# Patient Record
Sex: Female | Born: 1940 | Race: Black or African American | Hispanic: No | Smoking: Former smoker
Health system: Southern US, Community
[De-identification: ages and names within clinical notes are randomized; demographics above are authoritative.]

## PROBLEM LIST (undated history)

## (undated) DIAGNOSIS — I10 Essential (primary) hypertension: Secondary | ICD-10-CM

## (undated) DIAGNOSIS — M109 Gout, unspecified: Secondary | ICD-10-CM

## (undated) DIAGNOSIS — M1990 Unspecified osteoarthritis, unspecified site: Secondary | ICD-10-CM

## (undated) HISTORY — PX: TUBAL LIGATION: SHX77

## (undated) HISTORY — PX: ABDOMINAL HYSTERECTOMY: SHX81

---

## 2000-10-16 ENCOUNTER — Emergency Department (HOSPITAL_COMMUNITY): Admission: EM | Admit: 2000-10-16 | Discharge: 2000-10-16 | Payer: Self-pay | Admitting: Emergency Medicine

## 2000-11-13 ENCOUNTER — Ambulatory Visit (HOSPITAL_COMMUNITY): Admission: RE | Admit: 2000-11-13 | Discharge: 2000-11-13 | Payer: Self-pay | Admitting: Internal Medicine

## 2000-11-13 ENCOUNTER — Encounter: Payer: Self-pay | Admitting: Internal Medicine

## 2000-11-29 ENCOUNTER — Ambulatory Visit (HOSPITAL_COMMUNITY): Admission: RE | Admit: 2000-11-29 | Discharge: 2000-11-29 | Payer: Self-pay | Admitting: Internal Medicine

## 2000-11-29 ENCOUNTER — Encounter: Payer: Self-pay | Admitting: Internal Medicine

## 2000-12-28 ENCOUNTER — Other Ambulatory Visit: Admission: RE | Admit: 2000-12-28 | Discharge: 2000-12-28 | Payer: Self-pay | Admitting: Urology

## 2000-12-29 ENCOUNTER — Emergency Department (HOSPITAL_COMMUNITY): Admission: EM | Admit: 2000-12-29 | Discharge: 2000-12-29 | Payer: Self-pay | Admitting: *Deleted

## 2000-12-31 ENCOUNTER — Encounter: Payer: Self-pay | Admitting: Urology

## 2000-12-31 ENCOUNTER — Ambulatory Visit (HOSPITAL_COMMUNITY): Admission: RE | Admit: 2000-12-31 | Discharge: 2000-12-31 | Payer: Self-pay | Admitting: Urology

## 2001-01-19 ENCOUNTER — Emergency Department (HOSPITAL_COMMUNITY): Admission: EM | Admit: 2001-01-19 | Discharge: 2001-01-19 | Payer: Self-pay | Admitting: Emergency Medicine

## 2001-03-07 ENCOUNTER — Emergency Department (HOSPITAL_COMMUNITY): Admission: EM | Admit: 2001-03-07 | Discharge: 2001-03-07 | Payer: Self-pay | Admitting: Emergency Medicine

## 2001-04-18 ENCOUNTER — Emergency Department (HOSPITAL_COMMUNITY): Admission: EM | Admit: 2001-04-18 | Discharge: 2001-04-19 | Payer: Self-pay | Admitting: *Deleted

## 2001-04-19 ENCOUNTER — Encounter: Payer: Self-pay | Admitting: *Deleted

## 2002-04-09 ENCOUNTER — Encounter: Payer: Self-pay | Admitting: Internal Medicine

## 2002-04-09 ENCOUNTER — Ambulatory Visit (HOSPITAL_COMMUNITY): Admission: RE | Admit: 2002-04-09 | Discharge: 2002-04-09 | Payer: Self-pay | Admitting: Internal Medicine

## 2002-09-19 ENCOUNTER — Emergency Department (HOSPITAL_COMMUNITY): Admission: EM | Admit: 2002-09-19 | Discharge: 2002-09-19 | Payer: Self-pay | Admitting: *Deleted

## 2002-12-09 ENCOUNTER — Emergency Department (HOSPITAL_COMMUNITY): Admission: EM | Admit: 2002-12-09 | Discharge: 2002-12-09 | Payer: Self-pay

## 2003-01-10 ENCOUNTER — Emergency Department (HOSPITAL_COMMUNITY): Admission: EM | Admit: 2003-01-10 | Discharge: 2003-01-10 | Payer: Self-pay | Admitting: Emergency Medicine

## 2003-01-10 ENCOUNTER — Encounter: Payer: Self-pay | Admitting: Emergency Medicine

## 2004-01-07 IMAGING — CR DG CHEST 2V
2 series · 2 of 2 positions shown · non-contrast
Comparison: none

HISTORY: Cough

CHEST 2 VIEWS:
No prior studies currently available for comparison
Upper normal heart size.
Normal mediastinal contours and vascularity.
Azygos fissure noted.
Lungs clear.
No effusion, pneumothorax, or bone abnormality.

[view not recorded (1 of 2)]
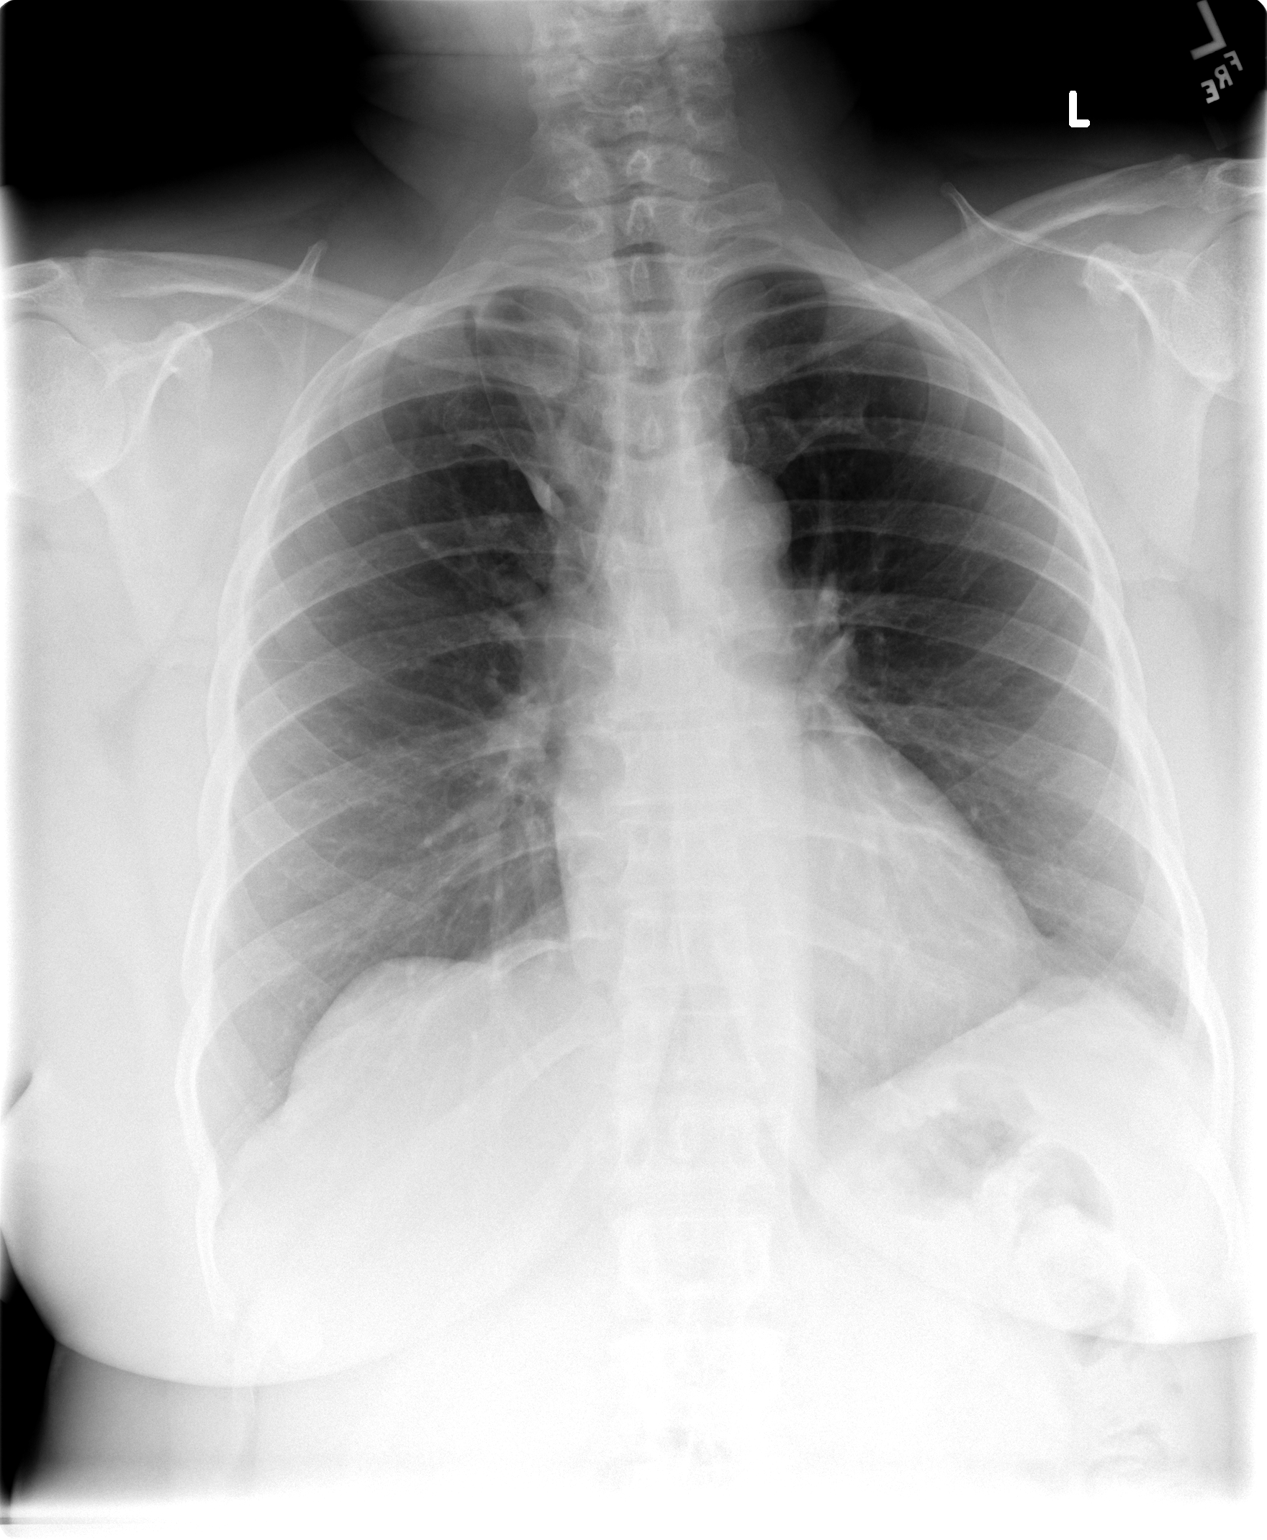

[view not recorded (2 of 2)]
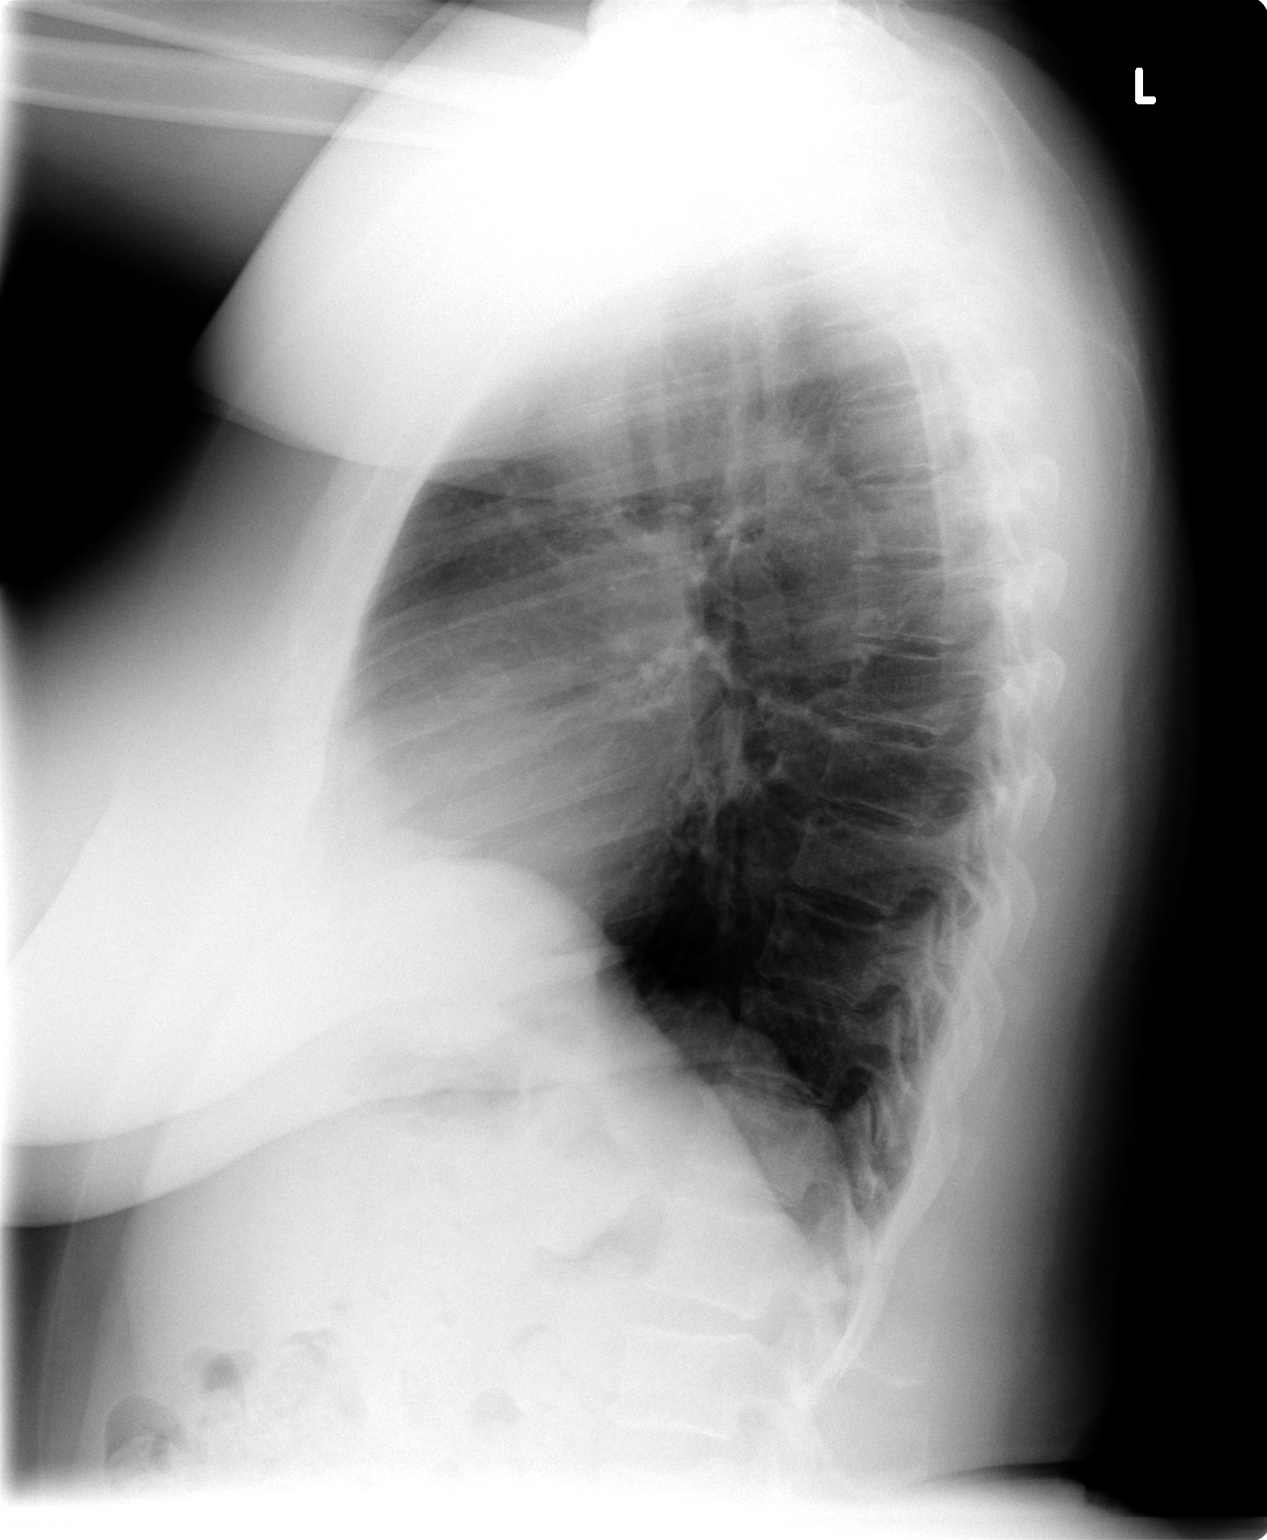

[2 of 2 positions shown; findings below may reference images not displayed]

IMPRESSION: No acute abnormalities.

## 2004-03-08 ENCOUNTER — Ambulatory Visit (HOSPITAL_COMMUNITY): Admission: RE | Admit: 2004-03-08 | Discharge: 2004-03-08 | Payer: Self-pay | Admitting: Family Medicine

## 2004-03-10 ENCOUNTER — Ambulatory Visit (HOSPITAL_COMMUNITY): Admission: RE | Admit: 2004-03-10 | Discharge: 2004-03-10 | Payer: Self-pay | Admitting: Family Medicine

## 2004-03-10 IMAGING — CT CT ABDOMEN W/ CM
1 of 3 series · 13 of 32 positions shown, 19 images · IV contrast (CONTRAST)
Comparison: none

CLINICAL DATA: Generalized abdominal pain progressive for months.
TECHNIQUE: Multidetector CT imaging of the abdomen and pelvis was performed during administration of 100 cc Omnipaque 300 intravenous contrast.  Oral contrast was also administered.  
 ABDOMEN CT WITH CONTRAST:
 The lung bases are clear.  The liver, gallbladder, spleen, pancreas, adrenal glands, and kidneys are normal in appearance.   No abnormal soft tissue masses are seen.  There is no evidence of adenopathy.  There is no evidence of inflammatory process or abnormal fluid collections.  The abdominal bowel loops are unremarkable.

[Series 1979: — · axial · 0.72mm/px · z∈[+1196,+1572]mm · 13 of 87 slices shown, 19 images]
[im 6/87  soft-tissue]
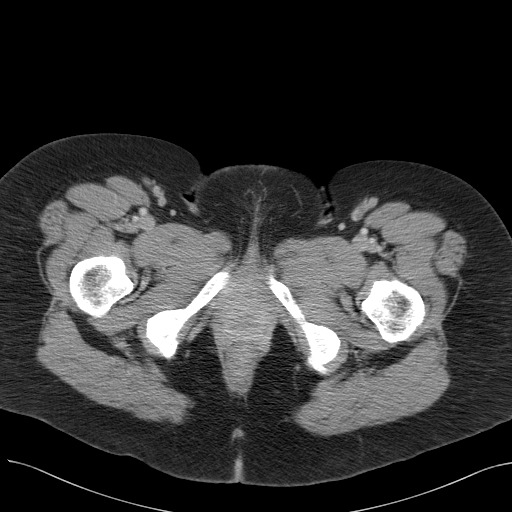
[im 6/87  bone]
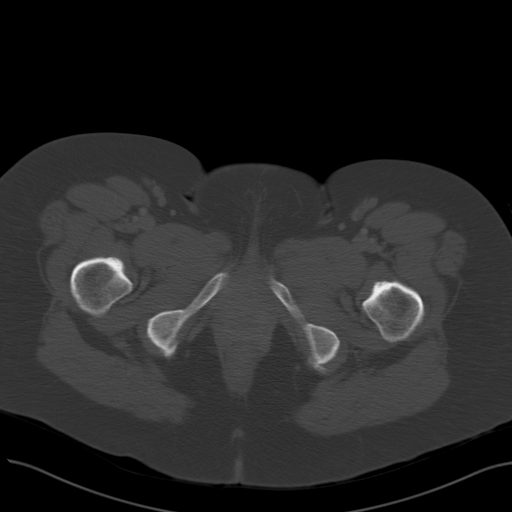
[im 12/87  soft-tissue]
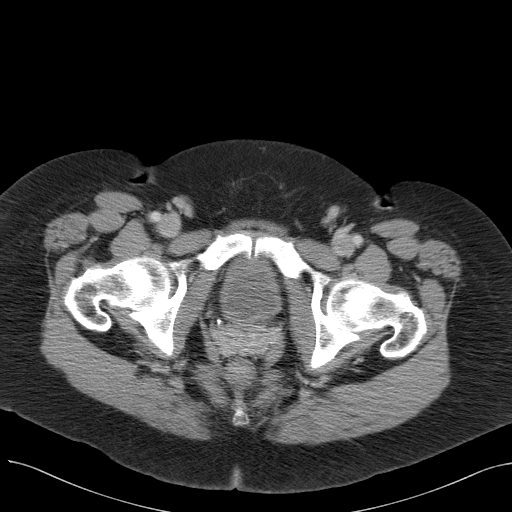
[im 18/87  soft-tissue]
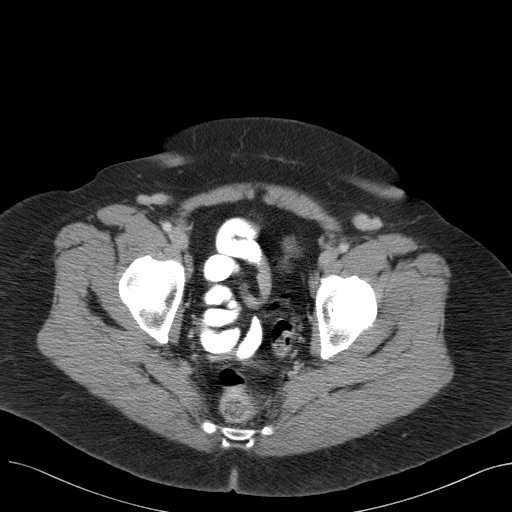
[im 23/87  soft-tissue]
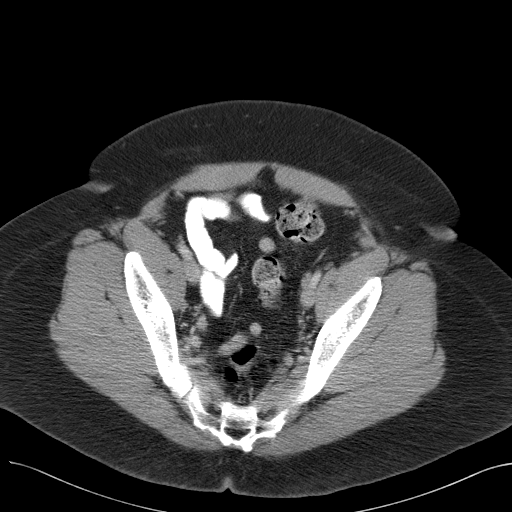
[im 29/87  soft-tissue]
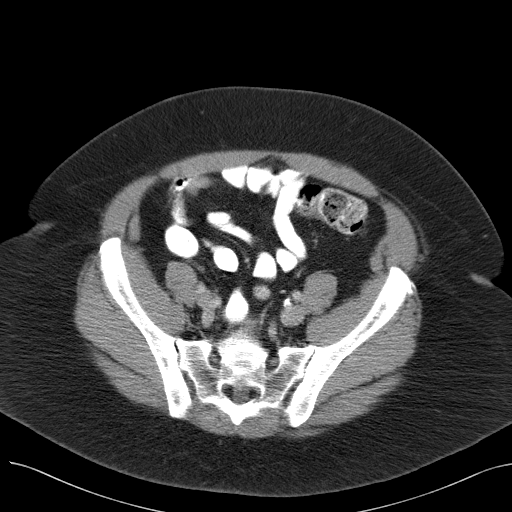
[im 35/87  soft-tissue]
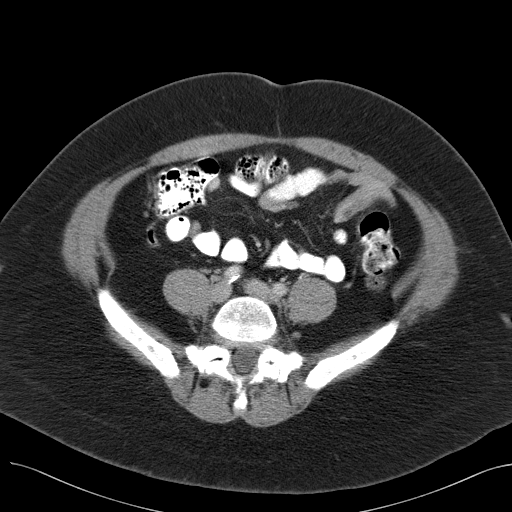
[im 46/87  soft-tissue]
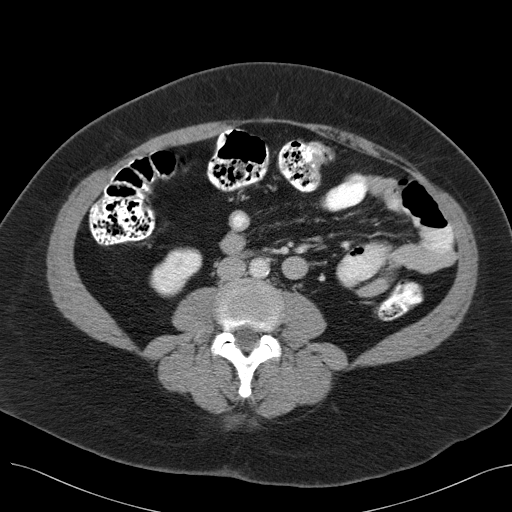
[im 52/87  soft-tissue]
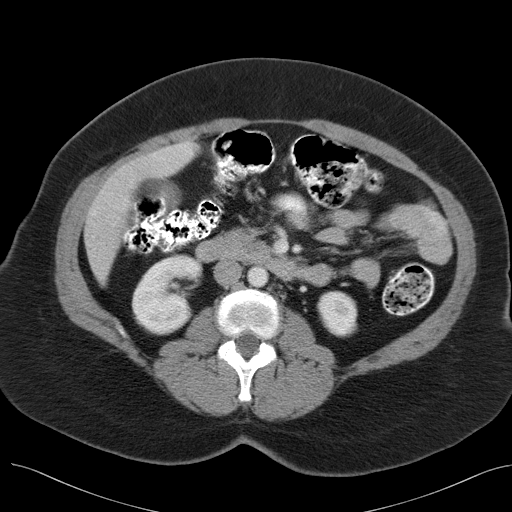
[im 58/87  soft-tissue]
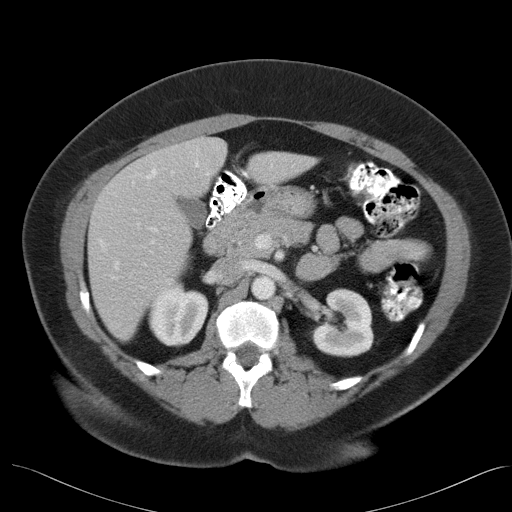
[im 58/87  bone]
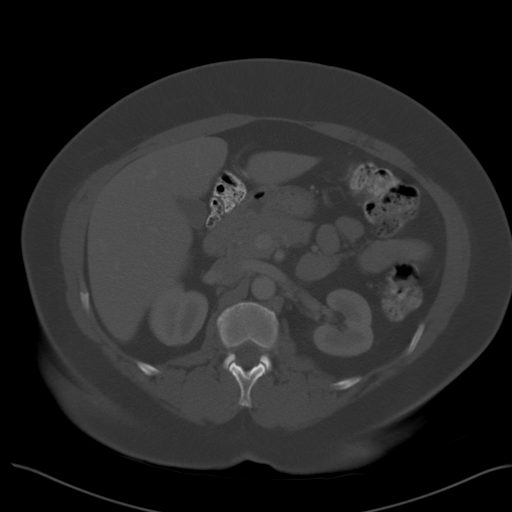
[im 64/87  soft-tissue]
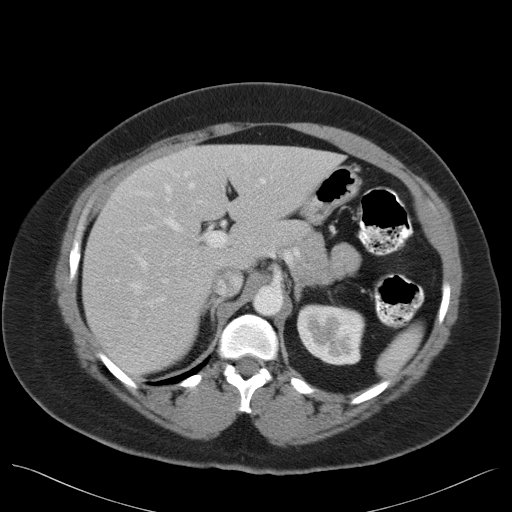
[im 64/87  lung]
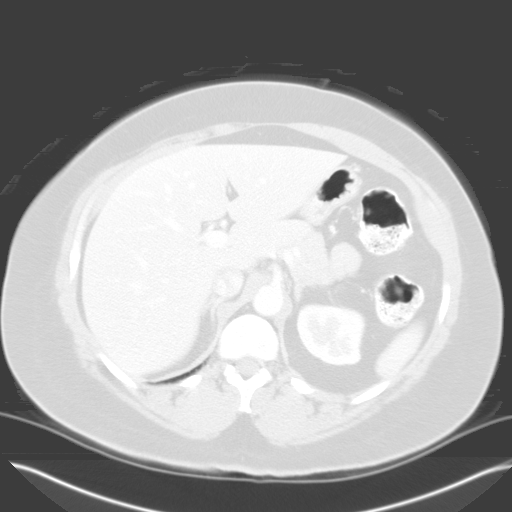
[im 69/87  soft-tissue]
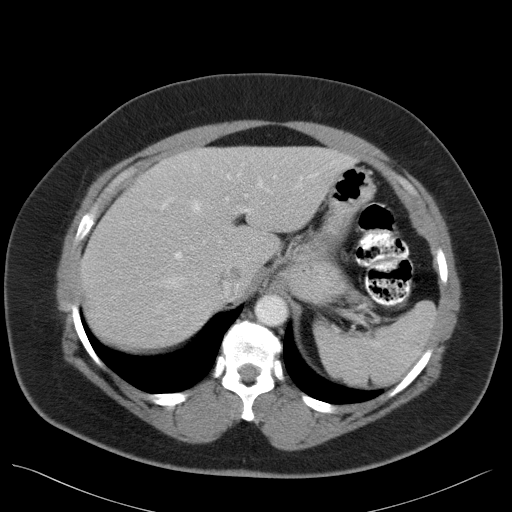
[im 69/87  lung]
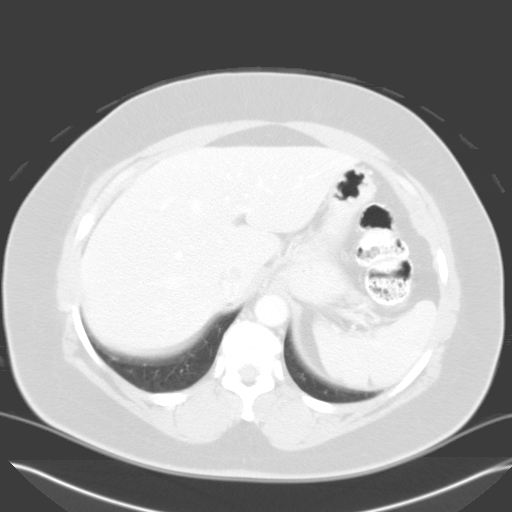
[im 75/87  soft-tissue]
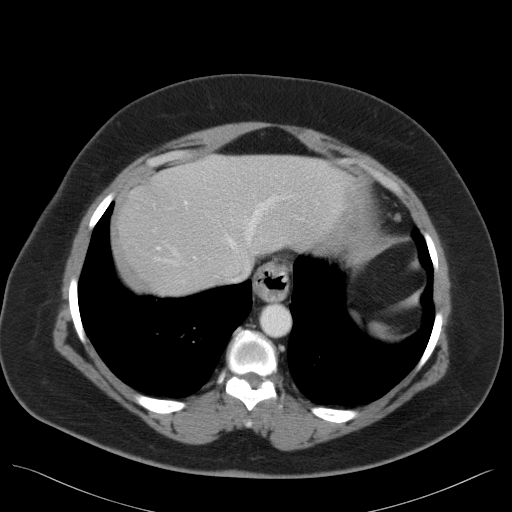
[im 75/87  lung]
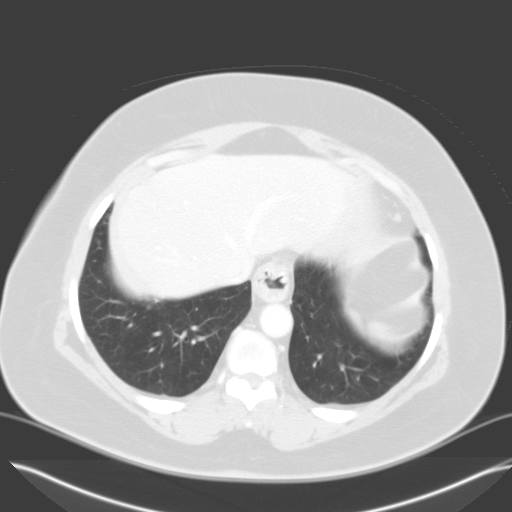
[im 81/87  soft-tissue]
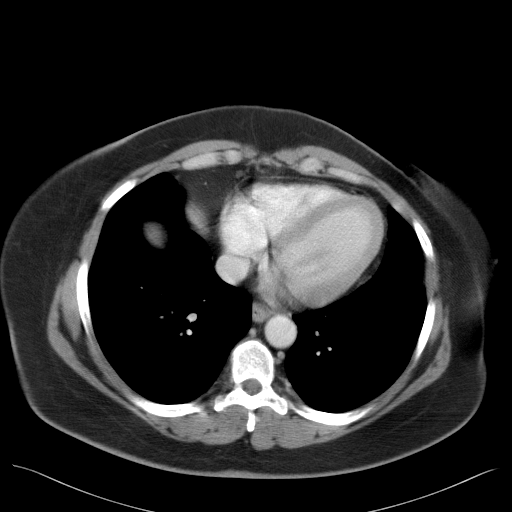
[im 81/87  lung]
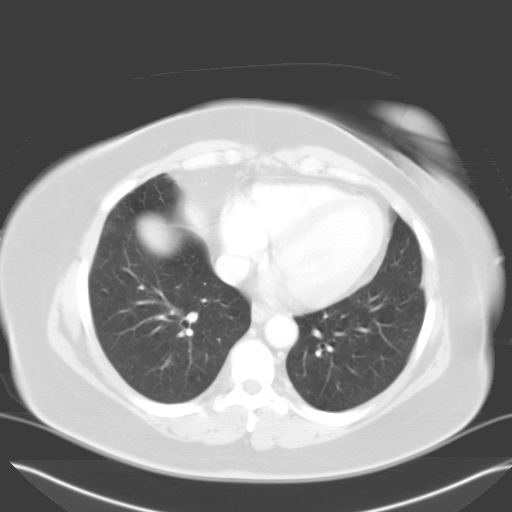

[13 of 32 positions shown; findings below may reference images not displayed]

IMPRESSION: Negative abdomen CT. 
 PELVIS CT WITH CONTRAST:
 There is no evidence of pelvic masses or adenopathy.   There is no evidence of inflammatory process or abnormal fluid collections.  The bowel loops are unremarkable in appearance.   The uterus is absent.
IMPRESSION: Negative pelvis CT.

## 2005-05-20 ENCOUNTER — Emergency Department (HOSPITAL_COMMUNITY): Admission: EM | Admit: 2005-05-20 | Discharge: 2005-05-20 | Payer: Self-pay | Admitting: Emergency Medicine

## 2005-07-01 ENCOUNTER — Emergency Department (HOSPITAL_COMMUNITY): Admission: EM | Admit: 2005-07-01 | Discharge: 2005-07-01 | Payer: Self-pay | Admitting: Emergency Medicine

## 2005-09-06 ENCOUNTER — Ambulatory Visit (HOSPITAL_COMMUNITY): Admission: RE | Admit: 2005-09-06 | Discharge: 2005-09-06 | Payer: Self-pay | Admitting: General Surgery

## 2005-09-06 ENCOUNTER — Encounter (INDEPENDENT_AMBULATORY_CARE_PROVIDER_SITE_OTHER): Payer: Self-pay | Admitting: Internal Medicine

## 2006-03-07 ENCOUNTER — Ambulatory Visit: Payer: Self-pay | Admitting: Internal Medicine

## 2006-03-13 ENCOUNTER — Encounter: Payer: Self-pay | Admitting: Internal Medicine

## 2006-03-13 DIAGNOSIS — M129 Arthropathy, unspecified: Secondary | ICD-10-CM | POA: Insufficient documentation

## 2006-03-13 DIAGNOSIS — I10 Essential (primary) hypertension: Secondary | ICD-10-CM

## 2006-03-13 DIAGNOSIS — M1990 Unspecified osteoarthritis, unspecified site: Secondary | ICD-10-CM | POA: Insufficient documentation

## 2006-03-13 DIAGNOSIS — J309 Allergic rhinitis, unspecified: Secondary | ICD-10-CM | POA: Insufficient documentation

## 2006-10-31 ENCOUNTER — Ambulatory Visit: Payer: Self-pay | Admitting: Internal Medicine

## 2006-10-31 DIAGNOSIS — L03319 Cellulitis of trunk, unspecified: Secondary | ICD-10-CM

## 2006-10-31 DIAGNOSIS — L02219 Cutaneous abscess of trunk, unspecified: Secondary | ICD-10-CM

## 2006-10-31 DIAGNOSIS — M62838 Other muscle spasm: Secondary | ICD-10-CM

## 2006-11-01 ENCOUNTER — Encounter (INDEPENDENT_AMBULATORY_CARE_PROVIDER_SITE_OTHER): Payer: Self-pay | Admitting: Internal Medicine

## 2006-11-02 ENCOUNTER — Encounter (INDEPENDENT_AMBULATORY_CARE_PROVIDER_SITE_OTHER): Payer: Self-pay | Admitting: Internal Medicine

## 2006-11-05 ENCOUNTER — Encounter (INDEPENDENT_AMBULATORY_CARE_PROVIDER_SITE_OTHER): Payer: Self-pay | Admitting: Internal Medicine

## 2006-11-05 ENCOUNTER — Telehealth (INDEPENDENT_AMBULATORY_CARE_PROVIDER_SITE_OTHER): Payer: Self-pay | Admitting: *Deleted

## 2006-11-05 LAB — CONVERTED CEMR LAB
BUN: 9 mg/dL (ref 6–23)
Basophils Absolute: 0 10*3/uL (ref 0.0–0.1)
Basophils Relative: 1 % (ref 0–1)
CO2: 21 meq/L (ref 19–32)
Calcium: 9.4 mg/dL (ref 8.4–10.5)
Chloride: 107 meq/L (ref 96–112)
Cholesterol: 170 mg/dL (ref 0–200)
Creatinine, Ser: 1.15 mg/dL (ref 0.40–1.20)
Eosinophils Absolute: 0.2 10*3/uL (ref 0.0–0.7)
Eosinophils Relative: 3 % (ref 0–5)
HCT: 42.7 % (ref 36.0–46.0)
HDL: 42 mg/dL (ref >39)
Hemoglobin: 14.2 g/dL (ref 12.0–15.0)
LDL Cholesterol: 107 mg/dL — ABNORMAL HIGH (ref 0–99)
Lymphocytes Relative: 36 % (ref 12–46)
MCHC: 33.3 g/dL (ref 30.0–36.0)
MCV: 82.1 fL (ref 78.0–100.0)
Monocytes Absolute: 0.4 10*3/uL (ref 0.2–0.7)
Monocytes Relative: 6 % (ref 3–11)
Neutrophils Relative %: 54 % (ref 43–77)
RDW: 16 % — ABNORMAL HIGH (ref 11.5–14.0)
Sodium: 141 meq/L (ref 135–145)
Triglycerides: 107 mg/dL (ref <150)
VLDL: 21 mg/dL (ref 0–40)
WBC: 6.2 10*3/uL (ref 4.0–10.5)

## 2007-02-19 ENCOUNTER — Emergency Department (HOSPITAL_COMMUNITY): Admission: EM | Admit: 2007-02-19 | Discharge: 2007-02-19 | Payer: Self-pay | Admitting: Emergency Medicine

## 2007-02-19 IMAGING — CR DG SHOULDER 2+V*R*
3 series · 3 of 3 positions shown · non-contrast
Comparison: none

CLINICAL DATA: Shoulder pain.

RIGHT SHOULDER - 3 VIEW

[view not recorded (1 of 3)]
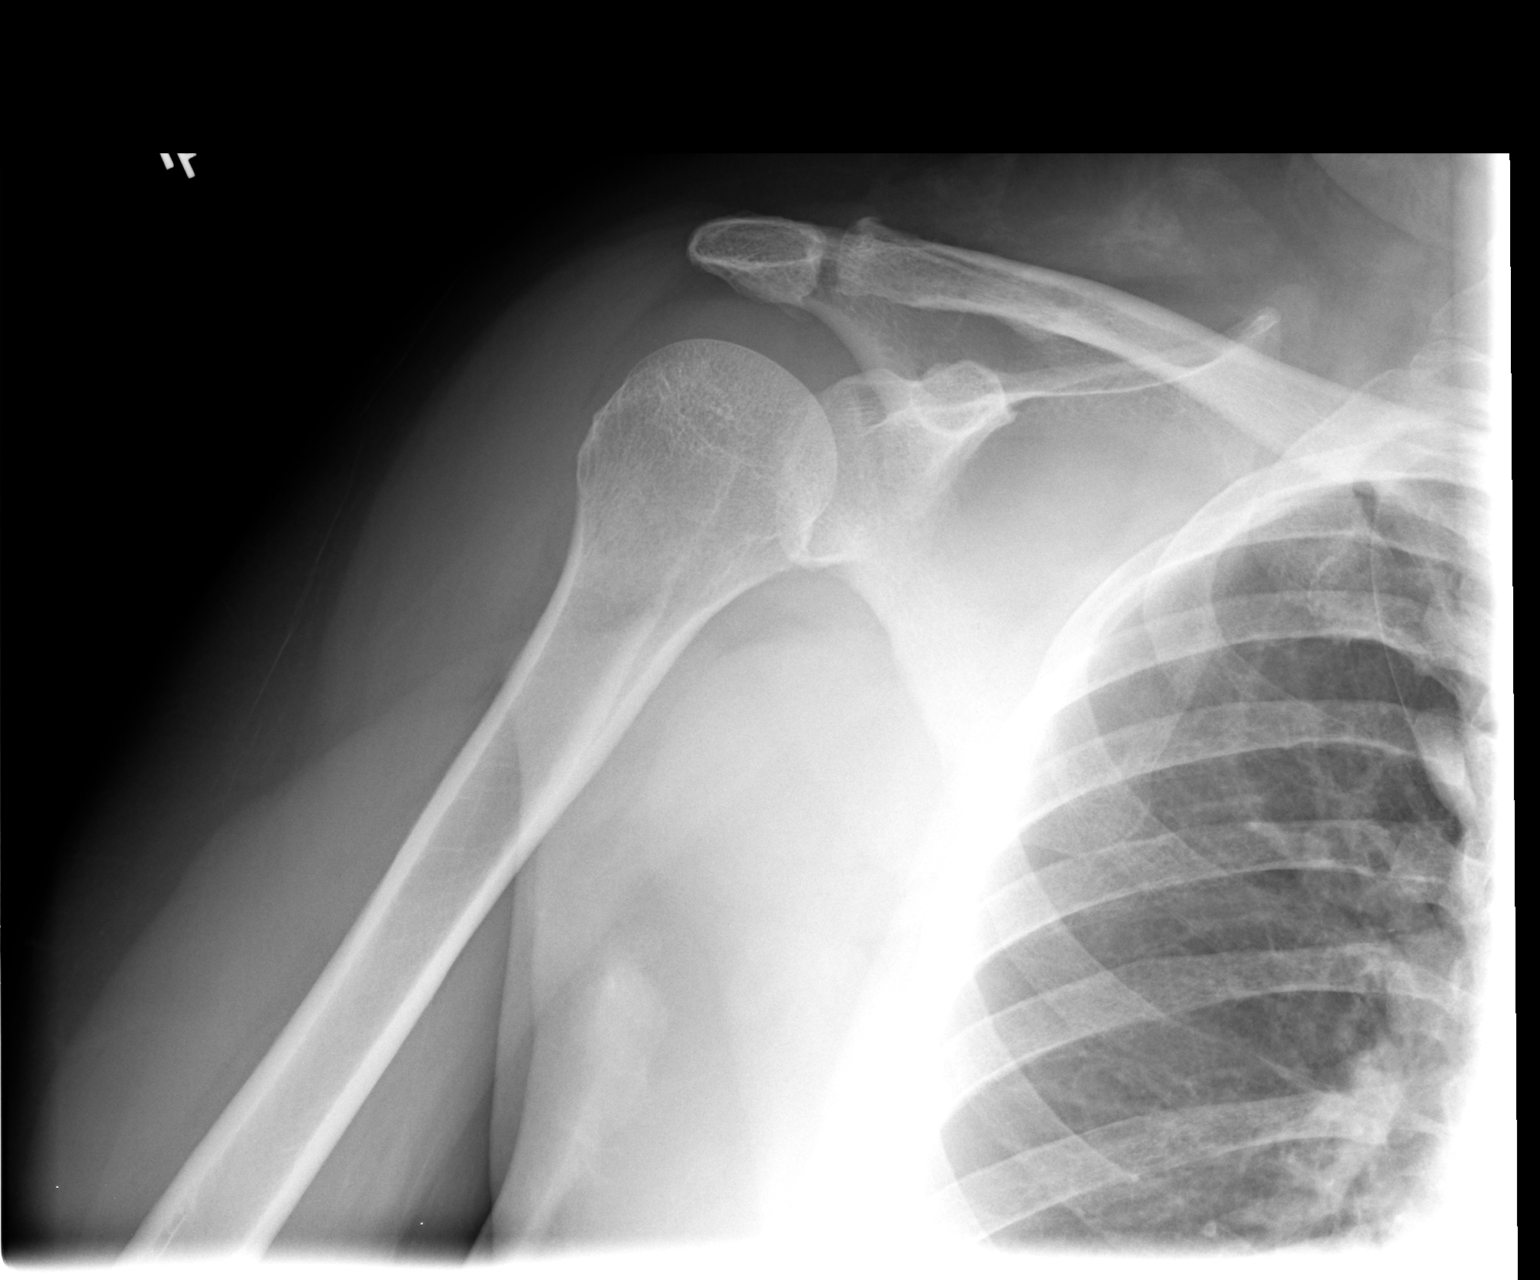

[view not recorded (2 of 3)]
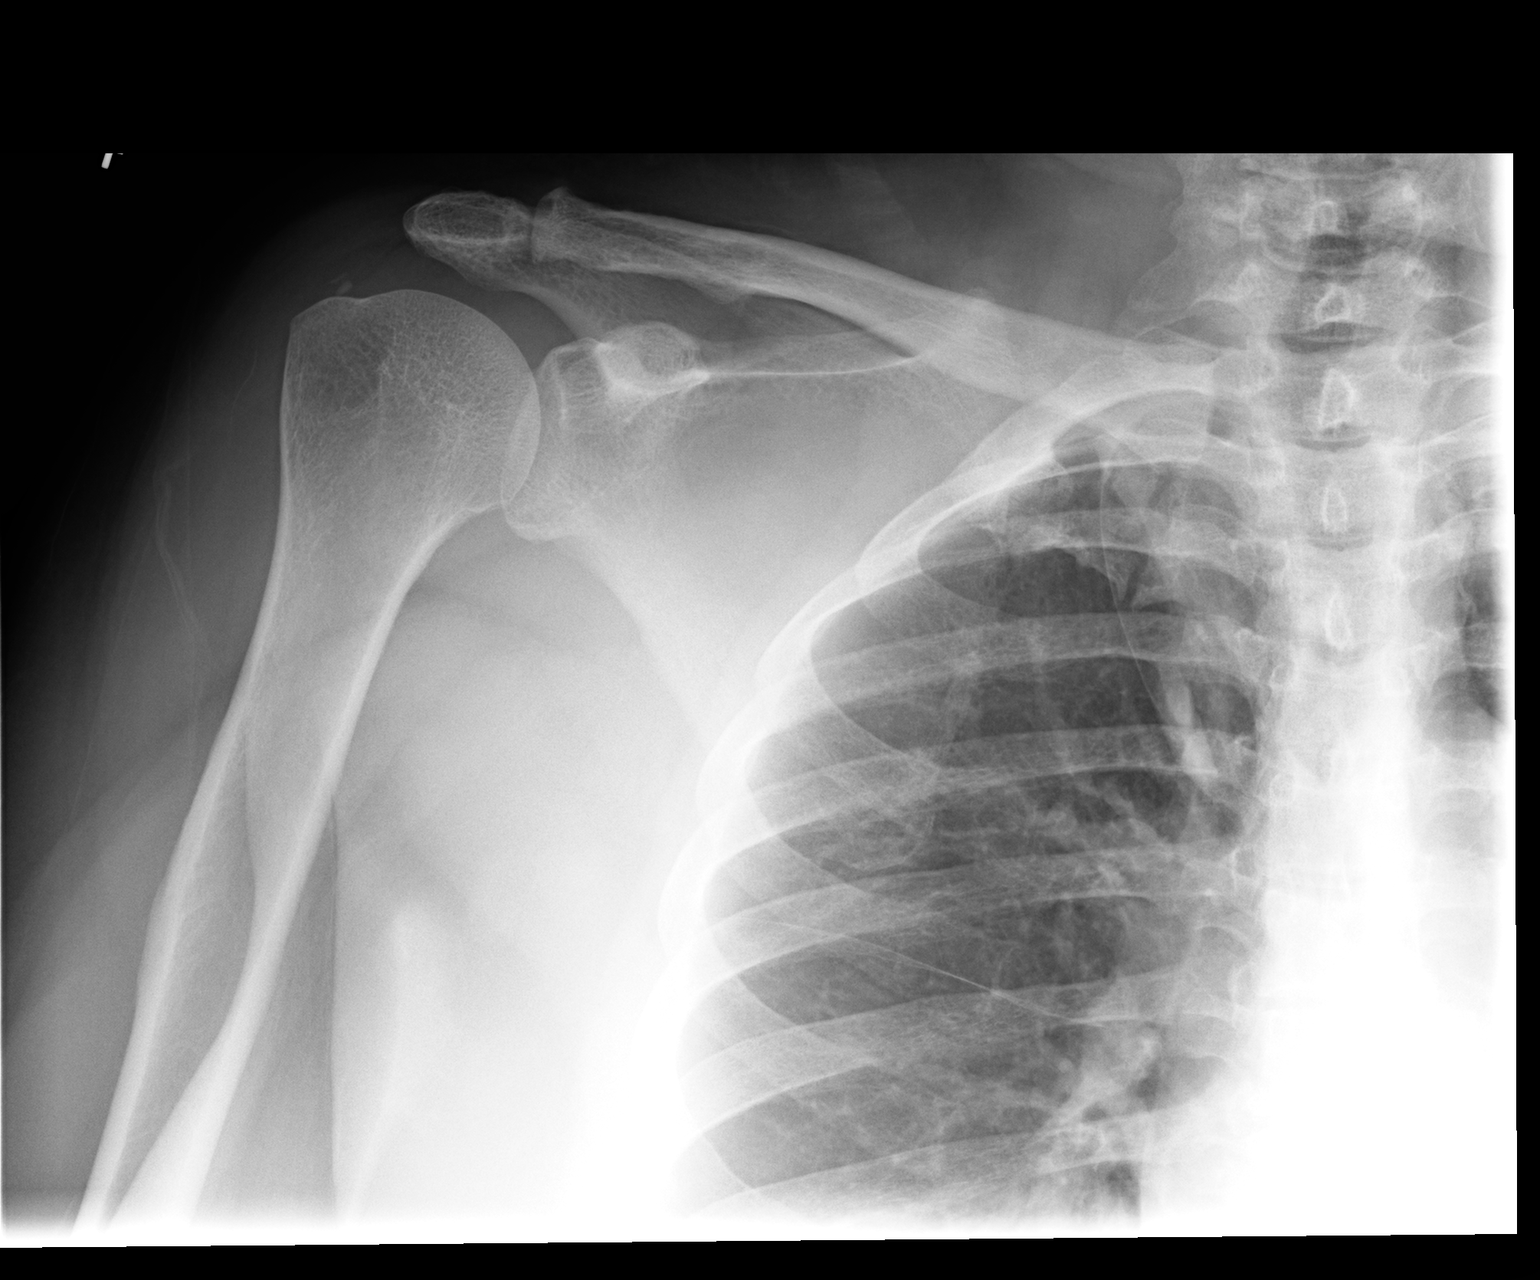

[view not recorded (3 of 3)]
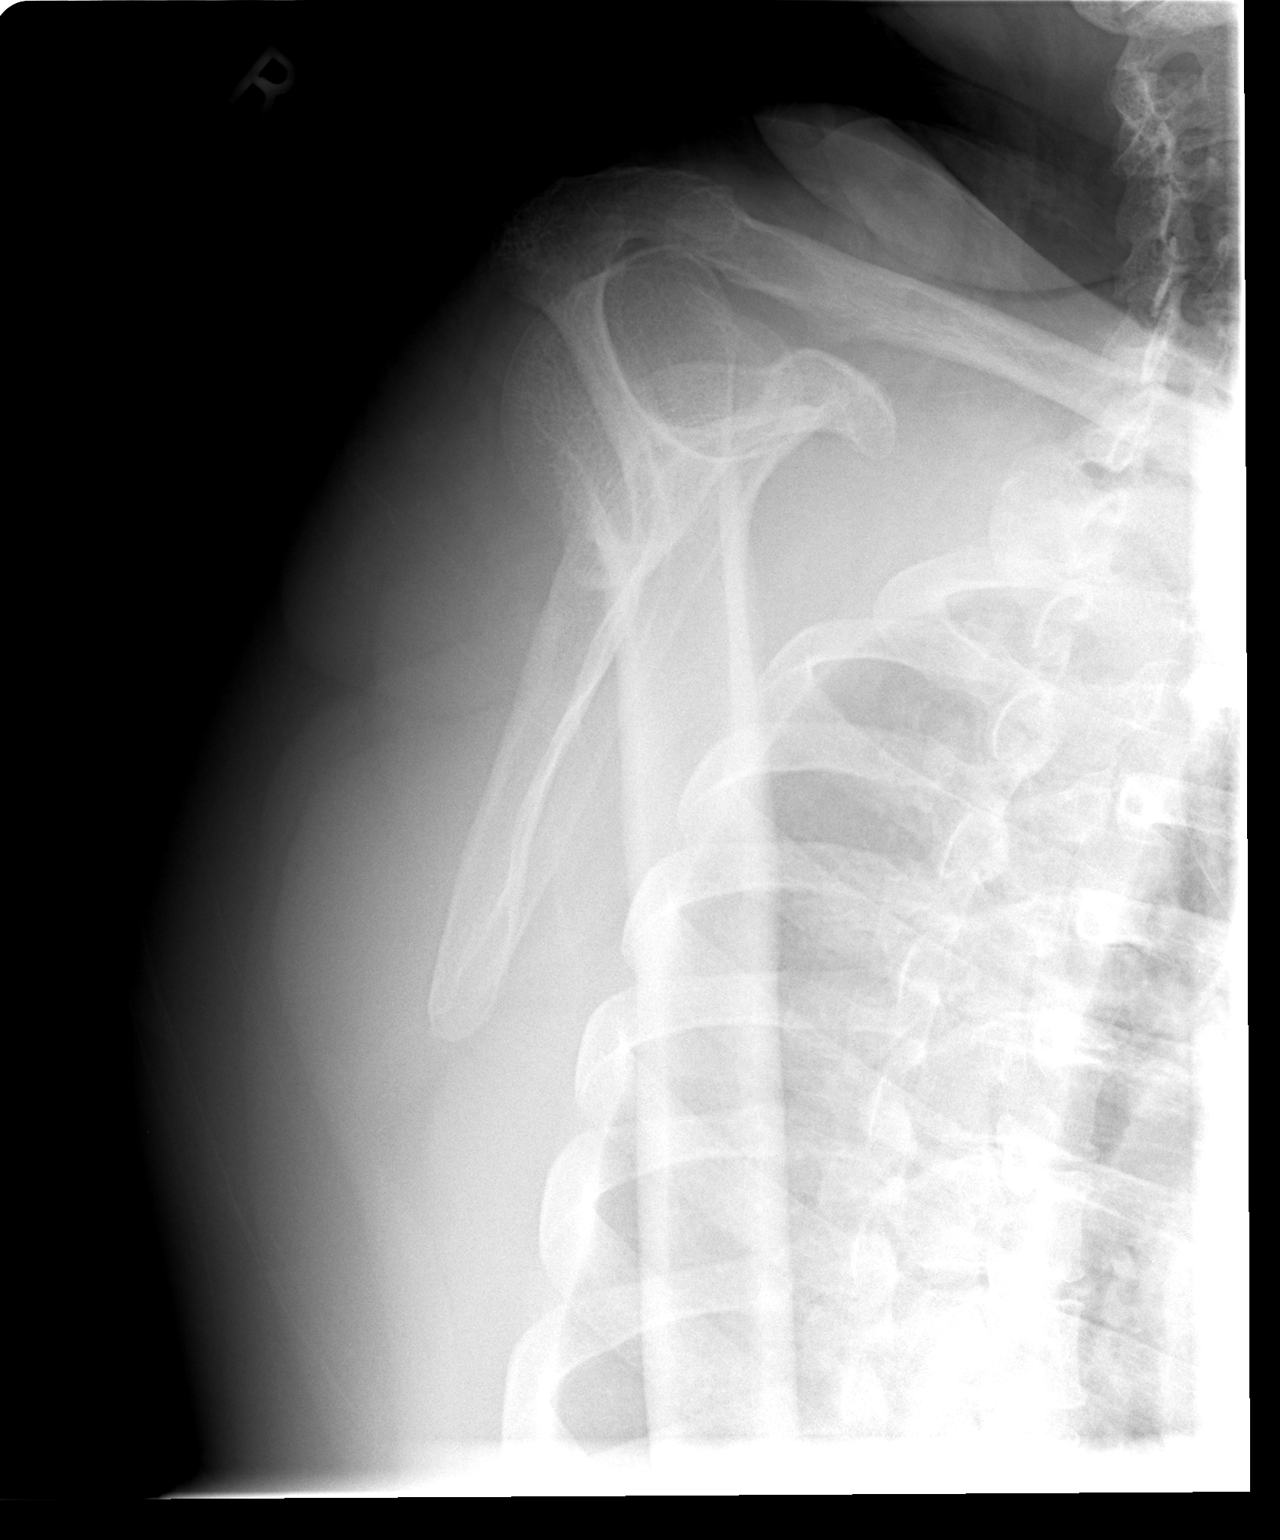

[3 of 3 positions shown; findings below may reference images not displayed]

FINDINGS: Shoulder is located. Calcific tendinitis of the rotator cuff. Mild AC
joint osteoarthritis. No fracture. Incidental note made of azygos fissure.

IMPRESSION

1. No acute abnormalities.
2. Calcific tendinitis and AC joint osteoarthritis.

## 2007-04-09 ENCOUNTER — Emergency Department (HOSPITAL_COMMUNITY): Admission: EM | Admit: 2007-04-09 | Discharge: 2007-04-09 | Payer: Self-pay | Admitting: Emergency Medicine

## 2007-04-09 LAB — CONVERTED CEMR LAB
BUN: 11 mg/dL
Basophils Relative: 0 %
CO2: 27 meq/L
Calcium: 9.2 mg/dL
Chloride: 103 meq/L
Creatinine, Ser: 1.59 mg/dL
Eosinophils Relative: 4 %
Glucose, Bld: 123 mg/dL
Lymphocytes Relative: 10 %
MCV: 82 fL
Monocytes Relative: 6 %
Neutrophils Relative %: 80 %
Platelets: 188 10*3/uL
RDW: 16.2 %
Sodium: 138 meq/L
WBC: 9.8 10*3/uL

## 2007-04-09 IMAGING — CR DG CHEST 2V
2 series · 2 of 2 positions shown · non-contrast
Comparison: [DATE].

CLINICAL DATA: Dyspnea and congestion for one day. 
 CHEST - 2 VIEW:

[view not recorded (1 of 2)]
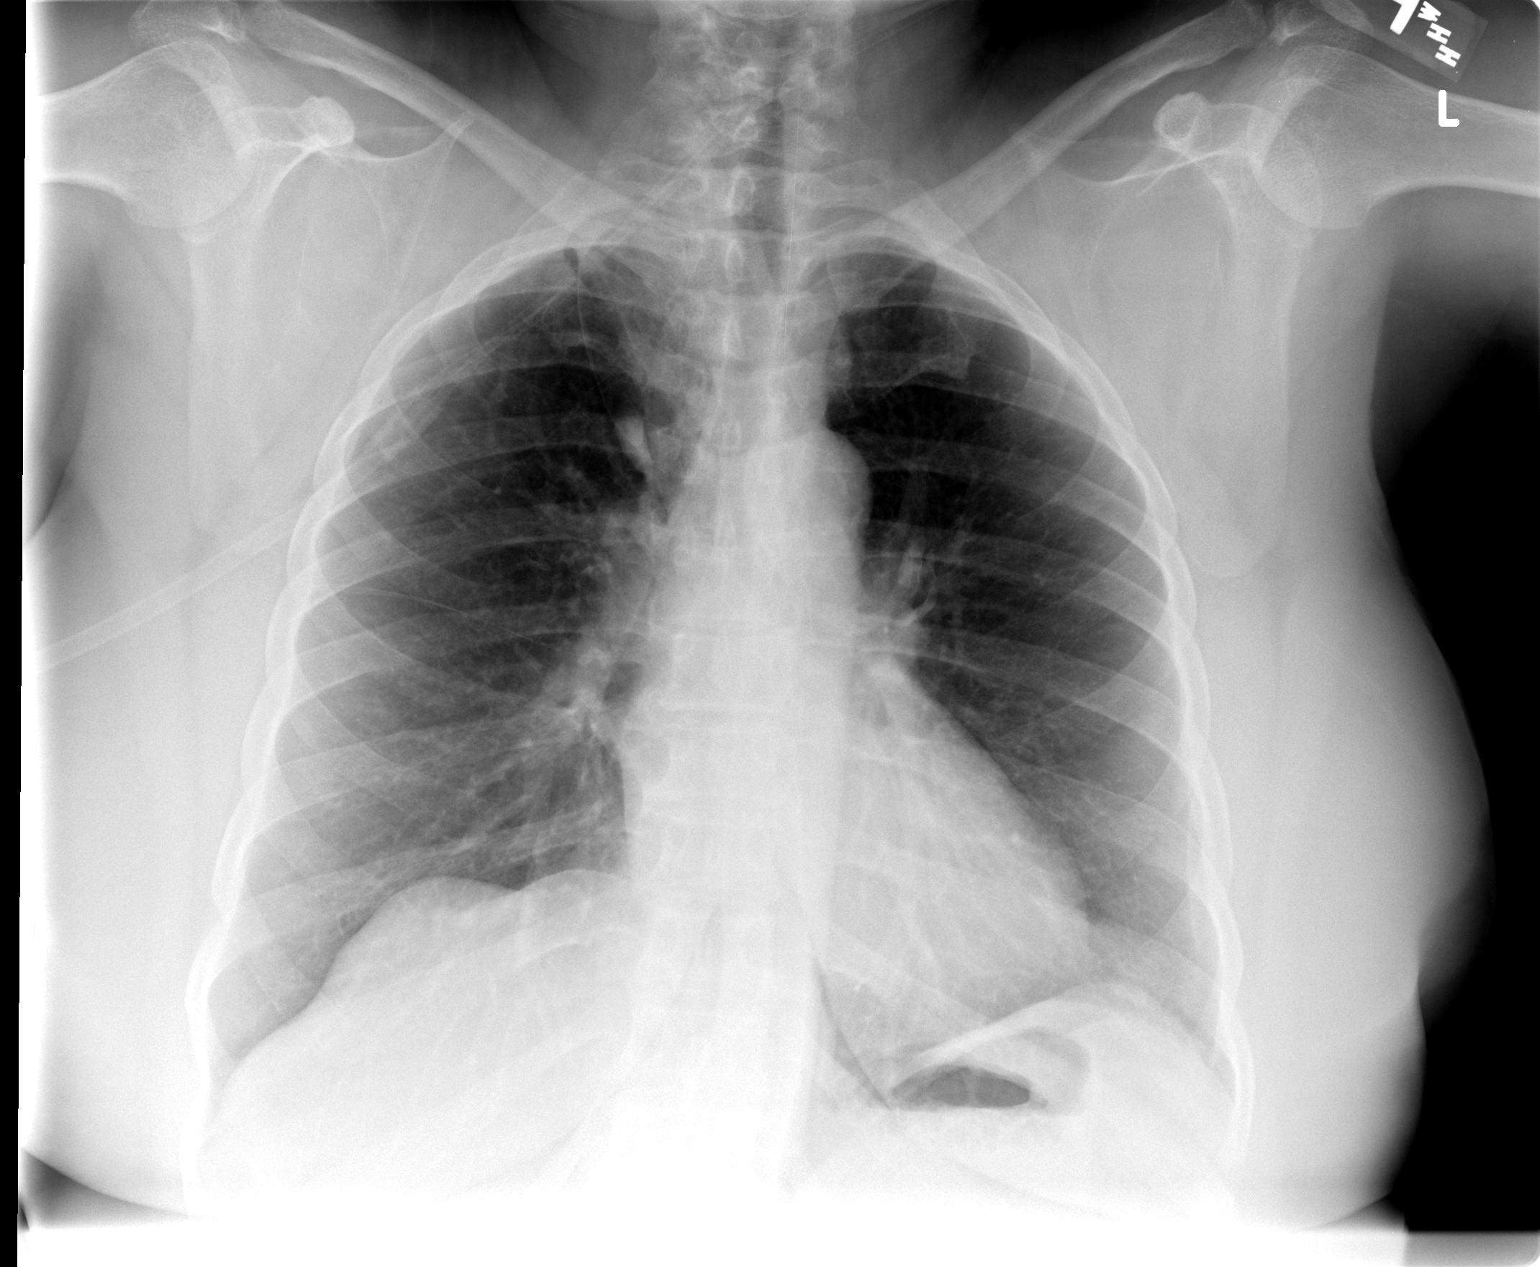

[view not recorded (2 of 2)]
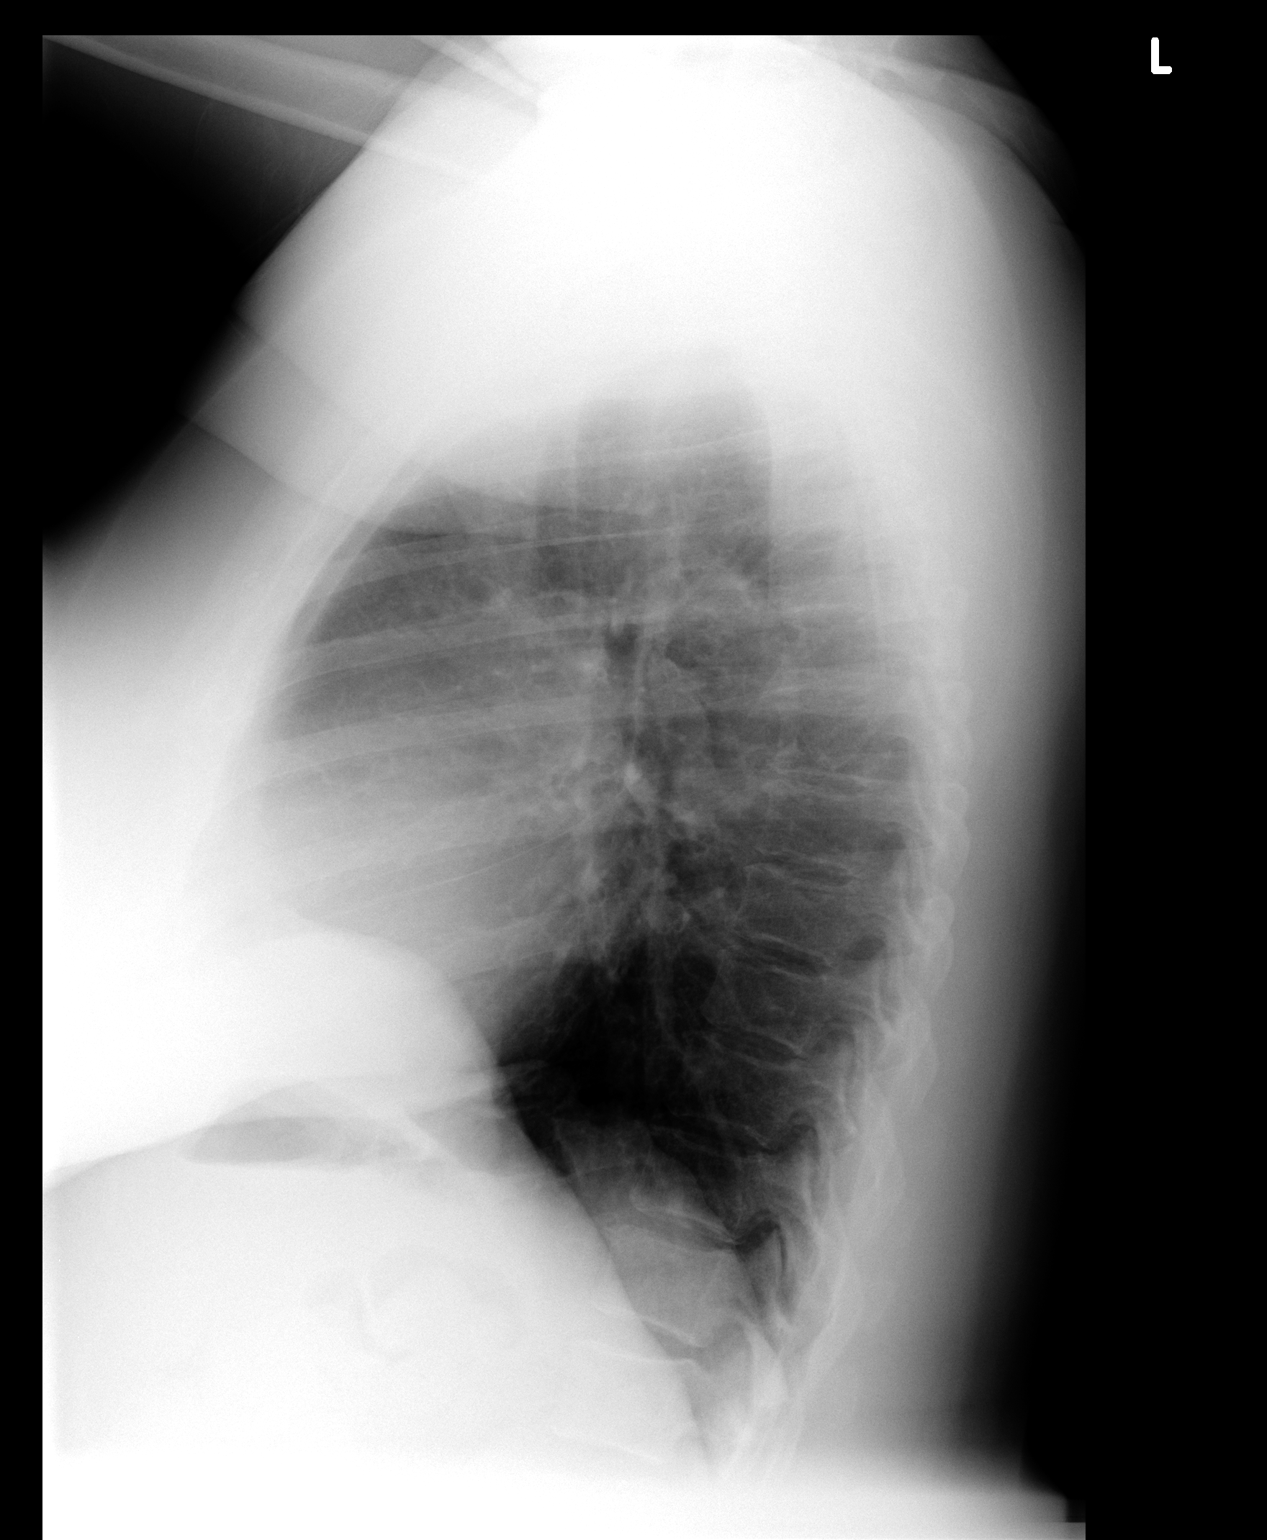

[2 of 2 positions shown; findings below may reference images not displayed]

FINDINGS: The heart size and mediastinal contours are stable.  The lungs are clear.  The azygous fissure is noted.  There is no pleural effusion or acute osseous abnormality.
IMPRESSION: Stable examination.  No evidence for pneumonia or other active cardiopulmonary process.

## 2007-04-16 ENCOUNTER — Ambulatory Visit: Payer: Self-pay | Admitting: Internal Medicine

## 2007-04-16 DIAGNOSIS — J209 Acute bronchitis, unspecified: Secondary | ICD-10-CM

## 2007-04-22 ENCOUNTER — Encounter (INDEPENDENT_AMBULATORY_CARE_PROVIDER_SITE_OTHER): Payer: Self-pay | Admitting: Internal Medicine

## 2007-04-25 ENCOUNTER — Encounter: Payer: Self-pay | Admitting: Family Medicine

## 2007-05-08 ENCOUNTER — Ambulatory Visit: Payer: Self-pay | Admitting: Internal Medicine

## 2007-05-08 DIAGNOSIS — G4700 Insomnia, unspecified: Secondary | ICD-10-CM

## 2007-06-07 ENCOUNTER — Ambulatory Visit: Payer: Self-pay | Admitting: Family Medicine

## 2007-07-26 ENCOUNTER — Ambulatory Visit: Payer: Self-pay | Admitting: Internal Medicine

## 2007-08-26 ENCOUNTER — Encounter (INDEPENDENT_AMBULATORY_CARE_PROVIDER_SITE_OTHER): Payer: Self-pay | Admitting: Internal Medicine

## 2007-08-27 ENCOUNTER — Encounter (INDEPENDENT_AMBULATORY_CARE_PROVIDER_SITE_OTHER): Payer: Self-pay | Admitting: Internal Medicine

## 2007-10-07 ENCOUNTER — Encounter (INDEPENDENT_AMBULATORY_CARE_PROVIDER_SITE_OTHER): Payer: Self-pay | Admitting: Internal Medicine

## 2008-11-18 ENCOUNTER — Ambulatory Visit (HOSPITAL_COMMUNITY): Admission: RE | Admit: 2008-11-18 | Discharge: 2008-11-18 | Payer: Self-pay | Admitting: Family Medicine

## 2010-05-24 NOTE — Letter (Signed)
Summary: rmsa chart  rmsa chart   Imported By: Eliezer Mccoy 01/21/2010 14:07:27  _____________________________________________________________________  External Attachment:    Type:   Image     Comment:   External Document

## 2010-08-20 ENCOUNTER — Emergency Department (HOSPITAL_COMMUNITY)
Admission: EM | Admit: 2010-08-20 | Discharge: 2010-08-20 | Disposition: A | Discharge status: Home / Self Care | Payer: Medicare Other | Attending: Emergency Medicine | Admitting: Emergency Medicine

## 2010-08-20 ENCOUNTER — Emergency Department (HOSPITAL_COMMUNITY): Payer: Medicare Other

## 2010-08-20 DIAGNOSIS — I10 Essential (primary) hypertension: Secondary | ICD-10-CM | POA: Insufficient documentation

## 2010-08-20 DIAGNOSIS — M109 Gout, unspecified: Secondary | ICD-10-CM | POA: Insufficient documentation

## 2010-08-20 DIAGNOSIS — Z79899 Other long term (current) drug therapy: Secondary | ICD-10-CM | POA: Insufficient documentation

## 2010-08-20 LAB — DIFFERENTIAL
Eosinophils Absolute: 0.3 10*3/uL (ref 0.0–0.7)
Eosinophils Relative: 4 % (ref 0–5)
Lymphocytes Relative: 32 % (ref 12–46)
Lymphs Abs: 2 10*3/uL (ref 0.7–4.0)
Monocytes Absolute: 0.4 10*3/uL (ref 0.1–1.0)
Monocytes Relative: 6 % (ref 3–12)
Neutro Abs: 3.6 10*3/uL (ref 1.7–7.7)
Neutrophils Relative %: 58 % (ref 43–77)

## 2010-08-20 LAB — CBC
HCT: 40.6 % (ref 36.0–46.0)
MCH: 27.1 pg (ref 26.0–34.0)
MCHC: 32.5 g/dL (ref 30.0–36.0)
MCV: 83.4 fL (ref 78.0–100.0)
Platelets: 198 10*3/uL (ref 150–400)
RBC: 4.87 MIL/uL (ref 3.87–5.11)
RDW: 15.9 % — ABNORMAL HIGH (ref 11.5–15.5)
WBC: 6.2 10*3/uL (ref 4.0–10.5)

## 2010-08-20 LAB — URIC ACID: Uric Acid, Serum: 7.9 mg/dL — ABNORMAL HIGH (ref 2.4–7.0)

## 2010-08-20 LAB — BASIC METABOLIC PANEL
BUN: 16 mg/dL (ref 6–23)
CO2: 27 mEq/L (ref 19–32)
Calcium: 8.8 mg/dL (ref 8.4–10.5)
Chloride: 105 mEq/L (ref 96–112)
Creatinine, Ser: 1.22 mg/dL — ABNORMAL HIGH (ref 0.4–1.2)
GFR calc non Af Amer: 44 mL/min — ABNORMAL LOW (ref >60)
Potassium: 4.1 mEq/L (ref 3.5–5.1)
Sodium: 138 mEq/L (ref 135–145)

## 2010-08-20 IMAGING — CR DG FOOT COMPLETE 3+V*R*
3 series · 3 of 3 positions shown · non-contrast
Comparison: None.

CLINICAL DATA: Right foot pain, no injury

RIGHT FOOT COMPLETE - 3+ VIEW

[view not recorded (1 of 3)]
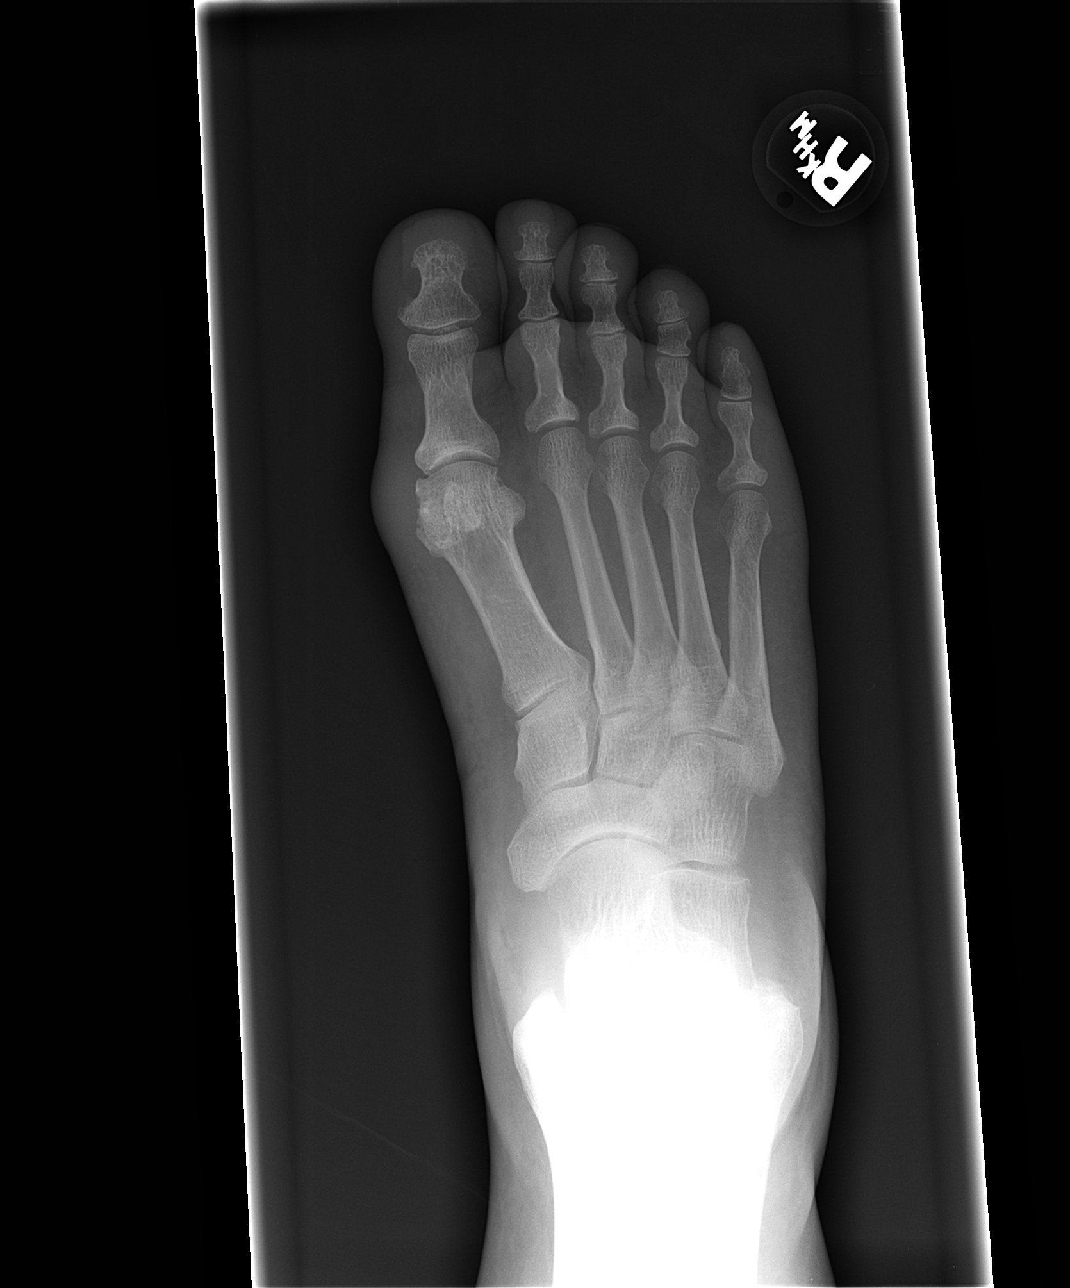

[view not recorded (2 of 3)]
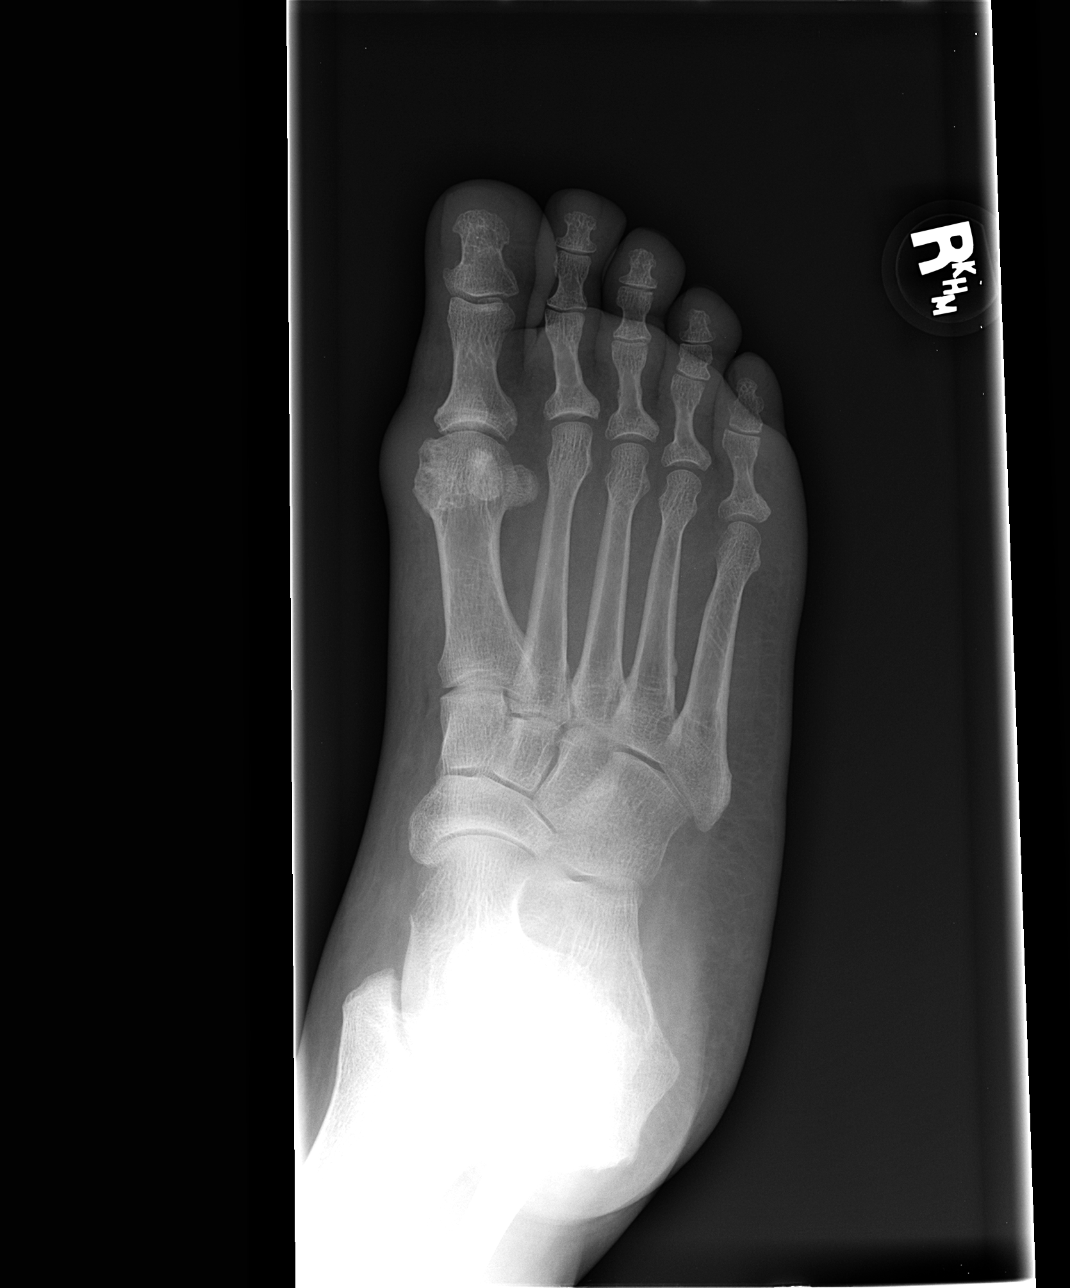

[view not recorded (3 of 3)]
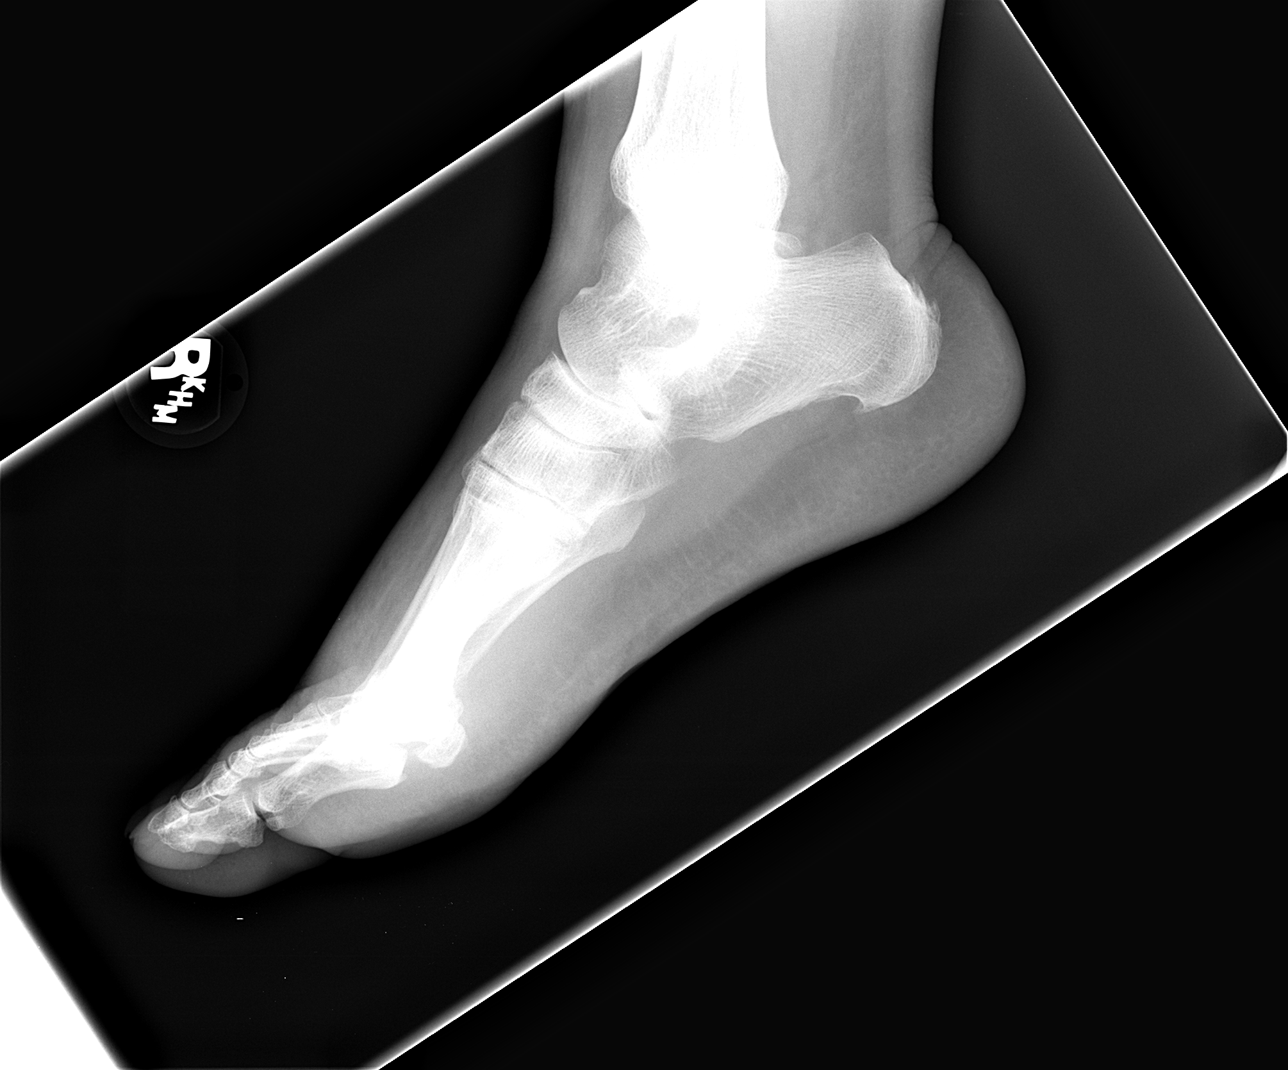

[3 of 3 positions shown; findings below may reference images not displayed]

FINDINGS: There is mild degenerative change of the right first MTP
joint.  There is some irregularity of the right first metatarsal
head with adjacent soft tissue swelling and gout would be a
consideration.  No definite erosion is seen.  The remainder of
joint spaces are normal.  Tarsometatarsal alignment is normal.
IMPRESSION: Soft tissue swelling and degenerative change of the right first MTP
joint.  Possible gout.  No definite erosion.

## 2011-01-27 LAB — BLOOD GAS, ARTERIAL
Acid-Base Excess: 2.6 — ABNORMAL HIGH
Bicarbonate: 26.5 — ABNORMAL HIGH
O2 Content: 2
O2 Saturation: 97.6
Patient temperature: 37
TCO2: 23.2
pCO2 arterial: 39.7
pH, Arterial: 7.439 — ABNORMAL HIGH
pO2, Arterial: 102 — ABNORMAL HIGH

## 2011-01-27 LAB — BASIC METABOLIC PANEL
BUN: 11
CO2: 27
Calcium: 9.2
Chloride: 103
Creatinine, Ser: 1.59 — ABNORMAL HIGH
GFR calc Af Amer: 40 — ABNORMAL LOW
GFR calc non Af Amer: 33 — ABNORMAL LOW
Glucose, Bld: 123 — ABNORMAL HIGH
Potassium: 3 — ABNORMAL LOW
Sodium: 138

## 2011-01-27 LAB — DIFFERENTIAL
Basophils Absolute: 0
Basophils Relative: 0
Eosinophils Absolute: 0.4
Eosinophils Relative: 4
Lymphocytes Relative: 10 — ABNORMAL LOW
Lymphs Abs: 1
Monocytes Absolute: 0.5
Monocytes Relative: 6
Neutro Abs: 7.8 — ABNORMAL HIGH
Neutrophils Relative %: 80 — ABNORMAL HIGH

## 2011-01-27 LAB — CBC
HCT: 40.7
Hemoglobin: 13.4
MCHC: 32.8
MCV: 82
Platelets: 188
RBC: 4.96
RDW: 16.2 — ABNORMAL HIGH
WBC: 9.8

## 2011-01-27 LAB — B-NATRIURETIC PEPTIDE (CONVERTED LAB): Pro B Natriuretic peptide (BNP): <30

## 2011-05-30 ENCOUNTER — Emergency Department (HOSPITAL_COMMUNITY): Payer: Medicare Other

## 2011-05-30 ENCOUNTER — Emergency Department (HOSPITAL_COMMUNITY)
Admission: EM | Admit: 2011-05-30 | Discharge: 2011-05-30 | Disposition: A | Discharge status: Home / Self Care | Payer: Medicare Other | Attending: Emergency Medicine | Admitting: Emergency Medicine

## 2011-05-30 ENCOUNTER — Encounter (HOSPITAL_COMMUNITY): Payer: Self-pay | Admitting: *Deleted

## 2011-05-30 DIAGNOSIS — J3489 Other specified disorders of nose and nasal sinuses: Secondary | ICD-10-CM | POA: Insufficient documentation

## 2011-05-30 DIAGNOSIS — J029 Acute pharyngitis, unspecified: Secondary | ICD-10-CM | POA: Insufficient documentation

## 2011-05-30 DIAGNOSIS — J40 Bronchitis, not specified as acute or chronic: Secondary | ICD-10-CM | POA: Insufficient documentation

## 2011-05-30 DIAGNOSIS — R509 Fever, unspecified: Secondary | ICD-10-CM | POA: Insufficient documentation

## 2011-05-30 DIAGNOSIS — I10 Essential (primary) hypertension: Secondary | ICD-10-CM | POA: Insufficient documentation

## 2011-05-30 HISTORY — DX: Essential (primary) hypertension: I10

## 2011-05-30 IMAGING — CR DG CHEST 2V
2 series · 2 of 2 positions shown · non-contrast
Comparison: [DATE]

CLINICAL DATA: Cough, sore throat, hypertension

CHEST - 2 VIEW

[view not recorded (1 of 2)]
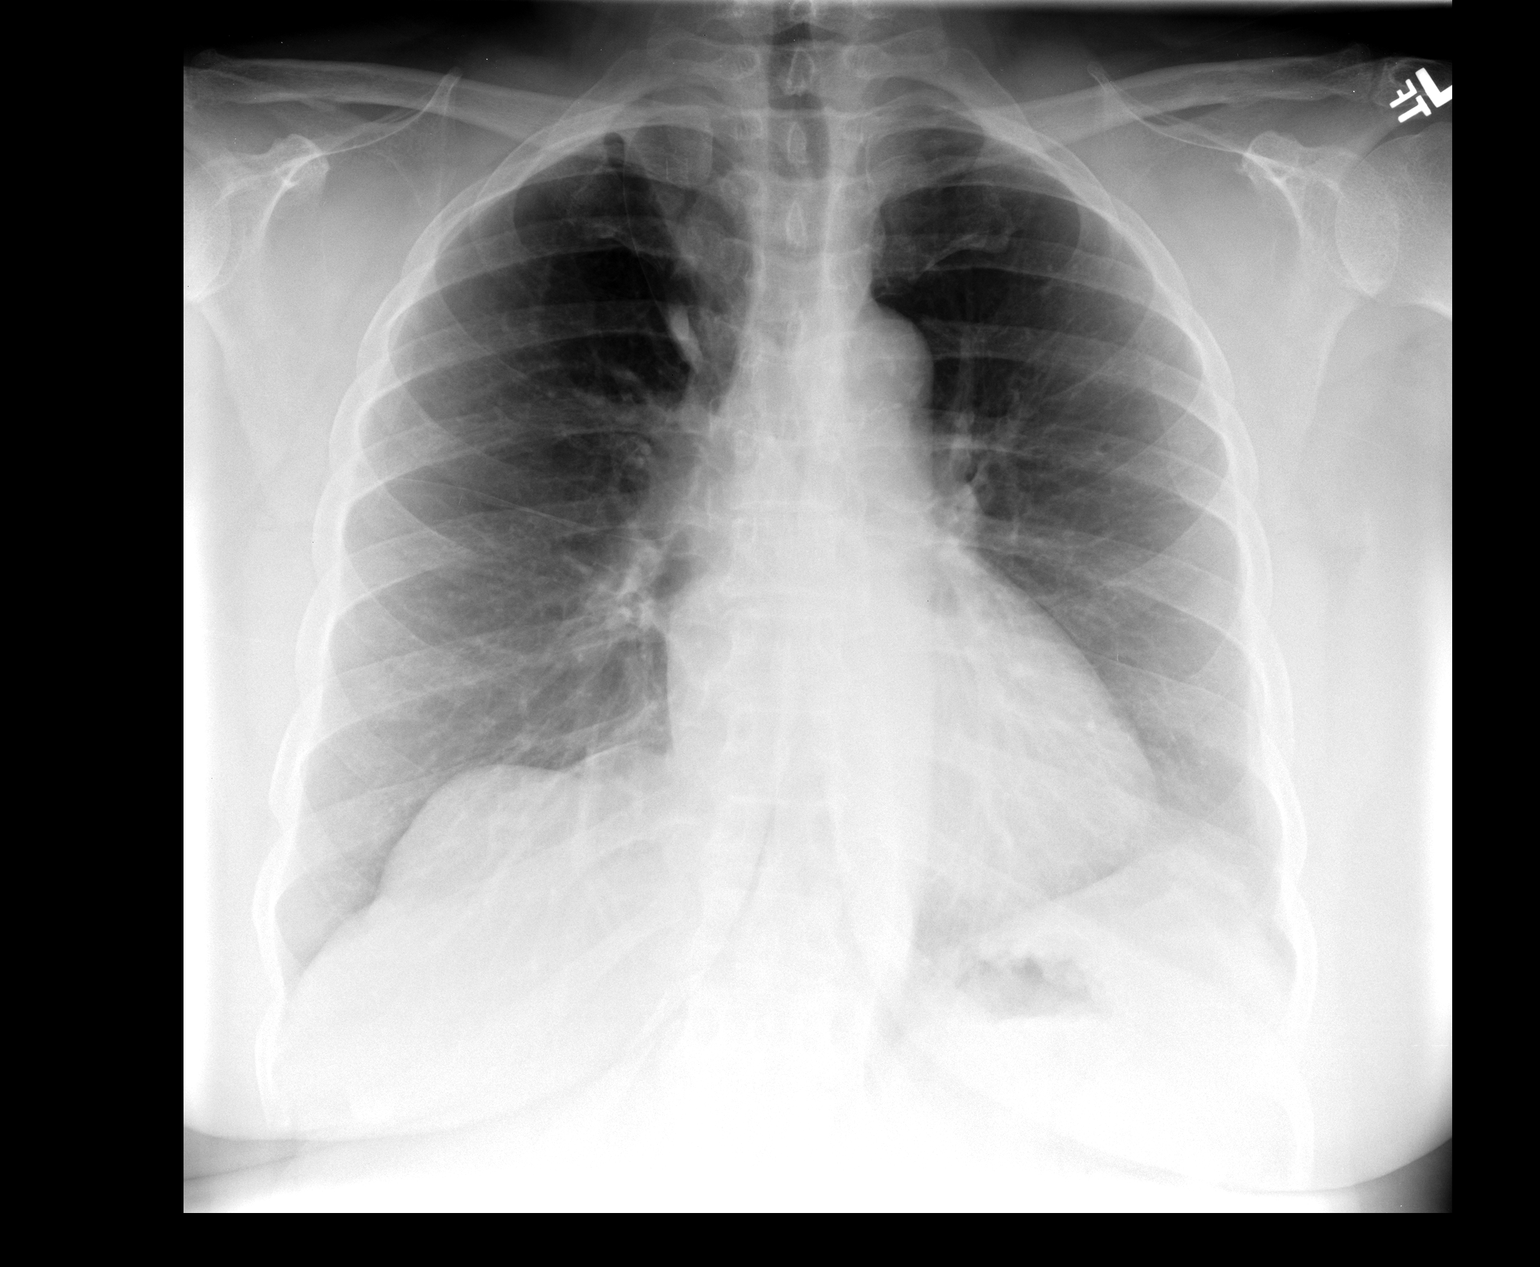

[view not recorded (2 of 2)]
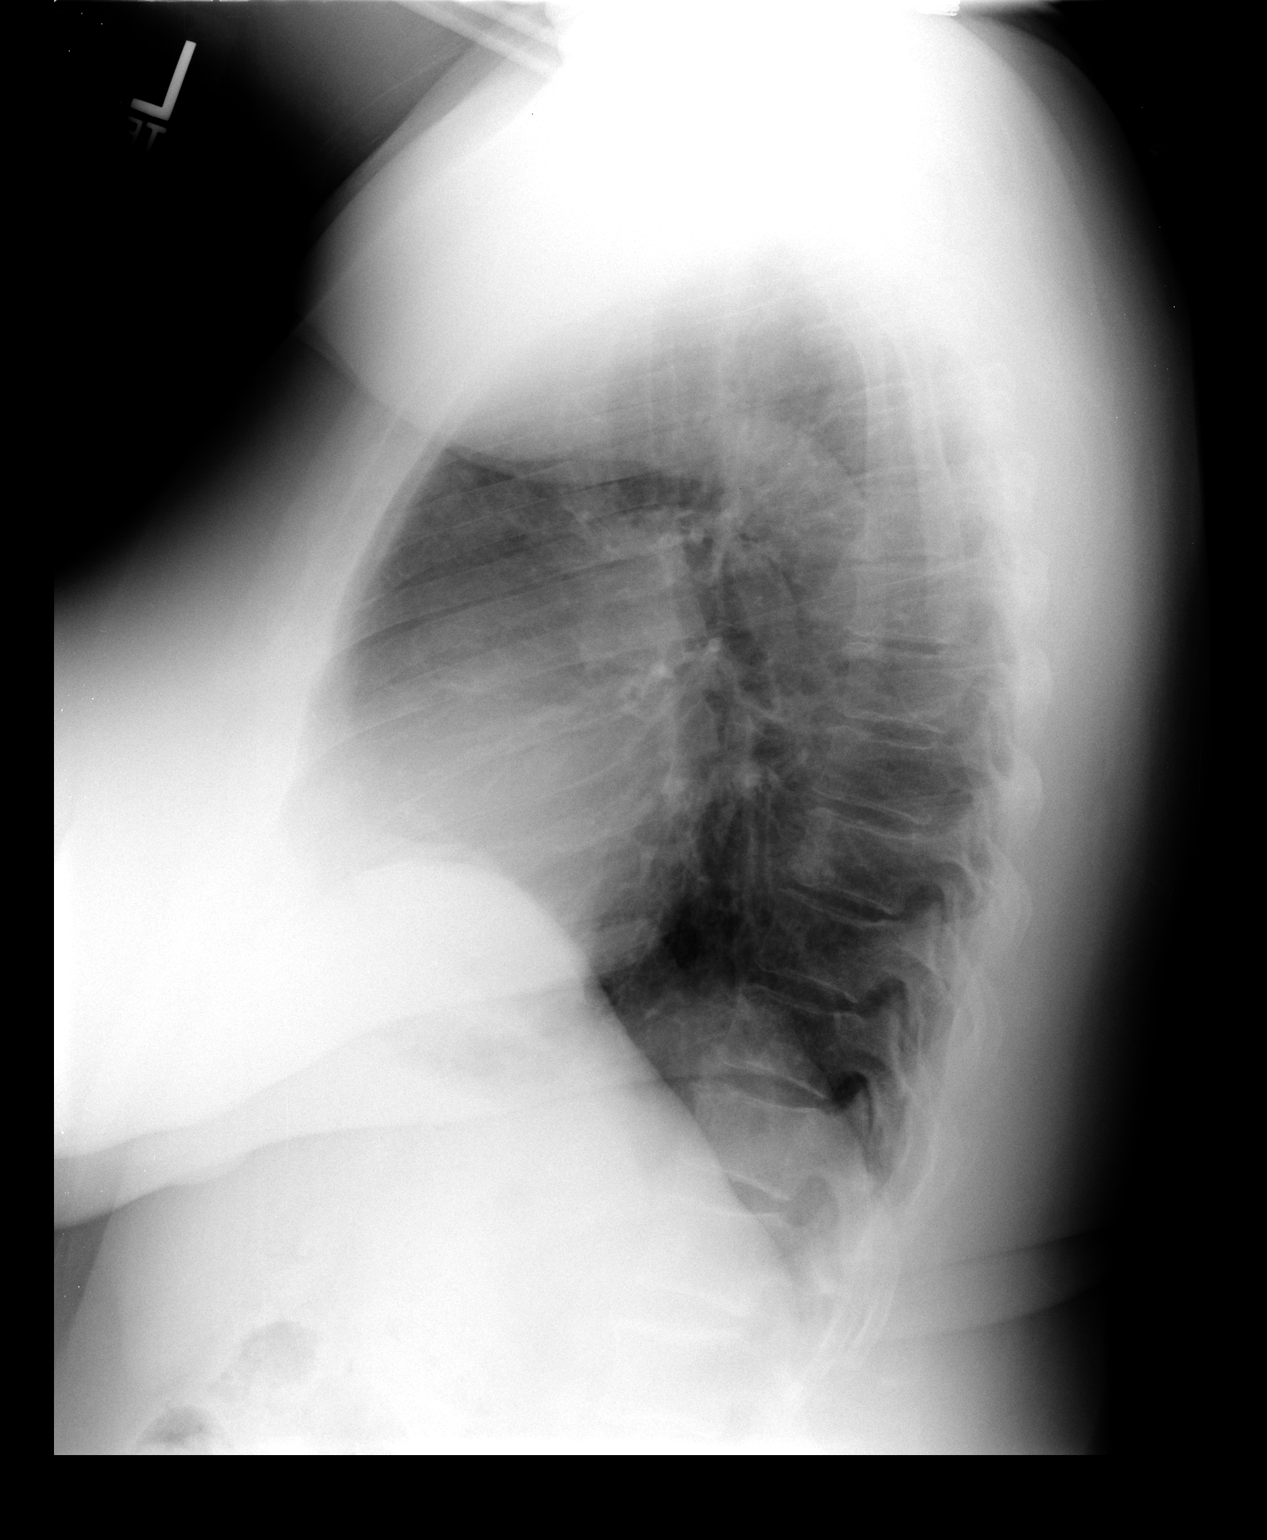

[2 of 2 positions shown; findings below may reference images not displayed]

FINDINGS: Azygos fissure noted. Lungs clear.  Heart size and
pulmonary vascularity normal.  No effusion.  Visualized bones
unremarkable.
IMPRESSION: No acute disease

## 2011-05-30 MED ORDER — OXYCODONE-ACETAMINOPHEN 5-325 MG PO TABS: 1.0000 | ORAL_TABLET | Freq: Four times a day (QID) | ORAL | Status: AC | PRN

## 2011-05-30 MED ORDER — AZITHROMYCIN 250 MG PO TABS: ORAL_TABLET | ORAL | Status: DC

## 2011-05-30 MED ORDER — HYDROCODONE-ACETAMINOPHEN 5-325 MG PO TABS
1.0000 | ORAL_TABLET | Freq: Once | ORAL | Status: AC
  Administered 2011-05-30: 1 via ORAL
  Filled 2011-05-30: qty 1

## 2011-05-30 NOTE — ED Notes (Signed)
Cough, sore throat, fever.

## 2011-05-30 NOTE — ED Provider Notes (Signed)
History    This chart was scribed for Allison Diego, MD, MD by Rhae Lerner. The patient was seen in room APA17 and the patient's care was started at 6:32PM.   CSN: [id number]  Arrival date & time 05/30/11  1750   First MD Initiated Contact with Patient 05/30/11 1819      Chief Complaint  Patient presents with  . Cough    (Consider location/radiation/quality/duration/timing/severity/associated sxs/prior treatment) Patient is a 71 y.o. female presenting with cough. The history is provided by the patient.  Cough This is a new problem. The problem occurs constantly. The problem has not changed since onset.The cough is productive of sputum. She has tried nothing for the symptoms.   MAKYNLI VASICEK is a 71 y.o. female who presents to the Emergency Department complaining of moderate sore throat and productive cough onset. She also has fever and congestion. Denies vomiting. Pt reports the symptoms started with sneezing. She states throat pain is aggravated by drinking and eating. Pt has a history of HTN.   Past Medical History  Diagnosis Date  . Hypertension     Past Surgical History  Procedure Date  . Tubal ligation   . Abdominal hysterectomy     History reviewed. No pertinent family history.  History  Substance Use Topics  . Smoking status: Former Research scientist (life sciences)  . Smokeless tobacco: Not on file  . Alcohol Use: No    OB History    Grav Para Term Preterm Abortions TAB SAB Ect Mult Living                  Review of Systems  Respiratory: Positive for cough.   All other systems reviewed and are negative.   10 Systems reviewed and are negative for acute change except as noted in the HPI.  Allergies  Codeine and Penicillins  Home Medications  No current outpatient prescriptions on file.  BP 186/90  Pulse 94  Temp(Src) 98.6 F (37 C) (Oral)  Resp 20  Ht 5' (1.524 m)  Wt 187 lb (84.823 kg)  BMI 36.52 kg/m2  SpO2 97%  Physical Exam  Nursing note and vitals  reviewed. Constitutional: She is oriented to person, place, and time. She appears well-developed and well-nourished. No distress.  HENT:  Head: Normocephalic and atraumatic.  Eyes: Conjunctivae and EOM are normal. No scleral icterus.  Neck: Neck supple. No thyromegaly present.  Cardiovascular: Normal rate and regular rhythm.  Exam reveals no gallop and no friction rub.   No murmur heard. Pulmonary/Chest: No stridor. She has no wheezes. She has no rales. She exhibits no tenderness.  Abdominal: She exhibits no distension. There is no tenderness. There is no rebound.  Musculoskeletal: Normal range of motion. She exhibits no edema.  Lymphadenopathy:    She has no cervical adenopathy.  Neurological: She is oriented to person, place, and time. Coordination normal.  Skin: No rash noted. No erythema.  Psychiatric: She has a normal mood and affect. Her behavior is normal.    ED Course  Procedures (including critical care time)  DIAGNOSTIC STUDIES: Oxygen Saturation is 97% on room air, normal by my interpretation.    COORDINATION OF CARE:    Labs Reviewed - No data to display Dg Chest 2 View  05/30/2011  *RADIOLOGY REPORT*  Clinical Data: Cough, sore throat, hypertension  CHEST - 2 VIEW  Comparison:  04/09/2007  Findings:  Azygos fissure noted. Lungs clear.  Heart size and pulmonary vascularity normal.  No effusion.  Visualized bones unremarkable.  IMPRESSION: No acute disease  Original Report Authenticated By: Trecia Rogers, M.D.     No diagnosis found.    MDM   Bronchitis and pharyngitis   The chart was scribed for me under my direct supervision.  I personally performed the history, physical, and medical decision making and all procedures in the evaluation of this patient.Allison Diego, MD 05/30/11 2049

## 2012-02-05 ENCOUNTER — Encounter (HOSPITAL_COMMUNITY): Payer: Self-pay | Admitting: *Deleted

## 2012-02-05 ENCOUNTER — Emergency Department (HOSPITAL_COMMUNITY)
Admission: EM | Admit: 2012-02-05 | Discharge: 2012-02-05 | Disposition: A | Discharge status: Home / Self Care | Payer: Medicare Other | Attending: Emergency Medicine | Admitting: Emergency Medicine

## 2012-02-05 DIAGNOSIS — Z87891 Personal history of nicotine dependence: Secondary | ICD-10-CM | POA: Insufficient documentation

## 2012-02-05 DIAGNOSIS — Z888 Allergy status to other drugs, medicaments and biological substances status: Secondary | ICD-10-CM | POA: Insufficient documentation

## 2012-02-05 DIAGNOSIS — I10 Essential (primary) hypertension: Secondary | ICD-10-CM | POA: Insufficient documentation

## 2012-02-05 DIAGNOSIS — N649 Disorder of breast, unspecified: Secondary | ICD-10-CM | POA: Insufficient documentation

## 2012-02-05 MED ORDER — HYDROCODONE-ACETAMINOPHEN 5-325 MG PO TABS: ORAL_TABLET | ORAL | Status: DC

## 2012-02-05 MED ORDER — HYDROCODONE-ACETAMINOPHEN 5-325 MG PO TABS
1.0000 | ORAL_TABLET | Freq: Once | ORAL | Status: AC
  Administered 2012-02-05: 1 via ORAL
  Filled 2012-02-05: qty 1

## 2012-02-05 MED ORDER — DOXYCYCLINE HYCLATE 100 MG PO TABS: 100.0000 mg | ORAL_TABLET | Freq: Two times a day (BID) | ORAL | Status: DC

## 2012-02-05 NOTE — ED Notes (Signed)
Pt discharged. Pt stable at time of discharge. Medications reviewed pt has no questions regarding discharge at this time. Pt voiced understanding of discharge instructions.  

## 2012-02-05 NOTE — ED Notes (Signed)
Pt c/o "boils" under right underarm area, c/o pain, burning, itching for the past two days,

## 2012-02-05 NOTE — ED Provider Notes (Signed)
History     CSN: [id number]  Arrival date & time 02/05/12  1711   First MD Initiated Contact with Patient 02/05/12 1842      Chief Complaint  Patient presents with  . Abscess     HPI Pt was seen at 1910.  Per pt, c/o gradual onset and persistence of constant "skin lesions" on her right lateral and inferior breast areas for the past several weeks to months.  Pt describes them as "sore" and "itching sometimes."    Denies CP/palpitations, no SOB/cough, no fevers, no breast/nipple drainage, no injury.    Past Medical History  Diagnosis Date  . Hypertension     Past Surgical History  Procedure Date  . Tubal ligation   . Abdominal hysterectomy      History  Substance Use Topics  . Smoking status: Former Research scientist (life sciences)  . Smokeless tobacco: Not on file  . Alcohol Use: No    Review of Systems ROS: Statement: All systems negative except as marked or noted in the HPI; Constitutional: Negative for fever and chills. ; ; Eyes: Negative for eye pain, redness and discharge. ; ; ENMT: Negative for ear pain, hoarseness, nasal congestion, sinus pressure and sore throat. ; ; Cardiovascular: Negative for chest pain, palpitations, diaphoresis, dyspnea and peripheral edema. ; ; Respiratory: Negative for cough, wheezing and stridor. ; ; Gastrointestinal: Negative for nausea, vomiting, diarrhea, abdominal pain, blood in stool, hematemesis, jaundice and rectal bleeding. . ; ; Genitourinary: Negative for dysuria, flank pain and hematuria. ; ; Musculoskeletal: Negative for back pain and neck pain. Negative for swelling and trauma.; ; Skin: Negative for pruritus, rash, abrasions, blisters, bruising and +skin lesion.; ; Neuro: Negative for headache, lightheadedness and neck stiffness. Negative for weakness, altered level of consciousness , altered mental status, extremity weakness, paresthesias, involuntary movement, seizure and syncope.       Allergies  Codeine and Penicillins  Home Medications    Current Outpatient Rx  Name Route Sig Dispense Refill  . CALCIUM + D PO Oral Take 1 tablet by mouth daily.    Marland Kitchen LISINOPRIL-HYDROCHLOROTHIAZIDE 20-12.5 MG PO TABS Oral Take 1 tablet by mouth daily.    Marland Kitchen LORAZEPAM 2 MG PO TABS Oral Take 2 mg by mouth at bedtime.    Marland Kitchen NAPROXEN 500 MG PO TABS Oral Take 1,000 mg by mouth 2 (two) times daily as needed. For pain    . VERAPAMIL HCL 120 MG PO TABS Oral Take 120 mg by mouth 2 (two) times daily.    Marland Kitchen DOXYCYCLINE HYCLATE 100 MG PO TABS Oral Take 1 tablet (100 mg total) by mouth 2 (two) times daily. 14 tablet 0  . HYDROCODONE-ACETAMINOPHEN 5-325 MG PO TABS  1 tabs PO q6 hours prn pain 12 tablet 0    BP 152/87  Pulse 72  Temp 98.3 F (36.8 C) (Oral)  Resp 20  Ht 5' (1.524 m)  Wt 165 lb (74.844 kg)  BMI 32.22 kg/m2  SpO2 100%  Physical Exam 1915: Physical examination:  Nursing notes reviewed; Vital signs and O2 SAT reviewed;  Constitutional: Well developed, Well nourished, Well hydrated, In no acute distress; Head:  Normocephalic, atraumatic; Eyes: EOMI, PERRL, No scleral icterus; ENMT: Mouth and pharynx normal, Mucous membranes moist; Neck: Supple, Full range of motion, No lymphadenopathy; Cardiovascular: Regular rate and rhythm, No murmur, rub, or gallop; Respiratory: Breath sounds clear & equal bilaterally, No rales, rhonchi, wheezes.  Speaking full sentences with ease, Normal respiratory effort/excursion; Chest: Nontender, Movement normal; Abdomen: Soft, Nontender,  Nondistended, Normal bowel sounds;; Extremities: Pulses normal, No tenderness, No edema, No calf edema or asymmetry.; Neuro: AA&Ox3, Major CN grossly intact.  Speech clear. No gross focal motor or sensory deficits in extremities.; Skin: Color normal, Warm, Dry; +multiple large clustered growths in multiple areas on right lateral and inferior breast.    ED Course  Procedures    MDM  MDM Reviewed: nursing note and vitals     1955:  Lesions on right breast do not clearly appear  to be abscesses and are not vesicular, not draining, not fluctuant, not nodular, not necrotic.  There is a small area of mild erythema to inferior right breast area, does not appear fungal rash.  Not tender, no soft tissue crepitus. Will tx with abx and refer to General Surgeon for possible biopsy/removal.  Dx d/w pt. Questions answered.  Verb understanding, agreeable to d/c home with outpt f/u.        Alfonzo Feller, DO 02/08/12 1149

## 2012-10-22 ENCOUNTER — Other Ambulatory Visit (HOSPITAL_COMMUNITY): Payer: Self-pay | Admitting: Family Medicine

## 2012-10-22 DIAGNOSIS — N644 Mastodynia: Secondary | ICD-10-CM

## 2012-10-22 DIAGNOSIS — Z139 Encounter for screening, unspecified: Secondary | ICD-10-CM

## 2012-10-24 ENCOUNTER — Encounter (INDEPENDENT_AMBULATORY_CARE_PROVIDER_SITE_OTHER): Payer: Self-pay | Admitting: *Deleted

## 2012-11-19 ENCOUNTER — Ambulatory Visit (HOSPITAL_COMMUNITY)
Admission: RE | Admit: 2012-11-19 | Discharge: 2012-11-19 | Disposition: A | Discharge status: Home / Self Care | Payer: Medicare Other | Source: Ambulatory Visit | Attending: Family Medicine | Admitting: Family Medicine

## 2012-11-19 DIAGNOSIS — Z139 Encounter for screening, unspecified: Secondary | ICD-10-CM | POA: Insufficient documentation

## 2012-11-27 ENCOUNTER — Ambulatory Visit (HOSPITAL_COMMUNITY)
Admission: RE | Admit: 2012-11-27 | Discharge: 2012-11-27 | Disposition: A | Discharge status: Home / Self Care | Payer: Medicare Other | Source: Ambulatory Visit | Attending: Family Medicine | Admitting: Family Medicine

## 2012-11-27 DIAGNOSIS — N644 Mastodynia: Secondary | ICD-10-CM | POA: Insufficient documentation

## 2013-05-13 ENCOUNTER — Emergency Department (HOSPITAL_COMMUNITY)
Admission: EM | Admit: 2013-05-13 | Discharge: 2013-05-13 | Disposition: A | Discharge status: Home / Self Care | Payer: Medicare HMO | Attending: Emergency Medicine | Admitting: Emergency Medicine

## 2013-05-13 ENCOUNTER — Emergency Department (HOSPITAL_COMMUNITY): Payer: Medicare HMO

## 2013-05-13 ENCOUNTER — Encounter (HOSPITAL_COMMUNITY): Payer: Self-pay | Admitting: Emergency Medicine

## 2013-05-13 DIAGNOSIS — J111 Influenza due to unidentified influenza virus with other respiratory manifestations: Secondary | ICD-10-CM

## 2013-05-13 DIAGNOSIS — I10 Essential (primary) hypertension: Secondary | ICD-10-CM | POA: Insufficient documentation

## 2013-05-13 DIAGNOSIS — Z880 Allergy status to penicillin: Secondary | ICD-10-CM | POA: Insufficient documentation

## 2013-05-13 DIAGNOSIS — Z79899 Other long term (current) drug therapy: Secondary | ICD-10-CM | POA: Insufficient documentation

## 2013-05-13 DIAGNOSIS — Z87891 Personal history of nicotine dependence: Secondary | ICD-10-CM | POA: Insufficient documentation

## 2013-05-13 LAB — COMPREHENSIVE METABOLIC PANEL
ALK PHOS: 102 U/L (ref 39–117)
ALT: 21 U/L (ref 0–35)
AST: 24 U/L (ref 0–37)
Albumin: 3.6 g/dL (ref 3.5–5.2)
BUN: 14 mg/dL (ref 6–23)
CHLORIDE: 104 meq/L (ref 96–112)
CO2: 28 meq/L (ref 19–32)
Calcium: 9.8 mg/dL (ref 8.4–10.5)
Creatinine, Ser: 1.35 mg/dL — ABNORMAL HIGH (ref 0.50–1.10)
GFR calc non Af Amer: 39 mL/min — ABNORMAL LOW (ref >90)
GFR, EST AFRICAN AMERICAN: 45 mL/min — AB (ref >90)
GLUCOSE: 95 mg/dL (ref 70–99)
POTASSIUM: 4.2 meq/L (ref 3.7–5.3)
SODIUM: 143 meq/L (ref 137–147)
TOTAL PROTEIN: 7.8 g/dL (ref 6.0–8.3)
Total Bilirubin: <0.2 mg/dL — ABNORMAL LOW (ref 0.3–1.2)

## 2013-05-13 LAB — CBC WITH DIFFERENTIAL/PLATELET
BASOS ABS: 0 10*3/uL (ref 0.0–0.1)
BASOS PCT: 0 % (ref 0–1)
EOS PCT: 4 % (ref 0–5)
Eosinophils Absolute: 0.2 10*3/uL (ref 0.0–0.7)
HEMATOCRIT: 43.1 % (ref 36.0–46.0)
HEMOGLOBIN: 14.2 g/dL (ref 12.0–15.0)
LYMPHS PCT: 28 % (ref 12–46)
Lymphs Abs: 1.4 10*3/uL (ref 0.7–4.0)
MCH: 26.9 pg (ref 26.0–34.0)
MCHC: 32.9 g/dL (ref 30.0–36.0)
MCV: 81.8 fL (ref 78.0–100.0)
MONOS PCT: 9 % (ref 3–12)
Monocytes Absolute: 0.5 10*3/uL (ref 0.1–1.0)
NEUTROS ABS: 2.9 10*3/uL (ref 1.7–7.7)
Neutrophils Relative %: 59 % (ref 43–77)
Platelets: 177 10*3/uL (ref 150–400)
RBC: 5.27 MIL/uL — ABNORMAL HIGH (ref 3.87–5.11)
RDW: 16.3 % — ABNORMAL HIGH (ref 11.5–15.5)
WBC Morphology: INCREASED
WBC: 5 10*3/uL (ref 4.0–10.5)

## 2013-05-13 IMAGING — CR DG CHEST 2V
2 series · 2 of 2 positions shown · non-contrast
Comparison: Prior radiograph from [DATE]

CLINICAL DATA: Cough

EXAM:
CHEST  2 VIEW

[view not recorded (1 of 2)]
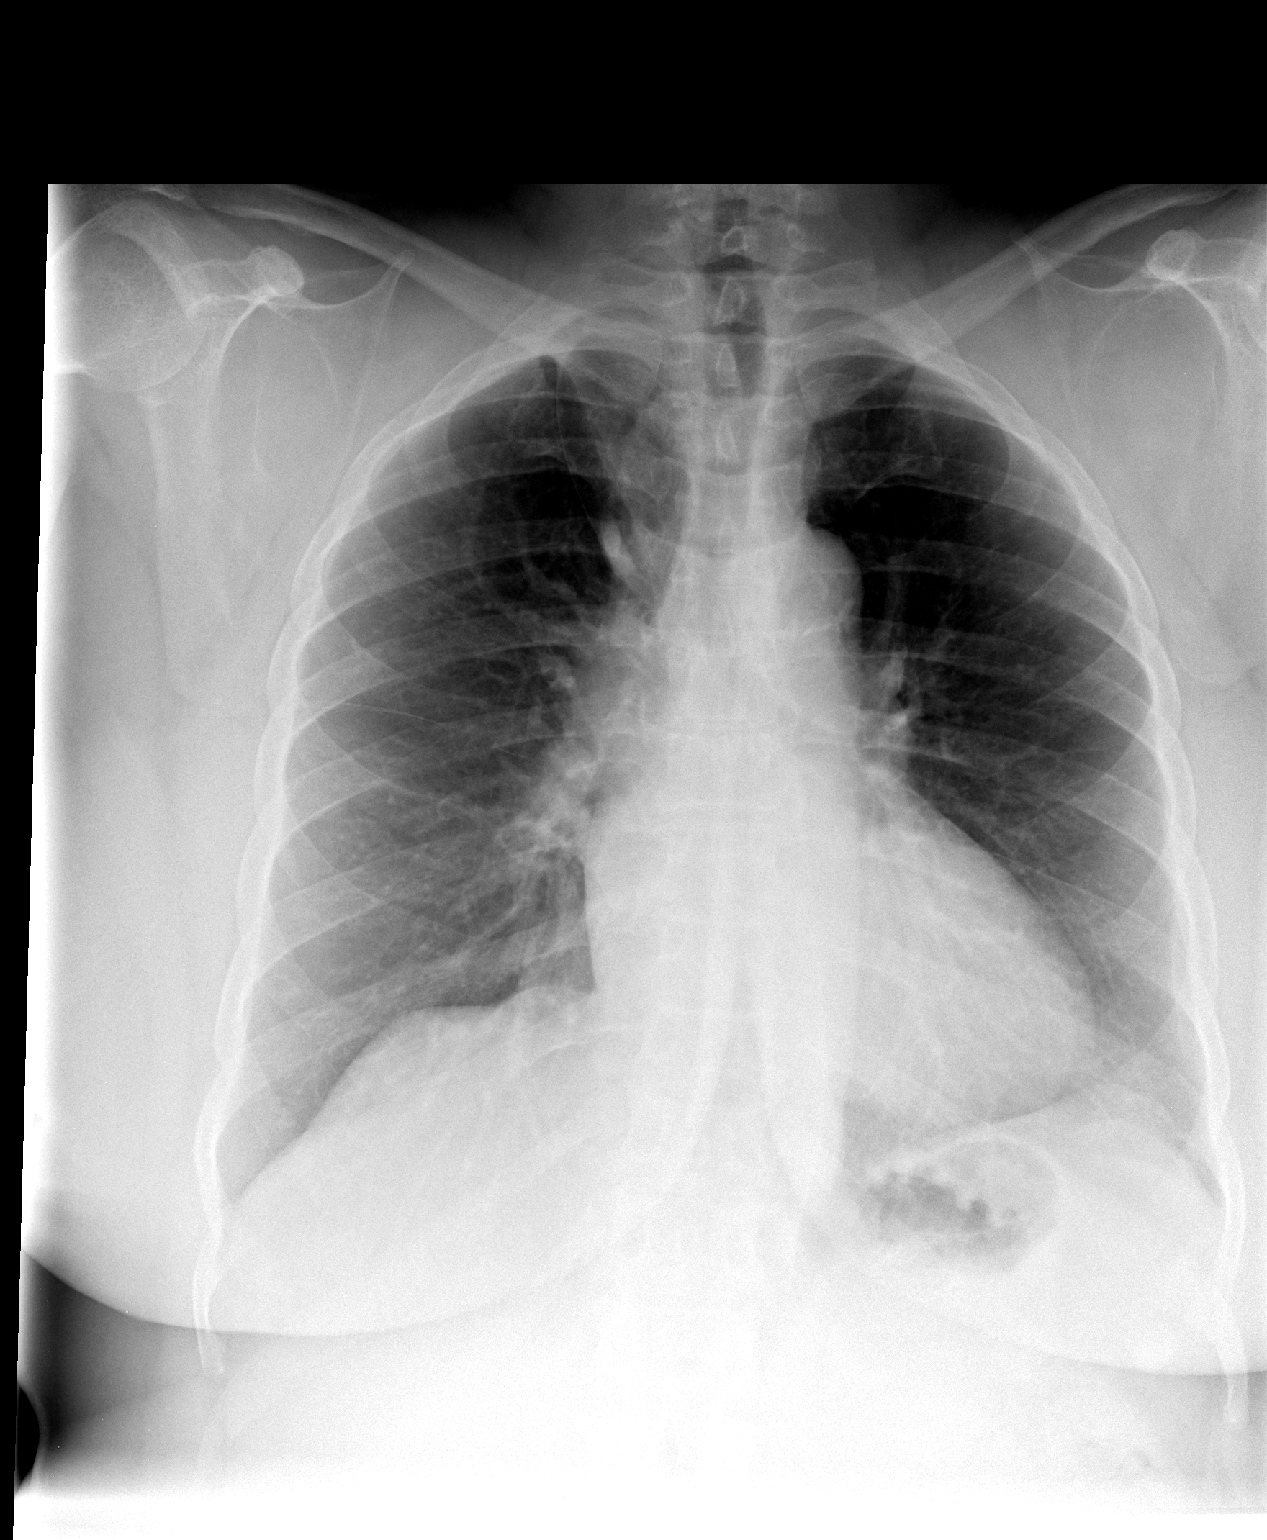

[view not recorded (2 of 2)]
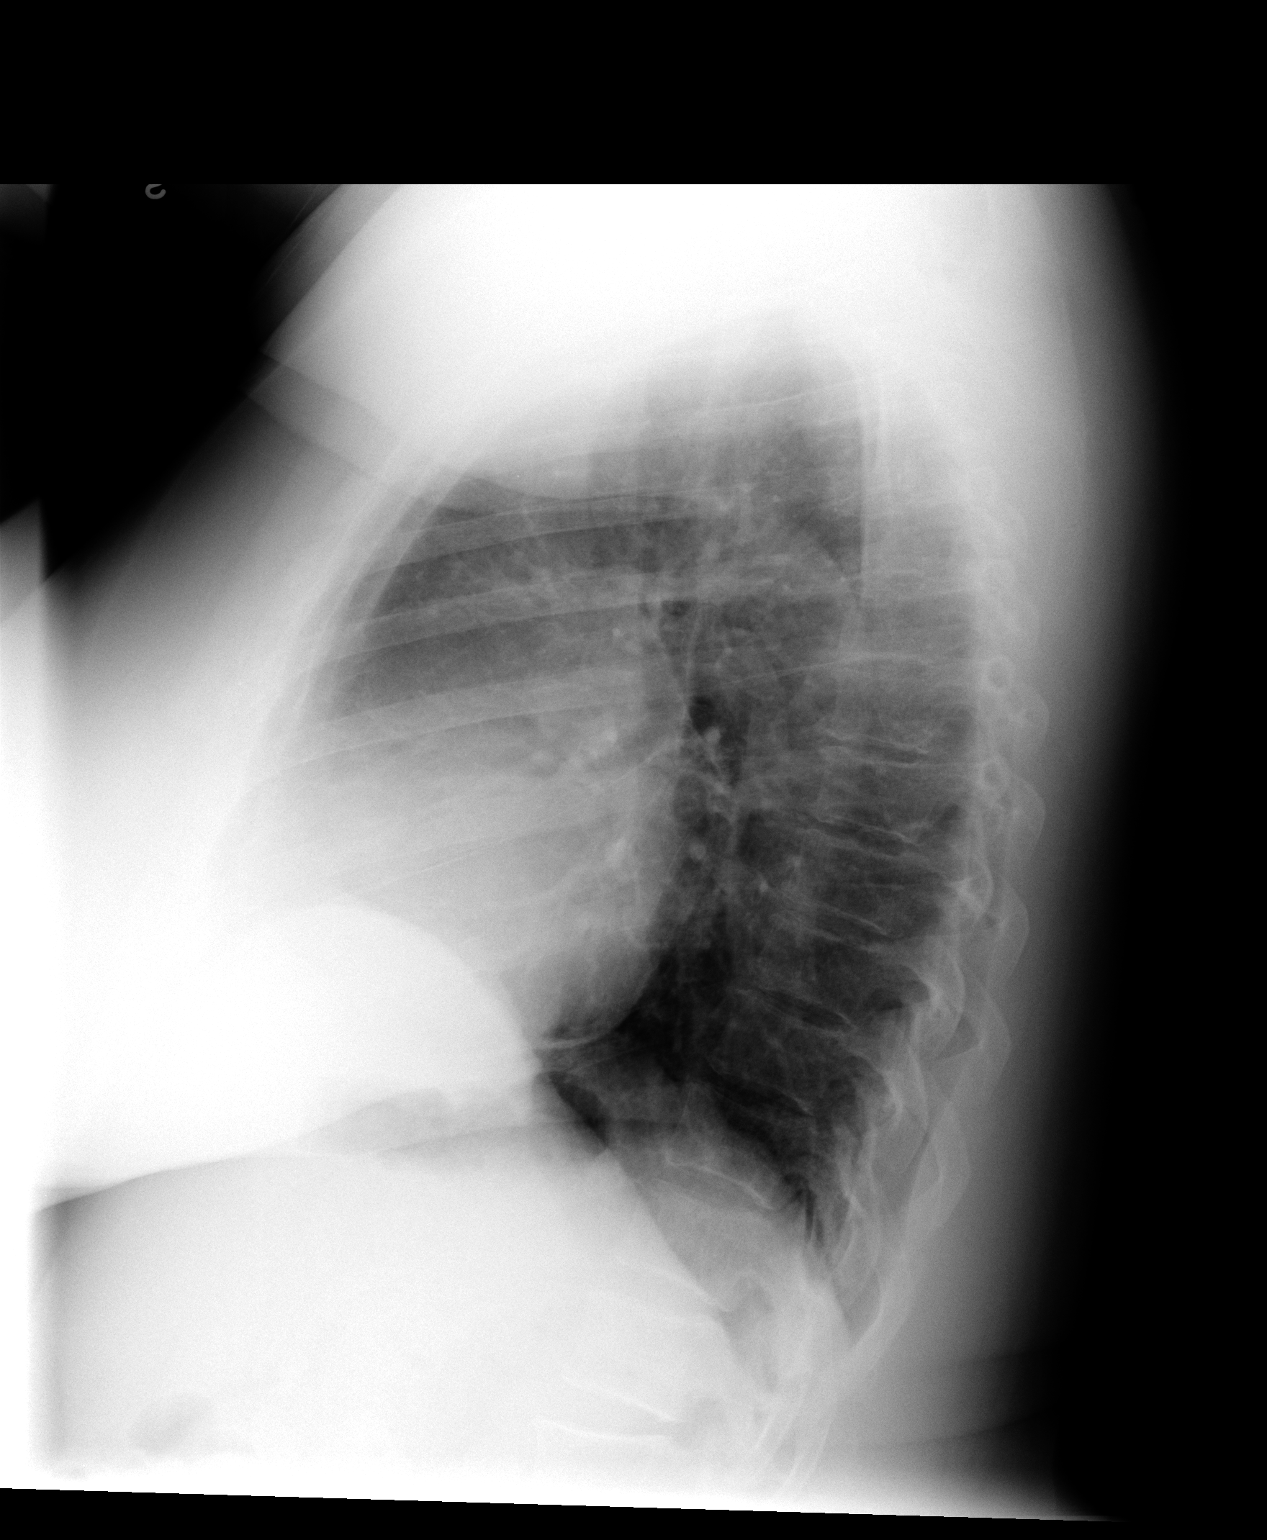

[2 of 2 positions shown; findings below may reference images not displayed]

FINDINGS: The cardiac and mediastinal silhouettes are stable in size and
contour, and remain within normal limits.

The lungs are normally inflated. No airspace consolidation, pleural
effusion, or pulmonary edema is identified. There is no
pneumothorax.

No acute osseous abnormality identified.
IMPRESSION: No active cardiopulmonary disease.

## 2013-05-13 MED ORDER — OSELTAMIVIR PHOSPHATE 75 MG PO CAPS
75.0000 mg | ORAL_CAPSULE | Freq: Once | ORAL | Status: AC
  Administered 2013-05-13: 75 mg via ORAL
  Filled 2013-05-13: qty 1

## 2013-05-13 MED ORDER — ALBUTEROL SULFATE HFA 108 (90 BASE) MCG/ACT IN AERS
2.0000 | INHALATION_SPRAY | RESPIRATORY_TRACT | Status: DC | PRN
  Administered 2013-05-13: 2 via RESPIRATORY_TRACT
  Filled 2013-05-13: qty 6.7

## 2013-05-13 MED ORDER — OSELTAMIVIR PHOSPHATE 75 MG PO CAPS: 75.0000 mg | ORAL_CAPSULE | Freq: Two times a day (BID) | ORAL | Status: DC

## 2013-05-13 MED ORDER — ALBUTEROL SULFATE (2.5 MG/3ML) 0.083% IN NEBU
5.0000 mg | INHALATION_SOLUTION | Freq: Once | RESPIRATORY_TRACT | Status: AC
  Administered 2013-05-13: 5 mg via RESPIRATORY_TRACT
  Filled 2013-05-13: qty 6

## 2013-05-13 NOTE — ED Notes (Signed)
Cough, fever, chills, body aches onset yesterday

## 2013-05-13 NOTE — ED Provider Notes (Signed)
CSN: [id number]     Arrival date & time 05/13/13  1452 History  This chart was scribed for [name], MD by Eugenia Mcalpine, ED Scribe. This patient was seen in room APA19/APA19 and the patient's care was started at 5:16 PM.    Chief Complaint  Patient presents with  . Influenza   (Consider location/radiation/quality/duration/timing/severity/associated sxs/prior Treatment) Patient is a 73 y.o. female presenting with flu symptoms. The history is provided by the patient. No language interpreter was used.  Influenza Presenting symptoms: cough and myalgias   Presenting symptoms: no diarrhea, no fatigue, no headaches and no vomiting   Cough:    Cough characteristics:  Productive   Sputum characteristics:  Bloody   Duration:  1 day   Timing:  Intermittent Severity:  Mild Onset quality:  Gradual Duration:  1 day Progression:  Worsening Chronicity:  New Relieved by:  None tried Worsened by:  Nothing tried Ineffective treatments:  None tried Associated symptoms: chills, decreased appetite, decreased physical activity and nasal congestion   Risk factors: sick contacts    Pt did not receive flu shot. With chuills Past Medical History  Diagnosis Date  . Hypertension    Past Surgical History  Procedure Laterality Date  . Tubal ligation    . Abdominal hysterectomy     No family history on file. History  Substance Use Topics  . Smoking status: Former Research scientist (life sciences)  . Smokeless tobacco: Not on file  . Alcohol Use: No   OB History   Grav Para Term Preterm Abortions TAB SAB Ect Mult Living                 Review of Systems  Constitutional: Positive for chills and decreased appetite. Negative for appetite change and fatigue.  HENT: Positive for congestion. Negative for ear discharge and sinus pressure.   Eyes: Negative for discharge.  Respiratory: Positive for cough.   Cardiovascular: Negative for chest pain.  Gastrointestinal: Negative for vomiting, abdominal pain and diarrhea.   Genitourinary: Negative for frequency and hematuria.  Musculoskeletal: Positive for myalgias. Negative for back pain.  Skin: Negative for rash.  Neurological: Negative for seizures and headaches.  Psychiatric/Behavioral: Negative for hallucinations.    Allergies  Codeine and Penicillins  Home Medications   Current Outpatient Rx  Name  Route  Sig  Dispense  Refill  . Calcium Carbonate-Vitamin D (CALCIUM + D PO)   Oral   Take 1 tablet by mouth daily.         Marland Kitchen doxycycline (VIBRA-TABS) 100 MG tablet   Oral   Take 1 tablet (100 mg total) by mouth 2 (two) times daily.   14 tablet   0   . HYDROcodone-acetaminophen (NORCO/VICODIN) 5-325 MG per tablet      1 tabs PO q6 hours prn pain   12 tablet   0   . lisinopril-hydrochlorothiazide (PRINZIDE,ZESTORETIC) 20-12.5 MG per tablet   Oral   Take 1 tablet by mouth daily.         Marland Kitchen LORazepam (ATIVAN) 2 MG tablet   Oral   Take 2 mg by mouth at bedtime.         . naproxen (NAPROSYN) 500 MG tablet   Oral   Take 1,000 mg by mouth 2 (two) times daily as needed. For pain         . verapamil (CALAN) 120 MG tablet   Oral   Take 120 mg by mouth 2 (two) times daily.  BP 149/65  Pulse 77  Temp(Src) 98.8 F (37.1 C) (Oral)  Resp 20  Ht 5' (1.524 m)  Wt 165 lb (74.844 kg)  BMI 32.22 kg/m2  SpO2 98% Physical Exam  Nursing note and vitals reviewed. Constitutional: She is oriented to person, place, and time. She appears well-developed.  HENT:  Head: Normocephalic.  Eyes: Conjunctivae and EOM are normal. No scleral icterus.  Neck: Neck supple. No thyromegaly present.  Cardiovascular: Normal rate and regular rhythm.  Exam reveals no gallop and no friction rub.   No murmur heard. Pulmonary/Chest: No stridor. She has wheezes (mild). She has no rales. She exhibits no tenderness.  Abdominal: She exhibits no distension. There is no tenderness. There is no rebound.  Musculoskeletal: Normal range of motion. She exhibits  no edema.  Lymphadenopathy:    She has no cervical adenopathy.  Neurological: She is oriented to person, place, and time. She exhibits normal muscle tone. Coordination normal.  Skin: No rash noted. No erythema.  Psychiatric: She has a normal mood and affect. Her behavior is normal.    ED Course  Procedures (including critical care time)  DIAGNOSTIC STUDIES: Oxygen Saturation is 98% on RA, normal by my interpretation.    COORDINATION OF CARE: 5:20 PM- Pt advised of plan for treatment and pt agrees.    Labs Review Labs Reviewed - No data to display Imaging Review No results found.  EKG Interpretation   None       MDM  Influenza    The chart was scribed for me under my direct supervision.  I personally performed the history, physical, and medical decision making and all procedures in the evaluation of this patient.[name], MD 05/13/13 [phone]

## 2013-05-13 NOTE — Discharge Instructions (Signed)
Tylenol or motrin for aches and fever.  Drink plenty of fluids.  Follow up as needed

## 2015-04-09 ENCOUNTER — Encounter (HOSPITAL_COMMUNITY): Payer: Self-pay | Admitting: Emergency Medicine

## 2015-04-09 ENCOUNTER — Emergency Department (HOSPITAL_COMMUNITY)
Admission: EM | Admit: 2015-04-09 | Discharge: 2015-04-09 | Disposition: A | Discharge status: Home / Self Care | Payer: Medicare HMO | Attending: Emergency Medicine | Admitting: Emergency Medicine

## 2015-04-09 DIAGNOSIS — S91312A Laceration without foreign body, left foot, initial encounter: Secondary | ICD-10-CM | POA: Diagnosis not present

## 2015-04-09 DIAGNOSIS — Y9289 Other specified places as the place of occurrence of the external cause: Secondary | ICD-10-CM | POA: Insufficient documentation

## 2015-04-09 DIAGNOSIS — Y998 Other external cause status: Secondary | ICD-10-CM | POA: Insufficient documentation

## 2015-04-09 DIAGNOSIS — I10 Essential (primary) hypertension: Secondary | ICD-10-CM | POA: Insufficient documentation

## 2015-04-09 DIAGNOSIS — W25XXXA Contact with sharp glass, initial encounter: Secondary | ICD-10-CM | POA: Diagnosis not present

## 2015-04-09 DIAGNOSIS — Z79899 Other long term (current) drug therapy: Secondary | ICD-10-CM | POA: Diagnosis not present

## 2015-04-09 DIAGNOSIS — Z87891 Personal history of nicotine dependence: Secondary | ICD-10-CM | POA: Insufficient documentation

## 2015-04-09 DIAGNOSIS — Y9389 Activity, other specified: Secondary | ICD-10-CM | POA: Diagnosis not present

## 2015-04-09 DIAGNOSIS — Z880 Allergy status to penicillin: Secondary | ICD-10-CM | POA: Insufficient documentation

## 2015-04-09 DIAGNOSIS — Z23 Encounter for immunization: Secondary | ICD-10-CM | POA: Insufficient documentation

## 2015-04-09 DIAGNOSIS — S99922A Unspecified injury of left foot, initial encounter: Secondary | ICD-10-CM | POA: Diagnosis present

## 2015-04-09 MED ORDER — TETANUS-DIPHTH-ACELL PERTUSSIS 5-2.5-18.5 LF-MCG/0.5 IM SUSP: 0.5000 mL | Freq: Once | INTRAMUSCULAR | Status: DC

## 2015-04-09 MED ORDER — HYDROCODONE-ACETAMINOPHEN 5-325 MG PO TABS: 1.0000 | ORAL_TABLET | ORAL | Status: DC | PRN

## 2015-04-09 MED ORDER — HYDROCODONE-ACETAMINOPHEN 5-325 MG PO TABS: 1.0000 | ORAL_TABLET | Freq: Once | ORAL | Status: DC

## 2015-04-09 MED ORDER — TETANUS-DIPHTH-ACELL PERTUSSIS 5-2.5-18.5 LF-MCG/0.5 IM SUSP
0.5000 mL | Freq: Once | INTRAMUSCULAR | Status: AC
  Administered 2015-04-09: 0.5 mL via INTRAMUSCULAR
  Filled 2015-04-09: qty 0.5

## 2015-04-09 NOTE — ED Provider Notes (Signed)
CSN: [id number]     Arrival date & time 04/09/15  [id number] History   First MD Initiated Contact with Patient 04/09/15 [phone]     Chief Complaint  Patient presents with  . Laceration     (Consider location/radiation/quality/duration/timing/severity/associated sxs/prior Treatment) Patient is a 74 y.o. female presenting with skin laceration. The history is provided by the patient. No language interpreter was used.  Laceration Location:  Foot Foot laceration location:  L foot Length (cm):  2 Depth:  Cutaneous Quality: straight   Bleeding: controlled   Laceration mechanism:  Broken glass Pain details:    Quality:  Aching   Severity:  Moderate   Progression:  Worsening Foreign body present:  No foreign bodies Relieved by:  Nothing Worsened by:  Nothing tried Ineffective treatments:  None tried Tetanus status:  Up to date Pt complains of a laceration to her foot.  Pt request pain medication  Past Medical History  Diagnosis Date  . Hypertension    Past Surgical History  Procedure Laterality Date  . Tubal ligation    . Abdominal hysterectomy     History reviewed. No pertinent family history. Social History  Substance Use Topics  . Smoking status: Former Research scientist (life sciences)  . Smokeless tobacco: None  . Alcohol Use: No   OB History    No data available     Review of Systems  Skin: Positive for wound.  All other systems reviewed and are negative.     Allergies  Codeine and Penicillins  Home Medications   Prior to Admission medications   Medication Sig Start Date End Date Taking? Authorizing Provider  acetaminophen (TYLENOL) 500 MG tablet Take 500 mg by mouth every 6 (six) hours as needed.    Historical Provider, MD  Calcium Carbonate-Vitamin D (CALCIUM + D PO) Take 1 tablet by mouth daily.    Historical Provider, MD  DM-APAP-CPM (CORICIDIN HBP FLU PO) Take 2 tablets by mouth 2 (two) times daily as needed (cold).    Historical Provider, MD  lisinopril-hydrochlorothiazide  (PRINZIDE,ZESTORETIC) 20-12.5 MG per tablet Take 1 tablet by mouth daily.    Historical Provider, MD  LORazepam (ATIVAN) 2 MG tablet Take 2 mg by mouth at bedtime.    Historical Provider, MD  naproxen (NAPROSYN) 500 MG tablet Take 1,000 mg by mouth 2 (two) times daily as needed. For pain    Historical Provider, MD  oseltamivir (TAMIFLU) 75 MG capsule Take 1 capsule (75 mg total) by mouth every 12 (twelve) hours. 05/13/13   Milton Ferguson, MD  verapamil (CALAN) 120 MG tablet Take 120 mg by mouth 2 (two) times daily.    Historical Provider, MD   BP 161/87 mmHg  Pulse 71  Temp(Src) 97.8 F (36.6 C) (Oral)  Resp 20  Ht 5' (1.524 m)  Wt 74.844 kg  BMI 32.22 kg/m2  SpO2 99% Physical Exam  Constitutional: She is oriented to person, place, and time. She appears well-developed and well-nourished.  HENT:  Head: Normocephalic and atraumatic.  Musculoskeletal: Normal range of motion.  Neurological: She is alert and oriented to person, place, and time. She has normal reflexes.  Skin:  2cm laceration left foot, superficial, lays flat no gapping nv and ns intact  Nursing note and vitals reviewed.   ED Course  Procedures (including critical care time) Labs Review Labs Reviewed - No data to display  Imaging Review No results found. I have personally reviewed and evaluated these images and lab results as part of my medical decision-making.   EKG  Interpretation None      MDM   Final diagnoses:  Laceration of foot, left, initial encounter    steristip to wound tetanus Return if any problems.    Hollace Kinnier Combs, PA-C 04/09/15 [id number]  Leonard Schwartz, MD 04/09/15 [phone]

## 2015-04-09 NOTE — ED Notes (Signed)
Pt driving and do not want pain med at this time.  Caryl Ada informed, orders d/cd.

## 2015-04-09 NOTE — Discharge Instructions (Signed)
Sterile Tape Wound Care °Some cuts and wounds can be closed using sterile tape, also called skin adhesive strips. Skin adhesive strips can be used for shallow (superficial) and simple cuts, wounds, lacerations, and surgical incisions. These strips act in place of stitches to hold the edges of the wound together, allowing for faster healing. Unlike stitches, the adhesive strips do not require needles or anesthetic medicine for placement. The strips will wear off naturally as the wound is healing. It is important to take proper care of your wound at home while it heals.  °HOME CARE INSTRUCTIONS °· Try to keep the area around your wound clean and dry. Do not allow the adhesive strips to get wet for the first 12 hours.   °· Do not use any soaps or ointments on the wound for the first 12 hours.   °· If a bandage (dressing) has been applied, follow your health care provider's instructions for how often to change the dressing. Keep the dressing dry if one has been applied.   °· Do not remove the adhesive strips. They will fall off on their own. If they do not, you may remove them gently after 10 days. You should gently wet the strips before removing them. For example, this can be done in the shower. °· Do not scratch, pick, or rub the wound area.   °· Protect the wound from further injury until it is healed.   °· Protect the wound from sun and tanning bed exposure while it is healing and for several weeks after healing.   °· Only take over-the-counter or prescription medicines as directed by your health care provider.   °· Keep all follow-up appointments as directed by your health care provider.   °SEEK MEDICAL CARE IF: °Your adhesive strips become wet or soaked with blood before the wound has healed. The tape will need to be replaced.  °SEEK IMMEDIATE MEDICAL CARE IF: °· You have increasing pain in the wound.   °· You develop a rash after the strips are applied. °· Your wound becomes red, swollen, hot, or tender.   °· You  have a red streak that goes away from the wound.   °· You have pus coming from the wound.   °· You have increased bleeding from the wound. °· You notice a bad smell coming from the wound.   °· Your wound breaks open. °MAKE SURE YOU: °· Understand these instructions. °· Will watch your condition. °· Will get help right away if you are not doing well or get worse. °  °This information is not intended to replace advice given to you by your health care provider. Make sure you discuss any questions you have with your health care provider. °  °Document Released: 05/18/2004 Document Revised: 05/01/2014 Document Reviewed: 10/30/2012 °Elsevier Interactive Patient Education ©2016 [employer] ° °

## 2015-04-09 NOTE — ED Notes (Signed)
Patient states she has laceration under first and second toe of left foot. States she cut it on a "mirror" last night. Bleeding controlled at triage.

## 2015-10-05 ENCOUNTER — Emergency Department (HOSPITAL_COMMUNITY)
Admission: EM | Admit: 2015-10-05 | Discharge: 2015-10-05 | Disposition: A | Discharge status: Home / Self Care | Payer: Medicare HMO | Attending: Emergency Medicine | Admitting: Emergency Medicine

## 2015-10-05 ENCOUNTER — Encounter (HOSPITAL_COMMUNITY): Payer: Self-pay | Admitting: Emergency Medicine

## 2015-10-05 DIAGNOSIS — M79671 Pain in right foot: Secondary | ICD-10-CM | POA: Diagnosis present

## 2015-10-05 DIAGNOSIS — I10 Essential (primary) hypertension: Secondary | ICD-10-CM | POA: Insufficient documentation

## 2015-10-05 DIAGNOSIS — M109 Gout, unspecified: Secondary | ICD-10-CM

## 2015-10-05 DIAGNOSIS — Z87891 Personal history of nicotine dependence: Secondary | ICD-10-CM | POA: Insufficient documentation

## 2015-10-05 DIAGNOSIS — M10071 Idiopathic gout, right ankle and foot: Secondary | ICD-10-CM | POA: Diagnosis not present

## 2015-10-05 DIAGNOSIS — Z79899 Other long term (current) drug therapy: Secondary | ICD-10-CM | POA: Insufficient documentation

## 2015-10-05 HISTORY — DX: Gout, unspecified: M10.9

## 2015-10-05 MED ORDER — NAPROXEN 375 MG PO TABS: 375.0000 mg | ORAL_TABLET | Freq: Two times a day (BID) | ORAL | Status: DC

## 2015-10-05 MED ORDER — HYDROCODONE-ACETAMINOPHEN 5-325 MG PO TABS: 1.0000 | ORAL_TABLET | Freq: Four times a day (QID) | ORAL | Status: DC | PRN

## 2015-10-05 NOTE — ED Provider Notes (Signed)
CSN: [id number]     Arrival date & time 10/05/15  1407 History   First MD Initiated Contact with Patient 10/05/15 1512     Chief Complaint  Patient presents with  . Gout     (Consider location/radiation/quality/duration/timing/severity/associated sxs/prior Treatment) HPI Patient presents with 2 days of right foot pain. She states the pain is mostly in the great toe. No trauma. Has previous history of gout. States feels similar. Is taking Tylenol home with some improvement. No fever chills. Past Medical History  Diagnosis Date  . Hypertension   . Gout    Past Surgical History  Procedure Laterality Date  . Tubal ligation    . Abdominal hysterectomy     No family history on file. Social History  Substance Use Topics  . Smoking status: Former Research scientist (life sciences)  . Smokeless tobacco: None  . Alcohol Use: No   OB History    No data available     Review of Systems  Constitutional: Negative for fever, chills and fatigue.  Respiratory: Negative for cough and shortness of breath.   Cardiovascular: Negative for chest pain.  Gastrointestinal: Negative for nausea, vomiting, abdominal pain and diarrhea.  Musculoskeletal: Positive for arthralgias.  Skin: Negative for rash and wound.  Neurological: Negative for dizziness, weakness, light-headedness, numbness and headaches.  All other systems reviewed and are negative.     Allergies  Codeine and Penicillins  Home Medications   Prior to Admission medications   Medication Sig Start Date End Date Taking? Authorizing Provider  acetaminophen (TYLENOL) 500 MG tablet Take 500 mg by mouth every 6 (six) hours as needed for mild pain.    Yes Historical Provider, MD  Calcium Carbonate-Vitamin D (CALCIUM + D PO) Take 1 tablet by mouth daily.   Yes Historical Provider, MD  lisinopril-hydrochlorothiazide (PRINZIDE,ZESTORETIC) 20-12.5 MG per tablet Take 1 tablet by mouth daily.   Yes Historical Provider, MD  LORazepam (ATIVAN) 2 MG tablet Take 2 mg by  mouth at bedtime.   Yes Historical Provider, MD  nabumetone (RELAFEN) 750 MG tablet Take 1 tablet by mouth 2 (two) times daily as needed for mild pain.  09/15/15  Yes Historical Provider, MD  verapamil (CALAN) 120 MG tablet Take 1 tablet by mouth 2 (two) times daily. 08/16/15  Yes Historical Provider, MD  HYDROcodone-acetaminophen (NORCO) 5-325 MG tablet Take 1 tablet by mouth every 6 (six) hours as needed for severe pain. 10/05/15   Julianne Rice, MD  naproxen (NAPROSYN) 375 MG tablet Take 1 tablet (375 mg total) by mouth 2 (two) times daily. 10/05/15   Julianne Rice, MD   BP 140/112 mmHg  Pulse 75  Temp(Src) 98.7 F (37.1 C) (Oral)  Resp 18  Ht 5' (1.524 m)  Wt 160 lb (72.576 kg)  BMI 31.25 kg/m2  SpO2 97% Physical Exam  Constitutional: She is oriented to person, place, and time. She appears well-developed and well-nourished. No distress.  HENT:  Head: Normocephalic and atraumatic.  Mouth/Throat: Oropharynx is clear and moist.  Eyes: EOM are normal. Pupils are equal, round, and reactive to light.  Neck: Normal range of motion. Neck supple.  Cardiovascular: Normal rate and regular rhythm.   Pulmonary/Chest: Effort normal and breath sounds normal. No respiratory distress. She has no wheezes. She has no rales.  Abdominal: Soft. Bowel sounds are normal. She exhibits no distension and no mass. There is no tenderness. There is no rebound and no guarding.  Musculoskeletal: Normal range of motion. She exhibits tenderness. She exhibits no edema.  Patient  with tenderness palpation over the first MTP of the right foot. There are small warmth. There is no obvious deformity. Distal pulses are equal intact. Good distal cap refill.  Neurological: She is alert and oriented to person, place, and time.  Ambulatory. Sensation fully intact. 5/5 motor in all extremities  Skin: Skin is warm and dry. No rash noted. No erythema.  Psychiatric: She has a normal mood and affect. Her behavior is normal.   Nursing note and vitals reviewed.   ED Course  Procedures (including critical care time) Labs Review Labs Reviewed - No data to display  Imaging Review No results found. I have personally reviewed and evaluated these images and lab results as part of my medical decision-making.   EKG Interpretation None      MDM   Final diagnoses:  Acute gout of right foot, unspecified cause    Exam consistent with gout. Stable vital signs. We'll discharge home to follow-up with primary physician. Return precautions given.    Julianne Rice, MD 10/05/15 [phone]

## 2015-10-05 NOTE — ED Notes (Signed)
Pt c/o RT foot pain. States she has hx of gout. Pt reports difficulty ambulating due to pain.

## 2015-10-05 NOTE — Discharge Instructions (Signed)

## 2015-12-01 ENCOUNTER — Other Ambulatory Visit (HOSPITAL_COMMUNITY): Payer: Self-pay | Admitting: Family Medicine

## 2015-12-01 DIAGNOSIS — Z1231 Encounter for screening mammogram for malignant neoplasm of breast: Secondary | ICD-10-CM

## 2015-12-02 ENCOUNTER — Encounter (HOSPITAL_COMMUNITY): Payer: Self-pay

## 2015-12-02 ENCOUNTER — Ambulatory Visit (HOSPITAL_COMMUNITY)
Admission: RE | Admit: 2015-12-02 | Discharge: 2015-12-02 | Disposition: A | Discharge status: Home / Self Care | Payer: Medicare HMO | Source: Ambulatory Visit | Attending: Family Medicine | Admitting: Family Medicine

## 2015-12-02 DIAGNOSIS — Z1231 Encounter for screening mammogram for malignant neoplasm of breast: Secondary | ICD-10-CM | POA: Insufficient documentation

## 2016-02-15 ENCOUNTER — Other Ambulatory Visit (HOSPITAL_COMMUNITY): Payer: Self-pay | Admitting: Family Medicine

## 2016-03-20 ENCOUNTER — Ambulatory Visit (HOSPITAL_COMMUNITY): Payer: Medicare HMO

## 2016-03-31 ENCOUNTER — Ambulatory Visit (HOSPITAL_COMMUNITY)
Admission: RE | Admit: 2016-03-31 | Discharge: 2016-03-31 | Disposition: A | Discharge status: Home / Self Care | Payer: Medicare HMO | Source: Ambulatory Visit | Attending: Family Medicine | Admitting: Family Medicine

## 2016-03-31 DIAGNOSIS — Z1231 Encounter for screening mammogram for malignant neoplasm of breast: Secondary | ICD-10-CM | POA: Insufficient documentation

## 2017-12-23 ENCOUNTER — Emergency Department (HOSPITAL_COMMUNITY)
Admission: EM | Admit: 2017-12-23 | Discharge: 2017-12-23 | Disposition: A | Discharge status: Home / Self Care | Payer: Medicare HMO | Attending: Emergency Medicine | Admitting: Emergency Medicine

## 2017-12-23 ENCOUNTER — Other Ambulatory Visit: Payer: Self-pay

## 2017-12-23 ENCOUNTER — Emergency Department (HOSPITAL_COMMUNITY): Payer: Medicare HMO

## 2017-12-23 ENCOUNTER — Encounter (HOSPITAL_COMMUNITY): Payer: Self-pay | Admitting: Emergency Medicine

## 2017-12-23 DIAGNOSIS — Z87891 Personal history of nicotine dependence: Secondary | ICD-10-CM | POA: Diagnosis not present

## 2017-12-23 DIAGNOSIS — M25572 Pain in left ankle and joints of left foot: Secondary | ICD-10-CM | POA: Diagnosis present

## 2017-12-23 DIAGNOSIS — Z79899 Other long term (current) drug therapy: Secondary | ICD-10-CM | POA: Insufficient documentation

## 2017-12-23 DIAGNOSIS — M109 Gout, unspecified: Secondary | ICD-10-CM

## 2017-12-23 DIAGNOSIS — M10072 Idiopathic gout, left ankle and foot: Secondary | ICD-10-CM | POA: Insufficient documentation

## 2017-12-23 DIAGNOSIS — I10 Essential (primary) hypertension: Secondary | ICD-10-CM | POA: Insufficient documentation

## 2017-12-23 IMAGING — DX DG ANKLE COMPLETE 3+V*L*
3 series · 3 of 3 positions shown · non-contrast
Comparison: None.

CLINICAL DATA: Left ankle pain and swelling

EXAM:
LEFT ANKLE COMPLETE - 3+ VIEW

[ankle ap]
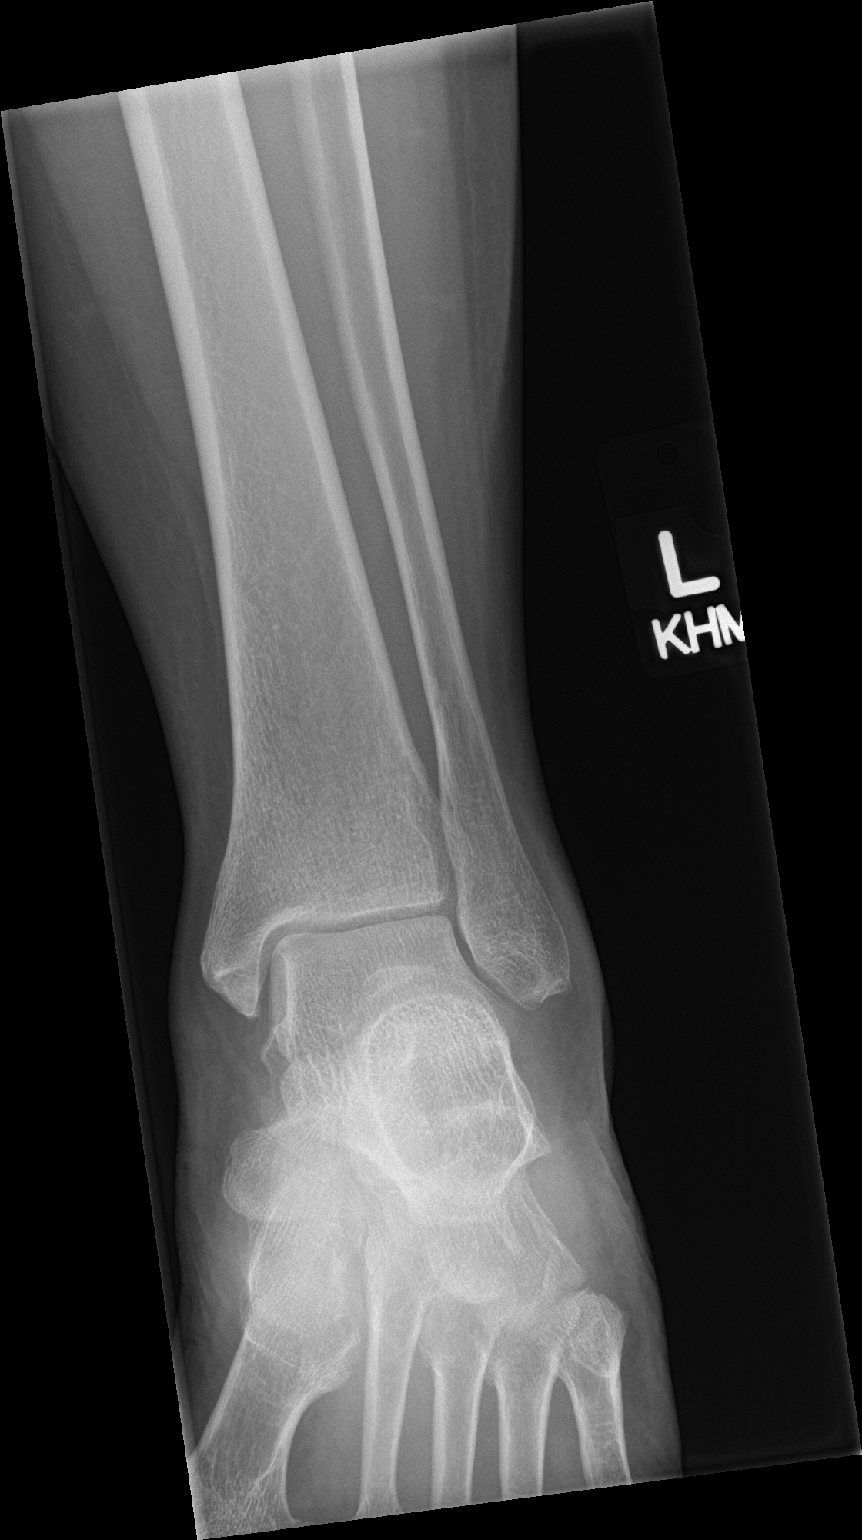

[ankle obl]
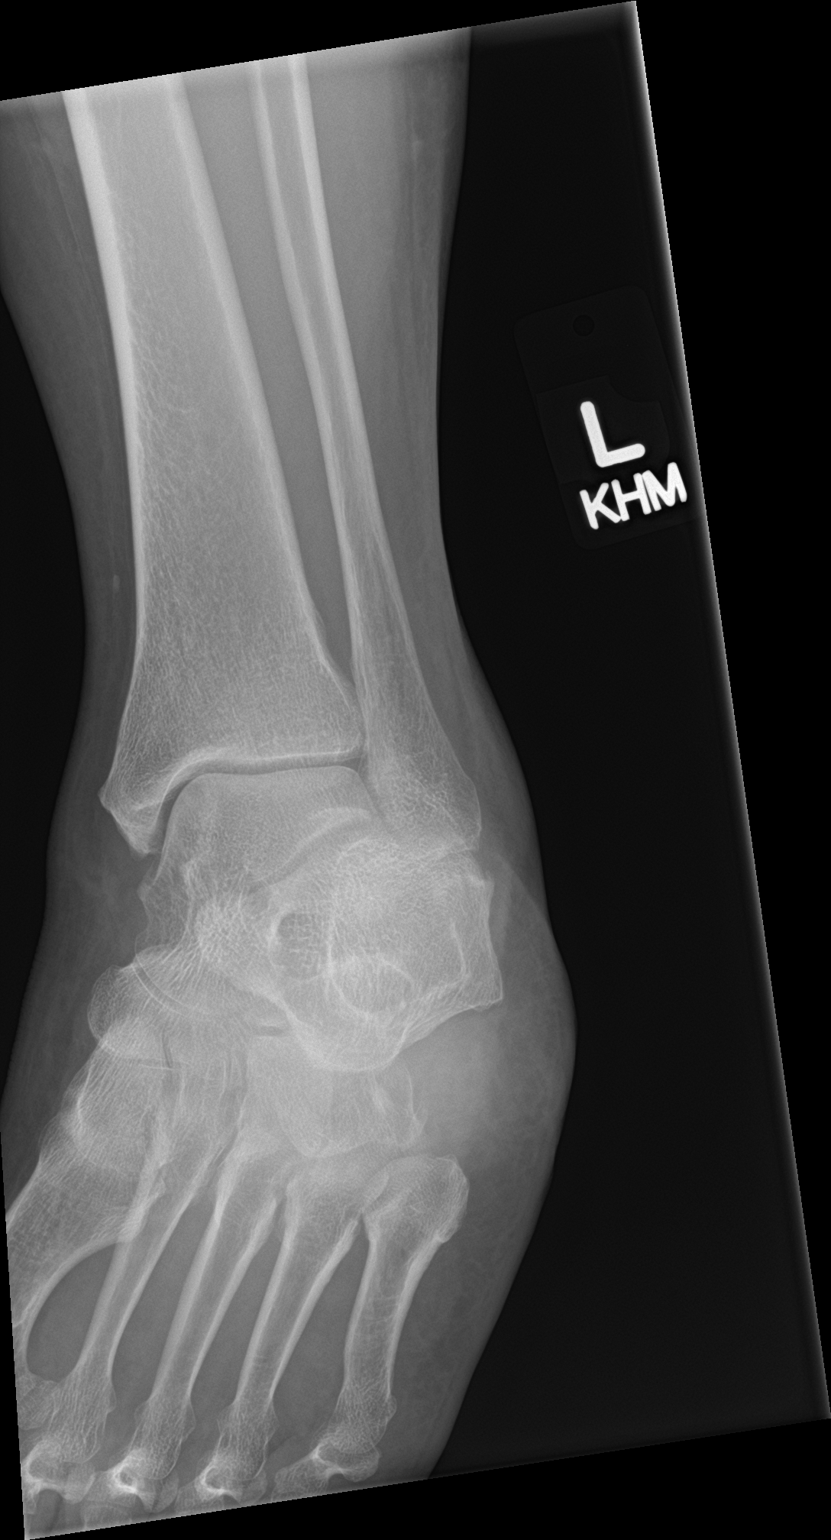

[ankle lat]
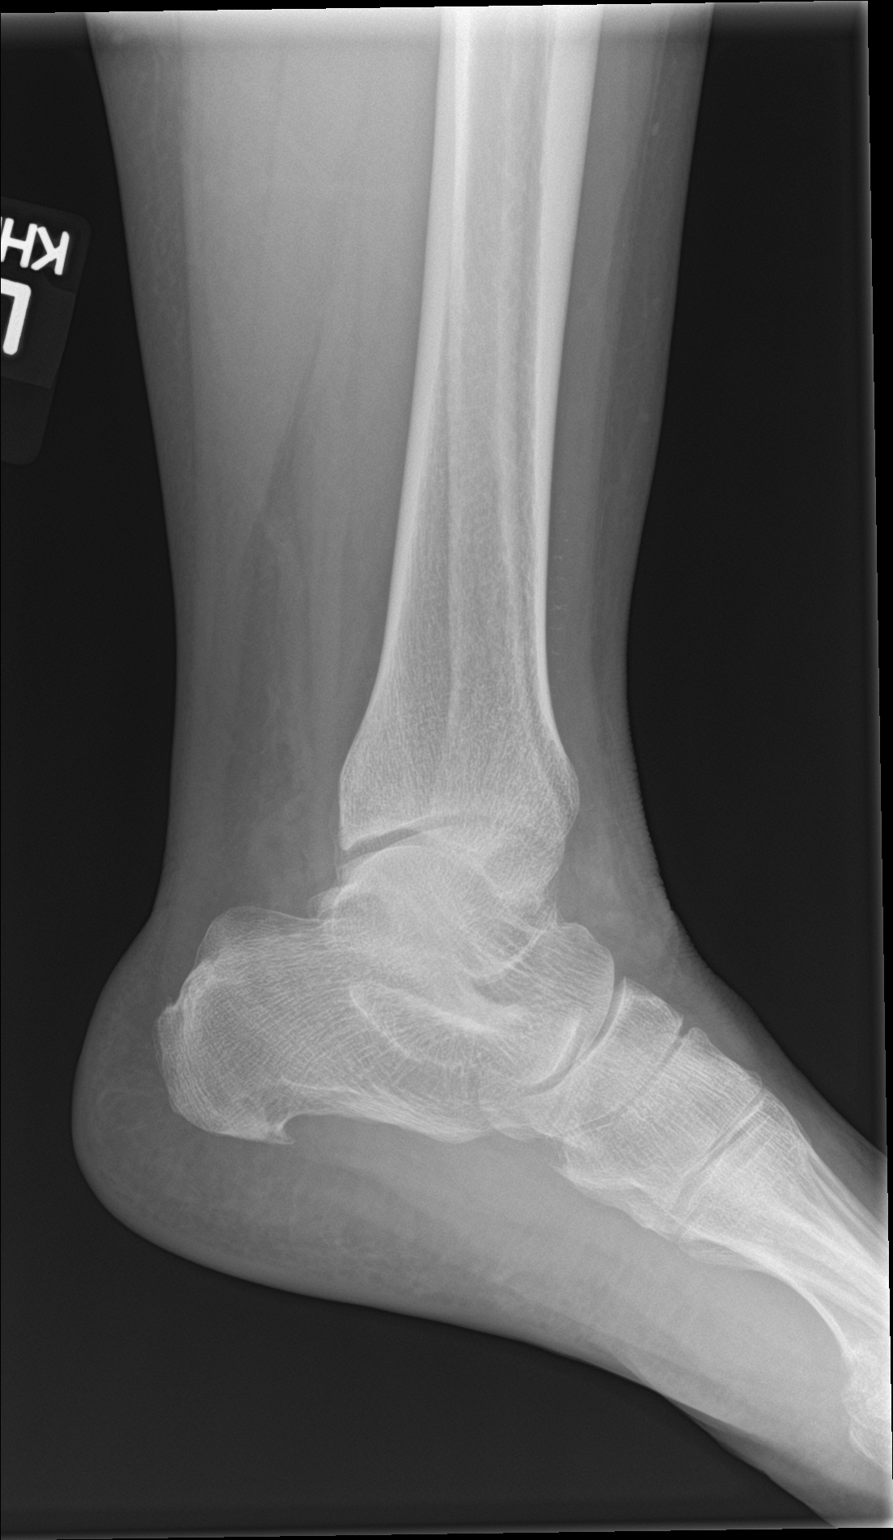

[3 of 3 positions shown; findings below may reference images not displayed]

FINDINGS: Normal alignment. No acute osseous finding or joint abnormality.
Left distal tibia, fibula, talus and calcaneus appear intact. Small
plantar calcaneal spur.
IMPRESSION: No acute osseous finding

## 2017-12-23 MED ORDER — TRAMADOL HCL 50 MG PO TABS: 50.0000 mg | ORAL_TABLET | Freq: Four times a day (QID) | ORAL | 0 refills | Status: DC | PRN

## 2017-12-23 MED ORDER — NAPROXEN 375 MG PO TABS: 375.0000 mg | ORAL_TABLET | Freq: Two times a day (BID) | ORAL | 0 refills | Status: DC

## 2017-12-23 NOTE — ED Triage Notes (Signed)
Pt reports left ankle pain x 2 days with no injury.  Lateral swelling.  Pain only with walking.

## 2017-12-23 NOTE — ED Provider Notes (Signed)
[facility] EMERGENCY DEPARTMENT Provider Note   CSN: [id number] Arrival date & time: 12/23/17  1505     History   Chief Complaint Chief Complaint  Patient presents with  . Ankle Pain    HPI Allison Watson is a 77 y.o. female.  HPI   Allison SIHARATH is a 77 y.o. female who presents to the Emergency Department complaining of left ankle pain and swelling for 2 days.  She describes a throbbing, shooting pain to her lateral left ankle that radiates into her lateral lower leg with weight bearing and dorsiflexion.  She reports a history of gout and pain feels somewhat similar.  She denies injury, pain to her foot or numbness of her foot, toes or lower leg.  No redness or open wounds.     Past Medical History:  Diagnosis Date  . Gout   . Hypertension     Patient Active Problem List   Diagnosis Date Noted  . INSOMNIA, CHRONIC 05/08/2007  . BRONCHITIS, ACUTE WITH BRONCHOSPASM 04/16/2007  . CELLULITIS/ABSCESS, TRUNK 10/31/2006  . MUSCLE SPASM 10/31/2006  . HYPERTENSION 03/13/2006  . ALLERGIC RHINITIS 03/13/2006  . Osteoarthrosis, unspecified whether generalized or localized, unspecified site 03/13/2006  . ARTHRITIS 03/13/2006    Past Surgical History:  Procedure Laterality Date  . ABDOMINAL HYSTERECTOMY    . TUBAL LIGATION       OB History   None      Home Medications    Prior to Admission medications   Medication Sig Start Date End Date Taking? Authorizing Provider  acetaminophen (TYLENOL) 500 MG tablet Take 500 mg by mouth every 6 (six) hours as needed for mild pain.     [provider]  Calcium Carbonate-Vitamin D (CALCIUM + D PO) Take 1 tablet by mouth daily.    [provider]  HYDROcodone-acetaminophen (NORCO) 5-325 MG tablet Take 1 tablet by mouth every 6 (six) hours as needed for severe pain. 10/05/15   Julianne Rice, MD  lisinopril-hydrochlorothiazide (PRINZIDE,ZESTORETIC) 20-12.5 MG per tablet Take 1 tablet by mouth daily.    [provider]  LORazepam (ATIVAN) 2 MG tablet Take 2 mg by mouth at bedtime.    [provider]  nabumetone (RELAFEN) 750 MG tablet Take 1 tablet by mouth 2 (two) times daily as needed for mild pain.  09/15/15   [provider]  naproxen (NAPROSYN) 375 MG tablet Take 1 tablet (375 mg total) by mouth 2 (two) times daily with a meal. 12/23/17   Lyda Colcord, PA-C  traMADol (ULTRAM) 50 MG tablet Take 1 tablet (50 mg total) by mouth every 6 (six) hours as needed. 12/23/17   Mlissa Tamayo, PA-C  verapamil (CALAN) 120 MG tablet Take 1 tablet by mouth 2 (two) times daily. 08/16/15   [provider]    Family History History reviewed. No pertinent family history.  Social History Social History   Tobacco Use  . Smoking status: Former Smoker  Substance Use Topics  . Alcohol use: No  . Drug use: No     Allergies   Codeine and Penicillins   Review of Systems Review of Systems  Constitutional: Negative for chills and fever.  Respiratory: Negative for shortness of breath.   Cardiovascular: Negative for chest pain and leg swelling.  Musculoskeletal: Positive for arthralgias (left ankle pain) and joint swelling.  Skin: Negative for color change and wound.  Neurological: Negative for dizziness, weakness, numbness and headaches.  Hematological: Negative for adenopathy.  All other systems reviewed  and are negative.    Physical Exam Updated Vital Signs BP 131/67 (BP Location: Right Arm)   Pulse 80   Temp 97.8 F (36.6 C) (Temporal)   Resp 16   Ht 5' (1.524 m)   Wt 72.6 kg   SpO2 100%   BMI 31.25 kg/m   Physical Exam  Constitutional: She appears well-developed and well-nourished. No distress.  HENT:  Head: Atraumatic.  Mouth/Throat: Oropharynx is clear and moist.  Cardiovascular: Normal rate, regular rhythm and intact distal pulses.  Pulmonary/Chest: Effort normal and breath sounds normal. No respiratory distress.  Musculoskeletal: She exhibits edema  and tenderness. She exhibits no deformity.  ttp of the lateral left ankle.  Mild edema noted.  Pain reproduced with dorsiflexion.  Left lower leg is soft and non-tender.  Left foot also non-tender  Neurological: She is alert. No sensory deficit.  Skin: Skin is warm. Capillary refill takes less than 2 seconds. No erythema.  Nursing note and vitals reviewed.    ED Treatments / Results  Labs (all labs ordered are listed, but only abnormal results are displayed) Labs Reviewed - No data to display  EKG None  Radiology Dg Ankle Complete Left  Result Date: 12/23/2017 CLINICAL DATA:  Left ankle pain and swelling EXAM: LEFT ANKLE COMPLETE - 3+ VIEW COMPARISON:  None. FINDINGS: Normal alignment. No acute osseous finding or joint abnormality. Left distal tibia, fibula, talus and calcaneus appear intact. Small plantar calcaneal spur. IMPRESSION: No acute osseous finding Electronically Signed   By: Jerilynn Mages.  Shick M.D.   On: 12/23/2017 15:40    Procedures Procedures (including critical care time)  Medications Ordered in ED Medications - No data to display   Initial Impression / Assessment and Plan / ED Course  I have reviewed the triage vital signs and the nursing notes.  Pertinent labs & imaging results that were available during my care of the patient were reviewed by me and considered in my medical decision making (see chart for details).     XR neg for bony abnormality.  NV intact.  Compartments soft, no reported injury.  Sx's likely related to gout. No concerning sx's to suggest cellulitis.  Pt agrees to tx plan with elevation, orthotic support when needed and orthopedic f/u.    Final Clinical Impressions(s) / ED Diagnoses   Final diagnoses:  Acute gout of left ankle, unspecified cause    ED Discharge Orders         Ordered    naproxen (NAPROSYN) 375 MG tablet  2 times daily with meals     12/23/17 1620    traMADol (ULTRAM) 50 MG tablet  Every 6 hours PRN     12/23/17 1620             Kem Parkinson, PA-C 12/23/17 1633    Julianne Rice, MD 12/23/17 2306

## 2017-12-23 NOTE — Discharge Instructions (Addendum)
Wear the ankle brace as needed for support.  Elevate your foot when possible.  Follow-up with your doctor or call the orthopedic provider listed to arrange a follow-up appt in a week if the pain is not improving

## 2018-02-05 ENCOUNTER — Encounter (HOSPITAL_COMMUNITY): Payer: Self-pay

## 2018-02-05 ENCOUNTER — Emergency Department (HOSPITAL_COMMUNITY)
Admission: EM | Admit: 2018-02-05 | Discharge: 2018-02-05 | Disposition: A | Discharge status: Home / Self Care | Payer: Medicare HMO | Attending: Emergency Medicine | Admitting: Emergency Medicine

## 2018-02-05 ENCOUNTER — Other Ambulatory Visit: Payer: Self-pay

## 2018-02-05 DIAGNOSIS — R7989 Other specified abnormal findings of blood chemistry: Secondary | ICD-10-CM | POA: Diagnosis not present

## 2018-02-05 DIAGNOSIS — N611 Abscess of the breast and nipple: Secondary | ICD-10-CM

## 2018-02-05 DIAGNOSIS — Z87891 Personal history of nicotine dependence: Secondary | ICD-10-CM | POA: Diagnosis not present

## 2018-02-05 DIAGNOSIS — I10 Essential (primary) hypertension: Secondary | ICD-10-CM | POA: Diagnosis not present

## 2018-02-05 DIAGNOSIS — R21 Rash and other nonspecific skin eruption: Secondary | ICD-10-CM | POA: Diagnosis present

## 2018-02-05 DIAGNOSIS — Z79899 Other long term (current) drug therapy: Secondary | ICD-10-CM | POA: Insufficient documentation

## 2018-02-05 LAB — BASIC METABOLIC PANEL
ANION GAP: 8 (ref 5–15)
BUN: 17 mg/dL (ref 8–23)
CO2: 26 mmol/L (ref 22–32)
Calcium: 9.6 mg/dL (ref 8.9–10.3)
Chloride: 106 mmol/L (ref 98–111)
Creatinine, Ser: 1.34 mg/dL — ABNORMAL HIGH (ref 0.44–1.00)
GFR, EST AFRICAN AMERICAN: 44 mL/min — AB (ref >60)
GFR, EST NON AFRICAN AMERICAN: 38 mL/min — AB (ref >60)
GLUCOSE: 99 mg/dL (ref 70–99)
POTASSIUM: 4.1 mmol/L (ref 3.5–5.1)
Sodium: 140 mmol/L (ref 135–145)

## 2018-02-05 MED ORDER — CLINDAMYCIN HCL 150 MG PO CAPS
300.0000 mg | ORAL_CAPSULE | Freq: Once | ORAL | Status: AC
  Administered 2018-02-05: 300 mg via ORAL
  Filled 2018-02-05: qty 2

## 2018-02-05 MED ORDER — CLINDAMYCIN HCL 150 MG PO CAPS
ORAL_CAPSULE | ORAL | Status: AC
  Filled 2018-02-05: qty 1

## 2018-02-05 MED ORDER — TRAMADOL HCL 50 MG PO TABS: 50.0000 mg | ORAL_TABLET | Freq: Four times a day (QID) | ORAL | 0 refills | Status: DC | PRN

## 2018-02-05 MED ORDER — SULFAMETHOXAZOLE-TRIMETHOPRIM 800-160 MG PO TABS: 1.0000 | ORAL_TABLET | Freq: Two times a day (BID) | ORAL | 0 refills | Status: AC

## 2018-02-05 NOTE — ED Triage Notes (Signed)
Pt has a rash on the side of her breast that has been there for years. Yesterday she noticed bleeding and infection to the rash. States they are sore.

## 2018-02-05 NOTE — Discharge Instructions (Addendum)
Please see the information and instructions below regarding your visit.  Your diagnoses today include:  1. Breast abscess   2. Elevated blood pressure reading in office with diagnosis of hypertension   3. Elevated serum creatinine     Abscesses form when an infection in your skin starts to collect bacteria and white blood cells, walling it off from the rest of your body to protect you from a bigger infection. Risk factors for this type of infection include:  ?Break in the skin ?Diabetes ?Swollen areas  Sometimes the infection starts to spread to surrounding tissue, causing redness and swelling. We call this cellulitis.   Tests performed today include: See side panel of your discharge paperwork for testing performed today. Vital signs are listed at the bottom of these instructions.   Medications prescribed:    Take any prescribed medications only as prescribed, and any over the counter medications only as directed on the packaging.  1. Please take all of your antibiotics until finished.   You may develop abdominal discomfort or nausea from the antibiotic. If this occurs, you may take it with food. Some patients also get diarrhea with antibiotics. You may help offset this with probiotics which you can buy or get in yogurt. Do not eat or take the probiotics until 2 hours after your antibiotic. Some women develop vaginal yeast infections after antibiotics. If you develop unusual vaginal discharge after being on this medication, please see your primary care provider.   Some people develop allergies to antibiotics. Symptoms of antibiotic allergy can be mild and include a flat rash and itching. They can also be more serious and include:  ?Hives - Hives are raised, red patches of skin that are usually very itchy.  ?Lip or tongue swelling  ?Trouble swallowing or breathing  ?Blistering of the skin or mouth.  If you have any of these serious symptoms, please seek emergency medical care  immediately.    2. You have been prescribed Tramadol for pain. This is an opioid pain medication. You may take this medication every 6 hours as needed for pain. Only take this medication if you need it for breakthrough pain.  Do not combine this medication with Tylenol, as it may increase the risk of liver problems.  Do not combine this medication with alcohol.  Please be advised to avoid driving or operating heavy machinery while taking this medication, as it may make you drowsy or impair judgment.    Home care instructions:  Please follow any educational materials contained in this packet.   Some things that may promote healing of your wound and infection include:  Raise your arm or leg to reduce swelling - Raise the arm or leg up above the level of your heart 3 or 4 times a day, for 30 minutes each time. Keep the infected area clean and dry.You may clean with warm water and soap. You can take a shower or bath, but be sure to pat the area dry with a towel afterward. Do not put any antibiotic ointments or creams on the area. Reapply a dry gauze dressing any time the bandage has become soaked with drainage, or after cleansing the wound.  Apply warm compresses to the wound 3-4 times daily to encourage drainage.   Return instructions:  Please return to the Emergency Department if you experience worsening symptoms. You should return for reevaluation of your infection if you notice spreading redness, increased swelling, an abscess develops, or you develop signs and symptoms of a  systemic illness such as fever and chills.  Please return if you have any other emergent concerns.  Additional Information:   Your vital signs today were: BP (!) 143/78 (BP Location: Right Arm)    Pulse 63    Temp 98.3 F (36.8 C) (Oral)    Resp 12    SpO2 100%  If your blood pressure (BP) was elevated on multiple readings during this visit above 130 for the top number or above 80 for the bottom number, please  have this repeated by your primary care provider within one month. --------------  Thank you for allowing [place] to participate in your care today.

## 2018-02-05 NOTE — ED Provider Notes (Addendum)
[facility] EMERGENCY DEPARTMENT Provider Note   CSN: [id number] Arrival date & time: 02/05/18  1700     History   Chief Complaint Chief Complaint  Patient presents with  . Rash    HPI Allison Watson is a 77 y.o. female.  HPI  Patient is a 77 year old female with a history of gout and hypertension presenting for rash of the right breast.  Patient reports that she has had lesions on her right breast for "years", but over the past couple days, she has had 2 lesions that began to drain and are especially painful.  Patient reports that she has applied an unknown cream to the lesions without relief.  Patient denies any fevers, chills, history of immunocompromise status, nausea, vomiting, chest pain or shortness of breath.  Past Medical History:  Diagnosis Date  . Gout   . Hypertension     Patient Active Problem List   Diagnosis Date Noted  . INSOMNIA, CHRONIC 05/08/2007  . BRONCHITIS, ACUTE WITH BRONCHOSPASM 04/16/2007  . CELLULITIS/ABSCESS, TRUNK 10/31/2006  . MUSCLE SPASM 10/31/2006  . HYPERTENSION 03/13/2006  . ALLERGIC RHINITIS 03/13/2006  . Osteoarthrosis, unspecified whether generalized or localized, unspecified site 03/13/2006  . ARTHRITIS 03/13/2006    Past Surgical History:  Procedure Laterality Date  . ABDOMINAL HYSTERECTOMY    . TUBAL LIGATION       OB History   None      Home Medications    Prior to Admission medications   Medication Sig Start Date End Date Taking? Authorizing Provider  acetaminophen (TYLENOL) 500 MG tablet Take 500 mg by mouth every 6 (six) hours as needed for mild pain.     [provider]  Calcium Carbonate-Vitamin D (CALCIUM + D PO) Take 1 tablet by mouth daily.    [provider]  HYDROcodone-acetaminophen (NORCO) 5-325 MG tablet Take 1 tablet by mouth every 6 (six) hours as needed for severe pain. 10/05/15   Julianne Rice, MD  lisinopril-hydrochlorothiazide (PRINZIDE,ZESTORETIC) 20-12.5 MG per tablet Take  1 tablet by mouth daily.    [provider]  LORazepam (ATIVAN) 2 MG tablet Take 2 mg by mouth at bedtime.    [provider]  nabumetone (RELAFEN) 750 MG tablet Take 1 tablet by mouth 2 (two) times daily as needed for mild pain.  09/15/15   [provider]  naproxen (NAPROSYN) 375 MG tablet Take 1 tablet (375 mg total) by mouth 2 (two) times daily with a meal. 12/23/17   Triplett, Tammy, PA-C  traMADol (ULTRAM) 50 MG tablet Take 1 tablet (50 mg total) by mouth every 6 (six) hours as needed. 12/23/17   Triplett, Tammy, PA-C  verapamil (CALAN) 120 MG tablet Take 1 tablet by mouth 2 (two) times daily. 08/16/15   [provider]    Family History No family history on file.  Social History Social History   Tobacco Use  . Smoking status: Former Smoker  Substance Use Topics  . Alcohol use: No  . Drug use: No     Allergies   Codeine and Penicillins   Review of Systems Review of Systems  Constitutional: Negative for chills and fever.  Respiratory: Negative for shortness of breath.   Cardiovascular: Negative for chest pain.  Gastrointestinal: Negative for nausea and vomiting.  Skin: Positive for color change and rash.     Physical Exam Updated Vital Signs BP (!) 143/78 (BP Location: Right Arm)   Pulse 63   Temp 98.3 F (36.8 C) (Oral)  Resp 12   SpO2 100%   Physical Exam  Constitutional: She appears well-developed and well-nourished. No distress.  Sitting comfortably in bed.  HENT:  Head: Normocephalic and atraumatic.  Eyes: Conjunctivae are normal. Right eye exhibits no discharge. Left eye exhibits no discharge.  EOMs normal to gross examination.  Neck: Normal range of motion.  Cardiovascular: Normal rate and regular rhythm.  Intact, 2+ radial pulse.  Pulmonary/Chest: Effort normal and breath sounds normal. She has no wheezes. She has no rales.  Normal respiratory effort. Patient converses comfortably. No audible wheeze or stridor.    Abdominal: She exhibits no distension.  Musculoskeletal: Normal range of motion.  Neurological: She is alert.  Cranial nerves intact to gross observation. Patient moves extremities without difficulty.  Skin: Skin is warm and dry. She is not diaphoretic.  Breast exam performed with RN chaperone, Brandi present.  Patient has 2 actively draining fluctuant lesions of right lateral breast, each approximately 1 cm in diameter.  Minimal surrounding erythema, but induration extending approximately 4 cm in greatest diameter of each lesion.  Patient has keloids present of the right lateral breast.  Psychiatric: She has a normal mood and affect. Her behavior is normal. Judgment and thought content normal.  Nursing note and vitals reviewed.    ED Treatments / Results  Labs (all labs ordered are listed, but only abnormal results are displayed) Labs Reviewed  BASIC METABOLIC PANEL - Abnormal; Notable for the following components:      Result Value   Creatinine, Ser 1.34 (*)    GFR calc non Af Amer 38 (*)    GFR calc Af Amer 44 (*)    All other components within normal limits    EKG None  Radiology No results found.  Procedures Procedures (including critical care time)  Medications Ordered in ED Medications - No data to display   Initial Impression / Assessment and Plan / ED Course  I have reviewed the triage vital signs and the nursing notes.  Pertinent labs & imaging results that were available during my care of the patient were reviewed by me and considered in my medical decision making (see chart for details).  Clinical Course as of Feb 05 2058  Tue Feb 05, 2018  2042 CKD stable. Will avoid Bactrim but do not need to renally dose Augmentin.   Creatinine(!): 1.34 [AM]  2059 I have reviewed the patient's information in the [facility] for the past 12 months and found them to have no overlapping Rx.  Opiates were prescribed for an acute, painful  condition. The patient was given information on side effects and encouraged to use other, non-opiate pain medication primary, only using opiate medicine sparingly for severe pain.   [AM]    Clinical Course User Index [AM] Albesa Seen, PA-C    Patient is nontoxic-appearing, afebrile, and without systemic symptoms.  Patient with actively draining breast abscesses.  Case was discussed with Dr. Aviva Signs of general surgery over the phone, who recommends that patient follow-up in office.  Recommends Bactrim as antibiotic.  Patient has penicillin allergy, precluding Augmentin use.  Renal function was reassessed today, and GFR is 44, stable from 2015.  Will proceed with Bactrim, lowest dose.  Stressed the importance of warm compresses and cleansing the area.  Return precautions were given for any increasing erythema, drainage, or fever or chills.  Patient is in understanding and agrees with plan of care.  Final Clinical Impressions(s) / ED Diagnoses  Final diagnoses:  Breast abscess  Elevated blood pressure reading in office with diagnosis of hypertension  Elevated serum creatinine    ED Discharge Orders    None       Tamala Julian 02/05/18 2057    Langston Masker B, PA-C 02/05/18 2059    Margette Fast, MD 02/06/18 1003

## 2018-02-05 NOTE — ED Notes (Signed)
Instructed pt to take all of antibiotics as prescribed. 

## 2018-12-14 ENCOUNTER — Encounter (HOSPITAL_COMMUNITY): Payer: Self-pay

## 2018-12-14 ENCOUNTER — Other Ambulatory Visit: Payer: Self-pay

## 2018-12-14 ENCOUNTER — Emergency Department (HOSPITAL_COMMUNITY)
Admission: EM | Admit: 2018-12-14 | Discharge: 2018-12-14 | Disposition: A | Discharge status: Home / Self Care | Payer: Medicare HMO | Attending: Emergency Medicine | Admitting: Emergency Medicine

## 2018-12-14 DIAGNOSIS — Z4800 Encounter for change or removal of nonsurgical wound dressing: Secondary | ICD-10-CM | POA: Insufficient documentation

## 2018-12-14 DIAGNOSIS — I10 Essential (primary) hypertension: Secondary | ICD-10-CM | POA: Diagnosis not present

## 2018-12-14 DIAGNOSIS — Z87891 Personal history of nicotine dependence: Secondary | ICD-10-CM | POA: Insufficient documentation

## 2018-12-14 DIAGNOSIS — Z5189 Encounter for other specified aftercare: Secondary | ICD-10-CM

## 2018-12-14 DIAGNOSIS — N611 Abscess of the breast and nipple: Secondary | ICD-10-CM | POA: Diagnosis not present

## 2018-12-14 MED ORDER — DOXYCYCLINE HYCLATE 100 MG PO CAPS: 100.0000 mg | ORAL_CAPSULE | Freq: Two times a day (BID) | ORAL | 0 refills | Status: DC

## 2018-12-14 MED ORDER — DOXYCYCLINE HYCLATE 100 MG PO TABS
100.0000 mg | ORAL_TABLET | Freq: Once | ORAL | Status: AC
  Administered 2018-12-14: 100 mg via ORAL
  Filled 2018-12-14: qty 1

## 2018-12-14 MED ORDER — TRAMADOL HCL 50 MG PO TABS: 50.0000 mg | ORAL_TABLET | Freq: Four times a day (QID) | ORAL | 0 refills | Status: DC | PRN

## 2018-12-14 MED ORDER — TRAMADOL HCL 50 MG PO TABS
50.0000 mg | ORAL_TABLET | Freq: Once | ORAL | Status: AC
  Administered 2018-12-14: 50 mg via ORAL
  Filled 2018-12-14: qty 1

## 2018-12-14 NOTE — ED Provider Notes (Signed)
[facility] EMERGENCY DEPARTMENT Provider Note   CSN: [id number] Arrival date & time: 12/14/18  1730     History   Chief Complaint Chief Complaint  Patient presents with  . Wound Check    HPI Allison Watson is a 78 y.o. female.     Patient is had a long history of some recurrent skin boils.  Predominantly occurring on her breast.  Both left and right.  Currently there is 2 boils she states on the lower part of her right breast.  With some pain.  She has had problems in that area before or near that area and also problems with her left breast as well.  This is a long-term problem.  With a flareup today.     Past Medical History:  Diagnosis Date  . Gout   . Hypertension     Patient Active Problem List   Diagnosis Date Noted  . INSOMNIA, CHRONIC 05/08/2007  . BRONCHITIS, ACUTE WITH BRONCHOSPASM 04/16/2007  . CELLULITIS/ABSCESS, TRUNK 10/31/2006  . MUSCLE SPASM 10/31/2006  . HYPERTENSION 03/13/2006  . ALLERGIC RHINITIS 03/13/2006  . Osteoarthrosis, unspecified whether generalized or localized, unspecified site 03/13/2006  . ARTHRITIS 03/13/2006    Past Surgical History:  Procedure Laterality Date  . ABDOMINAL HYSTERECTOMY    . TUBAL LIGATION       OB History   No obstetric history on file.      Home Medications    Prior to Admission medications   Medication Sig Start Date End Date Taking? Authorizing Provider  acetaminophen (TYLENOL) 500 MG tablet Take 500 mg by mouth every 6 (six) hours as needed for mild pain.     [provider]  Calcium Carbonate-Vitamin D (CALCIUM + D PO) Take 1 tablet by mouth daily.    [provider]  doxycycline (VIBRAMYCIN) 100 MG capsule Take 1 capsule (100 mg total) by mouth 2 (two) times daily. 12/14/18   Fredia Sorrow, MD  HYDROcodone-acetaminophen (NORCO) 5-325 MG tablet Take 1 tablet by mouth every 6 (six) hours as needed for severe pain. 10/05/15   Julianne Rice, MD  lisinopril-hydrochlorothiazide  (PRINZIDE,ZESTORETIC) 20-12.5 MG per tablet Take 1 tablet by mouth daily.    [provider]  LORazepam (ATIVAN) 2 MG tablet Take 2 mg by mouth at bedtime.    [provider]  nabumetone (RELAFEN) 750 MG tablet Take 1 tablet by mouth 2 (two) times daily as needed for mild pain.  09/15/15   [provider]  naproxen (NAPROSYN) 375 MG tablet Take 1 tablet (375 mg total) by mouth 2 (two) times daily with a meal. 12/23/17   Triplett, Tammy, PA-C  traMADol (ULTRAM) 50 MG tablet Take 1 tablet (50 mg total) by mouth every 6 (six) hours as needed. 02/05/18   Langston Masker B, PA-C  traMADol (ULTRAM) 50 MG tablet Take 1 tablet (50 mg total) by mouth every 6 (six) hours as needed. 12/14/18   Fredia Sorrow, MD  verapamil (CALAN) 120 MG tablet Take 1 tablet by mouth 2 (two) times daily. 08/16/15   [provider]    Family History No family history on file.  Social History Social History   Tobacco Use  . Smoking status: Former Research scientist (life sciences)  . Smokeless tobacco: Never Used  Substance Use Topics  . Alcohol use: No  . Drug use: No     Allergies   Codeine and Penicillins   Review of Systems Review of Systems  Constitutional: Negative for chills and fever.  HENT: Negative  for congestion, rhinorrhea and sore throat.   Eyes: Negative for visual disturbance.  Respiratory: Negative for cough and shortness of breath.   Cardiovascular: Negative for chest pain and leg swelling.  Gastrointestinal: Negative for abdominal pain, diarrhea, nausea and vomiting.  Genitourinary: Negative for dysuria.  Musculoskeletal: Negative for back pain and neck pain.  Skin: Positive for wound. Negative for rash.  Neurological: Negative for dizziness, light-headedness and headaches.  Hematological: Does not bruise/bleed easily.  Psychiatric/Behavioral: Negative for confusion.     Physical Exam Updated Vital Signs BP (!) 147/81 (BP Location: Right Arm)   Pulse 65   Temp 98.2 F (36.8  C) (Oral)   Resp 18   Ht 1.524 m (5')   Wt 117.9 kg   SpO2 99%   BMI 50.78 kg/m   Physical Exam Vitals signs and nursing note reviewed.  Constitutional:      General: She is not in acute distress.    Appearance: Normal appearance. She is well-developed.  HENT:     Head: Normocephalic and atraumatic.  Eyes:     Extraocular Movements: Extraocular movements intact.     Conjunctiva/sclera: Conjunctivae normal.     Pupils: Pupils are equal, round, and reactive to light.  Neck:     Musculoskeletal: Normal range of motion and neck supple.  Cardiovascular:     Rate and Rhythm: Normal rate and regular rhythm.     Heart sounds: No murmur.  Pulmonary:     Effort: Pulmonary effort is normal. No respiratory distress.     Breath sounds: Normal breath sounds.     Comments: Right breast area with 2 boils.  Side-by-side.  Area of induration measuring about 4 cm.  No fluctuance or evidence of deep abscess.  There is some erythema.  Seems to be superficial.  Patient's had recurrent problems in this area also up in the axillary area.  And also on the left breast as well. Abdominal:     Palpations: Abdomen is soft.     Tenderness: There is no abdominal tenderness.  Musculoskeletal: Normal range of motion.  Skin:    General: Skin is warm and dry.  Neurological:     General: No focal deficit present.     Mental Status: She is alert and oriented to person, place, and time.      ED Treatments / Results  Labs (all labs ordered are listed, but only abnormal results are displayed) Labs Reviewed - No data to display  EKG None  Radiology No results found.  Procedures Procedures (including critical care time)  Medications Ordered in ED Medications  doxycycline (VIBRA-TABS) tablet 100 mg (100 mg Oral Given 12/14/18 2234)  traMADol (ULTRAM) tablet 50 mg (50 mg Oral Given 12/14/18 2234)     Initial Impression / Assessment and Plan / ED Course  I have reviewed the triage vital signs and  the nursing notes.  Pertinent labs & imaging results that were available during my care of the patient were reviewed by me and considered in my medical decision making (see chart for details).        Patient with 2 superficial skin boils open on the right breast area.  With some induration no deep abscess or fluctuance.  Will treat with doxycycline.  And tramadol for pain and have her follow back up with her primary care doctor.  This is a recurrent problem for this patient.  Patient nontoxic no acute distress.  Also no concern for any neoplastic process.  But close follow-up  would be very important.  Final Clinical Impressions(s) / ED Diagnoses   Final diagnoses:  Wound check, abscess    ED Discharge Orders         Ordered    doxycycline (VIBRAMYCIN) 100 MG capsule  2 times daily     12/14/18 2256    traMADol (ULTRAM) 50 MG tablet  Every 6 hours PRN     12/14/18 2256           Fredia Sorrow, MD 12/14/18 2300

## 2018-12-14 NOTE — ED Triage Notes (Signed)
Pt has chronic wounds on her breast. States she has had them for years. Treats this with a cream, but cream is no longer working. Noticed that wound today is more open on her right breast.

## 2018-12-14 NOTE — ED Notes (Signed)
Wound dressed with 4x4s and tape.

## 2018-12-14 NOTE — Discharge Instructions (Addendum)
Make an appointment to follow-up with Dr. Cindie Laroche.  Take the antibiotic doxycycline twice a day for 7 days.  Take the tramadol as needed for pain.  No abscess requiring drainage at this point in time.  With that could change in the future.  Recommend that she get rechecked in about 3 days.  Return for any new or worse symptoms.

## 2019-02-04 ENCOUNTER — Emergency Department (HOSPITAL_COMMUNITY): Payer: Medicare HMO

## 2019-02-04 ENCOUNTER — Other Ambulatory Visit: Payer: Self-pay

## 2019-02-04 ENCOUNTER — Emergency Department (HOSPITAL_COMMUNITY)
Admission: EM | Admit: 2019-02-04 | Discharge: 2019-02-04 | Disposition: A | Discharge status: Home / Self Care | Payer: Medicare HMO | Attending: Emergency Medicine | Admitting: Emergency Medicine

## 2019-02-04 ENCOUNTER — Encounter (HOSPITAL_COMMUNITY): Payer: Self-pay | Admitting: Emergency Medicine

## 2019-02-04 DIAGNOSIS — Z79899 Other long term (current) drug therapy: Secondary | ICD-10-CM | POA: Insufficient documentation

## 2019-02-04 DIAGNOSIS — M25512 Pain in left shoulder: Secondary | ICD-10-CM | POA: Insufficient documentation

## 2019-02-04 DIAGNOSIS — L02211 Cutaneous abscess of abdominal wall: Secondary | ICD-10-CM | POA: Insufficient documentation

## 2019-02-04 DIAGNOSIS — Z87891 Personal history of nicotine dependence: Secondary | ICD-10-CM | POA: Diagnosis not present

## 2019-02-04 DIAGNOSIS — I10 Essential (primary) hypertension: Secondary | ICD-10-CM | POA: Insufficient documentation

## 2019-02-04 DIAGNOSIS — M19012 Primary osteoarthritis, left shoulder: Secondary | ICD-10-CM

## 2019-02-04 DIAGNOSIS — M1990 Unspecified osteoarthritis, unspecified site: Secondary | ICD-10-CM | POA: Insufficient documentation

## 2019-02-04 HISTORY — DX: Unspecified osteoarthritis, unspecified site: M19.90

## 2019-02-04 MED ORDER — DOXYCYCLINE HYCLATE 100 MG PO CAPS: 100.0000 mg | ORAL_CAPSULE | Freq: Two times a day (BID) | ORAL | 0 refills | Status: DC

## 2019-02-04 MED ORDER — PREDNISONE 20 MG PO TABS
40.0000 mg | ORAL_TABLET | Freq: Once | ORAL | Status: AC
  Administered 2019-02-04: 40 mg via ORAL
  Filled 2019-02-04: qty 2

## 2019-02-04 MED ORDER — HYDROCODONE-ACETAMINOPHEN 5-325 MG PO TABS: 1.0000 | ORAL_TABLET | ORAL | 0 refills | Status: DC | PRN

## 2019-02-04 MED ORDER — DEXAMETHASONE 4 MG PO TABS: 4.0000 mg | ORAL_TABLET | Freq: Two times a day (BID) | ORAL | 0 refills | Status: DC

## 2019-02-04 MED ORDER — HYDROCODONE-ACETAMINOPHEN 5-325 MG PO TABS
2.0000 | ORAL_TABLET | Freq: Once | ORAL | Status: DC
  Filled 2019-02-04: qty 2

## 2019-02-04 MED ORDER — ONDANSETRON HCL 4 MG PO TABS
4.0000 mg | ORAL_TABLET | Freq: Once | ORAL | Status: AC
  Administered 2019-02-04: 4 mg via ORAL
  Filled 2019-02-04: qty 1

## 2019-02-04 MED ORDER — DOXYCYCLINE HYCLATE 100 MG PO TABS
100.0000 mg | ORAL_TABLET | Freq: Once | ORAL | Status: AC
  Administered 2019-02-04: 100 mg via ORAL
  Filled 2019-02-04: qty 1

## 2019-02-04 NOTE — ED Notes (Signed)
Pt verbalized understanding of no driving and to use caution within 4 hours of taking pain meds due to meds cause drowsiness 

## 2019-02-04 NOTE — ED Triage Notes (Signed)
Pt c/o LT arm pain that radiates from shoulder into neck and down posterior arm x 2-3 days. Denies injury. Pt states it's a "burning pain." Pt also reports painful lesions/bumps on abdomen. States she is prone to abscesses.

## 2019-02-04 NOTE — ED Notes (Signed)
Pt states she is driving.  Pt not given hydrocodone due to pt driving home after discharge.

## 2019-02-04 NOTE — Discharge Instructions (Addendum)
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  There is no fracture, and no dislocation.  The examination of your stomach area suggest an inflamed/infected area, probably a small abscess.  Please soak your shoulder and the abdomen area in warm Epson salt water for 10 to 15 minutes daily.  Please use doxycycline 2 times daily with food until all taken for the abscess area.  Please discuss this with Dr. Cindie Laroche, as this may need a surgical intervention due to its location.  Use 2 Tylenol extra strength with breakfast, lunch, dinner, and at bedtime.  Use Decadron 2 times daily with food.  May use Norco for more severe pain. This medication may cause drowsiness. Please do not drink, drive, or participate in activity that requires concentration while taking this medication.  Please see Dr. Aline Brochure for orthopedic evaluation of your shoulder.

## 2019-02-04 NOTE — ED Provider Notes (Signed)
[facility] Provider Note   CSN: [id number] Arrival date & time: 02/04/19  1417     History   Chief Complaint Chief Complaint  Patient presents with  . Arm Pain    HPI Allison Watson is a 78 y.o. female.     Patient is a 78 year old female who presents to the emergency department with a complaint of left arm pain.  The patient states that this is been going on for the last 2 to 3 days.  She has been having increasing pain from the shoulder radiating into the neck.  The patient denies a recent fall or injury.  She reports that she has been told she had some arthritis changes, but she says this feels different and she is concerned that something more serious is going on.  No recent operations or procedures.  Heat helps, but does not resolve the issue.  Certain movement and range of motion causes the problem to be worse.  The history is provided by the patient.    Past Medical History:  Diagnosis Date  . Arthritis    Lt arm  . Gout   . Hypertension     Patient Active Problem List   Diagnosis Date Noted  . INSOMNIA, CHRONIC 05/08/2007  . BRONCHITIS, ACUTE WITH BRONCHOSPASM 04/16/2007  . CELLULITIS/ABSCESS, TRUNK 10/31/2006  . MUSCLE SPASM 10/31/2006  . HYPERTENSION 03/13/2006  . ALLERGIC RHINITIS 03/13/2006  . Osteoarthrosis, unspecified whether generalized or localized, unspecified site 03/13/2006  . ARTHRITIS 03/13/2006    Past Surgical History:  Procedure Laterality Date  . ABDOMINAL HYSTERECTOMY    . TUBAL LIGATION       OB History   No obstetric history on file.      Home Medications    Prior to Admission medications   Medication Sig Start Date End Date Taking? Authorizing Provider  acetaminophen (TYLENOL) 500 MG tablet Take 500 mg by mouth every 6 (six) hours as needed for mild pain.    Yes [provider]  allopurinol (ZYLOPRIM) 300 MG tablet Take 300 mg by mouth daily. 01/24/19  Yes [provider]  Calcium  Carbonate-Vitamin D (CALCIUM + D PO) Take 1 tablet by mouth daily.   Yes [provider]  lisinopril-hydrochlorothiazide (PRINZIDE,ZESTORETIC) 20-12.5 MG per tablet Take 1 tablet by mouth daily.   Yes [provider]  LORazepam (ATIVAN) 1 MG tablet Take 1 mg by mouth at bedtime. 01/08/19  Yes [provider]  nabumetone (RELAFEN) 750 MG tablet Take 1 tablet by mouth 2 (two) times daily.  09/15/15  Yes [provider]  verapamil (CALAN) 120 MG tablet Take 120 mg by mouth 2 (two) times daily.  08/16/15  Yes [provider]  doxycycline (VIBRAMYCIN) 100 MG capsule Take 1 capsule (100 mg total) by mouth 2 (two) times daily. Patient not taking: Reported on 02/04/2019 12/14/18   Fredia Sorrow, MD  traMADol (ULTRAM) 50 MG tablet Take 1 tablet (50 mg total) by mouth every 6 (six) hours as needed. Patient not taking: Reported on 02/04/2019 12/14/18   Fredia Sorrow, MD    Family History No family history on file.  Social History Social History   Tobacco Use  . Smoking status: Former Research scientist (life sciences)  . Smokeless tobacco: Never Used  Substance Use Topics  . Alcohol use: No  . Drug use: No     Allergies   Codeine and Penicillins   Review of Systems Review of Systems  Constitutional: Negative for activity change and appetite  change.  HENT: Negative for congestion, ear discharge, ear pain, facial swelling, nosebleeds, rhinorrhea, sneezing and tinnitus.   Eyes: Negative for photophobia, pain and discharge.  Respiratory: Negative for cough, choking, shortness of breath and wheezing.   Cardiovascular: Negative for chest pain, palpitations and leg swelling.  Gastrointestinal: Negative for abdominal pain, blood in stool, constipation, diarrhea, nausea and vomiting.  Genitourinary: Negative for difficulty urinating, dysuria, flank pain, frequency and hematuria.  Musculoskeletal: Positive for arthralgias. Negative for back pain, gait problem, myalgias and neck  pain.  Skin: Negative for color change, rash and wound.  Neurological: Negative for dizziness, seizures, syncope, facial asymmetry, speech difficulty, weakness and numbness.  Hematological: Negative for adenopathy. Does not bruise/bleed easily.  Psychiatric/Behavioral: Negative for agitation, confusion, hallucinations, self-injury and suicidal ideas. The patient is not nervous/anxious.      Physical Exam Updated Vital Signs BP (!) 150/87 (BP Location: Right Arm)   Pulse 73   Temp 98.5 F (36.9 C) (Oral)   Resp 16   Ht 5' (1.524 m)   Wt 117.9 kg   SpO2 100%   BMI 50.78 kg/m   Physical Exam Vitals signs and nursing note reviewed.  Constitutional:      Appearance: She is well-developed. She is not toxic-appearing.  HENT:     Head: Normocephalic.     Right Ear: Tympanic membrane and external ear normal.     Left Ear: Tympanic membrane and external ear normal.  Eyes:     General: Lids are normal.     Pupils: Pupils are equal, round, and reactive to light.  Neck:     Musculoskeletal: Normal range of motion and neck supple.     Vascular: No carotid bruit.  Cardiovascular:     Rate and Rhythm: Normal rate and regular rhythm.     Pulses: Normal pulses.     Heart sounds: Normal heart sounds.  Pulmonary:     Effort: No respiratory distress.     Breath sounds: Normal breath sounds.  Abdominal:     General: Bowel sounds are normal.     Palpations: Abdomen is soft.     Tenderness: There is no abdominal tenderness. There is no guarding.     Comments: Multiple abscesses noted of the lower abdominal wall.  No drainage.  No red streaking appreciated.  These are tender to palpation.  Musculoskeletal:     Left shoulder: She exhibits decreased range of motion and pain.       Arms:     Comments: Crepitus present with attempted range of motion of the shoulder.  There is decreased range of motion of the shoulder.  There is good range of motion of the left elbow, wrist and fingers.   Capillary refill is less than 2 seconds.  Lymphadenopathy:     Head:     Right side of head: No submandibular adenopathy.     Left side of head: No submandibular adenopathy.     Cervical: No cervical adenopathy.  Skin:    General: Skin is warm and dry.  Neurological:     Mental Status: She is alert and oriented to person, place, and time.     Cranial Nerves: No cranial nerve deficit.     Sensory: No sensory deficit.  Psychiatric:        Speech: Speech normal.      ED Treatments / Results  Labs (all labs ordered are listed, but only abnormal results are displayed) Labs Reviewed - No data to display  EKG  None  Radiology Dg Shoulder Left  Result Date: 02/04/2019 CLINICAL DATA:  Left shoulder pain.  No known injury. EXAM: LEFT SHOULDER - 2+ VIEW COMPARISON:  None. FINDINGS: Degenerative changes in the [facility] joint with joint space narrowing and spurring. Glenohumeral joint is maintained. No acute bony abnormality. Specifically, no fracture, subluxation, or dislocation. Soft tissues are intact. IMPRESSION: Degenerative changes in the left AC joint. No acute bony abnormality. Electronically Signed   By: Rolm Baptise M.D.   On: 02/04/2019 16:20    Procedures Procedures (including critical care time)  Medications Ordered in ED Medications - No data to display   Initial Impression / Assessment and Plan / ED Course  I have reviewed the triage vital signs and the nursing notes.  Pertinent labs & imaging results that were available during my care of the patient were reviewed by me and considered in my medical decision making (see chart for details).          Final Clinical Impressions(s) / ED Diagnoses MDM  Vital signs reviewed.  Pulse oximetry was within normal range from 95 to 100%.  Patient has decreased range of motion of the left shoulder with crepitus present. Patient also noted to have multiple abscess areas of the lower portion of the abdominal wall.  Patient will be  treated with doxycycline and warm tub soaks.  X-ray of the left shoulder shows degenerative changes in the left AC joint.  No other acute fracture, subluxation, or dislocation.  Patient made aware of the degenerative changes.  Patient treated in the emergency department for assistance with her pain and discomfort.  Patient is referred to orthopedics for additional evaluation and management.   Final diagnoses:  Acute pain of left shoulder  Primary osteoarthritis of left shoulder  Abdominal wall abscess    ED Discharge Orders         Ordered    HYDROcodone-acetaminophen (NORCO/VICODIN) 5-325 MG tablet  Every 4 hours PRN     02/04/19 2217    doxycycline (VIBRAMYCIN) 100 MG capsule  2 times daily     02/04/19 2217    dexamethasone (DECADRON) 4 MG tablet  2 times daily with meals     02/04/19 2217           Lily Kocher, PA-C 02/06/19 0021    Nat Christen, MD 02/06/19 1704

## 2019-02-17 ENCOUNTER — Other Ambulatory Visit (HOSPITAL_COMMUNITY): Payer: Self-pay | Admitting: Family Medicine

## 2019-02-17 ENCOUNTER — Other Ambulatory Visit: Payer: Self-pay

## 2019-02-17 ENCOUNTER — Other Ambulatory Visit: Payer: Self-pay | Admitting: Family Medicine

## 2019-02-17 ENCOUNTER — Ambulatory Visit (HOSPITAL_COMMUNITY)
Admission: RE | Admit: 2019-02-17 | Discharge: 2019-02-17 | Disposition: A | Discharge status: Home / Self Care | Payer: Medicare HMO | Source: Ambulatory Visit | Attending: Family Medicine | Admitting: Family Medicine

## 2019-02-17 DIAGNOSIS — M542 Cervicalgia: Secondary | ICD-10-CM | POA: Insufficient documentation

## 2019-02-17 IMAGING — MR MR CERVICAL SPINE W/O CM
4 of 5 series · 16 of 48 positions shown · non-contrast
Comparison: None.

CLINICAL DATA: Neck pain radiating into the right arm for the past
2 months.

EXAM:
MRI CERVICAL SPINE WITHOUT CONTRAST
TECHNIQUE: Multiplanar, multisequence MR imaging of the cervical spine was
performed. No intravenous contrast was administered.

[Series 3: T2 · sagittal · 3.0mm · 0.31mm/px · 6 of 13 slices shown (1 of 2)]
[im 1/13]
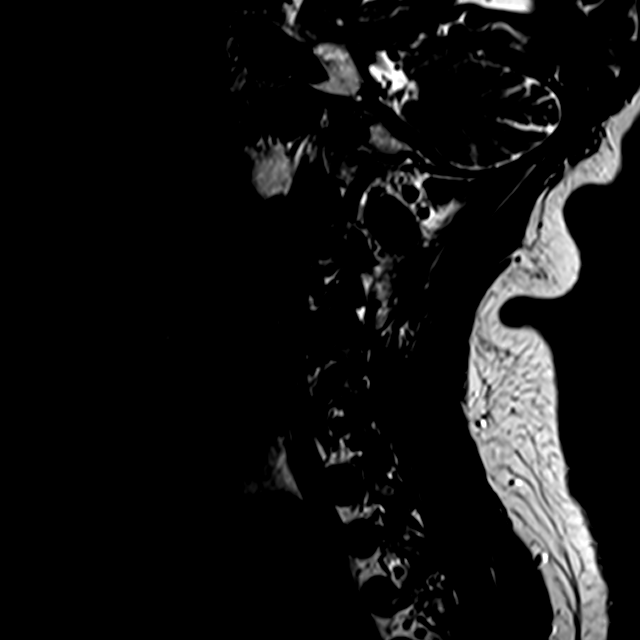
[im 3/13]
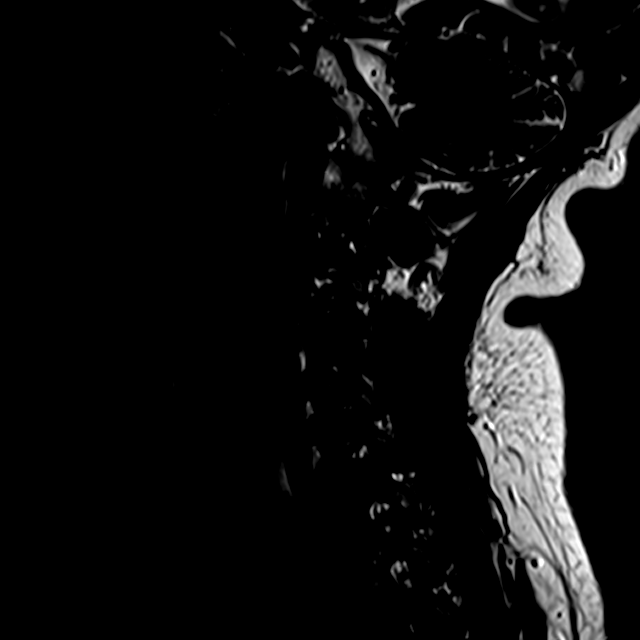
[im 5/13]
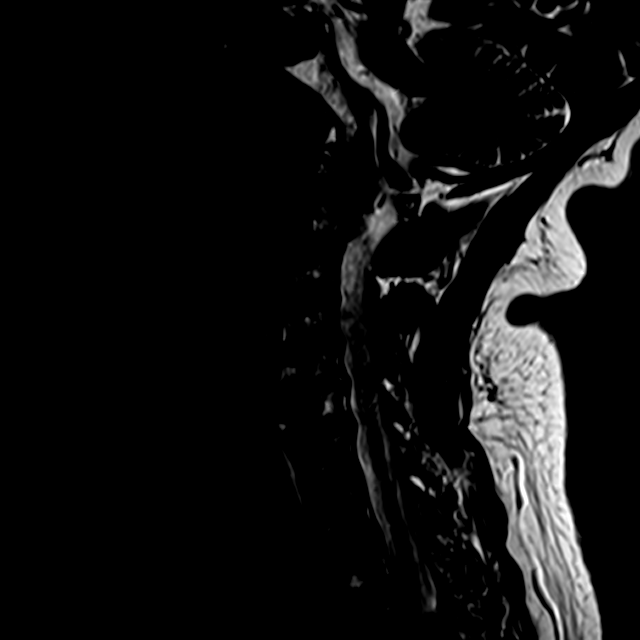
[im 8/13]
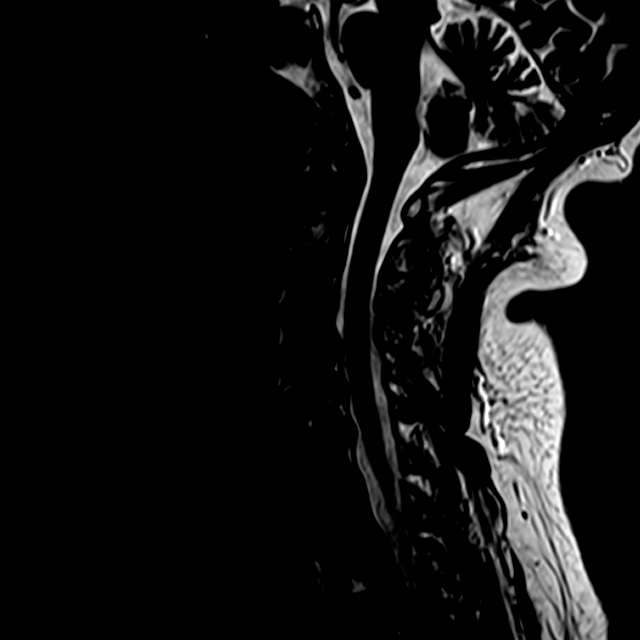
[im 10/13]
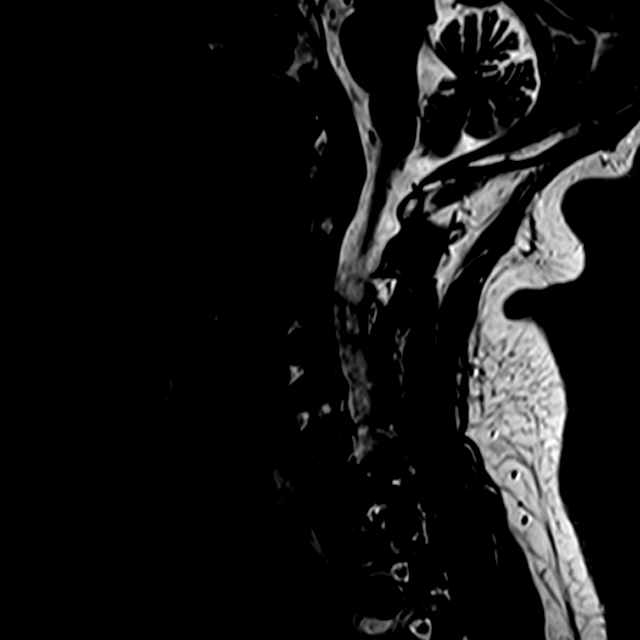
[im 13/13]
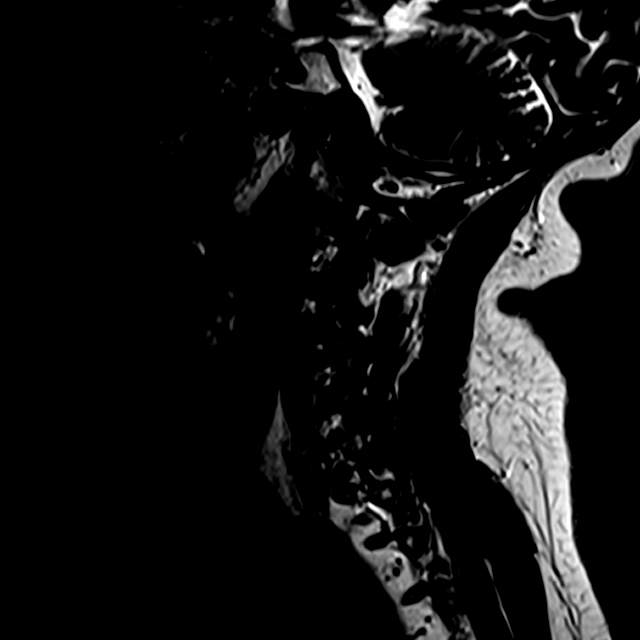

[Series 4: FLAIR · sagittal · 3.0mm · 0.62mm/px · 3 of 13 slices shown]
[im 3/13]
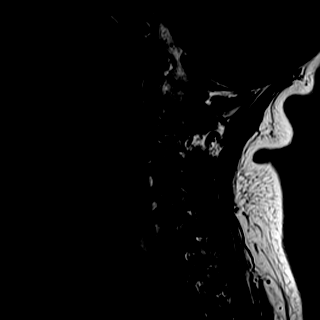
[im 7/13]
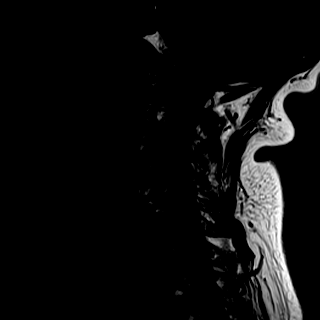
[im 11/13]
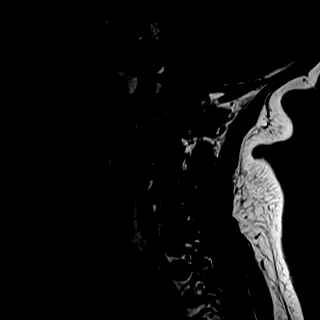

[Series 5: STIR · sagittal · 3.0mm · 0.31mm/px · 3 of 13 slices shown]
[im 3/13]
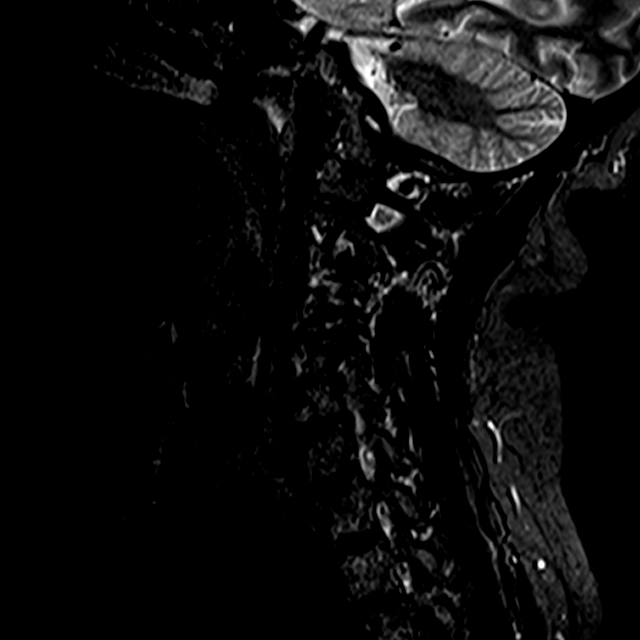
[im 7/13]
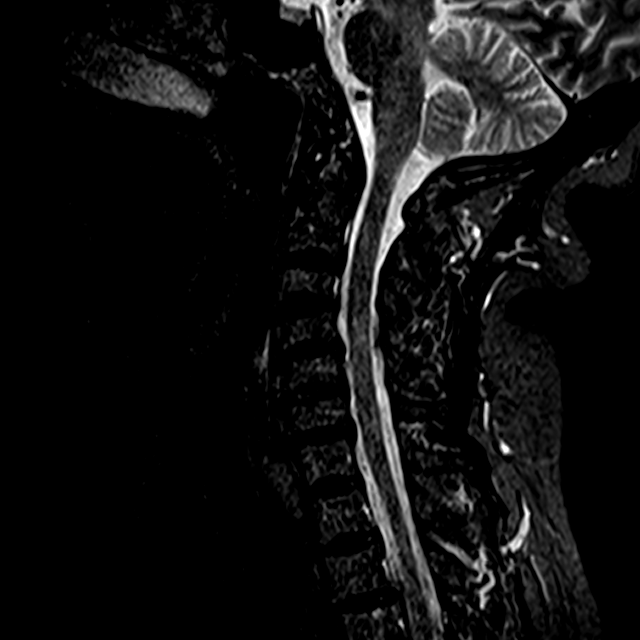
[im 11/13]
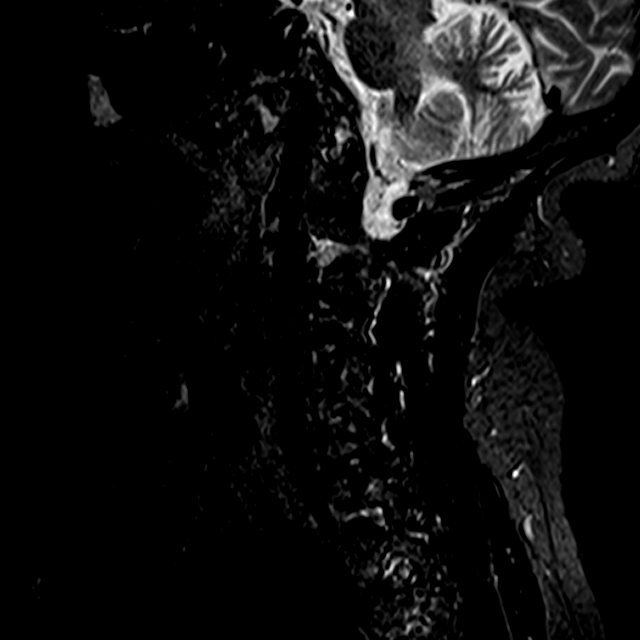

[Series 7: T2 · axial · 3.0mm · 0.18mm/px · z∈[-38,+31]mm · 4 of 28 slices shown (2 of 2)]
[im 1/28]
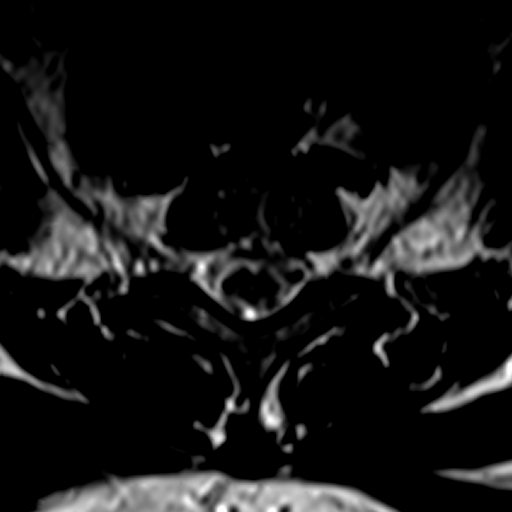
[im 5/28]
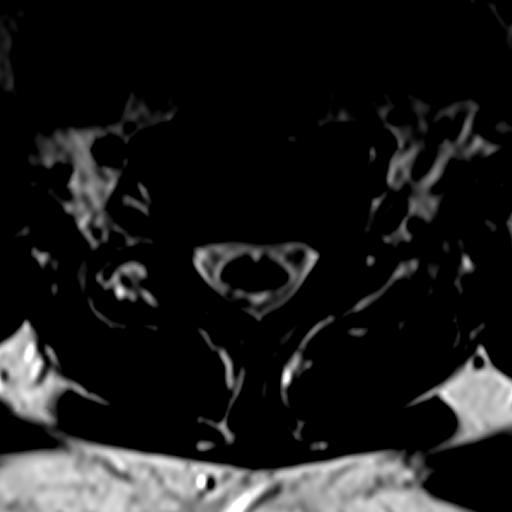
[im 15/28]
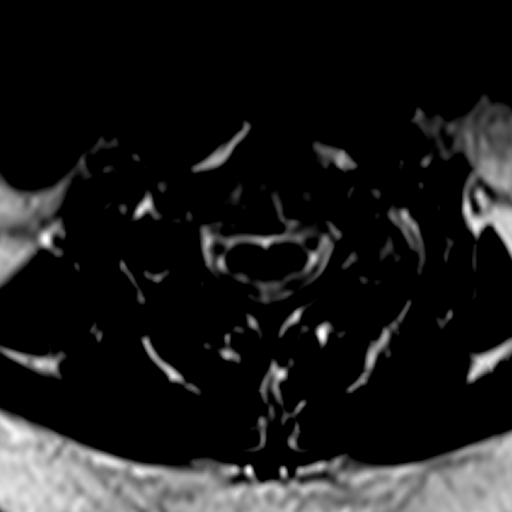
[im 23/28]
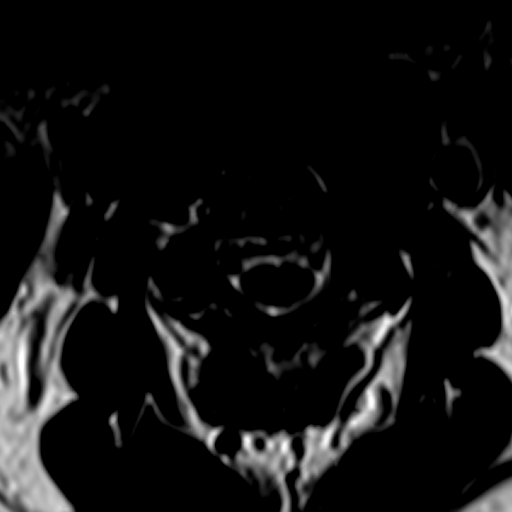

[16 of 48 positions shown; findings below may reference images not displayed]

FINDINGS: Alignment: Physiologic.

Vertebrae: No fracture, evidence of discitis, or bone lesion.

Cord: Normal signal and morphology.

Posterior Fossa, vertebral arteries, paraspinal tissues: Negative.

Disc levels:

C2-C3: Small shallow central disc protrusion and mild bilateral
facet uncovertebral hypertrophy. No stenosis.

C3-C4: Small shallow central disc protrusion. Mild right facet
uncovertebral hypertrophy. Moderate left facet arthropathy. Moderate
left and mild right neuroforaminal stenosis. No spinal canal
stenosis.

C4-C5: Mild diffuse disc bulging. Moderate right uncovertebral
hypertrophy. Moderate left facet arthropathy. Severe right and mild
left neuroforaminal stenosis. No spinal canal stenosis.

C5-C6: Small posterior disc osteophyte complex. Moderate bilateral
uncovertebral hypertrophy. Severe bilateral neuroforaminal stenosis.
No spinal canal stenosis.

C6-C7: Small posterior disc osteophyte complex. Moderate bilateral
uncovertebral hypertrophy. Moderate to severe bilateral
neuroforaminal stenosis. No spinal canal stenosis.

C7-T1: Negative disc. Mild left facet uncovertebral hypertrophy.
Mild left neuroforaminal stenosis. No spinal canal or right
neuroforaminal stenosis.
IMPRESSION: 1. Multilevel cervical spondylosis as described above. Advanced
neuroforaminal stenosis on the right at C4-C5 and bilaterally at
C5-C6 and C6-C7.

## 2019-02-20 DIAGNOSIS — M25519 Pain in unspecified shoulder: Secondary | ICD-10-CM | POA: Insufficient documentation

## 2019-04-15 ENCOUNTER — Other Ambulatory Visit (HOSPITAL_COMMUNITY): Payer: Self-pay | Admitting: Neurosurgery

## 2019-04-15 ENCOUNTER — Other Ambulatory Visit: Payer: Self-pay | Admitting: Neurosurgery

## 2019-04-15 DIAGNOSIS — M25512 Pain in left shoulder: Secondary | ICD-10-CM

## 2019-04-23 ENCOUNTER — Ambulatory Visit (HOSPITAL_COMMUNITY)
Admission: RE | Admit: 2019-04-23 | Discharge: 2019-04-23 | Disposition: A | Discharge status: Home / Self Care | Payer: Medicare HMO | Source: Ambulatory Visit | Attending: Neurosurgery | Admitting: Neurosurgery

## 2019-04-23 ENCOUNTER — Other Ambulatory Visit: Payer: Self-pay

## 2019-04-23 DIAGNOSIS — M25512 Pain in left shoulder: Secondary | ICD-10-CM | POA: Diagnosis not present

## 2019-04-23 IMAGING — MR MR SHOULDER*L* W/O CM
4 of 5 series · 19 of 40 positions shown · non-contrast
Comparison: None.

CLINICAL DATA: Left shoulder pain.  No known injury.

EXAM:
MRI OF THE LEFT SHOULDER WITHOUT CONTRAST
TECHNIQUE: Multiplanar, multisequence MR imaging of the shoulder was performed.
No intravenous contrast was administered.

[Series 6: PD fat-sat · axial · 4.0mm · 0.26mm/px · z∈[-37,+69]mm · 8 of 24 slices shown (1 of 2)]
[im 1/24]
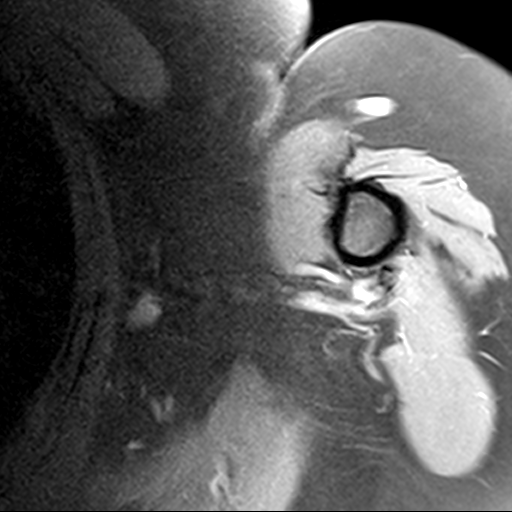
[im 4/24]
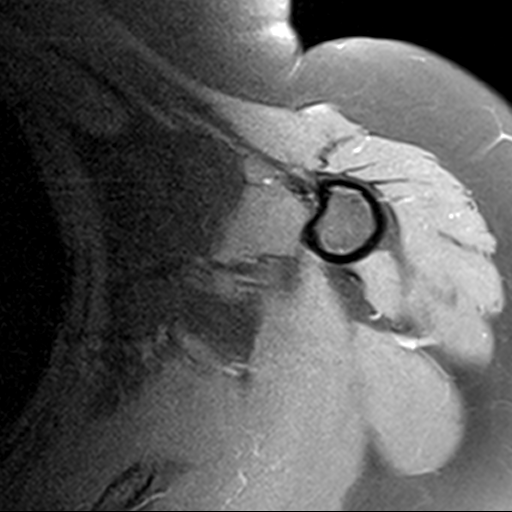
[im 7/24]
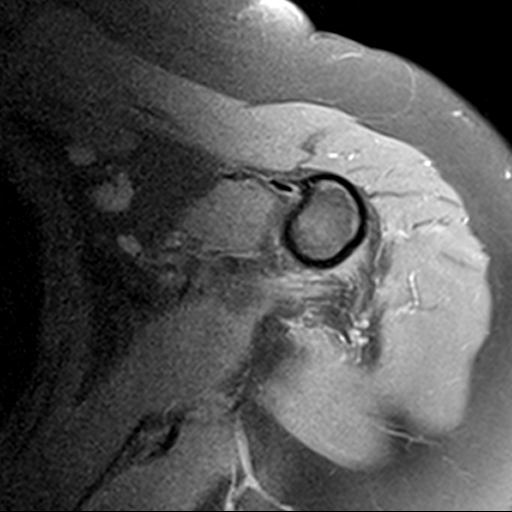
[im 10/24]
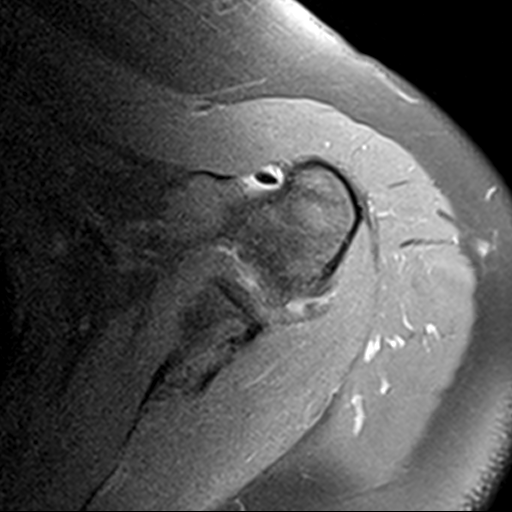
[im 14/24]
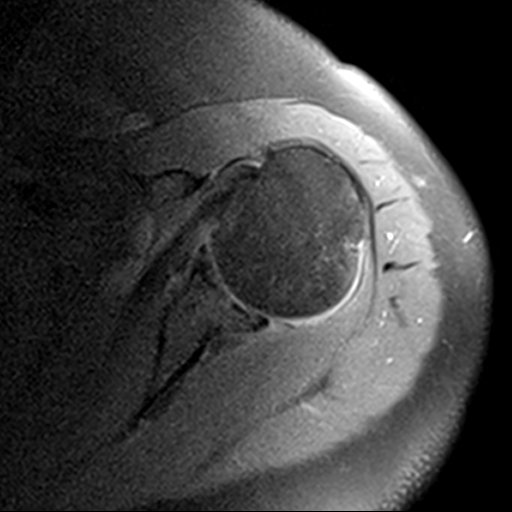
[im 17/24]
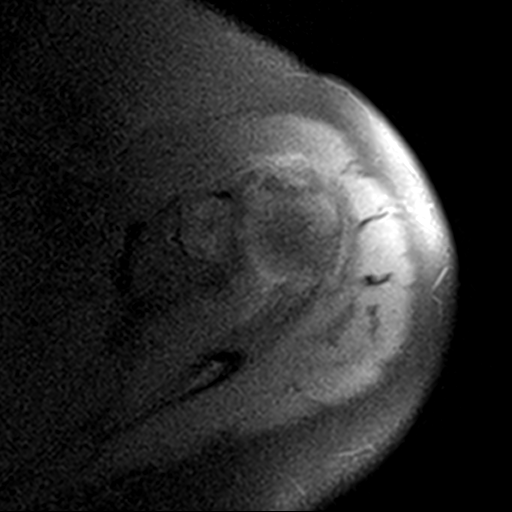
[im 20/24]
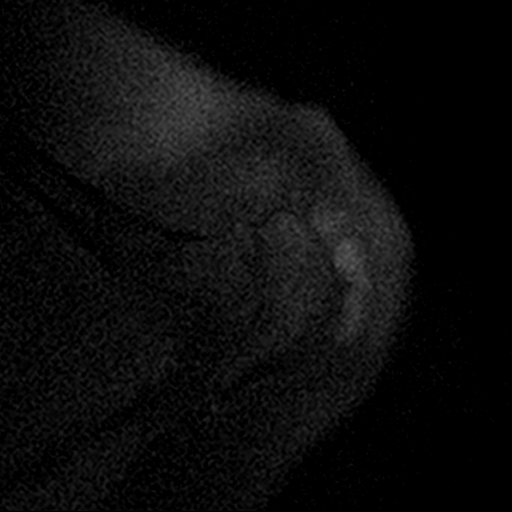
[im 24/24]
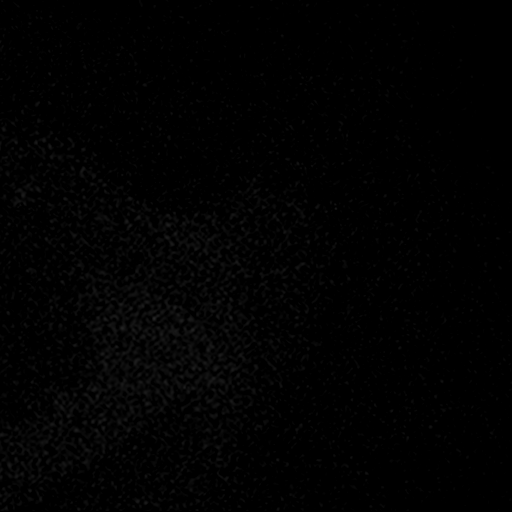

[Series 9: T2 fat-sat · oblique · 4.0mm · 0.28mm/px · 3 of 20 slices shown (1 of 2)]
[im 4/20]
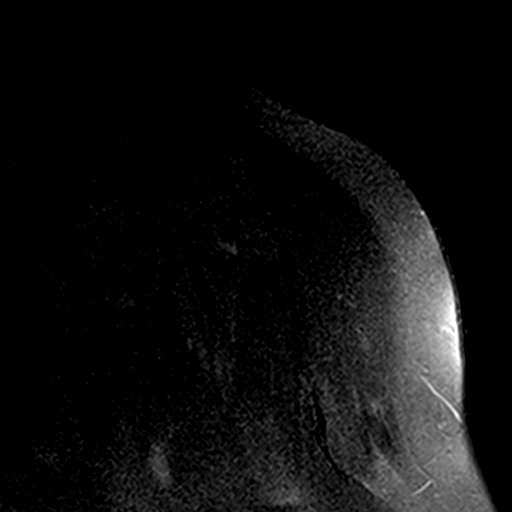
[im 10/20]
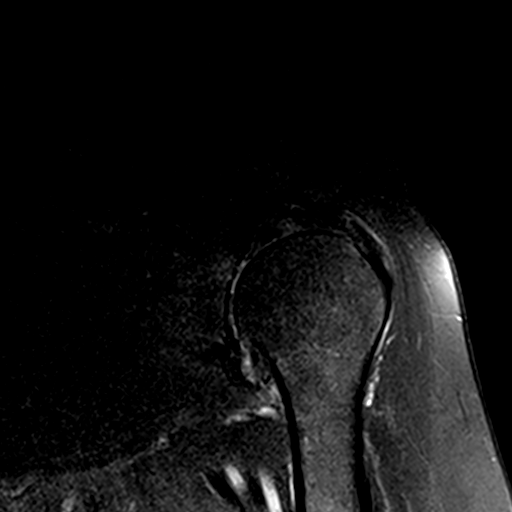
[im 16/20]
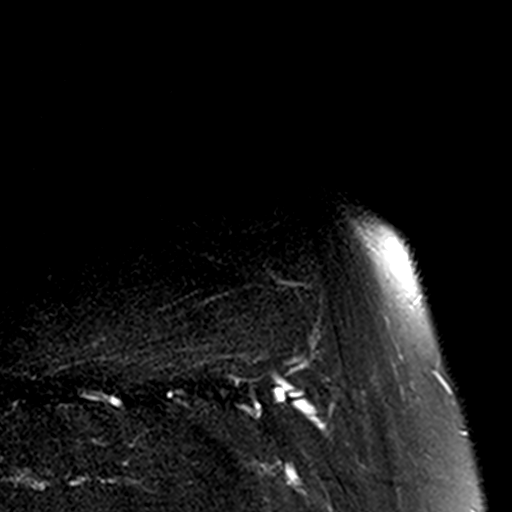

[Series 10: PD fat-sat · oblique · 4.0mm · 0.28mm/px · 5 of 20 slices shown (2 of 2)]
[im 1/20]
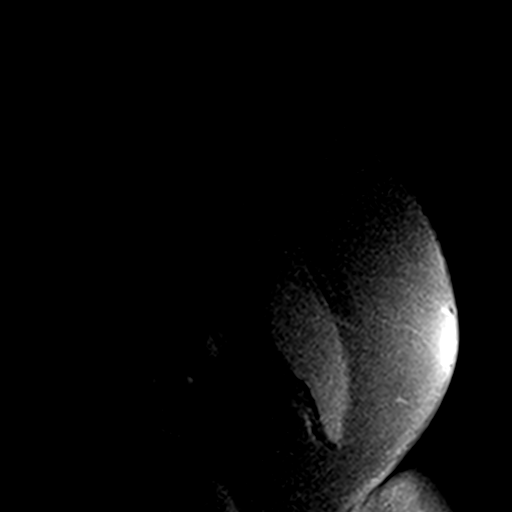
[im 4/20]
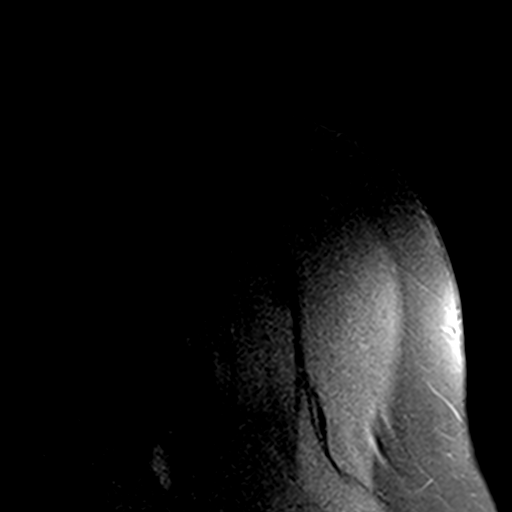
[im 7/20]
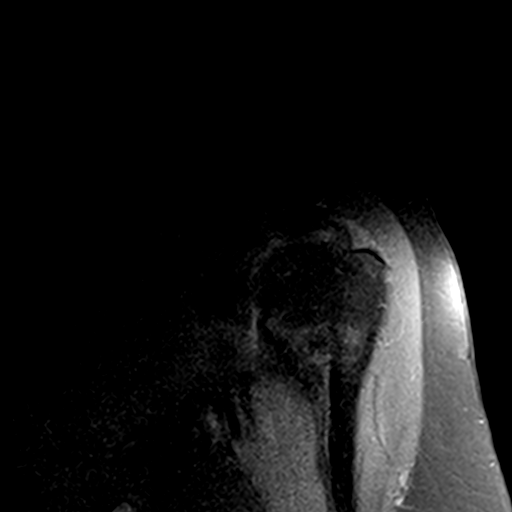
[im 10/20]
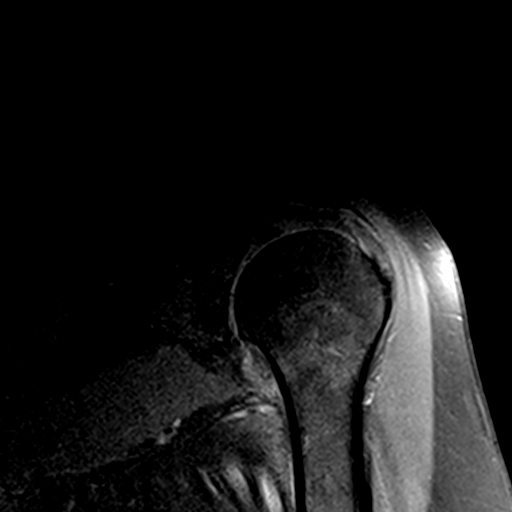
[im 16/20]
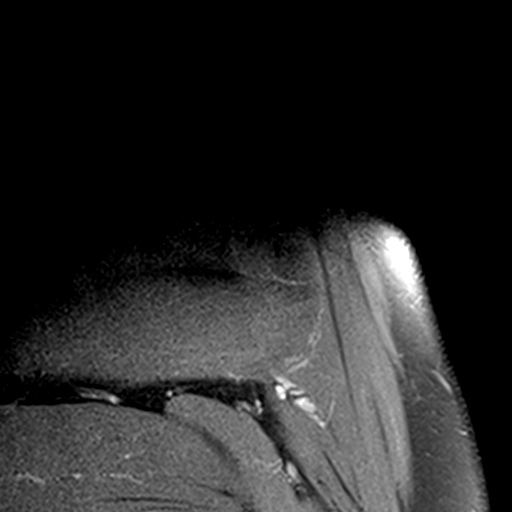

[Series 11: T2 fat-sat · oblique · 4.0mm · 0.28mm/px · 3 of 24 slices shown (2 of 2)]
[im 3/24]
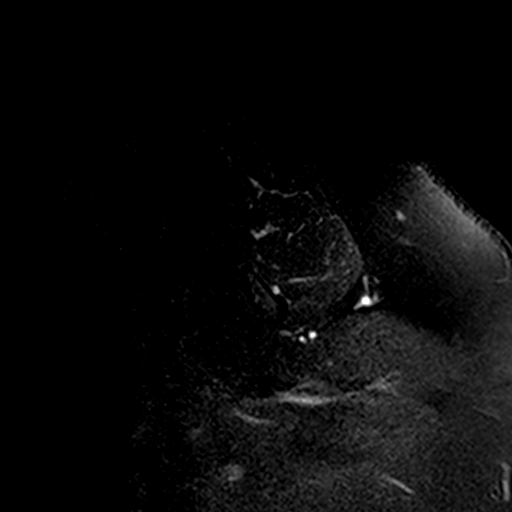
[im 12/24]
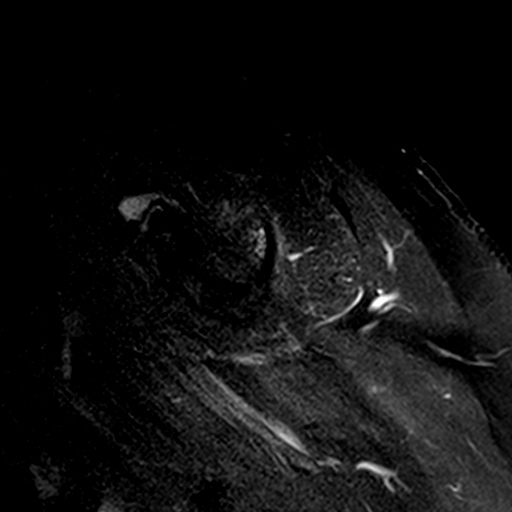
[im 21/24]
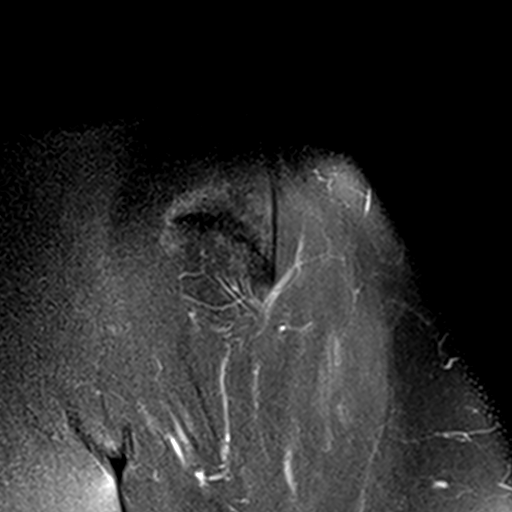

[19 of 40 positions shown; findings below may reference images not displayed]

FINDINGS: Rotator cuff: Intact. There is mild intrasubstance increased T2
signal in the rotator cuff tendons consistent with tendinopathy.

Muscles: No atrophy or abnormal signal of the muscles of the rotator
cuff.

Biceps long head:  Intact.

Acromioclavicular Joint: Moderate osteoarthritis. Type 2 acromion.
No subacromial/subdeltoid bursal fluid. Subacromial spurring noted.

Glenohumeral Joint: The patient has mild to moderate degenerative
change with joint space narrowing and mild subchondral edema in the
posterior glenoid.

Labrum:  Intact.

Bones:  No fracture, contusion or focal lesion.

Other: None.
IMPRESSION: 1. Intact rotator cuff and long head of biceps.
2. Moderate acromioclavicular osteoarthritis. Subacromial spurring
also noted.

## 2019-07-18 ENCOUNTER — Encounter (HOSPITAL_COMMUNITY): Payer: Self-pay

## 2019-07-18 ENCOUNTER — Other Ambulatory Visit: Payer: Self-pay

## 2019-07-18 ENCOUNTER — Emergency Department (HOSPITAL_COMMUNITY)
Admission: EM | Admit: 2019-07-18 | Discharge: 2019-07-18 | Disposition: A | Discharge status: Home / Self Care | Payer: Medicare HMO | Attending: Emergency Medicine | Admitting: Emergency Medicine

## 2019-07-18 ENCOUNTER — Emergency Department (HOSPITAL_COMMUNITY): Payer: Medicare HMO

## 2019-07-18 DIAGNOSIS — R519 Headache, unspecified: Secondary | ICD-10-CM | POA: Diagnosis present

## 2019-07-18 DIAGNOSIS — Z79899 Other long term (current) drug therapy: Secondary | ICD-10-CM | POA: Insufficient documentation

## 2019-07-18 DIAGNOSIS — I10 Essential (primary) hypertension: Secondary | ICD-10-CM | POA: Insufficient documentation

## 2019-07-18 DIAGNOSIS — D649 Anemia, unspecified: Secondary | ICD-10-CM | POA: Insufficient documentation

## 2019-07-18 DIAGNOSIS — Z87891 Personal history of nicotine dependence: Secondary | ICD-10-CM | POA: Diagnosis not present

## 2019-07-18 LAB — CBC WITH DIFFERENTIAL/PLATELET
Abs Immature Granulocytes: 0.01 10*3/uL (ref 0.00–0.07)
Basophils Absolute: 0 10*3/uL (ref 0.0–0.1)
Basophils Relative: 1 %
Eosinophils Absolute: 0.3 10*3/uL (ref 0.0–0.5)
Eosinophils Relative: 5 %
HCT: 36.5 % (ref 36.0–46.0)
Hemoglobin: 11.6 g/dL — ABNORMAL LOW (ref 12.0–15.0)
Immature Granulocytes: 0 %
Lymphocytes Relative: 39 %
Lymphs Abs: 2 10*3/uL (ref 0.7–4.0)
MCH: 27.2 pg (ref 26.0–34.0)
MCHC: 31.8 g/dL (ref 30.0–36.0)
MCV: 85.5 fL (ref 80.0–100.0)
Monocytes Absolute: 0.5 10*3/uL (ref 0.1–1.0)
Monocytes Relative: 9 %
Neutro Abs: 2.4 10*3/uL (ref 1.7–7.7)
Neutrophils Relative %: 46 %
Platelets: 202 10*3/uL (ref 150–400)
RBC: 4.27 MIL/uL (ref 3.87–5.11)
RDW: 16.3 % — ABNORMAL HIGH (ref 11.5–15.5)
WBC: 5.2 10*3/uL (ref 4.0–10.5)
nRBC: 0 % (ref 0.0–0.2)

## 2019-07-18 LAB — BASIC METABOLIC PANEL
Anion gap: 8 (ref 5–15)
BUN: 21 mg/dL (ref 8–23)
CO2: 24 mmol/L (ref 22–32)
Calcium: 9 mg/dL (ref 8.9–10.3)
Chloride: 104 mmol/L (ref 98–111)
Creatinine, Ser: 1.52 mg/dL — ABNORMAL HIGH (ref 0.44–1.00)
GFR calc Af Amer: 38 mL/min — ABNORMAL LOW (ref >60)
GFR calc non Af Amer: 33 mL/min — ABNORMAL LOW (ref >60)
Glucose, Bld: 120 mg/dL — ABNORMAL HIGH (ref 70–99)
Potassium: 3.8 mmol/L (ref 3.5–5.1)
Sodium: 136 mmol/L (ref 135–145)

## 2019-07-18 IMAGING — CT CT HEAD W/O CM
3 series · 14 of 47 positions shown, 16 images · non-contrast
Comparison: None.

CLINICAL DATA: Initial evaluation for acute left frontal headache.

EXAM:
CT HEAD WITHOUT CONTRAST
TECHNIQUE: Contiguous axial images were obtained from the base of the skull
through the vertex without intravenous contrast.

[Series 2: head w o · axial · 0.47mm/px · z∈[+1287,+1412]mm · 8 of 31 slices shown, 10 images]
[im 3/31  brain]
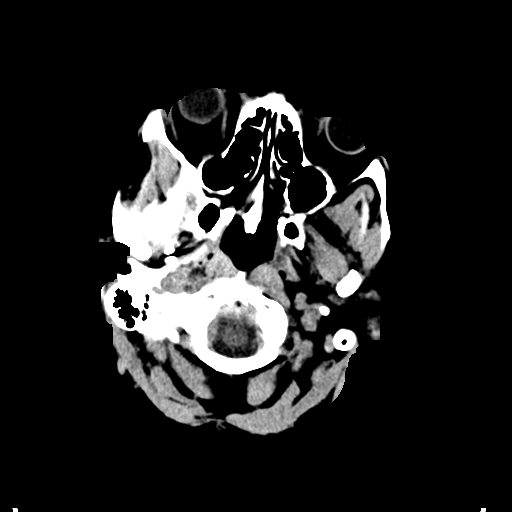
[im 3/31  bone]
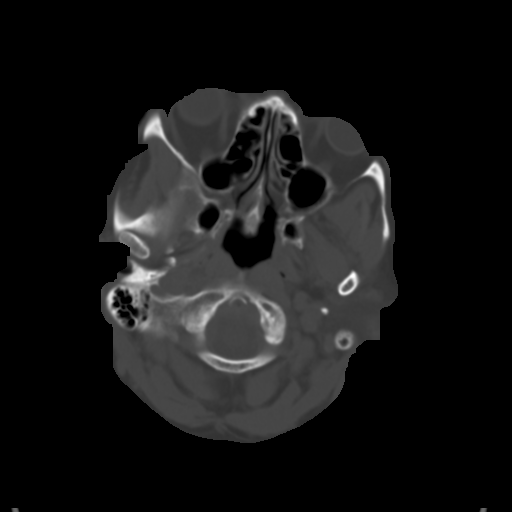
[im 7/31  brain]
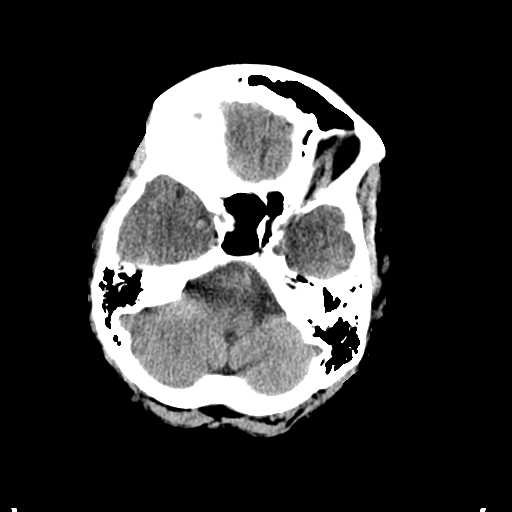
[im 10/31  brain]
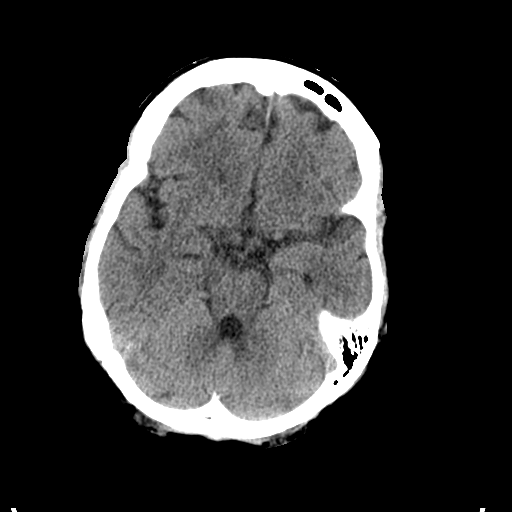
[im 14/31  brain]
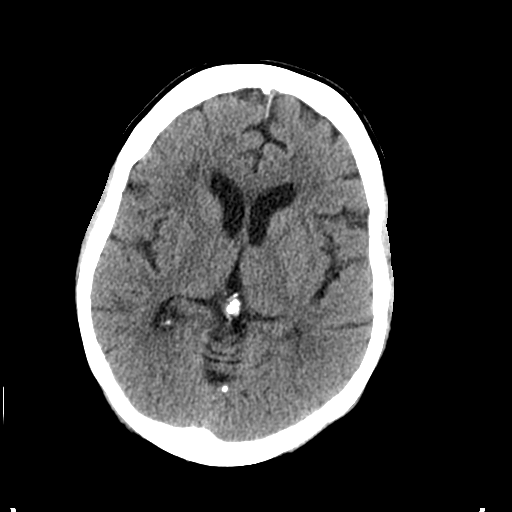
[im 17/31  brain]
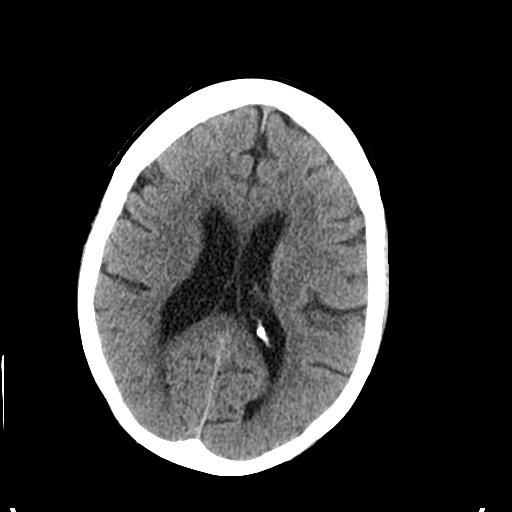
[im 17/31  bone]
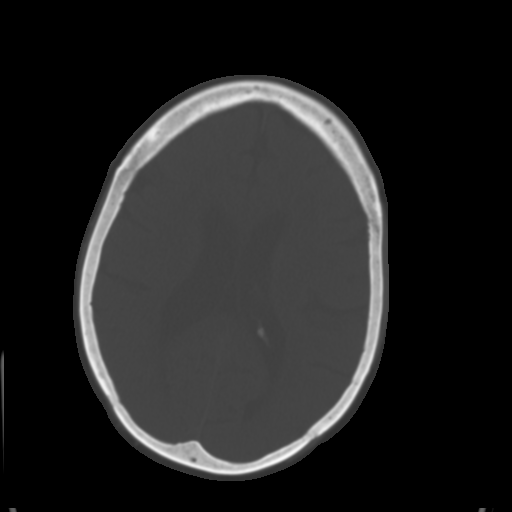
[im 21/31  brain]
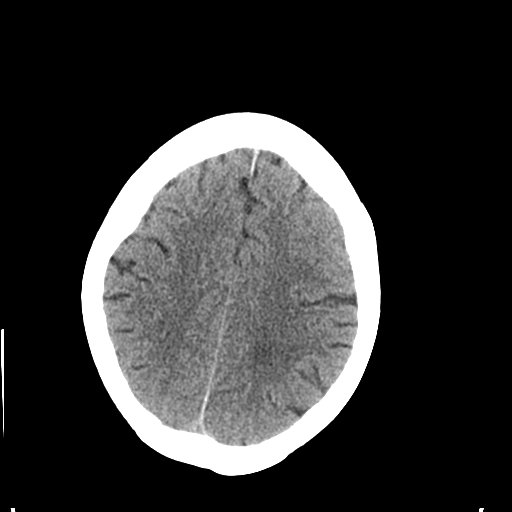
[im 24/31  brain]
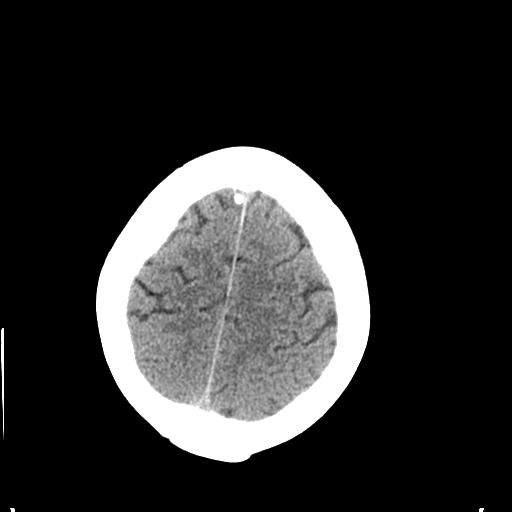
[im 28/31  brain]
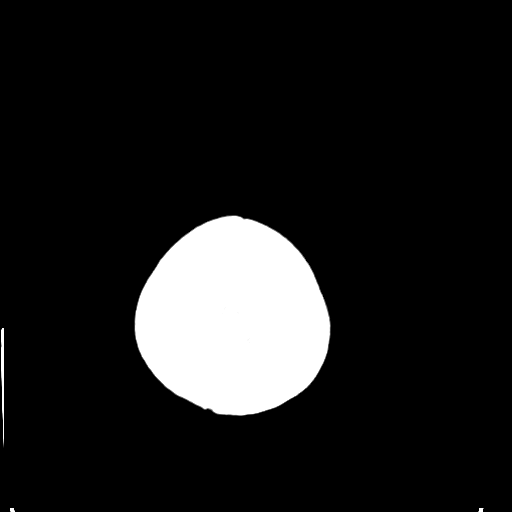

[Series 4: coronal soft · coronal · 0.31mm/px · 3 of 71 slices shown]
[im 24/71  brain]
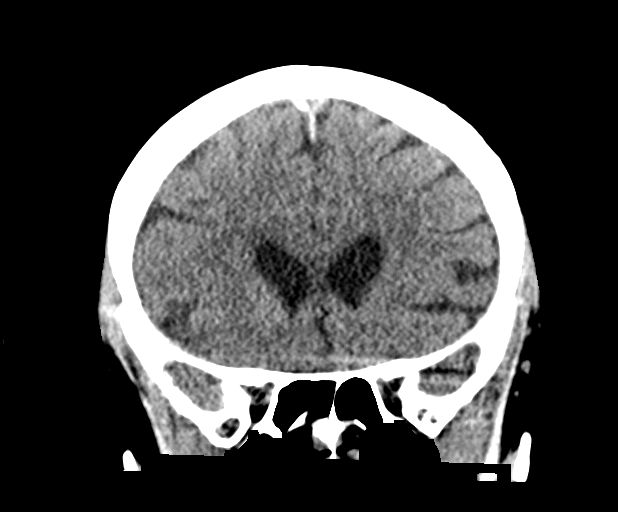
[im 32/71  brain]
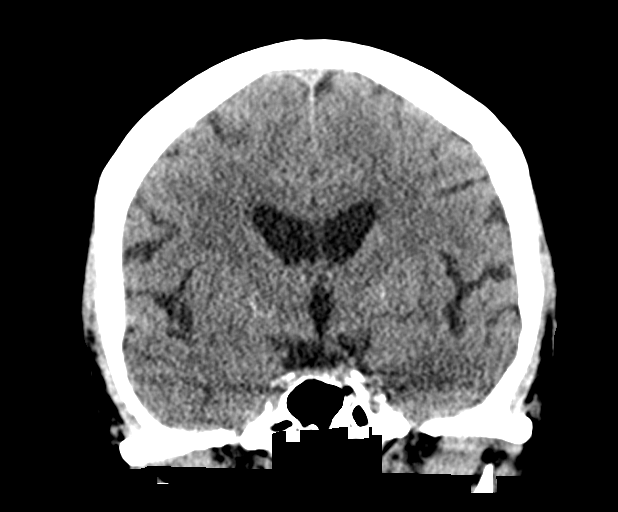
[im 39/71  brain]
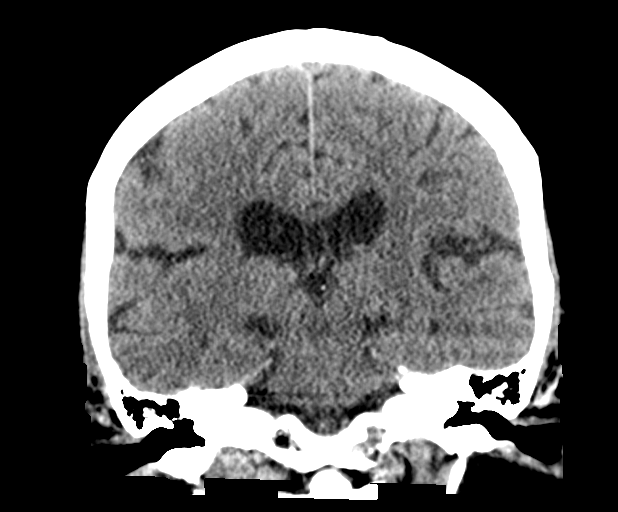

[Series 5: sagittal soft · sagittal · 0.31mm/px · 3 of 57 slices shown]
[im 19/57  brain]
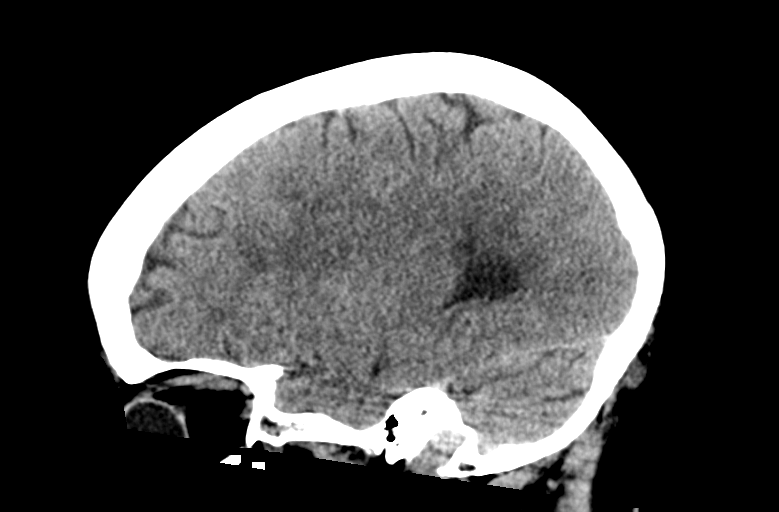
[im 29/57  brain]
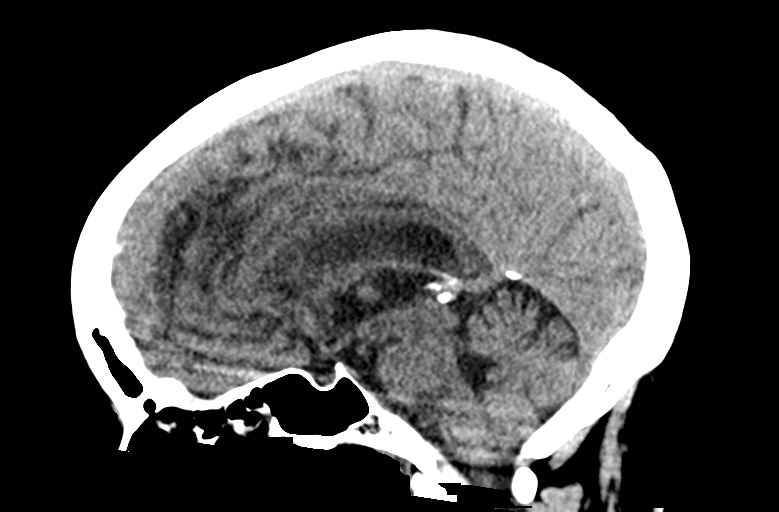
[im 38/57  brain]
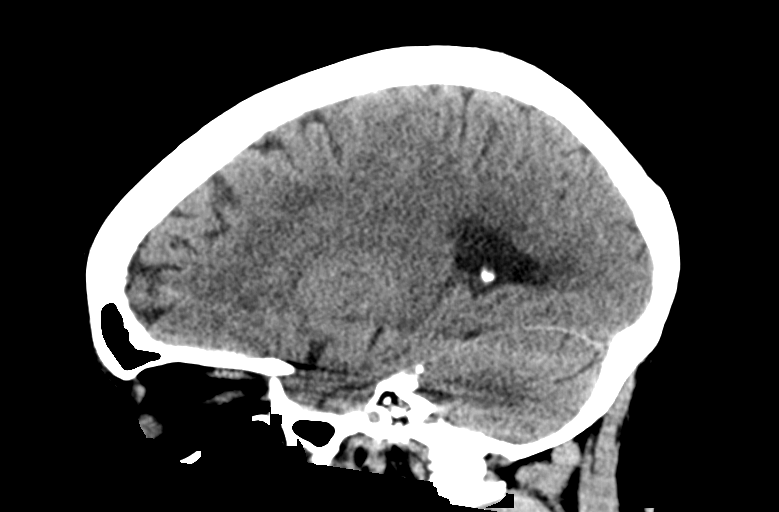

[14 of 47 positions shown; findings below may reference images not displayed]

FINDINGS: Brain: Cerebral volume within normal limits for patient age. Mild to
moderate chronic microvascular ischemic disease.

No evidence for acute intracranial hemorrhage. No findings to
suggest acute large vessel territory infarct. No mass lesion,
midline shift, or mass effect. Ventricles are normal in size without
evidence for hydrocephalus. No extra-axial fluid collection
identified.

Vascular: No hyperdense vessel identified.Scattered vascular
calcifications noted within the carotid siphons.

Skull: Scalp soft tissues demonstrate no acute abnormality.
Calvarium intact.

Sinuses/Orbits: Globes and orbital soft tissues within normal
limits.

Visualized paranasal sinuses are clear. No mastoid effusion.
IMPRESSION: 1. No acute intracranial abnormality.
2. Mild to moderate chronic microvascular ischemic disease.

## 2019-07-18 MED ORDER — SODIUM CHLORIDE 0.9 % IV BOLUS
1000.0000 mL | Freq: Once | INTRAVENOUS | Status: AC
  Administered 2019-07-18: 1000 mL via INTRAVENOUS

## 2019-07-18 MED ORDER — PROCHLORPERAZINE MALEATE 10 MG PO TABS: 10.0000 mg | ORAL_TABLET | Freq: Four times a day (QID) | ORAL | 0 refills | Status: DC | PRN

## 2019-07-18 MED ORDER — PROCHLORPERAZINE EDISYLATE 10 MG/2ML IJ SOLN
10.0000 mg | Freq: Once | INTRAMUSCULAR | Status: AC
  Administered 2019-07-18: 10 mg via INTRAVENOUS
  Filled 2019-07-18: qty 2

## 2019-07-18 MED ORDER — DIPHENHYDRAMINE HCL 50 MG/ML IJ SOLN
25.0000 mg | Freq: Once | INTRAMUSCULAR | Status: AC
  Administered 2019-07-18: 25 mg via INTRAVENOUS
  Filled 2019-07-18: qty 1

## 2019-07-18 NOTE — ED Notes (Signed)
Updated pt's daughter ?

## 2019-07-18 NOTE — ED Triage Notes (Signed)
EMS called out for "sick person". Upon arrival, EMS reports patient was complaining of frontal headache with facial pain that started yesterday. Patient has not seen PCP. Patient took Oxycodone 10 minutes prior to calling EMS. No other symptoms reported.

## 2019-07-18 NOTE — ED Provider Notes (Signed)
[facility] EMERGENCY DEPARTMENT Provider Note   CSN: [id number] Arrival date & time: 07/18/19  0250   History Chief Complaint  Patient presents with  . Headache    facial pain    Allison Watson is a 79 y.o. female.  The history is provided by the patient.  Headache She has history of hypertension and comes in complaining of right hemicranial headache which started yesterday and got worse today.  She has difficulty describing the pain but states that it is severe.  Nothing makes it better, nothing makes it worse.  She has taken acetaminophen without relief.  She specifically denies photophobia.  She denies nausea or vomiting.  She denies any visual change.  Past Medical History:  Diagnosis Date  . Arthritis    Lt arm  . Gout   . Hypertension     Patient Active Problem List   Diagnosis Date Noted  . INSOMNIA, CHRONIC 05/08/2007  . BRONCHITIS, ACUTE WITH BRONCHOSPASM 04/16/2007  . CELLULITIS/ABSCESS, TRUNK 10/31/2006  . MUSCLE SPASM 10/31/2006  . HYPERTENSION 03/13/2006  . ALLERGIC RHINITIS 03/13/2006  . Osteoarthrosis, unspecified whether generalized or localized, unspecified site 03/13/2006  . ARTHRITIS 03/13/2006    Past Surgical History:  Procedure Laterality Date  . ABDOMINAL HYSTERECTOMY    . TUBAL LIGATION       OB History   No obstetric history on file.     No family history on file.  Social History   Tobacco Use  . Smoking status: Former Research scientist (life sciences)  . Smokeless tobacco: Never Used  Substance Use Topics  . Alcohol use: No  . Drug use: No    Home Medications Prior to Admission medications   Medication Sig Start Date End Date Taking? Authorizing Provider  acetaminophen (TYLENOL) 500 MG tablet Take 500 mg by mouth every 6 (six) hours as needed for mild pain.     [provider]  allopurinol (ZYLOPRIM) 300 MG tablet Take 300 mg by mouth daily. 01/24/19   [provider]  Calcium Carbonate-Vitamin D (CALCIUM + D PO) Take 1 tablet by  mouth daily.    [provider]  dexamethasone (DECADRON) 4 MG tablet Take 1 tablet (4 mg total) by mouth 2 (two) times daily with a meal. 02/04/19   Lily Kocher, PA-C  doxycycline (VIBRAMYCIN) 100 MG capsule Take 1 capsule (100 mg total) by mouth 2 (two) times daily. 02/04/19   Lily Kocher, PA-C  HYDROcodone-acetaminophen (NORCO/VICODIN) 5-325 MG tablet Take 1 tablet by mouth every 4 (four) hours as needed. 02/04/19   Lily Kocher, PA-C  lisinopril-hydrochlorothiazide (PRINZIDE,ZESTORETIC) 20-12.5 MG per tablet Take 1 tablet by mouth daily.    [provider]  LORazepam (ATIVAN) 1 MG tablet Take 1 mg by mouth at bedtime. 01/08/19   [provider]  nabumetone (RELAFEN) 750 MG tablet Take 1 tablet by mouth 2 (two) times daily.  09/15/15   [provider]  traMADol (ULTRAM) 50 MG tablet Take 1 tablet (50 mg total) by mouth every 6 (six) hours as needed. Patient not taking: Reported on 02/04/2019 12/14/18   Fredia Sorrow, MD  verapamil (CALAN) 120 MG tablet Take 120 mg by mouth 2 (two) times daily.  08/16/15   [provider]    Allergies    Codeine and Penicillins  Review of Systems   Review of Systems  Neurological: Positive for headaches.  All other systems reviewed and are negative.   Physical Exam Updated Vital Signs BP (!) 182/79 (BP Location: Left Arm)  Pulse 66   Temp 98.2 F (36.8 C) (Oral)   Resp 20   Ht 5' (1.524 m)   Wt 74.8 kg   SpO2 97%   BMI 32.22 kg/m   Physical Exam Vitals and nursing note reviewed.   79 year old female, moaning, but in no acute distress. Vital signs are significant for elevated blood pressure. Oxygen saturation is 97%, which is normal. Head is normocephalic and atraumatic. PERRLA, EOMI. Oropharynx is clear.  There is no tenderness to palpation over the temporalis muscle or over the insertion of the paracervical muscles. Neck is nontender and supple without adenopathy or JVD. Back is nontender  and there is no CVA tenderness. Lungs are clear without rales, wheezes, or rhonchi. Chest is nontender. Heart has regular rate and rhythm without murmur. Abdomen is soft, flat, nontender without masses or hepatosplenomegaly and peristalsis is normoactive. Extremities have no cyanosis or edema, full range of motion is present. Skin is warm and dry without rash. Neurologic: Mental status is normal, cranial nerves are intact, there are no motor or sensory deficits.  ED Results / Procedures / Treatments   Labs (all labs ordered are listed, but only abnormal results are displayed) Labs Reviewed  BASIC METABOLIC PANEL - Abnormal; Notable for the following components:      Result Value   Glucose, Bld 120 (*)    Creatinine, Ser 1.52 (*)    GFR calc non Af Amer 33 (*)    GFR calc Af Amer 38 (*)    All other components within normal limits  CBC WITH DIFFERENTIAL/PLATELET - Abnormal; Notable for the following components:   Hemoglobin 11.6 (*)    RDW 16.3 (*)    All other components within normal limits    Radiology CT Head Wo Contrast  Result Date: 07/18/2019 CLINICAL DATA:  Initial evaluation for acute left frontal headache. EXAM: CT HEAD WITHOUT CONTRAST TECHNIQUE: Contiguous axial images were obtained from the base of the skull through the vertex without intravenous contrast. COMPARISON:  None. FINDINGS: Brain: Cerebral volume within normal limits for patient age. Mild to moderate chronic microvascular ischemic disease. No evidence for acute intracranial hemorrhage. No findings to suggest acute large vessel territory infarct. No mass lesion, midline shift, or mass effect. Ventricles are normal in size without evidence for hydrocephalus. No extra-axial fluid collection identified. Vascular: No hyperdense vessel identified.Scattered vascular calcifications noted within the carotid siphons. Skull: Scalp soft tissues demonstrate no acute abnormality. Calvarium intact. Sinuses/Orbits: Globes and  orbital soft tissues within normal limits. Visualized paranasal sinuses are clear. No mastoid effusion. IMPRESSION: 1. No acute intracranial abnormality. 2. Mild to moderate chronic microvascular ischemic disease. Electronically Signed   By: Jeannine Boga M.D.   On: 07/18/2019 04:06    Procedures Procedures  Medications Ordered in ED Medications  sodium chloride 0.9 % bolus 1,000 mL (1,000 mLs Intravenous New Bag/Given 07/18/19 0331)  prochlorperazine (COMPAZINE) injection 10 mg (10 mg Intravenous Given 07/18/19 0331)  diphenhydrAMINE (BENADRYL) injection 25 mg (25 mg Intravenous Given 07/18/19 0331)    ED Course  I have reviewed the triage vital signs and the nursing notes.  Pertinent labs & imaging results that were available during my care of the patient were reviewed by me and considered in my medical decision making (see chart for details).  MDM Rules/Calculators/A&P Headache of uncertain cause.  No red flags to suggest serious pathology, but will send for CT of head.  We will also give headache cocktail of normal saline, prochlorperazine, diphenhydramine.  Old records are reviewed, and she has no relevant past visits.  CT head shows no acute process.  She had excellent relief of her headache with above-noted treatment.  I suspect headache is a muscle contraction headache.  She is discharged with prescription for prochlorperazine, referred to primary care provider for follow-up.  Also, mild anemia is noted on labs.  This is new, but last hemoglobin on record was 6 years ago.  This can be followed by her PCP.  Final Clinical Impression(s) / ED Diagnoses Final diagnoses:  Bad headache  Normochromic normocytic anemia    Rx / DC Orders ED Discharge Orders         Ordered    prochlorperazine (COMPAZINE) 10 MG tablet  Every 6 hours PRN,   Status:  Discontinued     07/18/19 0447    prochlorperazine (COMPAZINE) 10 MG tablet  Every 6 hours PRN     07/18/19 2060             Delora Fuel, MD 15/61/53 [phone]

## 2019-07-18 NOTE — ED Notes (Signed)
Pts daughter at bedside  

## 2019-07-21 ENCOUNTER — Encounter (HOSPITAL_COMMUNITY): Payer: Self-pay

## 2019-07-21 ENCOUNTER — Other Ambulatory Visit: Payer: Self-pay

## 2019-07-21 ENCOUNTER — Emergency Department (HOSPITAL_COMMUNITY): Payer: Medicare HMO

## 2019-07-21 ENCOUNTER — Emergency Department (HOSPITAL_COMMUNITY)
Admission: EM | Admit: 2019-07-21 | Discharge: 2019-07-21 | Disposition: A | Discharge status: Home / Self Care | Payer: Medicare HMO | Attending: Emergency Medicine | Admitting: Emergency Medicine

## 2019-07-21 DIAGNOSIS — Z79899 Other long term (current) drug therapy: Secondary | ICD-10-CM | POA: Diagnosis not present

## 2019-07-21 DIAGNOSIS — I10 Essential (primary) hypertension: Secondary | ICD-10-CM | POA: Diagnosis not present

## 2019-07-21 DIAGNOSIS — R519 Headache, unspecified: Secondary | ICD-10-CM | POA: Diagnosis present

## 2019-07-21 DIAGNOSIS — R110 Nausea: Secondary | ICD-10-CM | POA: Insufficient documentation

## 2019-07-21 DIAGNOSIS — Z87891 Personal history of nicotine dependence: Secondary | ICD-10-CM | POA: Diagnosis not present

## 2019-07-21 LAB — COMPREHENSIVE METABOLIC PANEL
ALT: 19 U/L (ref 0–44)
AST: 19 U/L (ref 15–41)
Albumin: 3.4 g/dL — ABNORMAL LOW (ref 3.5–5.0)
Alkaline Phosphatase: 71 U/L (ref 38–126)
Anion gap: 7 (ref 5–15)
BUN: 20 mg/dL (ref 8–23)
CO2: 25 mmol/L (ref 22–32)
Calcium: 8.7 mg/dL — ABNORMAL LOW (ref 8.9–10.3)
Chloride: 107 mmol/L (ref 98–111)
Creatinine, Ser: 1.43 mg/dL — ABNORMAL HIGH (ref 0.44–1.00)
GFR calc Af Amer: 41 mL/min — ABNORMAL LOW (ref >60)
GFR calc non Af Amer: 35 mL/min — ABNORMAL LOW (ref >60)
Glucose, Bld: 148 mg/dL — ABNORMAL HIGH (ref 70–99)
Potassium: 3.7 mmol/L (ref 3.5–5.1)
Sodium: 139 mmol/L (ref 135–145)
Total Bilirubin: 0.3 mg/dL (ref 0.3–1.2)
Total Protein: 5.8 g/dL — ABNORMAL LOW (ref 6.5–8.1)

## 2019-07-21 LAB — CBC WITH DIFFERENTIAL/PLATELET
Abs Immature Granulocytes: 0.01 10*3/uL (ref 0.00–0.07)
Basophils Absolute: 0 10*3/uL (ref 0.0–0.1)
Basophils Relative: 0 %
Eosinophils Absolute: 0.3 10*3/uL (ref 0.0–0.5)
Eosinophils Relative: 5 %
HCT: 34.5 % — ABNORMAL LOW (ref 36.0–46.0)
Hemoglobin: 10.8 g/dL — ABNORMAL LOW (ref 12.0–15.0)
Immature Granulocytes: 0 %
Lymphocytes Relative: 37 %
Lymphs Abs: 2.3 10*3/uL (ref 0.7–4.0)
MCH: 27 pg (ref 26.0–34.0)
MCHC: 31.3 g/dL (ref 30.0–36.0)
MCV: 86.3 fL (ref 80.0–100.0)
Monocytes Absolute: 0.5 10*3/uL (ref 0.1–1.0)
Monocytes Relative: 8 %
Neutro Abs: 3 10*3/uL (ref 1.7–7.7)
Neutrophils Relative %: 50 %
Platelets: 187 10*3/uL (ref 150–400)
RBC: 4 MIL/uL (ref 3.87–5.11)
RDW: 16.5 % — ABNORMAL HIGH (ref 11.5–15.5)
WBC: 6.1 10*3/uL (ref 4.0–10.5)
nRBC: 0 % (ref 0.0–0.2)

## 2019-07-21 IMAGING — CT CT HEAD W/O CM
3 series · 16 of 47 positions shown, 19 images · non-contrast
Comparison: [DATE]

CLINICAL DATA: Acute headache for 2 weeks, hypertension

EXAM:
CT HEAD WITHOUT CONTRAST
TECHNIQUE: Contiguous axial images were obtained from the base of the skull
through the vertex without intravenous contrast.

[Series 2: head w o · axial · 0.54mm/px · z∈[-19,+106]mm · 10 of 31 slices shown, 13 images]
[im 3/31  brain]
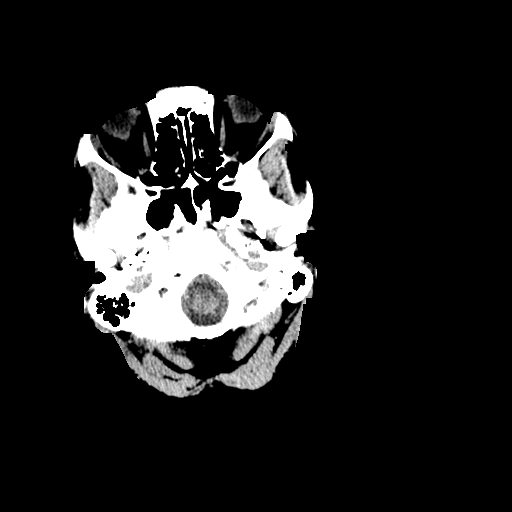
[im 3/31  bone]
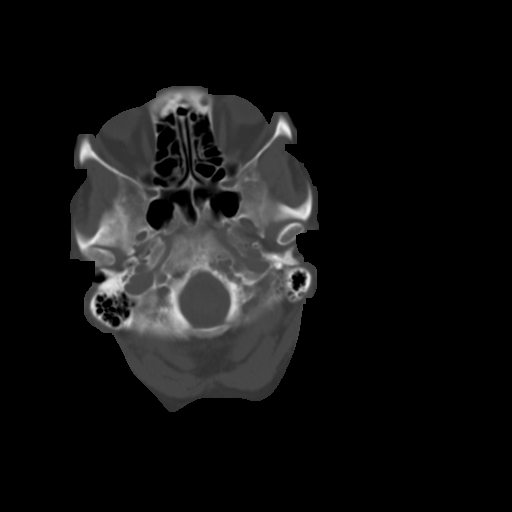
[im 6/31  brain]
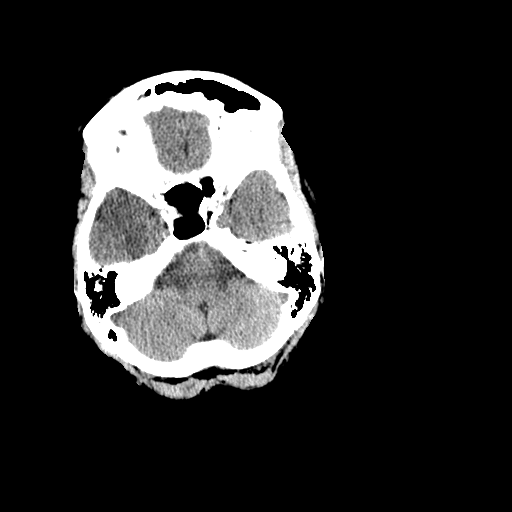
[im 9/31  brain]
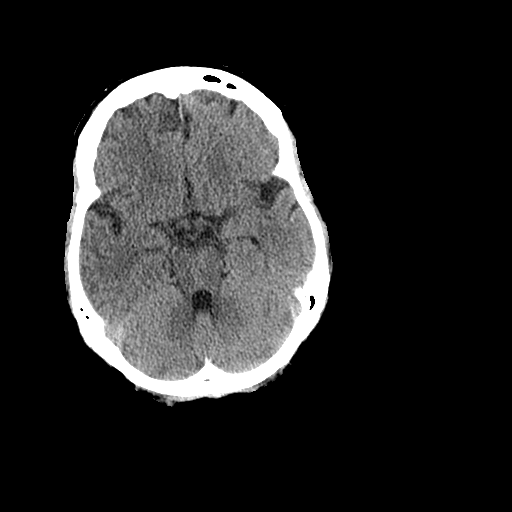
[im 11/31  brain]
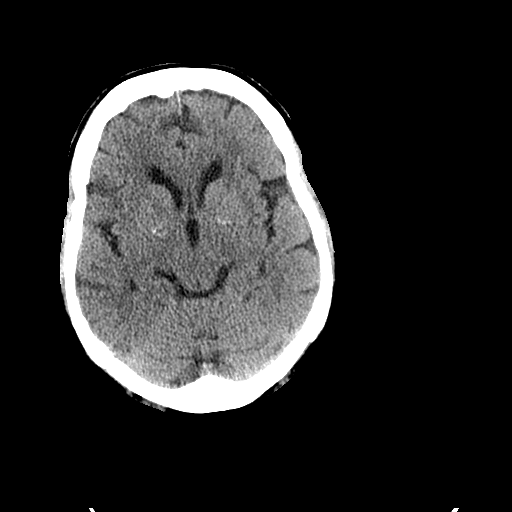
[im 14/31  brain]
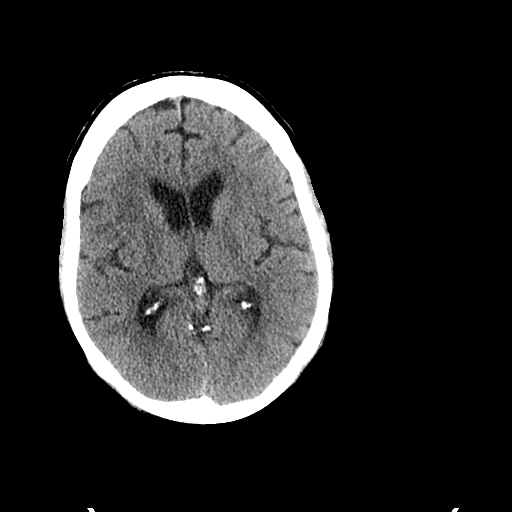
[im 14/31  bone]
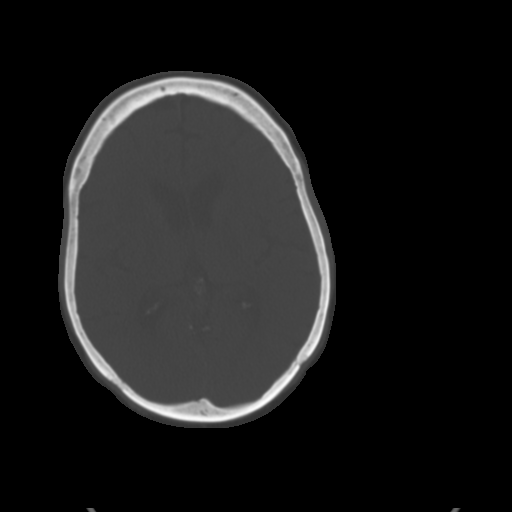
[im 17/31  brain]
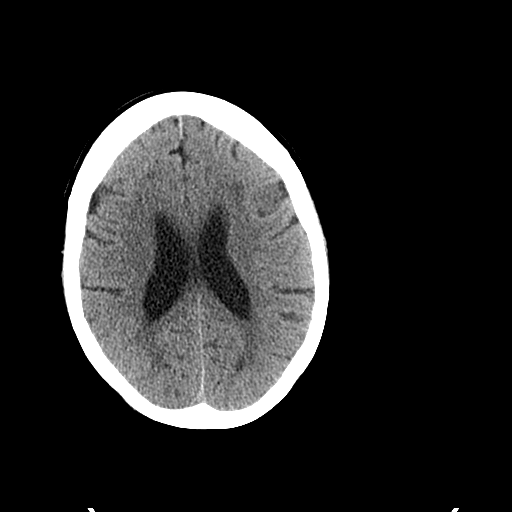
[im 20/31  brain]
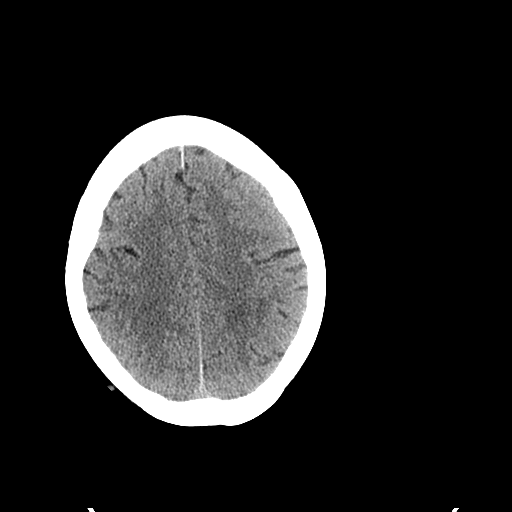
[im 23/31  brain]
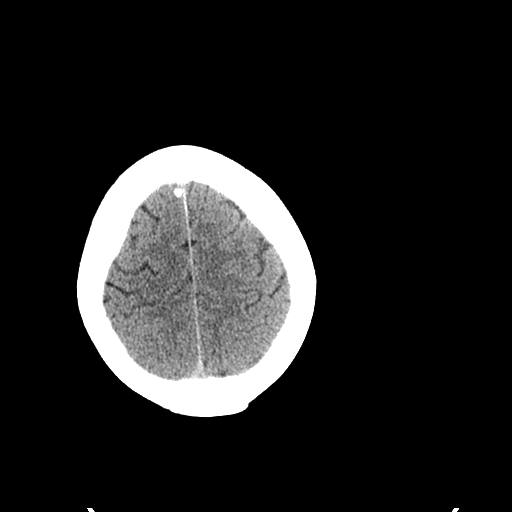
[im 25/31  brain]
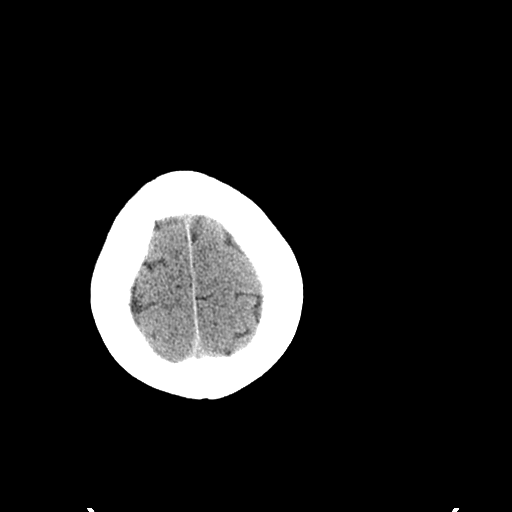
[im 25/31  bone]
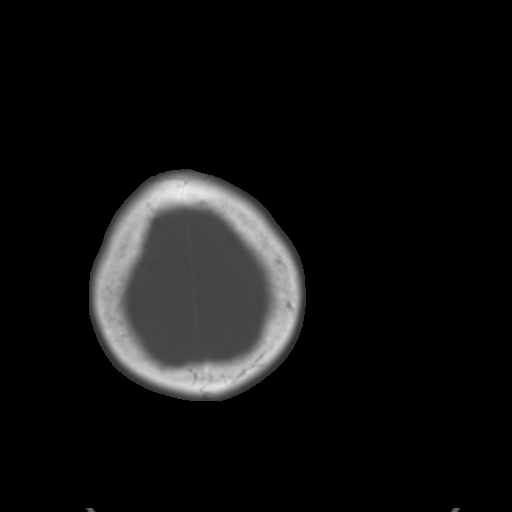
[im 28/31  brain]
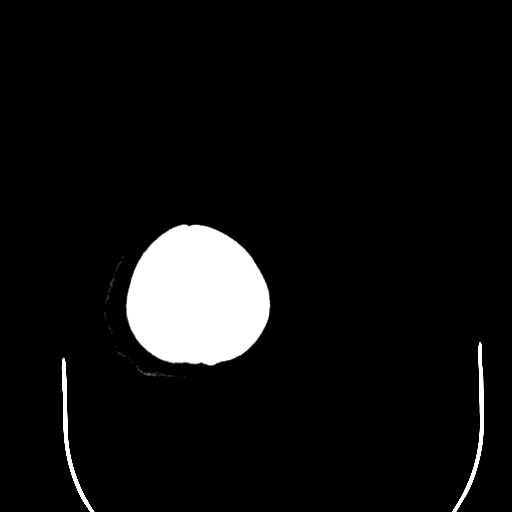

[Series 4: coronal soft · coronal · 0.36mm/px · 3 of 76 slices shown]
[im 26/76  brain]
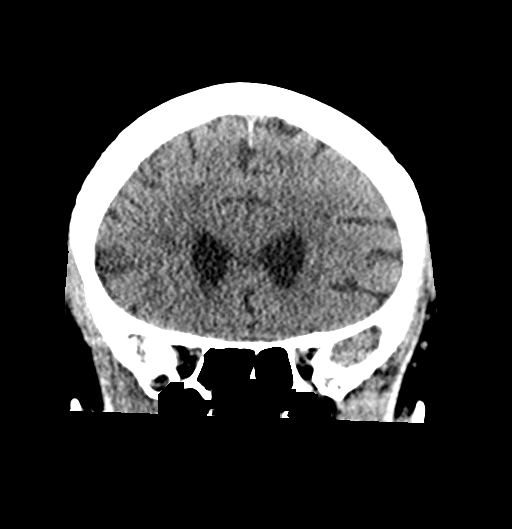
[im 34/76  brain]
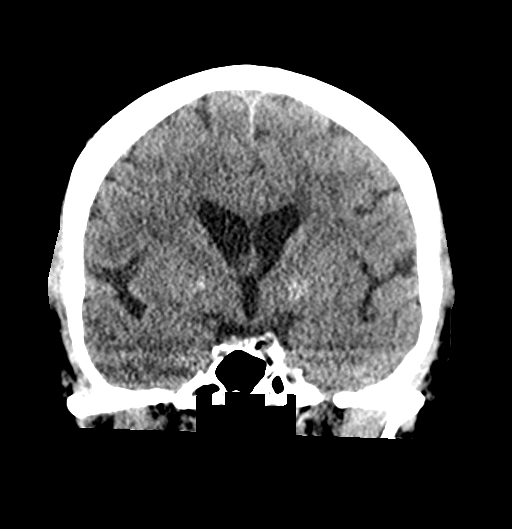
[im 42/76  brain]
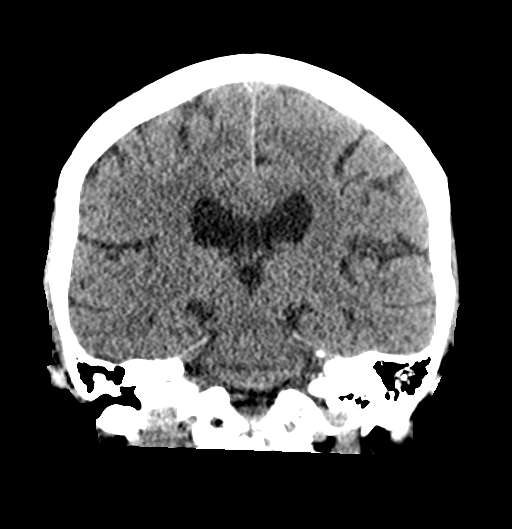

[Series 5: sagittal soft · sagittal · 0.37mm/px · 3 of 56 slices shown]
[im 19/56  brain]
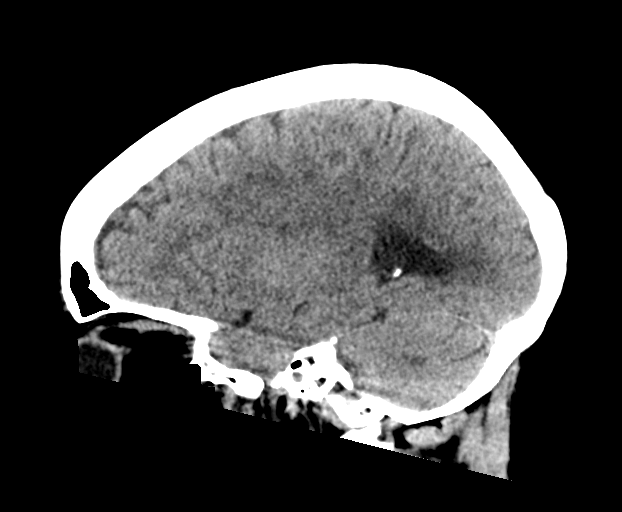
[im 28/56  brain]
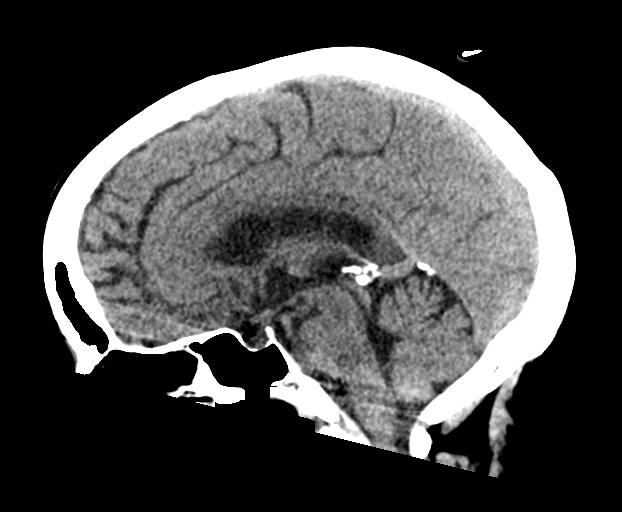
[im 37/56  brain]
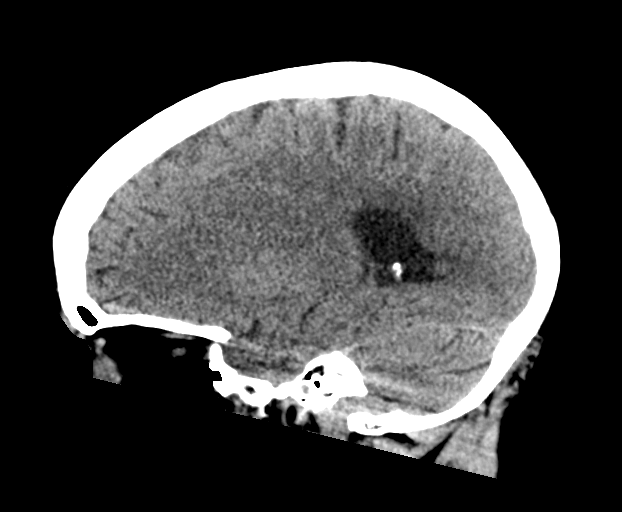

[16 of 47 positions shown; findings below may reference images not displayed]

FINDINGS: Brain: No acute infarct or hemorrhage. Scattered hypodensities in
the periventricular white matter are stable. Lateral ventricles and
remaining midline structures are unremarkable. No acute extra-axial
fluid collections. No mass effect.

Vascular: No hyperdense vessel or unexpected calcification.

Skull: Normal. Negative for fracture or focal lesion.

Sinuses/Orbits: No acute finding.

Other: None
IMPRESSION: 1. Stable chronic small vessel ischemic changes. No acute
intracranial process.

## 2019-07-21 MED ORDER — HYDROCODONE-ACETAMINOPHEN 5-325 MG PO TABS: ORAL_TABLET | ORAL | 0 refills | Status: DC

## 2019-07-21 MED ORDER — HYDROMORPHONE HCL 1 MG/ML IJ SOLN
1.0000 mg | Freq: Once | INTRAMUSCULAR | Status: AC
  Administered 2019-07-21: 1 mg via INTRAVENOUS
  Filled 2019-07-21: qty 1

## 2019-07-21 MED ORDER — METOCLOPRAMIDE HCL 5 MG/ML IJ SOLN
5.0000 mg | Freq: Once | INTRAMUSCULAR | Status: AC
  Administered 2019-07-21: 5 mg via INTRAVENOUS
  Filled 2019-07-21: qty 2

## 2019-07-21 MED ORDER — KETOROLAC TROMETHAMINE 30 MG/ML IJ SOLN
15.0000 mg | Freq: Once | INTRAMUSCULAR | Status: AC
  Administered 2019-07-21: 15 mg via INTRAVENOUS
  Filled 2019-07-21: qty 1

## 2019-07-21 MED ORDER — ONDANSETRON HCL 4 MG/2ML IJ SOLN
4.0000 mg | Freq: Once | INTRAMUSCULAR | Status: AC
  Administered 2019-07-21: 4 mg via INTRAVENOUS
  Filled 2019-07-21: qty 2

## 2019-07-21 MED ORDER — DIPHENHYDRAMINE HCL 50 MG/ML IJ SOLN
25.0000 mg | Freq: Once | INTRAMUSCULAR | Status: AC
  Administered 2019-07-21: 25 mg via INTRAVENOUS
  Filled 2019-07-21: qty 1

## 2019-07-21 NOTE — ED Triage Notes (Signed)
Pt seen on 07/18/19 for Headache and was given meds which relieved her pain. Pt reports that another headache started today and went to her PCP . He told her that her BP was high and wouldn't give her any meds

## 2019-07-21 NOTE — ED Provider Notes (Signed)
[facility] Provider Note   CSN: [id number] Arrival date & time: 07/21/19  1705     History Chief Complaint  Patient presents with  . Headache    Allison Watson is a 79 y.o. female.  Patient complains of a headache with some nausea.  Patient has a history of migraines but this is worse than normal  The history is provided by the patient. No language interpreter was used.  Headache Pain location:  Frontal Quality:  Dull Radiates to:  Does not radiate Severity currently:  7/10 Severity at highest:  8/10 Onset quality:  Sudden Timing:  Constant Chronicity:  New Similar to prior headaches: no   Context: not activity   Relieved by:  Nothing Worsened by:  Nothing Associated symptoms: no abdominal pain, no back pain, no congestion, no cough, no diarrhea, no fatigue, no seizures and no sinus pressure        Past Medical History:  Diagnosis Date  . Arthritis    Lt arm  . Gout   . Hypertension     Patient Active Problem List   Diagnosis Date Noted  . INSOMNIA, CHRONIC 05/08/2007  . BRONCHITIS, ACUTE WITH BRONCHOSPASM 04/16/2007  . CELLULITIS/ABSCESS, TRUNK 10/31/2006  . MUSCLE SPASM 10/31/2006  . HYPERTENSION 03/13/2006  . ALLERGIC RHINITIS 03/13/2006  . Osteoarthrosis, unspecified whether generalized or localized, unspecified site 03/13/2006  . ARTHRITIS 03/13/2006    Past Surgical History:  Procedure Laterality Date  . ABDOMINAL HYSTERECTOMY    . TUBAL LIGATION       OB History   No obstetric history on file.     No family history on file.  Social History   Tobacco Use  . Smoking status: Former Research scientist (life sciences)  . Smokeless tobacco: Never Used  Substance Use Topics  . Alcohol use: No  . Drug use: No    Home Medications Prior to Admission medications   Medication Sig Start Date End Date Taking? Authorizing Provider  acetaminophen (TYLENOL) 500 MG tablet Take 500 mg by mouth every 6 (six) hours as needed for mild pain.    Yes  [provider]  allopurinol (ZYLOPRIM) 300 MG tablet Take 300 mg by mouth daily. 01/24/19  Yes [provider]  cholecalciferol (VITAMIN D3) 25 MCG (1000 UNIT) tablet Take 1,000 Units by mouth daily.   Yes [provider]  ibuprofen (ADVIL) 200 MG tablet Take 200 mg by mouth daily.   Yes [provider]  labetalol (NORMODYNE) 100 MG tablet Take 100 mg by mouth 2 (two) times daily. 07/21/19  Yes [provider]  lisinopril-hydrochlorothiazide (PRINZIDE,ZESTORETIC) 20-12.5 MG per tablet Take 1 tablet by mouth daily.   Yes [provider]  LORazepam (ATIVAN) 1 MG tablet Take 1 mg by mouth at bedtime. 01/08/19  Yes [provider]  oxyCODONE (OXY IR/ROXICODONE) 5 MG immediate release tablet Take 5 mg by mouth 4 (four) times daily as needed for moderate pain or severe pain.  05/27/19  Yes [provider]  prochlorperazine (COMPAZINE) 10 MG tablet Take 1 tablet (10 mg total) by mouth every 6 (six) hours as needed for nausea or vomiting (or headache). 10/31/60  Yes Delora Fuel, MD  verapamil (CALAN) 120 MG tablet Take 120 mg by mouth 2 (two) times daily.  08/16/15  Yes [provider]  HYDROcodone-acetaminophen (NORCO/VICODIN) 5-325 MG tablet Take 1 every 8 hours if needed for bad headache that is not relieved by Tylenol or Motrin 07/21/19   Milton Ferguson, MD  Allergies    Codeine and Penicillins  Review of Systems   Review of Systems  Constitutional: Negative for appetite change and fatigue.  HENT: Negative for congestion, ear discharge and sinus pressure.   Eyes: Negative for discharge.  Respiratory: Negative for cough.   Cardiovascular: Negative for chest pain.  Gastrointestinal: Negative for abdominal pain and diarrhea.  Genitourinary: Negative for frequency and hematuria.  Musculoskeletal: Negative for back pain.  Skin: Negative for rash.  Neurological: Positive for headaches. Negative for seizures.    Psychiatric/Behavioral: Negative for hallucinations.    Physical Exam Updated Vital Signs BP 135/63   Pulse 62   Temp 98.5 F (36.9 C) (Oral)   Resp 19   Ht 5' (1.524 m)   Wt 70.8 kg   SpO2 95%   BMI 30.47 kg/m   Physical Exam Vitals reviewed.  Constitutional:      Appearance: She is well-developed.  HENT:     Head: Normocephalic.     Nose: Nose normal.  Eyes:     General: No scleral icterus.    Conjunctiva/sclera: Conjunctivae normal.  Neck:     Thyroid: No thyromegaly.  Cardiovascular:     Rate and Rhythm: Normal rate and regular rhythm.     Heart sounds: No murmur. No friction rub. No gallop.   Pulmonary:     Breath sounds: No stridor. No wheezing or rales.  Chest:     Chest wall: No tenderness.  Abdominal:     General: There is no distension.     Tenderness: There is no abdominal tenderness. There is no rebound.  Musculoskeletal:        General: Normal range of motion.     Cervical back: Neck supple.  Lymphadenopathy:     Cervical: No cervical adenopathy.  Skin:    Findings: No erythema or rash.  Neurological:     Mental Status: She is alert and oriented to person, place, and time.     Motor: No abnormal muscle tone.     Coordination: Coordination normal.  Psychiatric:        Behavior: Behavior normal.     ED Results / Procedures / Treatments   Labs (all labs ordered are listed, but only abnormal results are displayed) Labs Reviewed  CBC WITH DIFFERENTIAL/PLATELET - Abnormal; Notable for the following components:      Result Value   Hemoglobin 10.8 (*)    HCT 34.5 (*)    RDW 16.5 (*)    All other components within normal limits  COMPREHENSIVE METABOLIC PANEL - Abnormal; Notable for the following components:   Glucose, Bld 148 (*)    Creatinine, Ser 1.43 (*)    Calcium 8.7 (*)    Total Protein 5.8 (*)    Albumin 3.4 (*)    GFR calc non Af Amer 35 (*)    GFR calc Af Amer 41 (*)    All other components within normal limits     EKG None  Radiology CT Head Wo Contrast  Result Date: 07/21/2019 CLINICAL DATA:  Acute headache for 2 weeks, hypertension EXAM: CT HEAD WITHOUT CONTRAST TECHNIQUE: Contiguous axial images were obtained from the base of the skull through the vertex without intravenous contrast. COMPARISON:  07/18/2019 FINDINGS: Brain: No acute infarct or hemorrhage. Scattered hypodensities in the periventricular white matter are stable. Lateral ventricles and remaining midline structures are unremarkable. No acute extra-axial fluid collections. No mass effect. Vascular: No hyperdense vessel or unexpected calcification. Skull: Normal. Negative for fracture or focal lesion.  Sinuses/Orbits: No acute finding. Other: None IMPRESSION: 1. Stable chronic small vessel ischemic changes. No acute intracranial process. Electronically Signed   By: Randa Ngo M.D.   On: 07/21/2019 19:43    Procedures Procedures (including critical care time)  Medications Ordered in ED Medications  HYDROmorphone (DILAUDID) injection 1 mg (1 mg Intravenous Given 07/21/19 1856)  ondansetron (ZOFRAN) injection 4 mg (4 mg Intravenous Given 07/21/19 1856)  ketorolac (TORADOL) 30 MG/ML injection 15 mg (15 mg Intravenous Given 07/21/19 2116)  diphenhydrAMINE (BENADRYL) injection 25 mg (25 mg Intravenous Given 07/21/19 2116)  metoCLOPramide (REGLAN) injection 5 mg (5 mg Intravenous Given 07/21/19 2116)    ED Course  I have reviewed the triage vital signs and the nursing notes.  Pertinent labs & imaging results that were available during my care of the patient were reviewed by me and considered in my medical decision making (see chart for details).    MDM Rules/Calculators/A&P                      CT head negative.  Labs show mild anemia.  Patient improved with Toradol Reglan and Benadryl.  She will follow-up with neurology for her migraine Final Clinical Impression(s) / ED Diagnoses Final diagnoses:  Bad headache    Rx / DC  Orders ED Discharge Orders         Ordered    HYDROcodone-acetaminophen (NORCO/VICODIN) 5-325 MG tablet     07/21/19 2145           Milton Ferguson, MD 07/21/19 2152

## 2019-07-21 NOTE — ED Notes (Signed)
edp notified of pt still being in pain

## 2019-07-21 NOTE — ED Notes (Signed)
Pt to ct 

## 2019-07-21 NOTE — ED Notes (Signed)
Lab in room.

## 2019-07-21 NOTE — Discharge Instructions (Addendum)
Follow up with dr. Merlene Laughter or another neurologist for your headaches.  Call tomorrow or the next day to make an appointment.  Let them know you are in the emergency department.

## 2020-10-05 ENCOUNTER — Inpatient Hospital Stay (HOSPITAL_COMMUNITY)
Admission: EM | Admit: 2020-10-05 | Discharge: 2020-10-09 | DRG: 071 | Disposition: A | Discharge status: Home / Self Care | Payer: Medicare Other | Attending: Family Medicine | Admitting: Family Medicine

## 2020-10-05 ENCOUNTER — Emergency Department (HOSPITAL_COMMUNITY): Payer: Medicare Other

## 2020-10-05 ENCOUNTER — Encounter (HOSPITAL_COMMUNITY): Payer: Self-pay | Admitting: Emergency Medicine

## 2020-10-05 ENCOUNTER — Emergency Department (HOSPITAL_COMMUNITY)
Admission: EM | Admit: 2020-10-05 | Discharge: 2020-10-05 | Discharge status: Against Medical Advice | Payer: Medicare Other | Source: Home / Self Care | Attending: Emergency Medicine | Admitting: Emergency Medicine

## 2020-10-05 ENCOUNTER — Other Ambulatory Visit: Payer: Self-pay

## 2020-10-05 ENCOUNTER — Encounter (HOSPITAL_COMMUNITY): Payer: Self-pay

## 2020-10-05 DIAGNOSIS — Z90710 Acquired absence of both cervix and uterus: Secondary | ICD-10-CM

## 2020-10-05 DIAGNOSIS — Z880 Allergy status to penicillin: Secondary | ICD-10-CM | POA: Diagnosis not present

## 2020-10-05 DIAGNOSIS — G9341 Metabolic encephalopathy: Secondary | ICD-10-CM | POA: Diagnosis not present

## 2020-10-05 DIAGNOSIS — M109 Gout, unspecified: Secondary | ICD-10-CM | POA: Diagnosis not present

## 2020-10-05 DIAGNOSIS — M25519 Pain in unspecified shoulder: Secondary | ICD-10-CM | POA: Diagnosis present

## 2020-10-05 DIAGNOSIS — N179 Acute kidney failure, unspecified: Secondary | ICD-10-CM | POA: Diagnosis not present

## 2020-10-05 DIAGNOSIS — R519 Headache, unspecified: Secondary | ICD-10-CM | POA: Insufficient documentation

## 2020-10-05 DIAGNOSIS — R443 Hallucinations, unspecified: Secondary | ICD-10-CM | POA: Diagnosis present

## 2020-10-05 DIAGNOSIS — Z885 Allergy status to narcotic agent status: Secondary | ICD-10-CM | POA: Diagnosis not present

## 2020-10-05 DIAGNOSIS — Z8249 Family history of ischemic heart disease and other diseases of the circulatory system: Secondary | ICD-10-CM

## 2020-10-05 DIAGNOSIS — E441 Mild protein-calorie malnutrition: Secondary | ICD-10-CM | POA: Diagnosis present

## 2020-10-05 DIAGNOSIS — I10 Essential (primary) hypertension: Secondary | ICD-10-CM | POA: Diagnosis present

## 2020-10-05 DIAGNOSIS — I129 Hypertensive chronic kidney disease with stage 1 through stage 4 chronic kidney disease, or unspecified chronic kidney disease: Secondary | ICD-10-CM | POA: Diagnosis not present

## 2020-10-05 DIAGNOSIS — M1990 Unspecified osteoarthritis, unspecified site: Secondary | ICD-10-CM | POA: Diagnosis present

## 2020-10-05 DIAGNOSIS — F29 Unspecified psychosis not due to a substance or known physiological condition: Secondary | ICD-10-CM | POA: Diagnosis present

## 2020-10-05 DIAGNOSIS — G8929 Other chronic pain: Secondary | ICD-10-CM | POA: Diagnosis present

## 2020-10-05 DIAGNOSIS — R410 Disorientation, unspecified: Secondary | ICD-10-CM

## 2020-10-05 DIAGNOSIS — G4700 Insomnia, unspecified: Secondary | ICD-10-CM | POA: Diagnosis not present

## 2020-10-05 DIAGNOSIS — Z20822 Contact with and (suspected) exposure to covid-19: Secondary | ICD-10-CM | POA: Diagnosis not present

## 2020-10-05 DIAGNOSIS — Z87891 Personal history of nicotine dependence: Secondary | ICD-10-CM | POA: Diagnosis not present

## 2020-10-05 DIAGNOSIS — Z6831 Body mass index (BMI) 31.0-31.9, adult: Secondary | ICD-10-CM | POA: Diagnosis not present

## 2020-10-05 DIAGNOSIS — Z79899 Other long term (current) drug therapy: Secondary | ICD-10-CM

## 2020-10-05 DIAGNOSIS — N184 Chronic kidney disease, stage 4 (severe): Secondary | ICD-10-CM | POA: Diagnosis not present

## 2020-10-05 DIAGNOSIS — I6783 Posterior reversible encephalopathy syndrome: Secondary | ICD-10-CM | POA: Diagnosis not present

## 2020-10-05 LAB — CBC WITH DIFFERENTIAL/PLATELET
Abs Immature Granulocytes: 0.01 10*3/uL (ref 0.00–0.07)
Basophils Absolute: 0 10*3/uL (ref 0.0–0.1)
Basophils Relative: 1 %
Eosinophils Absolute: 0.3 10*3/uL (ref 0.0–0.5)
Eosinophils Relative: 5 %
HCT: 38.3 % (ref 36.0–46.0)
Hemoglobin: 12.3 g/dL (ref 12.0–15.0)
Immature Granulocytes: 0 %
Lymphocytes Relative: 26 %
Lymphs Abs: 1.5 10*3/uL (ref 0.7–4.0)
MCH: 27.6 pg (ref 26.0–34.0)
MCHC: 32.1 g/dL (ref 30.0–36.0)
MCV: 85.9 fL (ref 80.0–100.0)
Monocytes Absolute: 0.6 10*3/uL (ref 0.1–1.0)
Monocytes Relative: 11 %
Neutro Abs: 3.4 10*3/uL (ref 1.7–7.7)
Neutrophils Relative %: 57 %
Platelets: 206 10*3/uL (ref 150–400)
RBC: 4.46 MIL/uL (ref 3.87–5.11)
RDW: 16.5 % — ABNORMAL HIGH (ref 11.5–15.5)
WBC: 5.8 10*3/uL (ref 4.0–10.5)
nRBC: 0 % (ref 0.0–0.2)

## 2020-10-05 LAB — BASIC METABOLIC PANEL
Anion gap: 10 (ref 5–15)
BUN: 24 mg/dL — ABNORMAL HIGH (ref 8–23)
CO2: 20 mmol/L — ABNORMAL LOW (ref 22–32)
Calcium: 9.4 mg/dL (ref 8.9–10.3)
Chloride: 109 mmol/L (ref 98–111)
Creatinine, Ser: 2 mg/dL — ABNORMAL HIGH (ref 0.44–1.00)
GFR, Estimated: 25 mL/min — ABNORMAL LOW (ref >60)
Glucose, Bld: 101 mg/dL — ABNORMAL HIGH (ref 70–99)
Potassium: 4.1 mmol/L (ref 3.5–5.1)
Sodium: 139 mmol/L (ref 135–145)

## 2020-10-05 LAB — RESP PANEL BY RT-PCR (FLU A&B, COVID) ARPGX2
Influenza A by PCR: NEGATIVE
Influenza B by PCR: NEGATIVE
SARS Coronavirus 2 by RT PCR: NEGATIVE

## 2020-10-05 IMAGING — MR MR HEAD WO/W CM
12 of 14 series · 36 of 48 positions shown · IV contrast (gadavist)
Comparison: CT head [DATE].

CLINICAL DATA: Psychosis.  Vision changes.

EXAM:
MRI HEAD WITHOUT AND WITH CONTRAST
TECHNIQUE: Multiplanar, multiecho pulse sequences of the brain and surrounding
structures were obtained without and with intravenous contrast.
CONTRAST:  7mL GADAVIST GADOBUTROL 1 MMOL/ML IV SOLN midline shift.

[Series 5: DWI · axial · 4.0mm · 0.88mm/px · z∈[-172,-37]mm · 3 of 36 slices shown (1 of 6)]
[im 1/36]
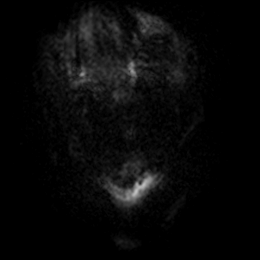
[im 18/36]
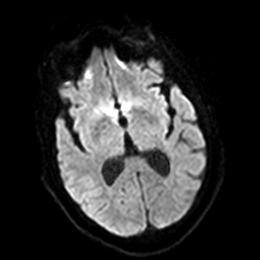
[im 36/36]
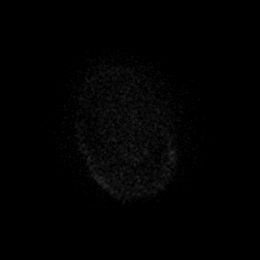

[Series 5: DWI · axial · 4.0mm · 0.88mm/px · z∈[-172,-37]mm · 4 of 36 slices shown (2 of 6)]
[im 1/36]
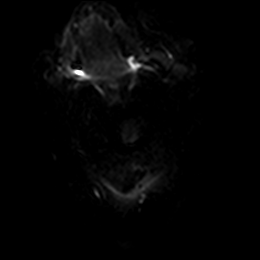
[im 12/36]
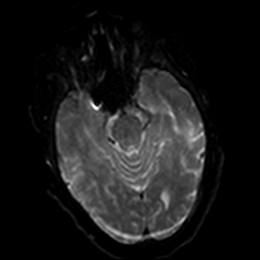
[im 24/36]
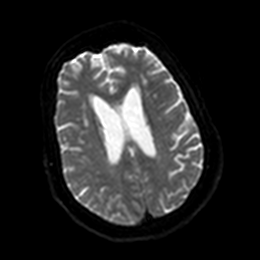
[im 36/36]
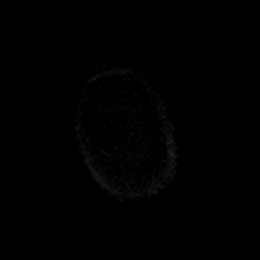

[Series 6: DWI · axial · 4.0mm · 0.88mm/px · z∈[-172,-37]mm · 4 of 36 slices shown (3 of 6)]
[im 1/36]
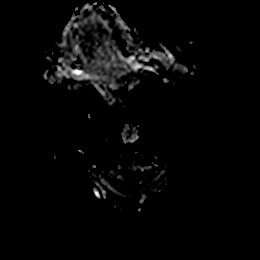
[im 12/36]
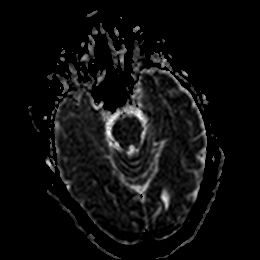
[im 24/36]
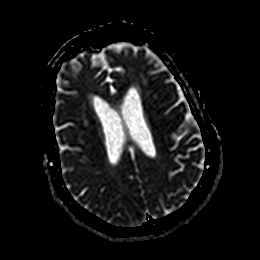
[im 36/36]
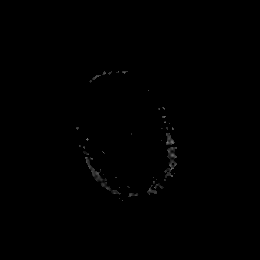

[Series 7: DWI · coronal · 5.0mm · 0.88mm/px · 3 of 28 slices shown (4 of 6)]
[im 1/28]
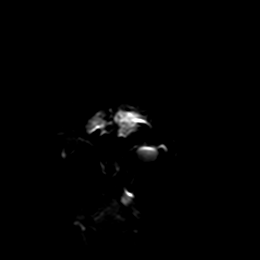
[im 14/28]
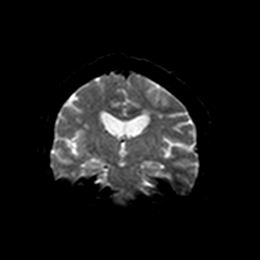
[im 28/28]
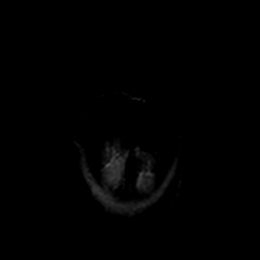

[Series 7: DWI · coronal · 5.0mm · 0.88mm/px · 3 of 28 slices shown (5 of 6)]
[im 1/28]
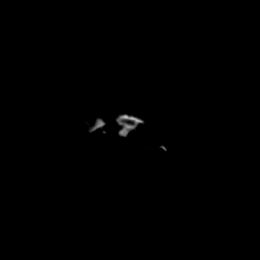
[im 14/28]
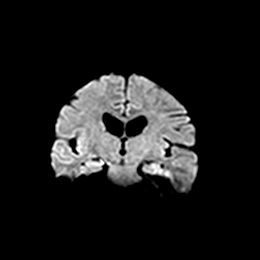
[im 28/28]
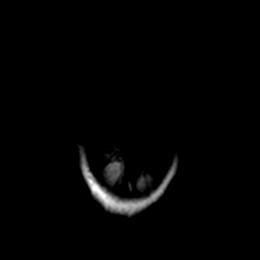

[Series 8: DWI · coronal · 5.0mm · 0.88mm/px · 3 of 28 slices shown (6 of 6)]
[im 1/28]
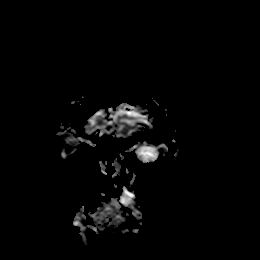
[im 14/28]
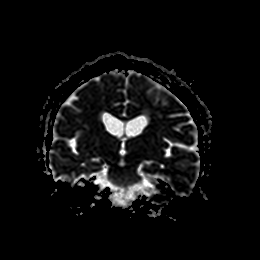
[im 28/28]
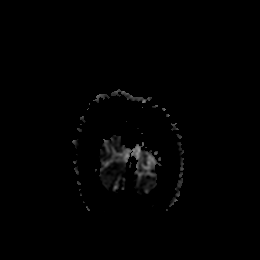

[Series 9: T1 · sagittal · 5.0mm · 0.94mm/px · 2 of 21 slices shown]
[im 1/21]
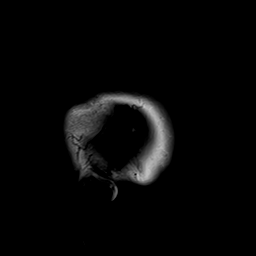
[im 21/21]
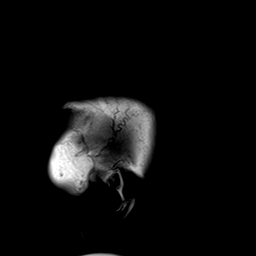

[Series 10: T2 · axial · 5.0mm · 0.72mm/px · z∈[-168,-41]mm · 2 of 20 slices shown (1 of 2)]
[im 1/20]
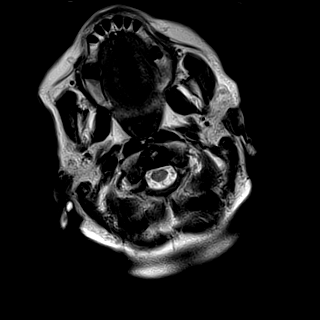
[im 20/20]
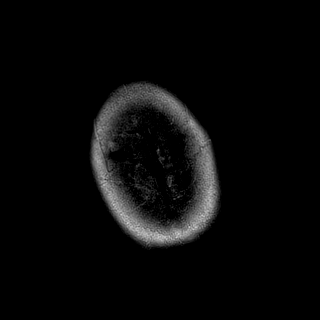

[Series 11: ax hemo · axial · 5.0mm · 0.86mm/px · z∈[-172,-34]mm · 3 of 25 slices shown]
[im 1/25]
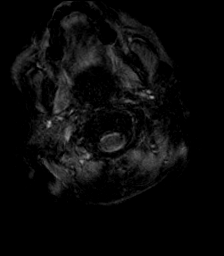
[im 13/25]
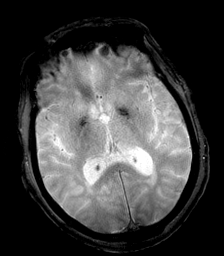
[im 25/25]
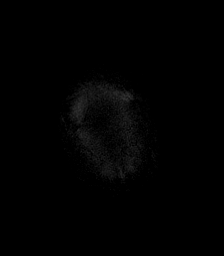

[Series 12: FLAIR · axial · 4.0mm · 0.43mm/px · z∈[-166,-39]mm · 3 of 34 slices shown]
[im 1/34]
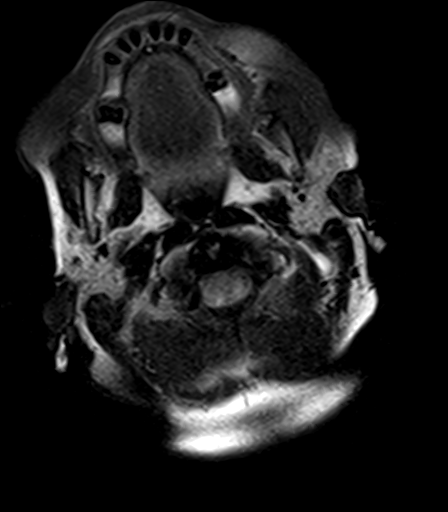
[im 17/34]
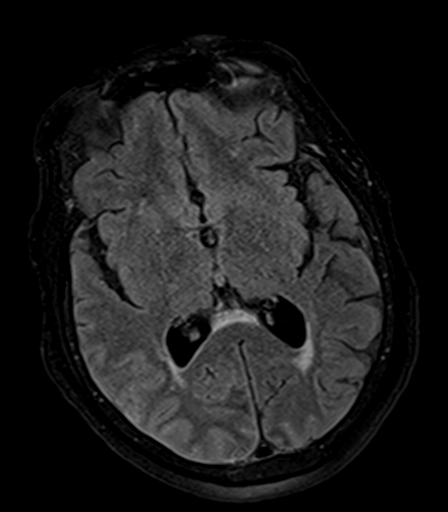
[im 34/34]
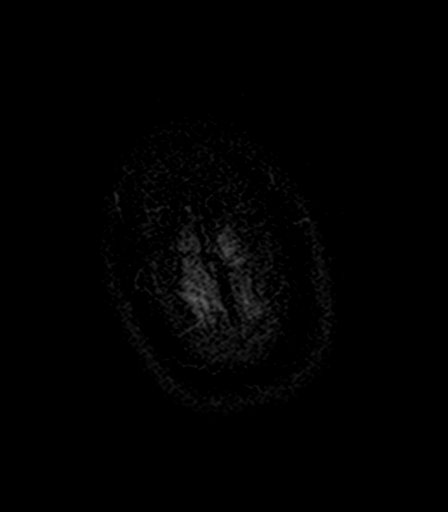

[Series 14: T2 · coronal · 5.0mm · 0.72mm/px · 3 of 26 slices shown (2 of 2)]
[im 1/26]
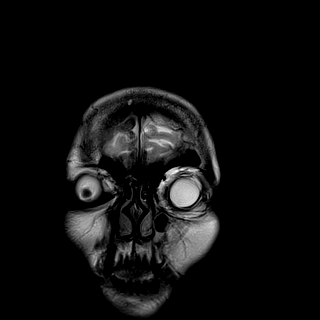
[im 13/26]
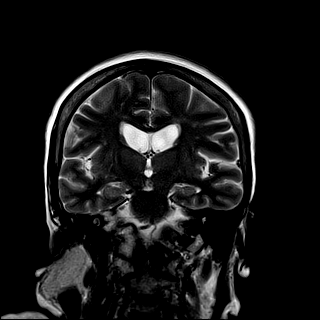
[im 26/26]
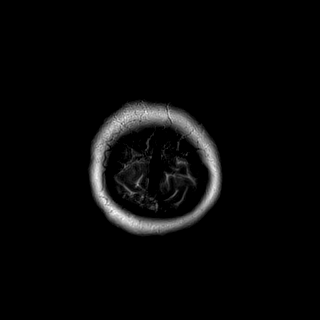

[Series 20: T1 post-contrast · coronal · 5.0mm · 0.34mm/px · 3 of 29 slices shown]
[im 1/29]
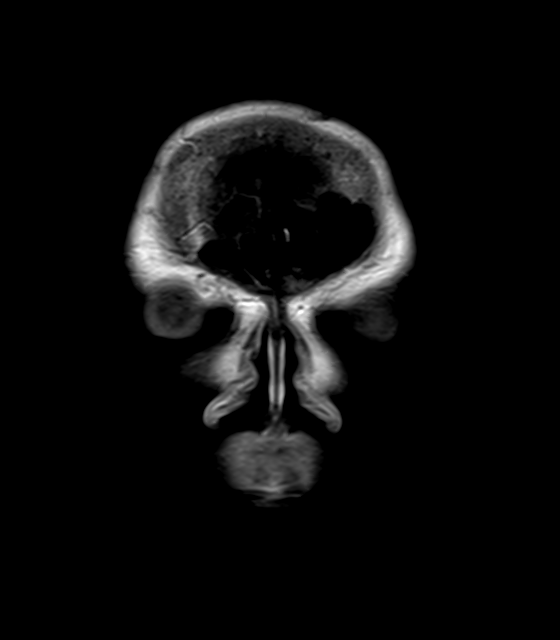
[im 15/29]
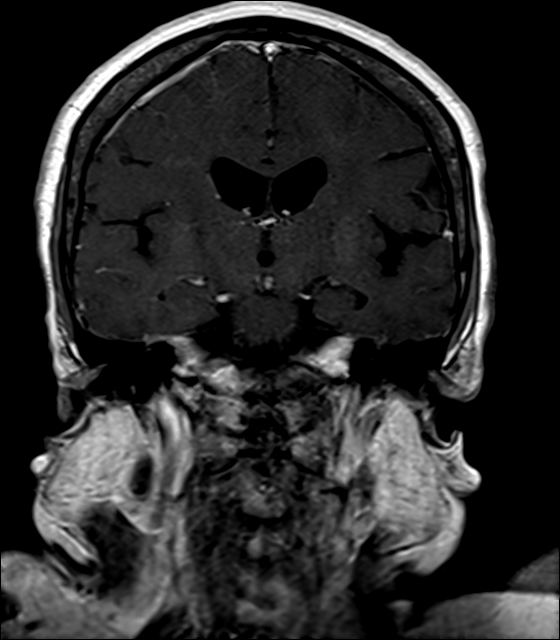
[im 29/29]
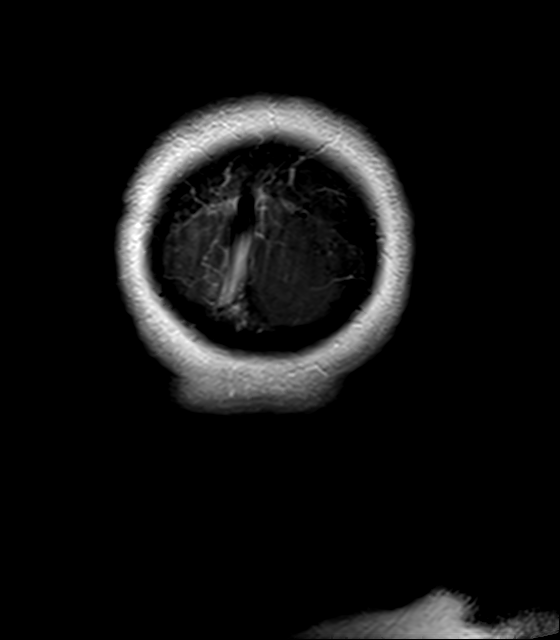

[36 of 48 positions shown; findings below may reference images not displayed]

FINDINGS: Brain: Abnormal sulcal FLAIR hyperintensity and curvilinear pial
enhancement involving the posterior right cerebrum, including the
parietal lobe, occipital lobe and posterior right temporal lobe. No
restricted diffusion to suggest acute infarct. No subjacent
encephalomalacia or associated susceptibility artifact. Mild atrophy
with ex vacuo ventricular dilation. No hydrocephalus. Moderate
scattered T2/FLAIR hyperintensities within the white matter, which
are nonspecific but most likely related to chronic microvascular
ischemic disease. No sizable extra-axial fluid collection. No mass
lesion or midline shift.

Vascular: Major arterial flow voids are maintained skull base.

Skull and upper cervical spine: Normal marrow signal.

Sinuses/Orbits: Left maxillary sinus retention cyst. Unremarkable
orbits.

Other: No mastoid effusions.
IMPRESSION: 1. Abnormal focal sulcal FLAIR hyperintensity and pial enhancement
involving the posterior right cerebrum, including the right
parietal, occipital and posterior temporal lobes. Findings are
nonspecific with differential considerations including
meningitis/encephalitis, postictal hyperemia, neurosarcoidosis,
vasculitis, and leptomeningeal carcinomatosis. Recommend neurology
consultation and correlation with lumbar puncture.
2. Moderate chronic microvascular ischemic disease.

Findings discussed with Dr. RUDI via telephone at [DATE].

## 2020-10-05 MED ORDER — GADOBUTROL 1 MMOL/ML IV SOLN
7.0000 mL | Freq: Once | INTRAVENOUS | Status: AC | PRN
  Administered 2020-10-05: 7 mL via INTRAVENOUS

## 2020-10-05 MED ORDER — LORAZEPAM 2 MG/ML IJ SOLN: 0.5000 mg | Freq: Once | INTRAMUSCULAR | Status: DC | PRN

## 2020-10-05 NOTE — Discharge Instructions (Addendum)
You are leaving Allison Watson before he completed the work-up.  You need admission to the hospital to further work this up.  There is risk to this including death or worsening condition.  Feel free to return at any time for further evaluation.  Follow-up soon as possible with either your primary care doctor or one of the neurologists.  Dr. Merlene Laughter does know about your condition.

## 2020-10-05 NOTE — ED Triage Notes (Signed)
Pt to er, pt states that she is here for altered mental status, states that she was here earlier today and left, states that when she got home she told her son and he told her to come back to the er.  MD at bedside .

## 2020-10-05 NOTE — ED Provider Notes (Addendum)
[facility] EMERGENCY DEPARTMENT Provider Note   CSN: [id number] Arrival date & time: 10/05/20  2116     History Chief Complaint  Patient presents with   Altered Mental Status    Allison Watson is a 80 y.o. female.   Altered Mental Status Associated symptoms: hallucinations   Associated symptoms: no abdominal pain, no headaches and no rash   Patient presents with hallucinations.  Has been going for a month now.  Initially thought to be secondary to medications however has been off those medications continues to see things.  They will be people walking down the hall and sometimes changes in pictures.  Previously had a headaches but that is resolved.  Not having fevers.  Been seen by me earlier today.  MRI done that showed some changes that could be meningitis encephalitis seizure related potentially sarcoid related.  Had discussed with Dr. Merlene Laughter and neuroradiology and recommended admission to the hospital for LP and further work-up.  However at that point patient was not willing to stay.  Left AMA.  Now coming back willing for admission.     Past Medical History:  Diagnosis Date   Arthritis    Lt arm   Gout    Hypertension     Patient Active Problem List   Diagnosis Date Noted   INSOMNIA, CHRONIC 05/08/2007   BRONCHITIS, ACUTE WITH BRONCHOSPASM 04/16/2007   CELLULITIS/ABSCESS, TRUNK 10/31/2006   MUSCLE SPASM 10/31/2006   HYPERTENSION 03/13/2006   ALLERGIC RHINITIS 03/13/2006   Osteoarthrosis, unspecified whether generalized or localized, unspecified site 03/13/2006   ARTHRITIS 03/13/2006    Past Surgical History:  Procedure Laterality Date   ABDOMINAL HYSTERECTOMY     TUBAL LIGATION       OB History   No obstetric history on file.     History reviewed. No pertinent family history.  Social History   Tobacco Use   Smoking status: Former    Pack years: 0.00   Smokeless tobacco: Never  Vaping Use   Vaping Use: Never used  Substance Use Topics   Alcohol  use: No   Drug use: No    Home Medications Prior to Admission medications   Medication Sig Start Date End Date Taking? Authorizing Provider  acetaminophen (TYLENOL) 500 MG tablet Take 500 mg by mouth every 6 (six) hours as needed for mild pain.     [provider]  allopurinol (ZYLOPRIM) 300 MG tablet Take 300 mg by mouth daily. 01/24/19   [provider]  cholecalciferol (VITAMIN D3) 25 MCG (1000 UNIT) tablet Take 1,000 Units by mouth daily.    [provider]  HYDROcodone-acetaminophen (NORCO/VICODIN) 5-325 MG tablet Take 1 every 8 hours if needed for bad headache that is not relieved by Tylenol or Motrin 07/21/19   Milton Ferguson, MD  ibuprofen (ADVIL) 200 MG tablet Take 200 mg by mouth daily.    [provider]  labetalol (NORMODYNE) 100 MG tablet Take 100 mg by mouth 2 (two) times daily. 07/21/19   [provider]  lisinopril-hydrochlorothiazide (PRINZIDE,ZESTORETIC) 20-12.5 MG per tablet Take 1 tablet by mouth daily.    [provider]  LORazepam (ATIVAN) 1 MG tablet Take 1 mg by mouth at bedtime. 01/08/19   [provider]  oxyCODONE (OXY IR/ROXICODONE) 5 MG immediate release tablet Take 5 mg by mouth 4 (four) times daily as needed for moderate pain or severe pain.  05/27/19   [provider]  prochlorperazine (COMPAZINE) 10 MG tablet Take 1 tablet (10 mg  total) by mouth every 6 (six) hours as needed for nausea or vomiting (or headache). [id number]   Delora Fuel, MD  verapamil (CALAN) 120 MG tablet Take 120 mg by mouth 2 (two) times daily.  08/16/15   [provider]    Allergies    Codeine and Penicillins  Review of Systems   Review of Systems  HENT:  Negative for congestion.   Cardiovascular:  Negative for chest pain.  Gastrointestinal:  Negative for abdominal pain.  Genitourinary:  Negative for flank pain.  Musculoskeletal:  Negative for gait problem.  Skin:  Negative for rash.  Neurological:  Negative  for headaches.  Psychiatric/Behavioral:  Positive for hallucinations.    Physical Exam Updated Vital Signs BP (!) 153/70 (BP Location: Left Arm)   Pulse 88   Temp 98 F (36.7 C) (Oral)   Resp 20   Ht 5' (1.524 m)   Wt 72.6 kg   SpO2 98%   BMI 31.25 kg/m   Physical Exam Vitals and nursing note reviewed.  HENT:     Head: Atraumatic.  Cardiovascular:     Rate and Rhythm: Regular rhythm.  Pulmonary:     Breath sounds: No wheezing or rhonchi.  Musculoskeletal:     Cervical back: Neck supple.  Skin:    General: Skin is warm.     Capillary Refill: Capillary refill takes less than 2 seconds.  Neurological:     Mental Status: She is alert and oriented to person, place, and time.  Psychiatric:        Mood and Affect: Mood normal.    ED Results / Procedures / Treatments   Labs (all labs ordered are listed, but only abnormal results are displayed) Labs Reviewed - No data to display  EKG None  Radiology MR Brain W and Wo Contrast  Result Date: 10/05/2020 CLINICAL DATA:  Psychosis.  Vision changes. EXAM: MRI HEAD WITHOUT AND WITH CONTRAST TECHNIQUE: Multiplanar, multiecho pulse sequences of the brain and surrounding structures were obtained without and with intravenous contrast. CONTRAST:  55m GADAVIST GADOBUTROL 1 MMOL/ML IV SOLN midline shift. COMPARISON:  CT head March 2021. FINDINGS: Brain: Abnormal sulcal FLAIR hyperintensity and curvilinear pial enhancement involving the posterior right cerebrum, including the parietal lobe, occipital lobe and posterior right temporal lobe. No restricted diffusion to suggest acute infarct. No subjacent encephalomalacia or associated susceptibility artifact. Mild atrophy with ex vacuo ventricular dilation. No hydrocephalus. Moderate scattered T2/FLAIR hyperintensities within the white matter, which are nonspecific but most likely related to chronic microvascular ischemic disease. No sizable extra-axial fluid collection. No mass lesion or midline  shift. Vascular: Major arterial flow voids are maintained skull base. Skull and upper cervical spine: Normal marrow signal. Sinuses/Orbits: Left maxillary sinus retention cyst. Unremarkable orbits. Other: No mastoid effusions. IMPRESSION: 1. Abnormal focal sulcal FLAIR hyperintensity and pial enhancement involving the posterior right cerebrum, including the right parietal, occipital and posterior temporal lobes. Findings are nonspecific with differential considerations including meningitis/encephalitis, postictal hyperemia, neurosarcoidosis, vasculitis, and leptomeningeal carcinomatosis. Recommend neurology consultation and correlation with lumbar puncture. 2. Moderate chronic microvascular ischemic disease. Findings discussed with Dr. PAlvino Chapelvia telephone at 4:45 PM. Electronically Signed   By: FMargaretha SheffieldMD   On: 10/05/2020 16:54    Procedures Procedures   Medications Ordered in ED Medications - No data to display  ED Course  I have reviewed the triage vital signs and the nursing notes.  Pertinent labs & imaging results that were available during my care of the patient were  reviewed by me and considered in my medical decision making (see chart for details).    MDM Rules/Calculators/A&P                          Patient with hallucinations.  Left AMA earlier today but now returns for admission.  Will need lumbar puncture.  Also will be seen by Dr. Merlene Laughter although last I discussed with him he believed the patient had left AMA.  Patient will benefit from a lumbar puncture but is very anxious and somewhat has not to have it done.  After discussion we feel if it would be best to have it done under fluoroscopic guidance tomorrow.  When I discussed with neurology at the thought that was not can be clinically harmful to wait until tomorrow.  I had discussed with radiology and they feel as though she will be really worked in tomorrow for it. Neurology had recommended cryptococcal antigen, ACE  in the CSF, and cytology be done additional to regular CSF labs. Will discuss with hospitalist for admission.  Patient had previously refused some of the blood work stating she just had it done recently.  I did get a BMP earlier that showed mild change in her creatinine.  Discussed with Dr. Denton Brick about admission.  She states she cannot take the admission because the CBC is not back.  Will read page after it returns. Final Clinical Impression(s) / ED Diagnoses Final diagnoses:  Hallucinations    Rx / DC Orders ED Discharge Orders     None        Davonna Belling, MD 10/05/20 2131    Davonna Belling, MD 10/05/20 2205

## 2020-10-05 NOTE — ED Notes (Signed)
Patient stated that she has had a death in her family and that she will make follow up appointments on Monday.

## 2020-10-05 NOTE — ED Provider Notes (Addendum)
[facility] EMERGENCY DEPARTMENT Provider Note   CSN: [id number] Arrival date & time: 10/05/20  1424     History Chief Complaint  Patient presents with   Hallucinations    Allison Watson is a 80 y.o. female.  HPI Patient presents with hallucinations.  Has had for the last 2 weeks to month.  States sees people around the house.  People walking that are not there.  We will see demons and other things in the picture frames.  Knows they are not there but still sees them.  Has had headaches at times.  Had an episode few months ago that began after some sleeping medicines.  States they stopped after he stopped sleeping medicine.  Had taken some sleeping medicines again and it started out.  States however for the last 2 weeks has been off both her sleeping medicines and her pain medicines and still having the events.  Patient's she says that she needs some sleeping medicines to help her sleep and to help stop the hallucinations.  States she is seeing her primary care doctor who checked blood work although she does not know with the results.    Past Medical History:  Diagnosis Date   Arthritis    Lt arm   Gout    Hypertension     Patient Active Problem List   Diagnosis Date Noted   INSOMNIA, CHRONIC 05/08/2007   BRONCHITIS, ACUTE WITH BRONCHOSPASM 04/16/2007   CELLULITIS/ABSCESS, TRUNK 10/31/2006   MUSCLE SPASM 10/31/2006   HYPERTENSION 03/13/2006   ALLERGIC RHINITIS 03/13/2006   Osteoarthrosis, unspecified whether generalized or localized, unspecified site 03/13/2006   ARTHRITIS 03/13/2006    Past Surgical History:  Procedure Laterality Date   ABDOMINAL HYSTERECTOMY     TUBAL LIGATION       OB History   No obstetric history on file.     History reviewed. No pertinent family history.  Social History   Tobacco Use   Smoking status: Former    Pack years: 0.00   Smokeless tobacco: Never  Vaping Use   Vaping Use: Never used  Substance Use Topics   Alcohol use: No    Drug use: No    Home Medications Prior to Admission medications   Medication Sig Start Date End Date Taking? Authorizing Provider  acetaminophen (TYLENOL) 500 MG tablet Take 500 mg by mouth every 6 (six) hours as needed for mild pain.     [provider]  allopurinol (ZYLOPRIM) 300 MG tablet Take 300 mg by mouth daily. 01/24/19   [provider]  cholecalciferol (VITAMIN D3) 25 MCG (1000 UNIT) tablet Take 1,000 Units by mouth daily.    [provider]  HYDROcodone-acetaminophen (NORCO/VICODIN) 5-325 MG tablet Take 1 every 8 hours if needed for bad headache that is not relieved by Tylenol or Motrin 07/21/19   Milton Ferguson, MD  ibuprofen (ADVIL) 200 MG tablet Take 200 mg by mouth daily.    [provider]  labetalol (NORMODYNE) 100 MG tablet Take 100 mg by mouth 2 (two) times daily. 07/21/19   [provider]  lisinopril-hydrochlorothiazide (PRINZIDE,ZESTORETIC) 20-12.5 MG per tablet Take 1 tablet by mouth daily.    [provider]  LORazepam (ATIVAN) 1 MG tablet Take 1 mg by mouth at bedtime. 01/08/19   [provider]  oxyCODONE (OXY IR/ROXICODONE) 5 MG immediate release tablet Take 5 mg by mouth 4 (four) times daily as needed for moderate pain or severe pain.  05/27/19   [provider]  prochlorperazine (COMPAZINE) 10 MG tablet Take 1 tablet (10 mg total) by mouth every 6 (six) hours as needed for nausea or vomiting (or headache). [id number]   Delora Fuel, MD  verapamil (CALAN) 120 MG tablet Take 120 mg by mouth 2 (two) times daily.  08/16/15   [provider]    Allergies    Codeine and Penicillins  Review of Systems   Review of Systems  Constitutional:  Negative for appetite change.  Respiratory:  Negative for shortness of breath.   Cardiovascular:  Negative for chest pain.  Gastrointestinal:  Negative for abdominal pain.  Genitourinary:  Negative for flank pain.  Musculoskeletal:  Negative for back pain.   Skin:  Negative for pallor.  Neurological:  Positive for headaches.  Psychiatric/Behavioral:  Positive for hallucinations.    Physical Exam Updated Vital Signs BP (!) 156/83 (BP Location: Right Arm)   Pulse 81   Temp 98.8 F (37.1 C) (Oral)   Resp (!) 22   Ht 5' (1.524 m)   Wt 72.6 kg   SpO2 100%   BMI 31.25 kg/m   Physical Exam Vitals reviewed.  Constitutional:      Appearance: Normal appearance.  HENT:     Head: Normocephalic.  Eyes:     Extraocular Movements: Extraocular movements intact.     Pupils: Pupils are equal, round, and reactive to light.  Cardiovascular:     Rate and Rhythm: Normal rate.  Pulmonary:     Breath sounds: No wheezing or rhonchi.  Abdominal:     Tenderness: There is no abdominal tenderness.  Musculoskeletal:        General: No tenderness.     Cervical back: Neck supple.  Skin:    General: Skin is warm.  Neurological:     General: No focal deficit present.     Mental Status: She is alert and oriented to person, place, and time.  Psychiatric:        Mood and Affect: Mood normal.        Behavior: Behavior normal.    ED Results / Procedures / Treatments   Labs (all labs ordered are listed, but only abnormal results are displayed) Labs Reviewed  BASIC METABOLIC PANEL - Abnormal; Notable for the following components:      Result Value   CO2 20 (*)    Glucose, Bld 101 (*)    BUN 24 (*)    Creatinine, Ser 2.00 (*)    GFR, Estimated 25 (*)    All other components within normal limits    EKG None  Radiology MR Brain W and Wo Contrast  Result Date: 10/05/2020 CLINICAL DATA:  Psychosis.  Vision changes. EXAM: MRI HEAD WITHOUT AND WITH CONTRAST TECHNIQUE: Multiplanar, multiecho pulse sequences of the brain and surrounding structures were obtained without and with intravenous contrast. CONTRAST:  70m GADAVIST GADOBUTROL 1 MMOL/ML IV SOLN midline shift. COMPARISON:  CT head March 2021. FINDINGS: Brain: Abnormal sulcal FLAIR hyperintensity  and curvilinear pial enhancement involving the posterior right cerebrum, including the parietal lobe, occipital lobe and posterior right temporal lobe. No restricted diffusion to suggest acute infarct. No subjacent encephalomalacia or associated susceptibility artifact. Mild atrophy with ex vacuo ventricular dilation. No hydrocephalus. Moderate scattered T2/FLAIR hyperintensities within the white matter, which are nonspecific but most likely related to chronic microvascular ischemic disease. No sizable extra-axial fluid collection. No mass lesion or midline shift. Vascular: Major arterial flow voids are maintained skull base. Skull and upper cervical spine: Normal marrow signal. Sinuses/Orbits: Left  maxillary sinus retention cyst. Unremarkable orbits. Other: No mastoid effusions. IMPRESSION: 1. Abnormal focal sulcal FLAIR hyperintensity and pial enhancement involving the posterior right cerebrum, including the right parietal, occipital and posterior temporal lobes. Findings are nonspecific with differential considerations including meningitis/encephalitis, postictal hyperemia, neurosarcoidosis, vasculitis, and leptomeningeal carcinomatosis. Recommend neurology consultation and correlation with lumbar puncture. 2. Moderate chronic microvascular ischemic disease. Findings discussed with Dr. Alvino Chapel via telephone at 4:45 PM. Electronically Signed   By: Margaretha Sheffield MD   On: 10/05/2020 16:54    Procedures Procedures   Medications Ordered in ED Medications  LORazepam (ATIVAN) injection 0.5 mg (has no administration in time range)  gadobutrol (GADAVIST) 1 MMOL/ML injection 7 mL (7 mLs Intravenous Contrast Given 10/05/20 1626)    ED Course  I have reviewed the triage vital signs and the nursing notes.  Pertinent labs & imaging results that were available during my care of the patient were reviewed by me and considered in my medical decision making (see chart for details).    MDM  Rules/Calculators/A&P                          Patient with hallucinations.  Has been going for the last couple months.  Sees people walking through the walls.  Other visual hallucinations.  Previously had had headaches but those have resolved.  Previously thought that hallucinations were secondary to medications but she has been off those medications for a couple weeks now.  MRI done and showed some abnormal FLAIR enhancement.  Would require LP.  Discussed with both neuroradiology and neurologist.  Plan for admission and LP although LP could wait till tomorrow as needed.  However after discussion with patient she is not willing to be admitted to the hospital.  Aware of risks including death or worsening debility.  Appears awake and appropriate and able to make the decision.  States she has a funeral on Friday.  States she may be able to do something on Monday.  Dr. Merlene Laughter knows the patient diet discussed with her although patient states she does not necessarily want to follow-up with him.  Also given information for [facility] neurology and [facility] neurology.  With LP, Dr. Merlene Laughter had requested cryptococcal antigen ,ACE from the CSF, and cytology. Final Clinical Impression(s) / ED Diagnoses Final diagnoses:  Hallucinations    Rx / DC Orders ED Discharge Orders     None        Davonna Belling, MD 10/05/20 Gabriel Rung    Davonna Belling, MD 10/05/20 1753

## 2020-10-05 NOTE — ED Triage Notes (Signed)
Pt to the ED complaining of hallucinations for the past month.  Pt states she is seeing shadow people, people walking through her walls, and socks turning into snakes.

## 2020-10-06 ENCOUNTER — Other Ambulatory Visit: Payer: Self-pay

## 2020-10-06 ENCOUNTER — Observation Stay (HOSPITAL_COMMUNITY): Payer: Medicare Other

## 2020-10-06 DIAGNOSIS — G9341 Metabolic encephalopathy: Secondary | ICD-10-CM

## 2020-10-06 LAB — COMPREHENSIVE METABOLIC PANEL
ALT: 18 U/L (ref 0–44)
ALT: 15 U/L (ref 0–44)
AST: 17 U/L (ref 15–41)
AST: 15 U/L (ref 15–41)
Albumin: 3.4 g/dL — ABNORMAL LOW (ref 3.5–5.0)
Albumin: 3.1 g/dL — ABNORMAL LOW (ref 3.5–5.0)
Alkaline Phosphatase: 72 U/L (ref 38–126)
Alkaline Phosphatase: 65 U/L (ref 38–126)
Anion gap: 9 (ref 5–15)
Anion gap: 6 (ref 5–15)
BUN: 25 mg/dL — ABNORMAL HIGH (ref 8–23)
BUN: 24 mg/dL — ABNORMAL HIGH (ref 8–23)
CO2: 23 mmol/L (ref 22–32)
CO2: 26 mmol/L (ref 22–32)
Calcium: 9.4 mg/dL (ref 8.9–10.3)
Calcium: 9.2 mg/dL (ref 8.9–10.3)
Chloride: 107 mmol/L (ref 98–111)
Chloride: 107 mmol/L (ref 98–111)
Creatinine, Ser: 1.99 mg/dL — ABNORMAL HIGH (ref 0.44–1.00)
Creatinine, Ser: 1.83 mg/dL — ABNORMAL HIGH (ref 0.44–1.00)
GFR, Estimated: 25 mL/min — ABNORMAL LOW (ref >60)
GFR, Estimated: 28 mL/min — ABNORMAL LOW (ref >60)
Glucose, Bld: 100 mg/dL — ABNORMAL HIGH (ref 70–99)
Glucose, Bld: 99 mg/dL (ref 70–99)
Potassium: 3.3 mmol/L — ABNORMAL LOW (ref 3.5–5.1)
Potassium: 3.7 mmol/L (ref 3.5–5.1)
Sodium: 139 mmol/L (ref 135–145)
Sodium: 139 mmol/L (ref 135–145)
Total Bilirubin: 0.7 mg/dL (ref 0.3–1.2)
Total Bilirubin: 0.4 mg/dL (ref 0.3–1.2)
Total Protein: 6.2 g/dL — ABNORMAL LOW (ref 6.5–8.1)
Total Protein: 5.7 g/dL — ABNORMAL LOW (ref 6.5–8.1)

## 2020-10-06 LAB — CBC WITH DIFFERENTIAL/PLATELET
Abs Immature Granulocytes: 0.02 10*3/uL (ref 0.00–0.07)
Abs Immature Granulocytes: 0.02 10*3/uL (ref 0.00–0.07)
Basophils Absolute: 0 10*3/uL (ref 0.0–0.1)
Basophils Absolute: 0.1 10*3/uL (ref 0.0–0.1)
Basophils Relative: 1 %
Basophils Relative: 1 %
Eosinophils Absolute: 0.3 10*3/uL (ref 0.0–0.5)
Eosinophils Absolute: 0.3 10*3/uL (ref 0.0–0.5)
Eosinophils Relative: 5 %
Eosinophils Relative: 6 %
HCT: 38.8 % (ref 36.0–46.0)
HCT: 39.2 % (ref 36.0–46.0)
Hemoglobin: 12.2 g/dL (ref 12.0–15.0)
Hemoglobin: 12.4 g/dL (ref 12.0–15.0)
Immature Granulocytes: 0 %
Immature Granulocytes: 0 %
Lymphocytes Relative: 29 %
Lymphocytes Relative: 30 %
Lymphs Abs: 1.7 10*3/uL (ref 0.7–4.0)
Lymphs Abs: 1.8 10*3/uL (ref 0.7–4.0)
MCH: 27.4 pg (ref 26.0–34.0)
MCH: 27.4 pg (ref 26.0–34.0)
MCHC: 31.1 g/dL (ref 30.0–36.0)
MCHC: 32 g/dL (ref 30.0–36.0)
MCV: 85.8 fL (ref 80.0–100.0)
MCV: 87.9 fL (ref 80.0–100.0)
Monocytes Absolute: 0.6 10*3/uL (ref 0.1–1.0)
Monocytes Absolute: 0.8 10*3/uL (ref 0.1–1.0)
Monocytes Relative: 10 %
Monocytes Relative: 13 %
Neutro Abs: 3 10*3/uL (ref 1.7–7.7)
Neutro Abs: 3.1 10*3/uL (ref 1.7–7.7)
Neutrophils Relative %: 52 %
Neutrophils Relative %: 53 %
Platelets: 193 10*3/uL (ref 150–400)
Platelets: 214 10*3/uL (ref 150–400)
RBC: 4.46 MIL/uL (ref 3.87–5.11)
RBC: 4.52 MIL/uL (ref 3.87–5.11)
RDW: 16.5 % — ABNORMAL HIGH (ref 11.5–15.5)
RDW: 16.5 % — ABNORMAL HIGH (ref 11.5–15.5)
WBC: 5.6 10*3/uL (ref 4.0–10.5)
WBC: 6 10*3/uL (ref 4.0–10.5)
nRBC: 0 % (ref 0.0–0.2)
nRBC: 0 % (ref 0.0–0.2)

## 2020-10-06 LAB — CSF CELL COUNT WITH DIFFERENTIAL
RBC Count, CSF: 4 /mm3 — ABNORMAL HIGH
RBC Count, CSF: 6 /mm3 — ABNORMAL HIGH
Tube #: 1
Tube #: 4
WBC, CSF: 2 /mm3 (ref 0–5)
WBC, CSF: 2 /mm3 (ref 0–5)

## 2020-10-06 LAB — TSH: TSH: 1.633 u[IU]/mL (ref 0.350–4.500)

## 2020-10-06 LAB — MAGNESIUM: Magnesium: 1.7 mg/dL (ref 1.7–2.4)

## 2020-10-06 LAB — PROTEIN AND GLUCOSE, CSF
Glucose, CSF: 69 mg/dL (ref 40–70)
Total  Protein, CSF: 39 mg/dL (ref 15–45)

## 2020-10-06 LAB — HIV ANTIBODY (ROUTINE TESTING W REFLEX): HIV Screen 4th Generation wRfx: NONREACTIVE

## 2020-10-06 IMAGING — RF DG SPINAL PUNCT LUMBAR DIAG WITH FL CT GUIDANCE
1 series · 1 of 1 positions shown · non-contrast
Comparison: MR brain [DATE]

CLINICAL DATA: Hallucinations, confusion, history hypertension

EXAM:
DIAGNOSTIC LUMBAR PUNCTURE UNDER FLUOROSCOPIC GUIDANCE

[Series 1: cp_standard · 0.17mm/px · 1 of 1 slices shown]
[im 1/1]
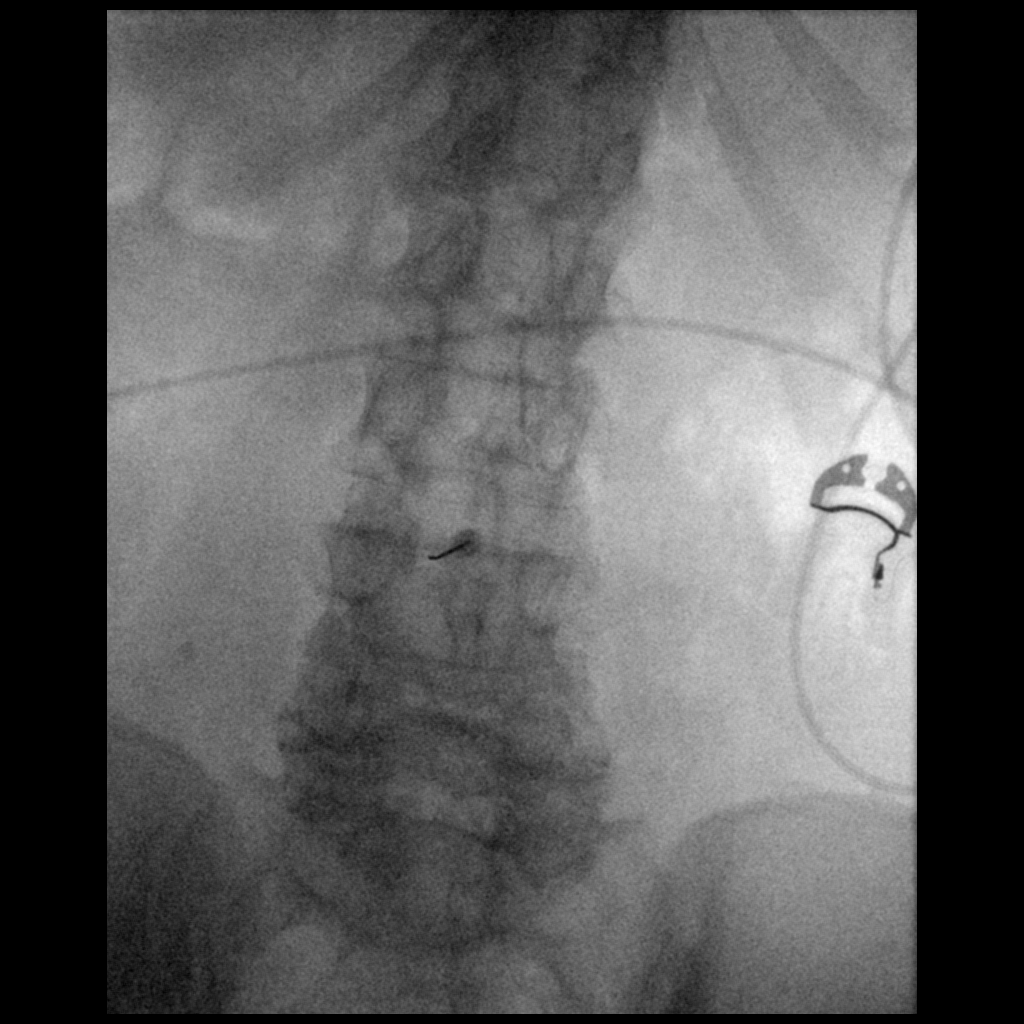

[1 of 1 positions shown; findings below may reference images not displayed]

FLUOROSCOPY TIME:  Fluoroscopy Time:  0 minutes 6 seconds

Radiation Exposure Index (if provided by the fluoroscopic device):
9.3

Number of Acquired Spot Images: 1

PROCEDURE:
Procedure, benefits, and risks were discussed with the patient,
including alternatives.

Patient's questions were answered.

Written informed consent was obtained.

Timeout protocol followed.

Patient placed prone.

L3-L4 disc space was localized under fluoroscopy.

Skin prepped and draped in usual sterile fashion.

Skin and soft tissues anesthetized with 5 mL of 1% lidocaine.

22 gauge needle was advanced into the spinal canal under oblique
approach where clear colorless CSF was encountered with an opening
pressure of 3 cm H2O (prone).

10 mL of CSF was obtained in 4 tubes for requested analysis.

Procedure tolerated very well by patient without immediate
complication.
IMPRESSION: Fluoroscopic guided lumbar puncture as above.

## 2020-10-06 MED ORDER — OLANZAPINE 5 MG PO TABS
7.5000 mg | ORAL_TABLET | Freq: Every day | ORAL | Status: DC
  Administered 2020-10-06 – 2020-10-08 (×3): 7.5 mg via ORAL
  Filled 2020-10-06 (×3): qty 2

## 2020-10-06 MED ORDER — POTASSIUM CHLORIDE CRYS ER 20 MEQ PO TBCR
40.0000 meq | EXTENDED_RELEASE_TABLET | ORAL | Status: AC
  Administered 2020-10-06 (×2): 40 meq via ORAL
  Filled 2020-10-06 (×2): qty 2

## 2020-10-06 MED ORDER — POVIDONE-IODINE 10 % EX SOLN
CUTANEOUS | Status: AC
  Filled 2020-10-06: qty 15

## 2020-10-06 MED ORDER — ONDANSETRON HCL 4 MG PO TABS: 4.0000 mg | ORAL_TABLET | Freq: Four times a day (QID) | ORAL | Status: DC | PRN

## 2020-10-06 MED ORDER — LORAZEPAM 0.5 MG PO TABS: 0.5000 mg | ORAL_TABLET | Freq: Every evening | ORAL | Status: DC | PRN

## 2020-10-06 MED ORDER — ONDANSETRON HCL 4 MG/2ML IJ SOLN: 4.0000 mg | Freq: Four times a day (QID) | INTRAMUSCULAR | Status: DC | PRN

## 2020-10-06 MED ORDER — LABETALOL HCL 200 MG PO TABS
100.0000 mg | ORAL_TABLET | Freq: Two times a day (BID) | ORAL | Status: DC
  Administered 2020-10-06 – 2020-10-09 (×6): 100 mg via ORAL
  Filled 2020-10-06 (×7): qty 1

## 2020-10-06 MED ORDER — SODIUM CHLORIDE 0.9 % IV SOLN: INTRAVENOUS | Status: DC

## 2020-10-06 MED ORDER — ACETAMINOPHEN 325 MG PO TABS: 650.0000 mg | ORAL_TABLET | Freq: Four times a day (QID) | ORAL | Status: DC | PRN

## 2020-10-06 MED ORDER — ACETAMINOPHEN 650 MG RE SUPP: 650.0000 mg | Freq: Four times a day (QID) | RECTAL | Status: DC | PRN

## 2020-10-06 MED ORDER — VERAPAMIL HCL 120 MG PO TABS
120.0000 mg | ORAL_TABLET | Freq: Two times a day (BID) | ORAL | Status: DC
  Administered 2020-10-06 – 2020-10-09 (×6): 120 mg via ORAL
  Filled 2020-10-06 (×7): qty 1

## 2020-10-06 NOTE — Procedures (Signed)
Preprocedure Dx: Confusion, AMS Postprocedure Dx: Confusion, AMS Procedure:  Fluoroscopically guided lumbar puncture Radiologist:  Thornton Papas Anesthesia:  5 ml of 1% lidocaine Specimen:  10 ml CSF, clear colorless EBL:   < 1 ml Opening pressure: 3 cm [id number] Complications: None

## 2020-10-06 NOTE — Consult Note (Signed)
Honomu A. Merlene Laughter, MD     [email or web address]          Allison Watson is an 80 y.o. female.   ASSESSMENT/PLAN: Terrifying and frightening visual hallucinations likely due to MRI findings especially abnormal signal/  enhancement of the p.m. on tele right parietal occipital region. The differential diagnosis includes chronic inflammatory or infectious etiology and malignancy.  Spinal fluid analysis initially is fine but additional results are pending. Cryptococcal antigen and the CSF angiotensin-converting enzymes also added. Given the frightening nature of these hallucinations, neuroleptics will be added to suppress them.  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  Chronic headaches which actually are much better with Topamax and improvement in shoulder pain.   This is a 80 year old black female who presents with the progressive visual hallucination over last several months. The hallucinations were initially not disruptive or disturbing to the patient but they have become progressively terrifying the point of crying and being very afraid. She has to have people staying with her because of the fair associated with these hallucinations which are worse at nighttime. She reports them causing difficulty sleeping. She has mostly formed hallucinations including scary snakes, lizards and others scary animals. She also reports people were trying to hurt or harm her or make fun of her. She did have significant headaches but she reports the headaches have got better with the improvement of her shoulder pain. She also was placed on Topamax which seems to have helped. She denies any visual symptoms. She denies any symptoms involving the arms or legs such as numbness or weakness.  She reports being cognitively well for the most  part without alteration of mentation although she seems to ramble a lot and have difficulty focusing. It is obvious that these hallucinations is very scary for the patient. The review systems otherwise negative.   GENERAL:  On entering the exam room, she became scared and started to get out of the bed. She is quite talkative and nervous.  HEENT:  Neck is supple no trauma noted.  ABDOMEN: soft  EXTREMITIES: No edema   BACK: Normal  SKIN: Normal by inspection.    MENTAL STATUS: Alert and oriented. Speech, language and cognition are generally intact. Judgment and insight normal.   CRANIAL NERVES: Pupils are equal, round and reactive to light and accomodation; extra ocular movements are full, there is no significant nystagmus; visual fields are full; upper and lower facial muscles are normal in strength and symmetric, there is no flattening of the nasolabial folds; tongue is midline; uvula is midline; shoulder elevation is normal.  MOTOR: Normal tone, bulk and strength; no pronator drift.  COORDINATION: Left finger to nose is normal, right finger to nose is normal, No rest tremor; no intention tremor; no postural tremor; no bradykinesia.  REFLEXES: Deep tendon reflexes are symmetrical and normal.   SENSATION: Normal to light touch, temperature, and pain.      Blood pressure (!) 115/49, pulse 63, temperature 98.5 F (36.9 C), temperature source Oral, resp. rate 17, height '4\' 11"'$  (1.499 m), weight 80.8 kg, SpO2 100 %.  Past Medical History:  Diagnosis Date   Arthritis    Lt arm   Gout    Hypertension     Past Surgical History:  Procedure Laterality Date   ABDOMINAL HYSTERECTOMY     TUBAL LIGATION      History reviewed. No  pertinent family history.  Social History:  reports that she has quit smoking. She has never used smokeless tobacco. She reports that she does not drink alcohol and does not use drugs.  Allergies:  Allergies  Allergen Reactions   Codeine Rash    Penicillins Rash    Has patient had a PCN reaction causing immediate rash, facial/tongue/throat swelling, SOB or lightheadedness with hypotension: Yes Has patient had a PCN reaction causing severe rash involving mucus membranes or skin necrosis: No Has patient had a PCN reaction that required hospitalization No Has patient had a PCN reaction occurring within the last 10 years: No If all of the above answers are "NO", then may proceed with Cephalosporin use.     Medications: Prior to Admission medications   Medication Sig Start Date End Date Taking? Authorizing Provider  acetaminophen (TYLENOL) 500 MG tablet Take 500 mg by mouth every 6 (six) hours as needed for mild pain.     [provider]  albuterol (VENTOLIN HFA) 108 (90 Base) MCG/ACT inhaler Inhale 2 puffs into the lungs in the morning, at noon, in the evening, and at bedtime. 09/03/20   [provider]  allopurinol (ZYLOPRIM) 300 MG tablet Take 300 mg by mouth daily. 01/24/19   [provider]  cholecalciferol (VITAMIN D3) 25 MCG (1000 UNIT) tablet Take 1,000 Units by mouth daily.    [provider]  clonazePAM (KLONOPIN) 1 MG tablet Take 1 mg by mouth at bedtime. 09/07/20   [provider]  diltiazem (CARDIZEM CD) 240 MG 24 hr capsule Take 240 mg by mouth daily. 07/20/20   [provider]  HYDROcodone-acetaminophen (NORCO/VICODIN) 5-325 MG tablet Take 1 every 8 hours if needed for bad headache that is not relieved by Tylenol or Motrin 07/21/19   Milton Ferguson, MD  ibuprofen (ADVIL) 200 MG tablet Take 200 mg by mouth daily.    [provider]  labetalol (NORMODYNE) 100 MG tablet Take 100 mg by mouth 2 (two) times daily. 07/21/19   [provider]  lisinopril-hydrochlorothiazide (PRINZIDE,ZESTORETIC) 20-12.5 MG per tablet Take 1 tablet by mouth daily.    [provider]  LORazepam (ATIVAN) 1 MG tablet Take 1 mg by mouth at bedtime. 01/08/19   [provider]  naproxen (NAPROSYN) 500 MG tablet Take 500 mg by mouth 2 (two) times daily as needed. 09/20/20   [provider]  oxyCODONE (OXY IR/ROXICODONE) 5 MG immediate release tablet Take 5 mg by mouth 4 (four) times daily as needed for moderate pain or severe pain.  05/27/19   [provider]  prochlorperazine (COMPAZINE) 10 MG tablet Take 1 tablet (10 mg total) by mouth every 6 (six) hours as needed for nausea or vomiting (or headache). [id number]   Delora Fuel, MD  QUEtiapine (SEROQUEL) 50 MG tablet Take 50 mg by mouth at bedtime. 09/30/20   [provider]  traZODone (DESYREL) 50 MG tablet Take 50 mg by mouth daily. 09/22/20   [provider]  verapamil (CALAN) 120 MG tablet Take 120 mg by mouth 2 (two) times daily.  08/16/15   [provider]    Scheduled Meds:  labetalol  100 mg Oral BID   verapamil  120 mg Oral BID   Continuous Infusions:  sodium chloride 100 mL/hr at 10/06/20 1602   PRN Meds:.acetaminophen **OR** acetaminophen, LORazepam, ondansetron **OR** ondansetron (ZOFRAN) IV     Results for orders placed or performed during the hospital encounter of 10/05/20 (from the past 48 hour(s))  Resp Panel by RT-PCR (Flu A&B, Covid) Nasopharyngeal Swab     Status: None   Collection Time: 10/05/20  9:33 PM   Specimen: Nasopharyngeal Swab; Nasopharyngeal(NP) swabs in vial transport medium  Result Value Ref Range   SARS Coronavirus 2 by RT PCR NEGATIVE NEGATIVE    Comment: (NOTE) SARS-CoV-2 target nucleic acids are NOT DETECTED.  The SARS-CoV-2 RNA is generally detectable in upper respiratory specimens during the acute phase of infection. The lowest concentration of SARS-CoV-2 viral copies this assay can detect is 138 copies/mL. A negative result does not preclude SARS-Cov-2 infection and should not be used as the sole basis for treatment or other patient management decisions. A negative result may occur with  improper specimen collection/handling,  submission of specimen other than nasopharyngeal swab, presence of viral mutation(s) within the areas targeted by this assay, and inadequate number of viral copies(<138 copies/mL). A negative result must be combined with clinical observations, patient history, and epidemiological information. The expected result is Negative.  Fact Sheet for Patients:  EntrepreneurPulse.com.au  Fact Sheet for Healthcare Providers:  IncredibleEmployment.be  This test is no t yet approved or cleared by the [place] FDA and  has been authorized for detection and/or diagnosis of SARS-CoV-2 by FDA under an Emergency Use Authorization (EUA). This EUA will remain  in effect (meaning this test can be used) for the duration of the COVID-19 declaration under Section 564(b)(1) of the Act, 21 U.S.C.section 360bbb-3(b)(1), unless the authorization is terminated  or revoked sooner.       Influenza A by PCR NEGATIVE NEGATIVE   Influenza B by PCR NEGATIVE NEGATIVE    Comment: (NOTE) The Xpert Xpress SARS-CoV-2/FLU/RSV plus assay is intended as an aid in the diagnosis of influenza from Nasopharyngeal swab specimens and should not be used as a sole basis for treatment. Nasal washings and aspirates are unacceptable for Xpert Xpress SARS-CoV-2/FLU/RSV testing.  Fact Sheet for Patients: EntrepreneurPulse.com.au  Fact Sheet for Healthcare Providers: IncredibleEmployment.be  This test is not yet approved or cleared by the [place] FDA and has been authorized for detection and/or diagnosis of SARS-CoV-2 by FDA under an Emergency Use Authorization (EUA). This EUA will remain in effect (meaning this test can be used) for the duration of the COVID-19 declaration under Section 564(b)(1) of the Act, 21 U.S.C. section 360bbb-3(b)(1), unless the authorization is terminated or revoked.  Performed at [facility], [address]  [place], [place] [address]   CBC with Differential     Status: Abnormal   Collection Time: 10/05/20 10:28 PM  Result Value Ref Range   WBC 5.8 4.0 - 10.5 K/uL   RBC 4.46 3.87 - 5.11 MIL/uL   Hemoglobin 12.3 12.0 - 15.0 g/dL   HCT 38.3 36.0 - 46.0 %   MCV 85.9 80.0 - 100.0 fL   MCH 27.6 26.0 - 34.0 pg   MCHC 32.1 30.0 - 36.0 g/dL   RDW 16.5 (H) 11.5 - 15.5 %   Platelets 206 150 - 400 K/uL   nRBC 0.0 0.0 - 0.2 %   Neutrophils Relative % 57 %   Neutro Abs 3.4 1.7 - 7.7 K/uL   Lymphocytes Relative 26 %   Lymphs Abs 1.5 0.7 - 4.0 K/uL   Monocytes Relative 11 %   Monocytes Absolute 0.6 0.1 - 1.0 K/uL   Eosinophils Relative 5 %   Eosinophils Absolute 0.3 0.0 - 0.5 K/uL   Basophils Relative 1 %   Basophils Absolute 0.0 0.0 - 0.1 K/uL  Immature Granulocytes 0 %   Abs Immature Granulocytes 0.01 0.00 - 0.07 K/uL    Comment: Performed at [facility], [address] [place], [place] [address]  Comprehensive metabolic panel     Status: Abnormal   Collection Time: 10/06/20 12:18 AM  Result Value Ref Range   Sodium 139 135 - 145 mmol/L   Potassium 3.3 (L) 3.5 - 5.1 mmol/L    Comment: DELTA CHECK NOTED   Chloride 107 98 - 111 mmol/L   CO2 23 22 - 32 mmol/L   Glucose, Bld 100 (H) 70 - 99 mg/dL    Comment: Glucose reference range applies only to samples taken after fasting for at least 8 hours.   BUN 25 (H) 8 - 23 mg/dL   Creatinine, Ser 1.99 (H) 0.44 - 1.00 mg/dL   Calcium 9.4 8.9 - 10.3 mg/dL   Total Protein 6.2 (L) 6.5 - 8.1 g/dL   Albumin 3.4 (L) 3.5 - 5.0 g/dL   AST 17 15 - 41 U/L   ALT 18 0 - 44 U/L   Alkaline Phosphatase 72 38 - 126 U/L   Total Bilirubin 0.7 0.3 - 1.2 mg/dL   GFR, Estimated 25 (L) >60 mL/min    Comment: (NOTE) Calculated using the CKD-EPI Creatinine Equation (2021)    Anion gap 9 5 - 15    Comment: Performed at [facility], [address] [place], [place] [address]  CBC with Differential     Status: Abnormal   Collection Time: 10/06/20 12:18 AM  Result Value  Ref Range   WBC 5.6 4.0 - 10.5 K/uL   RBC 4.52 3.87 - 5.11 MIL/uL   Hemoglobin 12.4 12.0 - 15.0 g/dL   HCT 38.8 36.0 - 46.0 %   MCV 85.8 80.0 - 100.0 fL   MCH 27.4 26.0 - 34.0 pg   MCHC 32.0 30.0 - 36.0 g/dL   RDW 16.5 (H) 11.5 - 15.5 %   Platelets 214 150 - 400 K/uL   nRBC 0.0 0.0 - 0.2 %   Neutrophils Relative % 53 %   Neutro Abs 3.0 1.7 - 7.7 K/uL   Lymphocytes Relative 30 %   Lymphs Abs 1.7 0.7 - 4.0 K/uL   Monocytes Relative 10 %   Monocytes Absolute 0.6 0.1 - 1.0 K/uL   Eosinophils Relative 6 %   Eosinophils Absolute 0.3 0.0 - 0.5 K/uL   Basophils Relative 1 %   Basophils Absolute 0.0 0.0 - 0.1 K/uL   Immature Granulocytes 0 %   Abs Immature Granulocytes 0.02 0.00 - 0.07 K/uL    Comment: Performed at [facility], [address] [place], [place] [address]  HIV Antibody (routine testing w rflx)     Status: None   Collection Time: 10/06/20 12:18 AM  Result Value Ref Range   HIV Screen 4th Generation wRfx Non Reactive Non Reactive    Comment: Performed at [facility], [address] [place], [place] [address]  Culture, blood (routine x 2)     Status: None (Preliminary result)   Collection Time: 10/06/20 12:18 AM   Specimen: BLOOD RIGHT HAND  Result Value Ref Range   Specimen Description BLOOD RIGHT HAND    Special Requests      BOTTLES DRAWN AEROBIC AND ANAEROBIC Blood Culture adequate volume   Culture      NO GROWTH < 24 HOURS Performed at [facility], [address], [place], [place] [address]    Report Status PENDING   Culture, blood (  routine x 2)     Status: None (Preliminary result)   Collection Time: 10/06/20 12:25 AM   Specimen: BLOOD LEFT HAND  Result Value Ref Range   Specimen Description BLOOD LEFT HAND    Special Requests AEROBIC BOTTLE ONLY Blood Culture adequate volume    Culture      NO GROWTH < 24 HOURS Performed at [facility], [address] [place], [place] [address]    Report Status PENDING   Comprehensive metabolic panel     Status:  Abnormal   Collection Time: 10/06/20  5:07 AM  Result Value Ref Range   Sodium 139 135 - 145 mmol/L   Potassium 3.7 3.5 - 5.1 mmol/L   Chloride 107 98 - 111 mmol/L   CO2 26 22 - 32 mmol/L   Glucose, Bld 99 70 - 99 mg/dL    Comment: Glucose reference range applies only to samples taken after fasting for at least 8 hours.   BUN 24 (H) 8 - 23 mg/dL   Creatinine, Ser 1.83 (H) 0.44 - 1.00 mg/dL   Calcium 9.2 8.9 - 10.3 mg/dL   Total Protein 5.7 (L) 6.5 - 8.1 g/dL   Albumin 3.1 (L) 3.5 - 5.0 g/dL   AST 15 15 - 41 U/L   ALT 15 0 - 44 U/L   Alkaline Phosphatase 65 38 - 126 U/L   Total Bilirubin 0.4 0.3 - 1.2 mg/dL   GFR, Estimated 28 (L) >60 mL/min    Comment: (NOTE) Calculated using the CKD-EPI Creatinine Equation (2021)    Anion gap 6 5 - 15    Comment: Performed at [facility], [address] [place], [place] [address]  Magnesium     Status: None   Collection Time: 10/06/20  5:07 AM  Result Value Ref Range   Magnesium 1.7 1.7 - 2.4 mg/dL    Comment: Performed at [facility], [address] [place], [place] [address]  CBC WITH DIFFERENTIAL     Status: Abnormal   Collection Time: 10/06/20  5:07 AM  Result Value Ref Range   WBC 6.0 4.0 - 10.5 K/uL   RBC 4.46 3.87 - 5.11 MIL/uL   Hemoglobin 12.2 12.0 - 15.0 g/dL   HCT 39.2 36.0 - 46.0 %   MCV 87.9 80.0 - 100.0 fL   MCH 27.4 26.0 - 34.0 pg   MCHC 31.1 30.0 - 36.0 g/dL   RDW 16.5 (H) 11.5 - 15.5 %   Platelets 193 150 - 400 K/uL   nRBC 0.0 0.0 - 0.2 %   Neutrophils Relative % 52 %   Neutro Abs 3.1 1.7 - 7.7 K/uL   Lymphocytes Relative 29 %   Lymphs Abs 1.8 0.7 - 4.0 K/uL   Monocytes Relative 13 %   Monocytes Absolute 0.8 0.1 - 1.0 K/uL   Eosinophils Relative 5 %   Eosinophils Absolute 0.3 0.0 - 0.5 K/uL   Basophils Relative 1 %   Basophils Absolute 0.1 0.0 - 0.1 K/uL   Immature Granulocytes 0 %   Abs Immature Granulocytes 0.02 0.00 - 0.07 K/uL    Comment: Performed at [facility], [address], [place], [place] [address]   TSH     Status: None   Collection Time: 10/06/20  5:07 AM  Result Value Ref Range   TSH 1.633 0.350 - 4.500 uIU/mL    Comment: Performed by a 3rd Generation assay with a functional sensitivity of <=0.01 uIU/mL. Performed at [facility], [address] [place], [place]  [address]   Culture, fungus without smear     Status: None (Preliminary result)   Collection Time: 10/06/20  9:56 AM   Specimen: CSF; Other  Result Value Ref Range   Specimen Description CSF    Special Requests      NONE Performed at [facility] [address], [place], [place] [address]    Culture PENDING    Report Status PENDING   CSF cell count with differential collection tube #: 4     Status: Abnormal   Collection Time: 10/06/20 11:15 AM  Result Value Ref Range   Tube # 1     Comment: CORRECTED ON 06/15 AT 1505: PREVIOUSLY REPORTED AS 4   Color, CSF COLORLESS COLORLESS   Appearance, CSF CLEAR (A) CLEAR   Supernatant CLEAR    RBC Count, CSF 6 (H) 0 /cu mm   WBC, CSF 2 0 - 5 /cu mm   Segmented Neutrophils-CSF TOO FEW TO COUNT, SMEAR AVAILABLE FOR REVIEW 0 - 6 %   Lymphs, CSF TOO FEW TO COUNT, SMEAR AVAILABLE FOR REVIEW 40 - 80 %   Monocyte-Macrophage-Spinal Fluid TOO FEW TO COUNT, SMEAR AVAILABLE FOR REVIEW 15 - 45 %   Eosinophils, CSF TOO FEW TO COUNT, SMEAR AVAILABLE FOR REVIEW 0 - 1 %   Other Cells, CSF PREDOMINATE CELL SEEN LYMPHOCYTE     Comment: Performed at [facility], [address] [place], [place] [address]  CSF culture w Gram Stain     Status: None (Preliminary result)   Collection Time: 10/06/20 11:15 AM   Specimen: CSF; Cerebrospinal Fluid  Result Value Ref Range   Specimen Description CSF    Special Requests NONE    Gram Stain      NO ORGANISMS SEEN NO WBC SEEN Performed at [facility], [address] [place],  [place] [address]    Culture PENDING    Report Status PENDING   Protein and glucose, CSF     Status: None   Collection Time: 10/06/20 11:15 AM  Result Value Ref Range    Glucose, CSF 69 40 - 70 mg/dL   Total  Protein, CSF 39 15 - 45 mg/dL    Comment: Performed at [facility], [address] [place], [place] [address]  CSF cell count with differential collection tube #: 1     Status: Abnormal   Collection Time: 10/06/20 11:15 AM  Result Value Ref Range   Tube # 4     Comment: CORRECTED ON 06/15 AT 1505: PREVIOUSLY REPORTED AS 1   Color, CSF COLORLESS COLORLESS   Appearance, CSF CLEAR (A) CLEAR   Supernatant CLEAR    RBC Count, CSF 4 (H) 0 /cu mm   WBC, CSF 2 0 - 5 /cu mm   Segmented Neutrophils-CSF TOO FEW TO COUNT, SMEAR AVAILABLE FOR REVIEW 0 - 6 %   Lymphs, CSF TOO FEW TO COUNT, SMEAR AVAILABLE FOR REVIEW 40 - 80 %   Monocyte-Macrophage-Spinal Fluid TOO FEW TO COUNT, SMEAR AVAILABLE FOR REVIEW 15 - 45 %   Eosinophils, CSF TOO FEW TO COUNT, SMEAR AVAILABLE FOR REVIEW 0 - 1 %    Comment: Performed at [facility], [address], [place], [place] [address]    Studies/Results:   BRAIN MRI FINDINGS: Brain: Abnormal sulcal FLAIR hyperintensity and curvilinear pial enhancement involving the posterior right cerebrum, including the parietal lobe, occipital lobe and posterior right temporal lobe. No restricted diffusion to suggest acute infarct. No subjacent encephalomalacia or associated susceptibility  artifact. Mild atrophy with ex vacuo ventricular dilation. No hydrocephalus. Moderate scattered T2/FLAIR hyperintensities within the white matter, which are nonspecific but most likely related to chronic microvascular ischemic disease. No sizable extra-axial fluid collection. No mass lesion or midline shift.   Vascular: Major arterial flow voids are maintained skull base.   Skull and upper cervical spine: Normal marrow signal.   Sinuses/Orbits: Left maxillary sinus retention cyst. Unremarkable orbits.   Other: No mastoid effusions.   IMPRESSION: 1. Abnormal focal sulcal FLAIR hyperintensity and pial enhancement involving the posterior right  cerebrum, including the right parietal, occipital and posterior temporal lobes. Findings are nonspecific with differential considerations including meningitis/encephalitis, postictal hyperemia, neurosarcoidosis, vasculitis, and leptomeningeal carcinomatosis. Recommend neurology consultation and correlation with lumbar puncture. 2. Moderate chronic microvascular ischemic disease.      The brain MRI scan is reviewed in person. There is marked deep white matter increased signal most noted in a confluent fashion posteriorly around the posterior horn of the lateral ventricle. There is signal abnormality bright post early involving the p.m. on the right parietal area which some mild enhancement noted.  Phoenix Dresser A. Merlene Laughter, M.D.  Diplomate, Tax adviser of Psychiatry and Neurology ( Neurology). 10/06/2020, 7:48 PM

## 2020-10-06 NOTE — H&P (Addendum)
TRH H&P    Patient Demographics:    Allison Watson, is a 80 y.o. female  MRN: [id number]  DOB - 1940-11-04  Admit Date - 10/05/2020  Referring MD/NP/PA: Alvino Chapel  Outpatient Primary MD for the patient is Lucia Gaskins, MD  Patient coming from: Home  Chief complaint- hallucinations   HPI:    Allison Watson  is a 80 y.o. female,With a past medical history of insomnia, chronic pain, hypertension, gout, arthritis presents to the ED with a chief complaint of hallucinations.  Patient reports that she is seeing shadow people come through the walls, out of other peoples homes and into her home, she seemed to be worms in the kitchen, she is seeing water dripping from the ceilings, she is seeing people come out of pictures, she is seeing babies in the grass, she is seeing dogs, bite of the ground.  This is been going on off and on since October.  In October it was determined that a new medication she was started on was causing the hallucinations.  The medication was discontinued and the hallucinations went away for couple of months.  She then went to her PCP for insomnia, and was started on a new sleeping medication.  She reports she does not know the name of that medication either.  The hallucinations came back and within a week she quit the sleeping medication.  The hallucinations have remained since then.  She reports that there 24/7 at this point.  She reports she is not sleeping at night because people are watching her and making mean faces at her.  These people make threatening gestures but she does not feel like they can hurt her.  Patient reports that she feels feverish, but does not explain why.  Daughter at bedside keeps trying to tell me what the patient is trying to say, but the patient goes off on tangents so she is not able to finish what she is saying.  When the daughter tries to explain what patient saying,  patient cuts her off and says is not what she is saying.  She is alert and oriented x3, but there is an obvious component of confusion with any amount of real conversation.  Plan was to do LP in the ER.  Patient initially left AMA because she was too anxious about having LP done.  She then came back to the ER was ready to be admitted.  ED provider reports that he spoke with IR and neuro and everybody is okay with her waiting to get the LP until morning.  Neuro did not feel the need to cover with antibiotics tonight per ED provider report.  Patient does report that she saw her PCP on the day prior to admission, and was advised to stop oxycodone.  She reports she has not taken oxycodone at least a month anyways.   Patient does not smoke, does not drink, and is vaccinated for COVID.  Patient is full code.  In the ED Temp 98, heart rate 78-88, respiratory rate 17-22, blood pressure 156/83, satting at  98% No leukocytosis, hemoglobin 12.3, chemistry panel reveals a AKI with a creatinine of 2.0, up from 1.4 Respiratory panel is negative MRI brain shows a hyperintensity and pial enhancement in the posterior right cerebrum including the parietal, occipital, posterior temporal lobes.  They remarked that the differential is wide including meningitis, encephalitis, post ictal hyperemia, neurosarcoid, and vasculitis.  Since patient has not had a seizure postictal hyperemia can be ruled out.  Since it is been going on several months the patient is not septic most likely meningitis cannot be ruled out as well. Patient does report that she was recently started on an antibiotic for UTI, check UA and urine culture   Review of systems:    In addition to the HPI above,  Feels feverish No Headache, No changes with Vision or hearing, No problems swallowing food or Liquids, No Chest pain, Cough or Shortness of Breath, No Abdominal pain, No Nausea or Vomiting, bowel movements are regular, No Blood in stool or  Urine, No dysuria, No new skin rashes or bruises, No new joints pains-aches,  No new weakness, tingling, numbness in any extremity, No recent weight gain or loss, No polyuria, polydypsia or polyphagia,   All other systems reviewed and are negative.    Past History of the following :    Past Medical History:  Diagnosis Date   Arthritis    Lt arm   Gout    Hypertension       Past Surgical History:  Procedure Laterality Date   ABDOMINAL HYSTERECTOMY     TUBAL LIGATION        Social History:      Social History   Tobacco Use   Smoking status: Former    Pack years: 0.00   Smokeless tobacco: Never  Substance Use Topics   Alcohol use: No       Family History :    History reviewed. No pertinent family history. Family history hypertension   Home Medications:   Prior to Admission medications   Medication Sig Start Date End Date Taking? Authorizing Provider  acetaminophen (TYLENOL) 500 MG tablet Take 500 mg by mouth every 6 (six) hours as needed for mild pain.     [provider]  allopurinol (ZYLOPRIM) 300 MG tablet Take 300 mg by mouth daily. 01/24/19   [provider]  cholecalciferol (VITAMIN D3) 25 MCG (1000 UNIT) tablet Take 1,000 Units by mouth daily.    [provider]  HYDROcodone-acetaminophen (NORCO/VICODIN) 5-325 MG tablet Take 1 every 8 hours if needed for bad headache that is not relieved by Tylenol or Motrin 07/21/19   Milton Ferguson, MD  ibuprofen (ADVIL) 200 MG tablet Take 200 mg by mouth daily.    [provider]  labetalol (NORMODYNE) 100 MG tablet Take 100 mg by mouth 2 (two) times daily. 07/21/19   [provider]  lisinopril-hydrochlorothiazide (PRINZIDE,ZESTORETIC) 20-12.5 MG per tablet Take 1 tablet by mouth daily.    [provider]  LORazepam (ATIVAN) 1 MG tablet Take 1 mg by mouth at bedtime. 01/08/19   [provider]  oxyCODONE (OXY IR/ROXICODONE) 5 MG immediate release tablet  Take 5 mg by mouth 4 (four) times daily as needed for moderate pain or severe pain.  05/27/19   [provider]  prochlorperazine (COMPAZINE) 10 MG tablet Take 1 tablet (10 mg total) by mouth every 6 (six) hours as needed for nausea or vomiting (or headache). [id number]   Delora Fuel, MD  verapamil (CALAN) 120 MG tablet  Take 120 mg by mouth 2 (two) times daily.  08/16/15   [provider]     Allergies:     Allergies  Allergen Reactions   Codeine Rash   Penicillins Rash    Has patient had a PCN reaction causing immediate rash, facial/tongue/throat swelling, SOB or lightheadedness with hypotension: Yes Has patient had a PCN reaction causing severe rash involving mucus membranes or skin necrosis: No Has patient had a PCN reaction that required hospitalization No Has patient had a PCN reaction occurring within the last 10 years: No If all of the above answers are "NO", then may proceed with Cephalosporin use.      Physical Exam:   Vitals  Blood pressure 96/68, pulse 81, temperature 98.5 F (36.9 C), temperature source Oral, resp. rate 18, height '4\' 11"'$  (1.499 m), weight 80.8 kg, SpO2 100 %. 1.  General: Patient lying supine in bed,  no acute distress   2. Psychiatric: Alert and oriented x 3, anxious and behavior normal for situation,  cooperative with exam   3. Neurologic: Speech and language are normal, face is symmetric, moves all 4 extremities voluntarily,   4. HEENMT:  Head is atraumatic, normocephalic, pupils reactive to light, neck is supple, trachea is midline, mucous membranes are moist   5. Respiratory : Lungs are clear to auscultation bilaterally without wheezing, rhonchi, rales, no cyanosis, no increase in work of breathing or accessory muscle use   6. Cardiovascular : Heart rate normal, rhythm is regular, no murmurs, rubs or gallops, no peripheral edema, peripheral pulses palpated   7. Gastrointestinal:  Abdomen is soft, nondistended, nontender to  palpation bowel sounds active, no masses or organomegaly palpated   8. Skin:  Skin is warm, dry and intact without rashes, acute lesions, or ulcers on limited exam   9.Musculoskeletal:  No acute deformities or trauma, no asymmetry in tone, no peripheral edema, peripheral pulses palpated, no tenderness to palpation in the extremities    Data Review:    CBC Recent Labs  Lab 10/05/20 2228 10/06/20 0018  WBC 5.8 5.6  HGB 12.3 12.4  HCT 38.3 38.8  PLT 206 214  MCV 85.9 85.8  MCH 27.6 27.4  MCHC 32.1 32.0  RDW 16.5* 16.5*  LYMPHSABS 1.5 1.7  MONOABS 0.6 0.6  EOSABS 0.3 0.3  BASOSABS 0.0 0.0   ------------------------------------------------------------------------------------------------------------------  Results for orders placed or performed during the hospital encounter of 10/05/20 (from the past 48 hour(s))  Resp Panel by RT-PCR (Flu A&B, Covid) Nasopharyngeal Swab     Status: None   Collection Time: 10/05/20  9:33 PM   Specimen: Nasopharyngeal Swab; Nasopharyngeal(NP) swabs in vial transport medium  Result Value Ref Range   SARS Coronavirus 2 by RT PCR NEGATIVE NEGATIVE    Comment: (NOTE) SARS-CoV-2 target nucleic acids are NOT DETECTED.  The SARS-CoV-2 RNA is generally detectable in upper respiratory specimens during the acute phase of infection. The lowest concentration of SARS-CoV-2 viral copies this assay can detect is 138 copies/mL. A negative result does not preclude SARS-Cov-2 infection and should not be used as the sole basis for treatment or other patient management decisions. A negative result may occur with  improper specimen collection/handling, submission of specimen other than nasopharyngeal swab, presence of viral mutation(s) within the areas targeted by this assay, and inadequate number of viral copies(<138 copies/mL). A negative result must be combined with clinical observations, patient history, and epidemiological information. The expected result  is Negative.  Fact Sheet for Patients:  EntrepreneurPulse.com.au  Fact Sheet for Healthcare Providers:  IncredibleEmployment.be  This test is no t yet approved or cleared by the [place] FDA and  has been authorized for detection and/or diagnosis of SARS-CoV-2 by FDA under an Emergency Use Authorization (EUA). This EUA will remain  in effect (meaning this test can be used) for the duration of the COVID-19 declaration under Section 564(b)(1) of the Act, 21 U.S.C.section 360bbb-3(b)(1), unless the authorization is terminated  or revoked sooner.       Influenza A by PCR NEGATIVE NEGATIVE   Influenza B by PCR NEGATIVE NEGATIVE    Comment: (NOTE) The Xpert Xpress SARS-CoV-2/FLU/RSV plus assay is intended as an aid in the diagnosis of influenza from Nasopharyngeal swab specimens and should not be used as a sole basis for treatment. Nasal washings and aspirates are unacceptable for Xpert Xpress SARS-CoV-2/FLU/RSV testing.  Fact Sheet for Patients: EntrepreneurPulse.com.au  Fact Sheet for Healthcare Providers: IncredibleEmployment.be  This test is not yet approved or cleared by the [place] FDA and has been authorized for detection and/or diagnosis of SARS-CoV-2 by FDA under an Emergency Use Authorization (EUA). This EUA will remain in effect (meaning this test can be used) for the duration of the COVID-19 declaration under Section 564(b)(1) of the Act, 21 U.S.C. section 360bbb-3(b)(1), unless the authorization is terminated or revoked.  Performed at [facility], [address], [place], [place] [address]   CBC with Differential     Status: Abnormal   Collection Time: 10/05/20 10:28 PM  Result Value Ref Range   WBC 5.8 4.0 - 10.5 K/uL   RBC 4.46 3.87 - 5.11 MIL/uL   Hemoglobin 12.3 12.0 - 15.0 g/dL   HCT 38.3 36.0 - 46.0 %   MCV 85.9 80.0 - 100.0 fL   MCH 27.6 26.0 - 34.0 pg   MCHC 32.1 30.0 -  36.0 g/dL   RDW 16.5 (H) 11.5 - 15.5 %   Platelets 206 150 - 400 K/uL   nRBC 0.0 0.0 - 0.2 %   Neutrophils Relative % 57 %   Neutro Abs 3.4 1.7 - 7.7 K/uL   Lymphocytes Relative 26 %   Lymphs Abs 1.5 0.7 - 4.0 K/uL   Monocytes Relative 11 %   Monocytes Absolute 0.6 0.1 - 1.0 K/uL   Eosinophils Relative 5 %   Eosinophils Absolute 0.3 0.0 - 0.5 K/uL   Basophils Relative 1 %   Basophils Absolute 0.0 0.0 - 0.1 K/uL   Immature Granulocytes 0 %   Abs Immature Granulocytes 0.01 0.00 - 0.07 K/uL    Comment: Performed at [facility], [address], [place], [place] [address]  Comprehensive metabolic panel     Status: Abnormal   Collection Time: 10/06/20 12:18 AM  Result Value Ref Range   Sodium 139 135 - 145 mmol/L   Potassium 3.3 (L) 3.5 - 5.1 mmol/L    Comment: DELTA CHECK NOTED   Chloride 107 98 - 111 mmol/L   CO2 23 22 - 32 mmol/L   Glucose, Bld 100 (H) 70 - 99 mg/dL    Comment: Glucose reference range applies only to samples taken after fasting for at least 8 hours.   BUN 25 (H) 8 - 23 mg/dL   Creatinine, Ser 1.99 (H) 0.44 - 1.00 mg/dL   Calcium 9.4 8.9 - 10.3 mg/dL   Total Protein 6.2 (L) 6.5 - 8.1 g/dL   Albumin 3.4 (L) 3.5 - 5.0 g/dL   AST 17 15 - 41 U/L   ALT 18  0 - 44 U/L   Alkaline Phosphatase 72 38 - 126 U/L   Total Bilirubin 0.7 0.3 - 1.2 mg/dL   GFR, Estimated 25 (L) >60 mL/min    Comment: (NOTE) Calculated using the CKD-EPI Creatinine Equation (2021)    Anion gap 9 5 - 15    Comment: Performed at [facility], [address], [place], [place] [address]  CBC with Differential     Status: Abnormal   Collection Time: 10/06/20 12:18 AM  Result Value Ref Range   WBC 5.6 4.0 - 10.5 K/uL   RBC 4.52 3.87 - 5.11 MIL/uL   Hemoglobin 12.4 12.0 - 15.0 g/dL   HCT 38.8 36.0 - 46.0 %   MCV 85.8 80.0 - 100.0 fL   MCH 27.4 26.0 - 34.0 pg   MCHC 32.0 30.0 - 36.0 g/dL   RDW 16.5 (H) 11.5 - 15.5 %   Platelets 214 150 - 400 K/uL   nRBC 0.0 0.0 - 0.2 %   Neutrophils Relative %  53 %   Neutro Abs 3.0 1.7 - 7.7 K/uL   Lymphocytes Relative 30 %   Lymphs Abs 1.7 0.7 - 4.0 K/uL   Monocytes Relative 10 %   Monocytes Absolute 0.6 0.1 - 1.0 K/uL   Eosinophils Relative 6 %   Eosinophils Absolute 0.3 0.0 - 0.5 K/uL   Basophils Relative 1 %   Basophils Absolute 0.0 0.0 - 0.1 K/uL   Immature Granulocytes 0 %   Abs Immature Granulocytes 0.02 0.00 - 0.07 K/uL    Comment: Performed at [facility], [address] [place], [place] [address]    Chemistries  Recent Labs  Lab 10/05/20 1546 10/06/20 0018  NA 139 139  K 4.1 3.3*  CL 109 107  CO2 20* 23  GLUCOSE 101* 100*  BUN 24* 25*  CREATININE 2.00* 1.99*  CALCIUM 9.4 9.4  AST  --  17  ALT  --  18  ALKPHOS  --  72  BILITOT  --  0.7   ------------------------------------------------------------------------------------------------------------------  ------------------------------------------------------------------------------------------------------------------ GFR: Estimated Creatinine Clearance: 21.8 mL/min (A) (by C-G formula based on SCr of 1.99 mg/dL (H)). Liver Function Tests: Recent Labs  Lab 10/06/20 0018  AST 17  ALT 18  ALKPHOS 72  BILITOT 0.7  PROT 6.2*  ALBUMIN 3.4*   No results for input(s): LIPASE, AMYLASE in the last 168 hours. No results for input(s): AMMONIA in the last 168 hours. Coagulation Profile: No results for input(s): INR, PROTIME in the last 168 hours. Cardiac Enzymes: No results for input(s): CKTOTAL, CKMB, CKMBINDEX, TROPONINI in the last 168 hours. BNP (last 3 results) No results for input(s): PROBNP in the last 8760 hours. HbA1C: No results for input(s): HGBA1C in the last 72 hours. CBG: No results for input(s): GLUCAP in the last 168 hours. Lipid Profile: No results for input(s): CHOL, HDL, LDLCALC, TRIG, CHOLHDL, LDLDIRECT in the last 72 hours. Thyroid Function Tests: No results for input(s): TSH, T4TOTAL, FREET4, T3FREE, THYROIDAB in the last 72 hours. Anemia  Panel: No results for input(s): VITAMINB12, FOLATE, FERRITIN, TIBC, IRON, RETICCTPCT in the last 72 hours.  --------------------------------------------------------------------------------------------------------------- Urine analysis: No results found for: COLORURINE, APPEARANCEUR, LABSPEC, PHURINE, GLUCOSEU, HGBUR, BILIRUBINUR, KETONESUR, PROTEINUR, UROBILINOGEN, NITRITE, LEUKOCYTESUR    Imaging Results:    MR Brain W and Wo Contrast  Result Date: 10/05/2020 CLINICAL DATA:  Psychosis.  Vision changes. EXAM: MRI HEAD WITHOUT AND WITH CONTRAST TECHNIQUE: Multiplanar, multiecho pulse sequences of the brain and surrounding structures were obtained without  and with intravenous contrast. CONTRAST:  36m GADAVIST GADOBUTROL 1 MMOL/ML IV SOLN midline shift. COMPARISON:  CT head March 2021. FINDINGS: Brain: Abnormal sulcal FLAIR hyperintensity and curvilinear pial enhancement involving the posterior right cerebrum, including the parietal lobe, occipital lobe and posterior right temporal lobe. No restricted diffusion to suggest acute infarct. No subjacent encephalomalacia or associated susceptibility artifact. Mild atrophy with ex vacuo ventricular dilation. No hydrocephalus. Moderate scattered T2/FLAIR hyperintensities within the white matter, which are nonspecific but most likely related to chronic microvascular ischemic disease. No sizable extra-axial fluid collection. No mass lesion or midline shift. Vascular: Major arterial flow voids are maintained skull base. Skull and upper cervical spine: Normal marrow signal. Sinuses/Orbits: Left maxillary sinus retention cyst. Unremarkable orbits. Other: No mastoid effusions. IMPRESSION: 1. Abnormal focal sulcal FLAIR hyperintensity and pial enhancement involving the posterior right cerebrum, including the right parietal, occipital and posterior temporal lobes. Findings are nonspecific with differential considerations including meningitis/encephalitis, postictal  hyperemia, neurosarcoidosis, vasculitis, and leptomeningeal carcinomatosis. Recommend neurology consultation and correlation with lumbar puncture. 2. Moderate chronic microvascular ischemic disease. Findings discussed with Dr. PAlvino Chapelvia telephone at 4:45 PM. Electronically Signed   By: FMargaretha SheffieldMD   On: 10/05/2020 16:54       Assessment & Plan:    Active Problems:   Acute metabolic encephalopathy   Acute metabolic encephalopathy MRI brain with nonspecific changes LP in the a.m. Holding anticoagulation for possible LP Inpatient consult to neuro Check TSH Polypharmacy?  Holding Compazine, Norco, oxycodone,  Reducing benzo Continue to monitor  Hypertension Holding lisinopril hydrochlorothiazide in the setting of AKI Continue labetalol and verapamil AKI Continue IV fluids Hold nephrotoxic agents when possible Trend in the a.m. Mild protein calorie malnutrition Encourage nutrient dense food choices Continue to monitor    DVT Prophylaxis-   SCDs  AM Labs Ordered, also please review Full Orders  Family Communication: Admission, patients condition and plan of care including tests being ordered have been discussed with the patient and daughter who indicate understanding and agree with the plan and Code Status.  Code Status: Full  Admission status: Observation  Time spent in minutes : [place]

## 2020-10-06 NOTE — Progress Notes (Signed)
  Patient seen and evaluated, chart reviewed, please see EMR for updated orders. Please see full H&P dictated by admitting physician Dr. Clearence Ped for same date of service.    Brief Summary:- 80 y.o. female,With a past medical history of insomnia, chronic pain, hypertension, gout, arthritis admitted on 10/06/20 with confusion/psychosis and acute metabolic encephalopathy--  A/p 1)Acute Metabolic Encephalopathy-- S/p LP on 10/06/20---CSF Fluid studies are re-assuring from infection standpoint, oligoclonal bands pending --Blood and CSF cultures are pending  MRI Brain from 10/05/20--- -Abnormal focal sulcal FLAIR hyperintensity and pial enhancement involving the posterior right cerebrum, including the right parietal, occipital and posterior temporal lobes. Findings are nonspecific with differential considerations including meningitis/encephalitis, postictal hyperemia, neurosarcoidosis, vasculitis, and leptomeningeal carcinomatosis. 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 Please see full H&P dictated by admitting physician Dr. Clearence Ped for same date of service.   Roxan Hockey, MD

## 2020-10-07 DIAGNOSIS — M109 Gout, unspecified: Secondary | ICD-10-CM | POA: Diagnosis present

## 2020-10-07 DIAGNOSIS — R519 Headache, unspecified: Secondary | ICD-10-CM | POA: Diagnosis present

## 2020-10-07 DIAGNOSIS — I6783 Posterior reversible encephalopathy syndrome: Secondary | ICD-10-CM | POA: Diagnosis present

## 2020-10-07 DIAGNOSIS — F29 Unspecified psychosis not due to a substance or known physiological condition: Secondary | ICD-10-CM | POA: Diagnosis present

## 2020-10-07 DIAGNOSIS — G8929 Other chronic pain: Secondary | ICD-10-CM | POA: Diagnosis present

## 2020-10-07 DIAGNOSIS — E441 Mild protein-calorie malnutrition: Secondary | ICD-10-CM | POA: Diagnosis present

## 2020-10-07 DIAGNOSIS — M25519 Pain in unspecified shoulder: Secondary | ICD-10-CM | POA: Diagnosis present

## 2020-10-07 DIAGNOSIS — Z79899 Other long term (current) drug therapy: Secondary | ICD-10-CM | POA: Diagnosis not present

## 2020-10-07 DIAGNOSIS — Z90710 Acquired absence of both cervix and uterus: Secondary | ICD-10-CM | POA: Diagnosis not present

## 2020-10-07 DIAGNOSIS — Z8249 Family history of ischemic heart disease and other diseases of the circulatory system: Secondary | ICD-10-CM | POA: Diagnosis not present

## 2020-10-07 DIAGNOSIS — N179 Acute kidney failure, unspecified: Secondary | ICD-10-CM | POA: Diagnosis present

## 2020-10-07 DIAGNOSIS — G9341 Metabolic encephalopathy: Secondary | ICD-10-CM | POA: Diagnosis not present

## 2020-10-07 DIAGNOSIS — Z87891 Personal history of nicotine dependence: Secondary | ICD-10-CM | POA: Diagnosis not present

## 2020-10-07 DIAGNOSIS — N184 Chronic kidney disease, stage 4 (severe): Secondary | ICD-10-CM | POA: Diagnosis present

## 2020-10-07 DIAGNOSIS — Z20822 Contact with and (suspected) exposure to covid-19: Secondary | ICD-10-CM | POA: Diagnosis present

## 2020-10-07 DIAGNOSIS — G4700 Insomnia, unspecified: Secondary | ICD-10-CM | POA: Diagnosis present

## 2020-10-07 DIAGNOSIS — M1990 Unspecified osteoarthritis, unspecified site: Secondary | ICD-10-CM | POA: Diagnosis present

## 2020-10-07 DIAGNOSIS — R443 Hallucinations, unspecified: Secondary | ICD-10-CM | POA: Diagnosis present

## 2020-10-07 DIAGNOSIS — Z880 Allergy status to penicillin: Secondary | ICD-10-CM | POA: Diagnosis not present

## 2020-10-07 DIAGNOSIS — I129 Hypertensive chronic kidney disease with stage 1 through stage 4 chronic kidney disease, or unspecified chronic kidney disease: Secondary | ICD-10-CM | POA: Diagnosis present

## 2020-10-07 DIAGNOSIS — Z6831 Body mass index (BMI) 31.0-31.9, adult: Secondary | ICD-10-CM | POA: Diagnosis not present

## 2020-10-07 DIAGNOSIS — Z885 Allergy status to narcotic agent status: Secondary | ICD-10-CM | POA: Diagnosis not present

## 2020-10-07 LAB — RAPID URINE DRUG SCREEN, HOSP PERFORMED
Amphetamines: NOT DETECTED
Barbiturates: NOT DETECTED
Benzodiazepines: NOT DETECTED
Cocaine: NOT DETECTED
Opiates: NOT DETECTED
Tetrahydrocannabinol: NOT DETECTED

## 2020-10-07 LAB — CRYPTOCOCCAL ANTIGEN, CSF: Crypto Ag: NEGATIVE

## 2020-10-07 LAB — VDRL, CSF: VDRL Quant, CSF: NONREACTIVE

## 2020-10-07 MED ORDER — TOPIRAMATE 25 MG PO TABS
50.0000 mg | ORAL_TABLET | Freq: Two times a day (BID) | ORAL | Status: DC
  Administered 2020-10-08 – 2020-10-09 (×3): 50 mg via ORAL
  Filled 2020-10-07 (×4): qty 2

## 2020-10-07 MED ORDER — HYDRALAZINE HCL 20 MG/ML IJ SOLN: 10.0000 mg | Freq: Four times a day (QID) | INTRAMUSCULAR | Status: DC | PRN

## 2020-10-07 NOTE — Care Management Obs Status (Signed)
MEDICARE OBSERVATION STATUS NOTIFICATION   Patient Details  Name: Allison Watson MRN: [id number] Date of Birth: Apr 09, 1941   Medicare Observation Status Notification Given:  Yes    Tommy Medal 10/07/2020, 11:01 AM

## 2020-10-07 NOTE — Progress Notes (Signed)
Patient Demographics:    Allison Watson, is a 80 y.o. female, DOB - June 27, 1940, [id number]  Admit date - 10/05/2020   Admitting Physician Peightyn Roberson Denton Brick, MD  Outpatient Primary MD for the patient is Lucia Gaskins, MD  LOS - 0   Chief Complaint  Patient presents with   Altered Mental Status        Subjective:    Allison Watson today has no fevers, no emesis,  No chest pain,   -Headaches, occasional episodes of disorientation and frightening psychosis/hallucinations persist  Assessment  & Plan :    Principal Problem:   Acute metabolic encephalopathy Active Problems:   Essential hypertension   Psychosis ([place])  Brief Summary:- 80 y.o. female,With a past medical history of insomnia, chronic pain, hypertension, gout, arthritis admitted on 10/06/20 with confusion/psychosis and acute metabolic encephalopathy--with abnormal MRI brain findings  A/p 1) acute metabolic encephalopathy/psychosis -Abnormal brain MRI findings raise concerns about possible meningitis/encephalitis Vs postictal hyperemia Vs neurosarcoidosis, Vs vasculitis Vs leptomeningeal carcinomatosis -S/p LP on 10/06/20--- CSF Fluid studies from 10/06/2020 are re-assuring from infection standpoint, oligoclonal bands pending --Blood and CSF cultures are pending CSF Cryptococcal antigen is Neg CSF angiotensin-converting enzymespending -Neurology consult appreciated -Headaches and hallucinations persist -TSH WNL -Zyprexa 7.5 mg every afternoon added by neurologist   2)HTN-- Hold Lisinopril/HCTZ due to AKI -c/n Verapamil  120 mg bid and Labetalol 100 mg bid  may use IV Hydralazine 10 mg  Every 4 hours Prn for systolic blood pressure over 170 mmhg   3)AKI----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   --creatinine on admission= 2.0 , baseline creatinine =  1.4 to 1.5  , creatinine is now= 1.83,  ---renally adjust medications, avoid  nephrotoxic agents / dehydration  / hypotension -Continue IV fluids, encourage increased oral intake, and Lisinopril/HCTZ has been discontinued  4) chronic headaches--Topamax and verapamil for headache prevention  Disposition/Need for in-Hospital Stay- patient unable to be discharged at this time due to -abnormal neuro findings awaiting further CSF studies, AKI requiring IV fluids  Status is: Inpatient  Remains inpatient appropriate because: Please see disposition above  Disposition: The patient is from: Home              Anticipated d/c is to: Home              Anticipated d/c date is: 1 day              Patient currently is not medically stable to d/c. Barriers: Not Clinically Stable-   Code Status :  -  Code Status: Full Code   Family Communication:    NA (patient is alert, awake and coherent)   Consults  :  Neuro  DVT Prophylaxis  :   - SCDs  SCDs Start: 10/06/20 0423   Lab Results  Component Value Date   PLT 193 10/06/2020    Inpatient Medications  Scheduled Meds:  labetalol  100 mg Oral BID   OLANZapine  7.5 mg Oral QHS   topiramate  50 mg Oral BID   verapamil  120 mg Oral BID   Continuous Infusions:  sodium chloride 100 mL/hr at 10/07/20 0350   PRN Meds:.acetaminophen **OR** acetaminophen, hydrALAZINE, LORazepam, ondansetron **OR** ondansetron (ZOFRAN) IV    Anti-infectives (  From admission, onward)    None         Objective:   Vitals:   10/07/20 0539 10/07/20 0946 10/07/20 1332 10/07/20 1333  BP: (!) 137/43 (!) 151/58 (!) 124/39 113/77  Pulse: (!) 55 67 61 62  Resp: 16 17    Temp: 98 F (36.7 C) 98.1 F (36.7 C) 98.6 F (37 C)   TempSrc:  Oral Oral   SpO2: 100% 100% 99% 97%  Weight:      Height:        Wt Readings from Last 3 Encounters:  10/06/20 80.8 kg  10/05/20 72.6 kg  07/21/19 70.8 kg     Intake/Output Summary (Last 24 hours) at 10/07/2020 1508 Last data filed at 10/07/2020 1300 Gross per 24 hour  Intake 2874.42 ml  Output --   Net 2874.42 ml     Physical Exam  Gen:- Awake Alert,  in no apparent distress  HEENT:- [place].AT, No sclera icterus Neck-Supple Neck,No JVD,.  Lungs-  CTAB , fair symmetrical air movement CV- S1, S2 normal, regular  Abd-  +ve B.Sounds, Abd Soft, No tenderness,    Extremity/Skin:- No  edema, pedal pulses present  Psych-affect is appropriate, oriented x3 Neuro-generalized weakness, no new focal deficits, no tremors   Data Review:   Micro Results Recent Results (from the past 240 hour(s))  Resp Panel by RT-PCR (Flu A&B, Covid) Nasopharyngeal Swab     Status: None   Collection Time: 10/05/20  9:33 PM   Specimen: Nasopharyngeal Swab; Nasopharyngeal(NP) swabs in vial transport medium  Result Value Ref Range Status   SARS Coronavirus 2 by RT PCR NEGATIVE NEGATIVE Final    Comment: (NOTE) SARS-CoV-2 target nucleic acids are NOT DETECTED.  The SARS-CoV-2 RNA is generally detectable in upper respiratory specimens during the acute phase of infection. The lowest concentration of SARS-CoV-2 viral copies this assay can detect is 138 copies/mL. A negative result does not preclude SARS-Cov-2 infection and should not be used as the sole basis for treatment or other patient management decisions. A negative result may occur with  improper specimen collection/handling, submission of specimen other than nasopharyngeal swab, presence of viral mutation(s) within the areas targeted by this assay, and inadequate number of viral copies(<138 copies/mL). A negative result must be combined with clinical observations, patient history, and epidemiological information. The expected result is Negative.  Fact Sheet for Patients:  EntrepreneurPulse.com.au  Fact Sheet for Healthcare Providers:  IncredibleEmployment.be  This test is no t yet approved or cleared by the [place] FDA and  has been authorized for detection and/or diagnosis of SARS-CoV-2 by FDA under an  Emergency Use Authorization (EUA). This EUA will remain  in effect (meaning this test can be used) for the duration of the COVID-19 declaration under Section 564(b)(1) of the Act, 21 U.S.C.section 360bbb-3(b)(1), unless the authorization is terminated  or revoked sooner.       Influenza A by PCR NEGATIVE NEGATIVE Final   Influenza B by PCR NEGATIVE NEGATIVE Final    Comment: (NOTE) The Xpert Xpress SARS-CoV-2/FLU/RSV plus assay is intended as an aid in the diagnosis of influenza from Nasopharyngeal swab specimens and should not be used as a sole basis for treatment. Nasal washings and aspirates are unacceptable for Xpert Xpress SARS-CoV-2/FLU/RSV testing.  Fact Sheet for Patients: EntrepreneurPulse.com.au  Fact Sheet for Healthcare Providers: IncredibleEmployment.be  This test is not yet approved or cleared by the [place] FDA and has been authorized for detection and/or diagnosis of SARS-CoV-2 by FDA  under an Emergency Use Authorization (EUA). This EUA will remain in effect (meaning this test can be used) for the duration of the COVID-19 declaration under Section 564(b)(1) of the Act, 21 U.S.C. section 360bbb-3(b)(1), unless the authorization is terminated or revoked.  Performed at [facility], [address], [place], [place] [address]   Culture, blood (routine x 2)     Status: None (Preliminary result)   Collection Time: 10/06/20 12:18 AM   Specimen: BLOOD RIGHT HAND  Result Value Ref Range Status   Specimen Description BLOOD RIGHT HAND  Final   Special Requests   Final    BOTTLES DRAWN AEROBIC AND ANAEROBIC Blood Culture adequate volume   Culture   Final    NO GROWTH 1 DAY Performed at [facility], [address], [place], [place] [address]    Report Status PENDING  Incomplete  Culture, blood (routine x 2)     Status: None (Preliminary result)   Collection Time: 10/06/20 12:25 AM   Specimen: BLOOD LEFT HAND  Result Value Ref  Range Status   Specimen Description BLOOD LEFT HAND  Final   Special Requests AEROBIC BOTTLE ONLY Blood Culture adequate volume  Final   Culture   Final    NO GROWTH 1 DAY Performed at [facility], [address], [place], [place] [address]    Report Status PENDING  Incomplete  Culture, fungus without smear     Status: None (Preliminary result)   Collection Time: 10/06/20  9:56 AM   Specimen: CSF; Other  Result Value Ref Range Status   Specimen Description CSF  Final   Special Requests   Final    NONE Performed at [facility], [address] [place], [place] [address]    Culture PENDING  Incomplete   Report Status PENDING  Incomplete  CSF culture w Gram Stain     Status: None (Preliminary result)   Collection Time: 10/06/20 11:15 AM   Specimen: CSF; Cerebrospinal Fluid  Result Value Ref Range Status   Specimen Description   Final    CSF Performed at [facility], [address] [place], [place] [address]    Special Requests   Final    NONE Performed at [facility], [address], [place], [place] [address]    Gram Stain   Final    NO ORGANISMS SEEN NO WBC SEEN Performed at [facility], [address] [place], [place] [address]    Culture   Final    NO GROWTH < 24 HOURS Performed at [facility], [place] [address] [place], [place] [address]    Report Status PENDING  Incomplete    Radiology Reports MR Brain W and Wo Contrast  Result Date: 10/05/2020 CLINICAL DATA:  Psychosis.  Vision changes. EXAM: MRI HEAD WITHOUT AND WITH CONTRAST TECHNIQUE: Multiplanar, multiecho pulse sequences of the brain and surrounding structures were obtained without and with intravenous contrast. CONTRAST:  44m GADAVIST GADOBUTROL 1 MMOL/ML IV SOLN midline shift. COMPARISON:  CT head March 2021. FINDINGS: Brain: Abnormal sulcal FLAIR hyperintensity and curvilinear pial enhancement involving the posterior right cerebrum, including the parietal lobe, occipital lobe and posterior right  temporal lobe. No restricted diffusion to suggest acute infarct. No subjacent encephalomalacia or associated susceptibility artifact. Mild atrophy with ex vacuo ventricular dilation. No hydrocephalus. Moderate scattered T2/FLAIR hyperintensities within the white matter, which are nonspecific but most likely related to chronic microvascular ischemic disease. No sizable extra-axial fluid collection. No mass lesion or midline shift.  Vascular: Major arterial flow voids are maintained skull base. Skull and upper cervical spine: Normal marrow signal. Sinuses/Orbits: Left maxillary sinus retention cyst. Unremarkable orbits. Other: No mastoid effusions. IMPRESSION: 1. Abnormal focal sulcal FLAIR hyperintensity and pial enhancement involving the posterior right cerebrum, including the right parietal, occipital and posterior temporal lobes. Findings are nonspecific with differential considerations including meningitis/encephalitis, postictal hyperemia, neurosarcoidosis, vasculitis, and leptomeningeal carcinomatosis. Recommend neurology consultation and correlation with lumbar puncture. 2. Moderate chronic microvascular ischemic disease. Findings discussed with Dr. Alvino Chapel via telephone at 4:45 PM. Electronically Signed   By: Margaretha Sheffield MD   On: 10/05/2020 16:54   DG FL GUIDED LUMBAR PUNCTURE  Result Date: 10/06/2020 CLINICAL DATA:  Hallucinations, confusion, history hypertension EXAM: DIAGNOSTIC LUMBAR PUNCTURE UNDER FLUOROSCOPIC GUIDANCE COMPARISON:  MR brain 10/05/2020 FLUOROSCOPY TIME:  Fluoroscopy Time:  0 minutes 6 seconds Radiation Exposure Index (if provided by the fluoroscopic device): 9.3 Number of Acquired Spot Images: 1 PROCEDURE: Procedure, benefits, and risks were discussed with the patient, including alternatives. Patient's questions were answered. Written informed consent was obtained. Timeout protocol followed. Patient placed prone. L3-L4 disc space was localized under fluoroscopy. Skin prepped  and draped in usual sterile fashion. Skin and soft tissues anesthetized with 5 mL of 1% lidocaine. 22 gauge needle was advanced into the spinal canal under oblique approach where clear colorless CSF was encountered with an opening pressure of 3 cm H2O (prone). 10 mL of CSF was obtained in 4 tubes for requested analysis. Procedure tolerated very well by patient without immediate complication. IMPRESSION: Fluoroscopic guided lumbar puncture as above. Electronically Signed   By: Lavonia Dana M.D.   On: 10/06/2020 13:07     CBC Recent Labs  Lab 10/05/20 2228 10/06/20 0018 10/06/20 0507  WBC 5.8 5.6 6.0  HGB 12.3 12.4 12.2  HCT 38.3 38.8 39.2  PLT 206 214 193  MCV 85.9 85.8 87.9  MCH 27.6 27.4 27.4  MCHC 32.1 32.0 31.1  RDW 16.5* 16.5* 16.5*  LYMPHSABS 1.5 1.7 1.8  MONOABS 0.6 0.6 0.8  EOSABS 0.3 0.3 0.3  BASOSABS 0.0 0.0 0.1    Chemistries  Recent Labs  Lab 10/05/20 1546 10/06/20 0018 10/06/20 0507  NA 139 139 139  K 4.1 3.3* 3.7  CL 109 107 107  CO2 20* 23 26  GLUCOSE 101* 100* 99  BUN 24* 25* 24*  CREATININE 2.00* 1.99* 1.83*  CALCIUM 9.4 9.4 9.2  MG  --   --  1.7  AST  --  17 15  ALT  --  18 15  ALKPHOS  --  72 65  BILITOT  --  0.7 0.4   ------------------------------------------------------------------------------------------------------------------ No results for input(s): CHOL, HDL, LDLCALC, TRIG, CHOLHDL, LDLDIRECT in the last 72 hours.  No results found for: HGBA1C ------------------------------------------------------------------------------------------------------------------ Recent Labs    10/06/20 0507  TSH 1.633   ------------------------------------------------------------------------------------------------------------------ No results for input(s): VITAMINB12, FOLATE, FERRITIN, TIBC, IRON, RETICCTPCT in the last 72 hours.  Coagulation profile No results for input(s): INR, PROTIME in the last 168 hours.  No results for input(s): DDIMER in the last  72 hours.  Cardiac Enzymes No results for input(s): CKMB, TROPONINI, MYOGLOBIN in the last 168 hours.  Invalid input(s): CK ------------------------------------------------------------------------------------------------------------------ No results found for: BNP   Roxan Hockey M.D on 10/07/2020 at 3:08 PM  Go to [email or web address] - for contact info  [facility] - Office  [phone]

## 2020-10-08 ENCOUNTER — Inpatient Hospital Stay (HOSPITAL_COMMUNITY)
Admit: 2020-10-08 | Discharge: 2020-10-08 | Disposition: A | Discharge status: Home / Self Care | Payer: Medicare Other | Attending: Neurology | Admitting: Neurology

## 2020-10-08 DIAGNOSIS — R443 Hallucinations, unspecified: Secondary | ICD-10-CM | POA: Diagnosis not present

## 2020-10-08 DIAGNOSIS — I6783 Posterior reversible encephalopathy syndrome: Secondary | ICD-10-CM | POA: Diagnosis not present

## 2020-10-08 DIAGNOSIS — G9341 Metabolic encephalopathy: Secondary | ICD-10-CM | POA: Diagnosis not present

## 2020-10-08 LAB — COMPREHENSIVE METABOLIC PANEL
ALT: 15 U/L (ref 0–44)
AST: 17 U/L (ref 15–41)
Albumin: 2.9 g/dL — ABNORMAL LOW (ref 3.5–5.0)
Alkaline Phosphatase: 69 U/L (ref 38–126)
Anion gap: 7 (ref 5–15)
BUN: 15 mg/dL (ref 8–23)
CO2: 20 mmol/L — ABNORMAL LOW (ref 22–32)
Calcium: 9.2 mg/dL (ref 8.9–10.3)
Chloride: 115 mmol/L — ABNORMAL HIGH (ref 98–111)
Creatinine, Ser: 1.52 mg/dL — ABNORMAL HIGH (ref 0.44–1.00)
GFR, Estimated: 35 mL/min — ABNORMAL LOW (ref >60)
Glucose, Bld: 97 mg/dL (ref 70–99)
Potassium: 4.1 mmol/L (ref 3.5–5.1)
Sodium: 142 mmol/L (ref 135–145)
Total Bilirubin: 0.3 mg/dL (ref 0.3–1.2)
Total Protein: 5.7 g/dL — ABNORMAL LOW (ref 6.5–8.1)

## 2020-10-08 LAB — CBC
HCT: 36.5 % (ref 36.0–46.0)
Hemoglobin: 11.4 g/dL — ABNORMAL LOW (ref 12.0–15.0)
MCH: 27.3 pg (ref 26.0–34.0)
MCHC: 31.2 g/dL (ref 30.0–36.0)
MCV: 87.3 fL (ref 80.0–100.0)
Platelets: 191 10*3/uL (ref 150–400)
RBC: 4.18 MIL/uL (ref 3.87–5.11)
RDW: 16.8 % — ABNORMAL HIGH (ref 11.5–15.5)
WBC: 4.9 10*3/uL (ref 4.0–10.5)
nRBC: 0 % (ref 0.0–0.2)

## 2020-10-08 LAB — CYTOLOGY - NON PAP

## 2020-10-08 LAB — ANGIOTENSIN CONVERTING ENZYME, CSF: Angio Convert Enzyme: <1.5 U/L (ref 0.0–3.1)

## 2020-10-08 NOTE — Progress Notes (Signed)
[place] A. Allison Laughter, MD     [email or web address]          Allison Watson is an 80 y.o. female.   ASSESSMENT/PLAN: Terrifying and frightening visual hallucinations likely due to MRI findings especially abnormal signal/  enhancement of the p.m. on tele right parietal occipital region. The differential diagnosis includes chronic inflammatory or infectious etiology and malignancy.   Follow-up the workup including cytology.  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  Chronic headaches which actually are much better with Topamax and improvement in shoulder pain.   It appears she has done well with the neuroleptic for the terrifying hallucinations. She reports that this has improved significantly. She does have a mild frontal headache however. Workup is pending. Turns are that the cytology may not have been actually done. The lab was contacted a couple times and arrangements are being done to make sure the this will be done. She tells me she rested well last night.   GENERAL:   She is resting well.  HEENT:  Neck is supple no trauma noted.  EXTREMITIES: No edema   BACK: Normal  SKIN: Normal by inspection.    MENTAL STATUS: Alert and oriented. Speech, language and cognition are generally intact. Judgment and insight normal.   MOTOR: Normal tone, bulk and strength; no pronator drift.  COORDINATION: Left finger to nose is normal, right finger to nose is normal, No rest tremor; no intention tremor; no postural tremor; no bradykinesia.        Blood pressure (!) 132/42, pulse (!) 59, temperature 98.4 F (36.9 C), resp. rate 16, height '4\' 11"'$  (1.499 m), weight 80.8 kg, SpO2 97 %.  Past Medical History:  Diagnosis Date   Arthritis    Lt arm   Gout    Hypertension     Past Surgical History:   Procedure Laterality Date   ABDOMINAL HYSTERECTOMY     TUBAL LIGATION      History reviewed. No pertinent family history.  Social History:  reports that she has quit smoking. She has never used smokeless tobacco. She reports that she does not drink alcohol and does not use drugs.  Allergies:  Allergies  Allergen Reactions   Codeine Rash   Penicillins Rash    Has patient had a PCN reaction causing immediate rash, facial/tongue/throat swelling, SOB or lightheadedness with hypotension: Yes Has patient had a PCN reaction causing severe rash involving mucus membranes or skin necrosis: No Has patient had a PCN reaction that required hospitalization No Has patient had a PCN reaction occurring within the last 10 years: No If all of the above answers are "NO", then may proceed with Cephalosporin use.     Medications: Prior to Admission medications   Medication Sig Start Date End Date Taking? Authorizing Provider  acetaminophen (TYLENOL) 500 MG tablet Take 500 mg by mouth every 6 (six) hours as needed for mild pain.     [provider]  albuterol (VENTOLIN HFA) 108 (90 Base) MCG/ACT inhaler Inhale 2 puffs into the lungs in the morning, at noon, in the evening, and at bedtime. 09/03/20   [provider]  allopurinol (ZYLOPRIM) 300 MG tablet Take 300 mg by mouth daily. 01/24/19   [provider]  cholecalciferol (VITAMIN D3) 25 MCG (1000 UNIT) tablet Take 1,000 Units by mouth daily.    [provider]  clonazePAM (KLONOPIN) 1 MG  tablet Take 1 mg by mouth at bedtime. 09/07/20   [provider]  diltiazem (CARDIZEM CD) 240 MG 24 hr capsule Take 240 mg by mouth daily. 07/20/20   [provider]  HYDROcodone-acetaminophen (NORCO/VICODIN) 5-325 MG tablet Take 1 every 8 hours if needed for bad headache that is not relieved by Tylenol or Motrin 07/21/19   Milton Ferguson, MD  ibuprofen (ADVIL) 200 MG tablet Take 200 mg by mouth daily.    [provider]  labetalol (NORMODYNE) 100 MG tablet Take 100 mg by mouth 2 (two) times daily. 07/21/19   [provider]  lisinopril-hydrochlorothiazide (PRINZIDE,ZESTORETIC) 20-12.5 MG per tablet Take 1 tablet by mouth daily.    [provider]  LORazepam (ATIVAN) 1 MG tablet Take 1 mg by mouth at bedtime. 01/08/19   [provider]  naproxen (NAPROSYN) 500 MG tablet Take 500 mg by mouth 2 (two) times daily as needed. 09/20/20   [provider]  oxyCODONE (OXY IR/ROXICODONE) 5 MG immediate release tablet Take 5 mg by mouth 4 (four) times daily as needed for moderate pain or severe pain.  05/27/19   [provider]  prochlorperazine (COMPAZINE) 10 MG tablet Take 1 tablet (10 mg total) by mouth every 6 (six) hours as needed for nausea or vomiting (or headache). [id number]   Delora Fuel, MD  QUEtiapine (SEROQUEL) 50 MG tablet Take 50 mg by mouth at bedtime. 09/30/20   [provider]  traZODone (DESYREL) 50 MG tablet Take 50 mg by mouth daily. 09/22/20   [provider]  verapamil (CALAN) 120 MG tablet Take 120 mg by mouth 2 (two) times daily.  08/16/15   [provider]    Scheduled Meds:  labetalol  100 mg Oral BID   OLANZapine  7.5 mg Oral QHS   topiramate  50 mg Oral BID   verapamil  120 mg Oral BID   Continuous Infusions:  sodium chloride 50 mL/hr at 10/07/20 1510   PRN Meds:.acetaminophen **OR** acetaminophen, hydrALAZINE, LORazepam, ondansetron **OR** ondansetron (ZOFRAN) IV     Results for orders placed or performed during the hospital encounter of 10/05/20 (from the past 48 hour(s))  Culture, fungus without smear     Status: None (Preliminary result)   Collection Time: 10/06/20  9:56 AM   Specimen: CSF; Other  Result Value Ref Range   Specimen Description CSF    Special Requests NONE    Culture      NO FUNGUS ISOLATED AFTER 1 DAY Performed at [facility], [place] [address] [place], [place] [address]    Report  Status PENDING   VDRL, CSF     Status: None   Collection Time: 10/06/20  9:56 AM  Result Value Ref Range   VDRL Quant, CSF Non Reactive Non Rea:<1:1    Comment: (NOTE) Performed At: [facility] [address] [place], [place] [id number] Rush Farmer MD [id number]   Cryptococcal antigen, CSF     Status: None   Collection Time: 10/06/20 11:13 AM  Result Value Ref Range   Crypto Ag NEGATIVE NEGATIVE   Cryptococcal Ag Titer NOT INDICATED NOT INDICATED    Comment: Performed at [facility], [address], [place],  [address]  CSF cell count with differential collection tube #: 4     Status: Abnormal   Collection Time: 10/06/20 11:15 AM  Result Value Ref Range   Tube # 1     Comment: CORRECTED ON 06/15 AT 1505: PREVIOUSLY  REPORTED AS 4   Color, CSF COLORLESS COLORLESS   Appearance, CSF CLEAR (A) CLEAR   Supernatant CLEAR    RBC Count, CSF 6 (H) 0 /cu mm   WBC, CSF 2 0 - 5 /cu mm   Segmented Neutrophils-CSF TOO FEW TO COUNT, SMEAR AVAILABLE FOR REVIEW 0 - 6 %   Lymphs, CSF TOO FEW TO COUNT, SMEAR AVAILABLE FOR REVIEW 40 - 80 %   Monocyte-Macrophage-Spinal Fluid TOO FEW TO COUNT, SMEAR AVAILABLE FOR REVIEW 15 - 45 %   Eosinophils, CSF TOO FEW TO COUNT, SMEAR AVAILABLE FOR REVIEW 0 - 1 %   Other Cells, CSF PREDOMINATE CELL SEEN LYMPHOCYTE     Comment: Performed at [facility], [address], [place], [place] [address]  CSF culture w Gram Stain     Status: None (Preliminary result)   Collection Time: 10/06/20 11:15 AM   Specimen: CSF; Cerebrospinal Fluid  Result Value Ref Range   Specimen Description      CSF Performed at [facility], [address], [place], [place] [address]    Special Requests      NONE Performed at [facility], [address], [place], [place] [address]    Gram Stain      NO ORGANISMS SEEN NO WBC SEEN Performed at [facility], [address] [place], [place] [address]    Culture      NO GROWTH < 24 HOURS Performed at [facility], [place] [address] [place], [place] [address]    Report Status PENDING   Protein and glucose, CSF     Status: None   Collection Time: 10/06/20 11:15 AM  Result Value Ref Range   Glucose, CSF 69 40 - 70 mg/dL   Total  Protein, CSF 39 15 - 45 mg/dL    Comment: Performed at [facility], [address] [place], [place] [address]  CSF cell count with differential collection tube #: 1     Status: Abnormal   Collection Time: 10/06/20 11:15 AM  Result Value Ref Range   Tube # 4     Comment: CORRECTED ON 06/15 AT 1505: PREVIOUSLY REPORTED AS 1   Color, CSF COLORLESS COLORLESS   Appearance, CSF CLEAR (A) CLEAR   Supernatant CLEAR    RBC Count, CSF 4 (H) 0 /cu mm   WBC, CSF 2 0 - 5 /cu mm   Segmented Neutrophils-CSF TOO FEW TO COUNT, SMEAR AVAILABLE FOR REVIEW 0 - 6 %   Lymphs, CSF TOO FEW TO COUNT, SMEAR AVAILABLE FOR REVIEW 40 - 80 %   Monocyte-Macrophage-Spinal Fluid TOO FEW TO COUNT, SMEAR AVAILABLE FOR REVIEW 15 - 45 %   Eosinophils, CSF TOO FEW TO COUNT, SMEAR AVAILABLE FOR REVIEW 0 - 1 %    Comment: Performed at [facility], [address], [place], [place] [address]  Urine rapid drug screen (hosp performed)     Status: None   Collection Time: 10/07/20  4:25 PM  Result Value Ref Range   Opiates NONE DETECTED NONE DETECTED   Cocaine NONE DETECTED NONE DETECTED   Benzodiazepines NONE DETECTED NONE DETECTED   Amphetamines NONE DETECTED NONE DETECTED   Tetrahydrocannabinol NONE DETECTED NONE DETECTED   Barbiturates NONE DETECTED NONE DETECTED    Comment: (NOTE) DRUG SCREEN FOR MEDICAL PURPOSES ONLY.  IF CONFIRMATION IS NEEDED FOR ANY PURPOSE, NOTIFY LAB WITHIN 5 DAYS.  LOWEST DETECTABLE LIMITS FOR URINE DRUG SCREEN Drug Class  Cutoff (ng/mL) Amphetamine and metabolites    1000 Barbiturate and metabolites    200 Benzodiazepine                 [id number] Tricyclics and metabolites     300 Opiates and metabolites        300 Cocaine and metabolites         300 THC                            50 Performed at [facility], [address] [place], [place] [address]   CBC     Status: Abnormal   Collection Time: 10/08/20  6:35 AM  Result Value Ref Range   WBC 4.9 4.0 - 10.5 K/uL   RBC 4.18 3.87 - 5.11 MIL/uL   Hemoglobin 11.4 (L) 12.0 - 15.0 g/dL   HCT 36.5 36.0 - 46.0 %   MCV 87.3 80.0 - 100.0 fL   MCH 27.3 26.0 - 34.0 pg   MCHC 31.2 30.0 - 36.0 g/dL   RDW 16.8 (H) 11.5 - 15.5 %   Platelets 191 150 - 400 K/uL   nRBC 0.0 0.0 - 0.2 %    Comment: Performed at [facility], [address], [place], [place] [address]  Comprehensive metabolic panel     Status: Abnormal   Collection Time: 10/08/20  6:35 AM  Result Value Ref Range   Sodium 142 135 - 145 mmol/L   Potassium 4.1 3.5 - 5.1 mmol/L   Chloride 115 (H) 98 - 111 mmol/L   CO2 20 (L) 22 - 32 mmol/L   Glucose, Bld 97 70 - 99 mg/dL    Comment: Glucose reference range applies only to samples taken after fasting for at least 8 hours.   BUN 15 8 - 23 mg/dL   Creatinine, Ser 1.52 (H) 0.44 - 1.00 mg/dL   Calcium 9.2 8.9 - 10.3 mg/dL   Total Protein 5.7 (L) 6.5 - 8.1 g/dL   Albumin 2.9 (L) 3.5 - 5.0 g/dL   AST 17 15 - 41 U/L   ALT 15 0 - 44 U/L   Alkaline Phosphatase 69 38 - 126 U/L   Total Bilirubin 0.3 0.3 - 1.2 mg/dL   GFR, Estimated 35 (L) >60 mL/min    Comment: (NOTE) Calculated using the CKD-EPI Creatinine Equation (2021)    Anion gap 7 5 - 15    Comment: Performed at [facility], [address], [place], [place] [address]    Studies/Results:   BRAIN MRI FINDINGS: Brain: Abnormal sulcal FLAIR hyperintensity and curvilinear pial enhancement involving the posterior right cerebrum, including the parietal lobe, occipital lobe and posterior right temporal lobe. No restricted diffusion to suggest acute infarct. No subjacent encephalomalacia or associated susceptibility artifact. Mild atrophy with ex vacuo ventricular dilation. No hydrocephalus. Moderate scattered T2/FLAIR  hyperintensities within the white matter, which are nonspecific but most likely related to chronic microvascular ischemic disease. No sizable extra-axial fluid collection. No mass lesion or midline shift.   Vascular: Major arterial flow voids are maintained skull base.   Skull and upper cervical spine: Normal marrow signal.   Sinuses/Orbits: Left maxillary sinus retention cyst. Unremarkable orbits.   Other: No mastoid effusions.   IMPRESSION: 1. Abnormal focal sulcal FLAIR hyperintensity and pial enhancement involving the posterior right cerebrum, including the right parietal, occipital and posterior temporal lobes. Findings are nonspecific with differential considerations including meningitis/encephalitis, postictal hyperemia, neurosarcoidosis, vasculitis, and leptomeningeal carcinomatosis. Recommend  neurology consultation and correlation with lumbar puncture. 2. Moderate chronic microvascular ischemic disease.      The brain MRI scan is reviewed in person. There is marked deep white matter increased signal most noted in a confluent fashion posteriorly around the posterior horn of the lateral ventricle. There is signal abnormality bright post early involving the p.m. on the right parietal area which some mild enhancement noted.  Allison Watson A. Allison Watson, M.D.  Diplomate, Tax adviser of Psychiatry and Neurology ( Neurology). 10/08/2020, 7:51 AM

## 2020-10-08 NOTE — Care Management Important Message (Addendum)
Important Message  Patient Details  Name: Allison Watson MRN: [id number] Date of Birth: 01-27-1941   Medicare Important Message Given:  Yes RN will deliver due to contact precautions    Tommy Medal 10/08/2020, 12:54 PM

## 2020-10-08 NOTE — Progress Notes (Signed)
EEG Completed; Results Pending  

## 2020-10-08 NOTE — Procedures (Signed)
  [place] A. Merlene Laughter, MD     [email or web address]           HISTORY: 80 year old who presents with altered mental status.  The studies been done to evaluate for seizures.  MEDICATIONS:  Current Facility-Administered Medications:    0.9 %  sodium chloride infusion, , Intravenous, Continuous, Emokpae, Courage, MD, Last Rate: 50 mL/hr at 10/08/20 1808, New Bag at 10/08/20 1808   acetaminophen (TYLENOL) tablet 650 mg, 650 mg, Oral, Q6H PRN **OR** acetaminophen (TYLENOL) suppository 650 mg, 650 mg, Rectal, Q6H PRN, Zierle-Ghosh, Asia B, DO   hydrALAZINE (APRESOLINE) injection 10 mg, 10 mg, Intravenous, Q6H PRN, Emokpae, Courage, MD   labetalol (NORMODYNE) tablet 100 mg, 100 mg, Oral, BID, Zierle-Ghosh, Asia B, DO, 100 mg at 10/08/20 1031   LORazepam (ATIVAN) tablet 0.5 mg, 0.5 mg, Oral, QHS PRN, Zierle-Ghosh, Asia B, DO   OLANZapine (ZYPREXA) tablet 7.5 mg, 7.5 mg, Oral, QHS, Tiziana Cislo, MD, 7.5 mg at 10/07/20 2204   ondansetron (ZOFRAN) tablet 4 mg, 4 mg, Oral, Q6H PRN **OR** ondansetron (ZOFRAN) injection 4 mg, 4 mg, Intravenous, Q6H PRN, Zierle-Ghosh, Asia B, DO   topiramate (TOPAMAX) tablet 50 mg, 50 mg, Oral, BID, Emokpae, Courage, MD, 50 mg at 10/08/20 1032   verapamil (CALAN) tablet 120 mg, 120 mg, Oral, BID, Zierle-Ghosh, Asia B, DO, 120 mg at 10/08/20 1032     ANALYSIS: A 16 channel recording using standard 10 20 measurements is conducted for 23 minutes.  The background rhythm is that of 7 to 7.5 Hz.  There is beta activity observed in frontal areas.  Awake and sleep architecture is noted.  Sleep spindles and rudimentary K complexes are noted.  Vertex sharp waves are also noted.  Photic stimulation and hyperventilation are not conducted.  There is no focal or lateral slowing.  There is no epileptiform activity is noted.   IMPRESSION: 1.  This recording of the awake and sleep state shows mild global slowing indicating a mild global encephalopathy.   Otherwise, no epileptiform discharges are noted.      Merrit Friesen A. Merlene Laughter, M.D.  Diplomate, Tax adviser of Psychiatry and Neurology ( Neurology).

## 2020-10-08 NOTE — Progress Notes (Signed)
Patient Demographics:    Allison Watson, is a 80 y.o. female, DOB - [birth year], [id number]  Admit date - 10/05/2020   Admitting Physician Hamad Whyte Denton Brick, MD  Outpatient Primary MD for the patient is Lucia Gaskins, MD  LOS - 1   Chief Complaint  Patient presents with   Altered Mental Status        Subjective:    Allison Watson today has no fevers, no emesis,  No chest pain,   -Headache and hallucinations and not worse  Assessment  & Plan :    Principal Problem:   Acute metabolic encephalopathy Active Problems:   Essential hypertension   Psychosis ([place])  Brief Summary:- 80 y.o. female,With a past medical history of insomnia, chronic pain, hypertension, gout, arthritis admitted on 10/06/20 with confusion/psychosis and acute metabolic encephalopathy--with abnormal MRI brain findings  A/p 1) acute metabolic encephalopathy/psychosis -Abnormal brain MRI findings raise concerns about possible meningitis/encephalitis Vs postictal hyperemia Vs neurosarcoidosis, Vs vasculitis Vs leptomeningeal carcinomatosis -S/p LP on 10/06/20--- CSF Fluid studies from 10/06/2020 are re-assuring from infection standpoint, oligoclonal bands pending --Blood and CSF cultures are pending CSF Cryptococcal antigen is Neg CSF angiotensin-converting enzymes WNL -CSF cytology without malignancy- -Neurology consult appreciated -Headaches and hallucinations persist -TSH WNL -Zyprexa 7.5 mg every afternoon added by neurologist -EEG pending   2)HTN-- Hold Lisinopril/HCTZ due to AKI -c/n Verapamil  120 mg bid and Labetalol 100 mg bid  may use IV Hydralazine 10 mg  Every 4 hours Prn for systolic blood pressure over 170 mmhg   3)AKI----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   --creatinine on admission= 2.0 , baseline creatinine =  1.4 to 1.5  , creatinine is now= 1.52,  ---renally adjust medications, avoid nephrotoxic  agents / dehydration  / hypotension -Continue IV fluids, encourage increased oral intake,  ---Lisinopril/HCTZ has been discontinued  4) chronic headaches-- c/n Topamax and verapamil for headache prevention  Disposition/Need for in-Hospital Stay- patient unable to be discharged at this time due to -abnormal neuro findings awaiting further CSF studies, AKI requiring IV fluids  Status is: Inpatient  Remains inpatient appropriate because: Please see disposition above  Disposition: The patient is from: Home              Anticipated d/c is to: Home              Anticipated d/c date is: 1 day              Patient currently is not medically stable to d/c. Barriers: Not Clinically Stable-   Code Status :  -  Code Status: Full Code   Family Communication:    NA (patient is alert, awake and coherent)   Consults  :  Neuro  DVT Prophylaxis  :   - SCDs  SCDs Start: 10/06/20 0423   Lab Results  Component Value Date   PLT 191 10/08/2020    Inpatient Medications  Scheduled Meds:  labetalol  100 mg Oral BID   OLANZapine  7.5 mg Oral QHS   topiramate  50 mg Oral BID   verapamil  120 mg Oral BID   Continuous Infusions:  sodium chloride 50 mL/hr at 10/08/20 1808   PRN Meds:.acetaminophen **OR** acetaminophen, hydrALAZINE, LORazepam, ondansetron **OR** ondansetron (  ZOFRAN) IV    Anti-infectives (From admission, onward)    None         Objective:   Vitals:   10/07/20 1333 10/07/20 2202 10/08/20 0521 10/08/20 1316  BP: 113/77 (!) 181/67 (!) 132/42 (!) 119/43  Pulse: 62 65 (!) 59 63  Resp:  '16 16 17  '$ Temp:  98.7 F (37.1 C) 98.4 F (36.9 C) 98.8 F (37.1 C)  TempSrc:  Oral  Oral  SpO2: 97% 100% 97% 99%  Weight:      Height:        Wt Readings from Last 3 Encounters:  10/06/20 80.8 kg  10/05/20 72.6 kg  07/21/19 70.8 kg     Intake/Output Summary (Last 24 hours) at 10/08/2020 1823 Last data filed at 10/08/2020 1300 Gross per 24 hour  Intake 876.99 ml  Output --   Net 876.99 ml     Physical Exam  Gen:- Awake Alert,  in no apparent distress  HEENT:- [place].AT, No sclera icterus Neck-Supple Neck,No JVD,.  Lungs-  CTAB , fair symmetrical air movement CV- S1, S2 normal, regular  Abd-  +ve B.Sounds, Abd Soft, No tenderness,    Extremity/Skin:- No  edema, pedal pulses present  Psych-affect is appropriate, oriented x3, episodes of hallucinations and psychosis are less frequent Neuro-generalized weakness, no new focal deficits, no tremors   Data Review:   Micro Results Recent Results (from the past 240 hour(s))  Resp Panel by RT-PCR (Flu A&B, Covid) Nasopharyngeal Swab     Status: None   Collection Time: 10/05/20  9:33 PM   Specimen: Nasopharyngeal Swab; Nasopharyngeal(NP) swabs in vial transport medium  Result Value Ref Range Status   SARS Coronavirus 2 by RT PCR NEGATIVE NEGATIVE Final    Comment: (NOTE) SARS-CoV-2 target nucleic acids are NOT DETECTED.  The SARS-CoV-2 RNA is generally detectable in upper respiratory specimens during the acute phase of infection. The lowest concentration of SARS-CoV-2 viral copies this assay can detect is 138 copies/mL. A negative result does not preclude SARS-Cov-2 infection and should not be used as the sole basis for treatment or other patient management decisions. A negative result may occur with  improper specimen collection/handling, submission of specimen other than nasopharyngeal swab, presence of viral mutation(s) within the areas targeted by this assay, and inadequate number of viral copies(<138 copies/mL). A negative result must be combined with clinical observations, patient history, and epidemiological information. The expected result is Negative.  Fact Sheet for Patients:  EntrepreneurPulse.com.au  Fact Sheet for Healthcare Providers:  IncredibleEmployment.be  This test is no t yet approved or cleared by the [place] FDA and  has been authorized for  detection and/or diagnosis of SARS-CoV-2 by FDA under an Emergency Use Authorization (EUA). This EUA will remain  in effect (meaning this test can be used) for the duration of the COVID-19 declaration under Section 564(b)(1) of the Act, 21 U.S.C.section 360bbb-3(b)(1), unless the authorization is terminated  or revoked sooner.       Influenza A by PCR NEGATIVE NEGATIVE Final   Influenza B by PCR NEGATIVE NEGATIVE Final    Comment: (NOTE) The Xpert Xpress SARS-CoV-2/FLU/RSV plus assay is intended as an aid in the diagnosis of influenza from Nasopharyngeal swab specimens and should not be used as a sole basis for treatment. Nasal washings and aspirates are unacceptable for Xpert Xpress SARS-CoV-2/FLU/RSV testing.  Fact Sheet for Patients: EntrepreneurPulse.com.au  Fact Sheet for Healthcare Providers: IncredibleEmployment.be  This test is not yet approved or cleared by the [place]  States FDA and has been authorized for detection and/or diagnosis of SARS-CoV-2 by FDA under an Emergency Use Authorization (EUA). This EUA will remain in effect (meaning this test can be used) for the duration of the COVID-19 declaration under Section 564(b)(1) of the Act, 21 U.S.C. section 360bbb-3(b)(1), unless the authorization is terminated or revoked.  Performed at [facility], [address] [place], [place] [address]   Culture, blood (routine x 2)     Status: None (Preliminary result)   Collection Time: 10/06/20 12:18 AM   Specimen: BLOOD RIGHT HAND  Result Value Ref Range Status   Specimen Description BLOOD RIGHT HAND  Final   Special Requests   Final    BOTTLES DRAWN AEROBIC AND ANAEROBIC Blood Culture adequate volume   Culture   Final    NO GROWTH 2 DAYS Performed at [facility], [address], [place], [place] [address]    Report Status PENDING  Incomplete  Culture, blood (routine x 2)     Status: None (Preliminary result)   Collection Time:  10/06/20 12:25 AM   Specimen: BLOOD LEFT HAND  Result Value Ref Range Status   Specimen Description BLOOD LEFT HAND  Final   Special Requests AEROBIC BOTTLE ONLY Blood Culture adequate volume  Final   Culture   Final    NO GROWTH 2 DAYS Performed at [facility], [address] [place], [place] [address]    Report Status PENDING  Incomplete  Culture, fungus without smear     Status: None (Preliminary result)   Collection Time: 10/06/20  9:56 AM   Specimen: CSF; Other  Result Value Ref Range Status   Specimen Description CSF  Final   Special Requests NONE  Final   Culture   Final    NO FUNGUS ISOLATED AFTER 2 DAYS Performed at [facility], [address], [place], [place] [address]    Report Status PENDING  Incomplete  CSF culture w Gram Stain     Status: None (Preliminary result)   Collection Time: 10/06/20 11:15 AM   Specimen: CSF; Cerebrospinal Fluid  Result Value Ref Range Status   Specimen Description   Final    CSF Performed at [facility], [address] [place], [place] [address]    Special Requests   Final    NONE Performed at [facility], [address], [place], [place] [address]    Gram Stain   Final    NO ORGANISMS SEEN NO WBC SEEN Performed at [facility], [address], [place], [place] [address]    Culture   Final    NO GROWTH 2 DAYS Performed at [facility], [address], [place], [place] [address]    Report Status PENDING  Incomplete    Radiology Reports MR Brain W and Wo Contrast  Result Date: 10/05/2020 CLINICAL DATA:  Psychosis.  Vision changes. EXAM: MRI HEAD WITHOUT AND WITH CONTRAST TECHNIQUE: Multiplanar, multiecho pulse sequences of the brain and surrounding structures were obtained without and with intravenous contrast. CONTRAST:  30m GADAVIST GADOBUTROL 1 MMOL/ML IV SOLN midline shift. COMPARISON:  CT head March 2021. FINDINGS: Brain: Abnormal sulcal FLAIR hyperintensity and curvilinear pial enhancement involving the  posterior right cerebrum, including the parietal lobe, occipital lobe and posterior right temporal lobe. No restricted diffusion to suggest acute infarct. No subjacent encephalomalacia or associated susceptibility artifact. Mild atrophy with ex vacuo ventricular dilation. No hydrocephalus. Moderate scattered T2/FLAIR hyperintensities within the white matter, which are nonspecific but most  likely related to chronic microvascular ischemic disease. No sizable extra-axial fluid collection. No mass lesion or midline shift. Vascular: Major arterial flow voids are maintained skull base. Skull and upper cervical spine: Normal marrow signal. Sinuses/Orbits: Left maxillary sinus retention cyst. Unremarkable orbits. Other: No mastoid effusions. IMPRESSION: 1. Abnormal focal sulcal FLAIR hyperintensity and pial enhancement involving the posterior right cerebrum, including the right parietal, occipital and posterior temporal lobes. Findings are nonspecific with differential considerations including meningitis/encephalitis, postictal hyperemia, neurosarcoidosis, vasculitis, and leptomeningeal carcinomatosis. Recommend neurology consultation and correlation with lumbar puncture. 2. Moderate chronic microvascular ischemic disease. Findings discussed with Dr. Alvino Chapel via telephone at 4:45 PM. Electronically Signed   By: Margaretha Sheffield MD   On: 10/05/2020 16:54   DG FL GUIDED LUMBAR PUNCTURE  Result Date: 10/06/2020 CLINICAL DATA:  Hallucinations, confusion, history hypertension EXAM: DIAGNOSTIC LUMBAR PUNCTURE UNDER FLUOROSCOPIC GUIDANCE COMPARISON:  MR brain 10/05/2020 FLUOROSCOPY TIME:  Fluoroscopy Time:  0 minutes 6 seconds Radiation Exposure Index (if provided by the fluoroscopic device): 9.3 Number of Acquired Spot Images: 1 PROCEDURE: Procedure, benefits, and risks were discussed with the patient, including alternatives. Patient's questions were answered. Written informed consent was obtained. Timeout protocol  followed. Patient placed prone. L3-L4 disc space was localized under fluoroscopy. Skin prepped and draped in usual sterile fashion. Skin and soft tissues anesthetized with 5 mL of 1% lidocaine. 22 gauge needle was advanced into the spinal canal under oblique approach where clear colorless CSF was encountered with an opening pressure of 3 cm H2O (prone). 10 mL of CSF was obtained in 4 tubes for requested analysis. Procedure tolerated very well by patient without immediate complication. IMPRESSION: Fluoroscopic guided lumbar puncture as above. Electronically Signed   By: Lavonia Dana M.D.   On: 10/06/2020 13:07     CBC Recent Labs  Lab 10/05/20 2228 10/06/20 0018 10/06/20 0507 10/08/20 0635  WBC 5.8 5.6 6.0 4.9  HGB 12.3 12.4 12.2 11.4*  HCT 38.3 38.8 39.2 36.5  PLT 206 214 193 191  MCV 85.9 85.8 87.9 87.3  MCH 27.6 27.4 27.4 27.3  MCHC 32.1 32.0 31.1 31.2  RDW 16.5* 16.5* 16.5* 16.8*  LYMPHSABS 1.5 1.7 1.8  --   MONOABS 0.6 0.6 0.8  --   EOSABS 0.3 0.3 0.3  --   BASOSABS 0.0 0.0 0.1  --     Chemistries  Recent Labs  Lab 10/05/20 1546 10/06/20 0018 10/06/20 0507 10/08/20 0635  NA 139 139 139 142  K 4.1 3.3* 3.7 4.1  CL 109 107 107 115*  CO2 20* 23 26 20*  GLUCOSE 101* 100* 99 97  BUN 24* 25* 24* 15  CREATININE 2.00* 1.99* 1.83* 1.52*  CALCIUM 9.4 9.4 9.2 9.2  MG  --   --  1.7  --   AST  --  '17 15 17  '$ ALT  --  '18 15 15  '$ ALKPHOS  --  72 65 69  BILITOT  --  0.7 0.4 0.3   ------------------------------------------------------------------------------------------------------------------ No results for input(s): CHOL, HDL, LDLCALC, TRIG, CHOLHDL, LDLDIRECT in the last 72 hours.  No results found for: HGBA1C ------------------------------------------------------------------------------------------------------------------ Recent Labs    10/06/20 0507  TSH 1.633    ------------------------------------------------------------------------------------------------------------------ No results for input(s): VITAMINB12, FOLATE, FERRITIN, TIBC, IRON, RETICCTPCT in the last 72 hours.  Coagulation profile No results for input(s): INR, PROTIME in the last 168 hours.  No results for input(s): DDIMER in the last 72 hours.  Cardiac Enzymes No results for input(s): CKMB, TROPONINI, MYOGLOBIN in the  last 168 hours.  Invalid input(s): CK ------------------------------------------------------------------------------------------------------------------ No results found for: BNP   Roxan Hockey M.D on 10/08/2020 at 6:23 PM  Go to [email or web address] - for contact info  [facility] - Office  [phone]

## 2020-10-09 DIAGNOSIS — G9341 Metabolic encephalopathy: Secondary | ICD-10-CM | POA: Diagnosis not present

## 2020-10-09 DIAGNOSIS — I6783 Posterior reversible encephalopathy syndrome: Secondary | ICD-10-CM | POA: Diagnosis not present

## 2020-10-09 DIAGNOSIS — R443 Hallucinations, unspecified: Secondary | ICD-10-CM | POA: Diagnosis not present

## 2020-10-09 LAB — BASIC METABOLIC PANEL
Anion gap: 3 — ABNORMAL LOW (ref 5–15)
BUN: 12 mg/dL (ref 8–23)
CO2: 25 mmol/L (ref 22–32)
Calcium: 9.5 mg/dL (ref 8.9–10.3)
Chloride: 115 mmol/L — ABNORMAL HIGH (ref 98–111)
Creatinine, Ser: 1.56 mg/dL — ABNORMAL HIGH (ref 0.44–1.00)
GFR, Estimated: 34 mL/min — ABNORMAL LOW (ref >60)
Glucose, Bld: 95 mg/dL (ref 70–99)
Potassium: 4 mmol/L (ref 3.5–5.1)
Sodium: 143 mmol/L (ref 135–145)

## 2020-10-09 MED ORDER — LABETALOL HCL 100 MG PO TABS: 100.0000 mg | ORAL_TABLET | Freq: Two times a day (BID) | ORAL | 5 refills | Status: DC

## 2020-10-09 MED ORDER — TOPIRAMATE 100 MG PO TABS: 100.0000 mg | ORAL_TABLET | Freq: Two times a day (BID) | ORAL | 3 refills | Status: DC

## 2020-10-09 MED ORDER — HYDRALAZINE HCL 100 MG PO TABS: 100.0000 mg | ORAL_TABLET | Freq: Two times a day (BID) | ORAL | 5 refills | Status: DC

## 2020-10-09 MED ORDER — OLANZAPINE 10 MG PO TABS: 10.0000 mg | ORAL_TABLET | Freq: Every day | ORAL | 4 refills | Status: DC

## 2020-10-09 MED ORDER — LORAZEPAM 1 MG PO TABS: 1.0000 mg | ORAL_TABLET | Freq: Every day | ORAL | 0 refills | Status: DC

## 2020-10-09 NOTE — Discharge Instructions (Signed)
1)Follow up with Psychiatrist Dr. Levonne Spiller in about 1 week--in [facility] 2)Please follow-up with Neurologist Dr. Phillips Odor-- Phone: [phone], Address: [address], [place], [place] [address] in 4 to 6 weeks for recheck and reevaluation.  Please call to make appointment with him 3) please note that there has been changes to your medications

## 2020-10-09 NOTE — Discharge Summary (Signed)
Allison Watson, is a 80 y.o. female  DOB [birth year]  MRN [id number].  Admission date:  10/05/2020  Admitting Physician  Ann-Marie Kluge Denton Brick, MD  Discharge Date:  10/09/2020   Primary MD  Lucia Gaskins, MD  Recommendations for primary care physician for things to follow:   1)Follow up with Psychiatrist Dr. Levonne Spiller in about 1 week--in [facility] 2)Please follow-up with Neurologist Dr. Phillips Odor-- Phone: [phone], Address: [address], [place], [place] [address] in 4 to 6 weeks for recheck and reevaluation.  Please call to make appointment with him 3) please note that there has been changes to your medications   Admission Diagnosis  Hallucinations [id number] Acute metabolic encephalopathy [id number]   Discharge Diagnosis  Hallucinations [id number] Acute metabolic encephalopathy [id number]    Principal Problem:   Acute metabolic encephalopathy Active Problems:   Essential hypertension   Psychosis ([place])      Past Medical History:  Diagnosis Date   Arthritis    Lt arm   Gout    Hypertension     Past Surgical History:  Procedure Laterality Date   ABDOMINAL HYSTERECTOMY     TUBAL LIGATION       HPI  from the history and physical done on the day of admission:     Allison Watson  is a 80 y.o. female,With a past medical history of insomnia, chronic pain, hypertension, gout, arthritis presents to the ED with a chief complaint of hallucinations.  Patient reports that she is seeing shadow people come through the walls, out of other peoples homes and into her home, she seemed to be worms in the kitchen, she is seeing water dripping from the ceilings, she is seeing people come out of pictures, she is seeing babies in the grass, she is seeing dogs, bite of the ground.  This is been going on off and on since October.  In October it was determined that a new medication she was  started on was causing the hallucinations.  The medication was discontinued and the hallucinations went away for couple of months.  She then went to her PCP for insomnia, and was started on a new sleeping medication.  She reports she does not know the name of that medication either.  The hallucinations came back and within a week she quit the sleeping medication.  The hallucinations have remained since then.  She reports that there 24/7 at this point.  She reports she is not sleeping at night because people are watching her and making mean faces at her.  These people make threatening gestures but she does not feel like they can hurt her.  Patient reports that she feels feverish, but does not explain why.  Daughter at bedside keeps trying to tell me what the patient is trying to say, but the patient goes off on tangents so she is not able to finish what she is saying.  When the daughter tries to explain what patient saying, patient cuts her off and says is not  what she is saying.  She is alert and oriented x3, but there is an obvious component of confusion with any amount of real conversation.  Plan was to do LP in the ER.  Patient initially left AMA because she was too anxious about having LP done.  She then came back to the ER was ready to be admitted.  ED provider reports that he spoke with IR and neuro and everybody is okay with her waiting to get the LP until morning.  Neuro did not feel the need to cover with antibiotics tonight per ED provider report.   Patient does report that she saw her PCP on the day prior to admission, and was advised to stop oxycodone.  She reports she has not taken oxycodone at least a month anyways.     Patient does not smoke, does not drink, and is vaccinated for COVID.  Patient is full code.   In the ED Temp 98, heart rate 78-88, respiratory rate 17-22, blood pressure 156/83, satting at 98% No leukocytosis, hemoglobin 12.3, chemistry panel reveals a AKI with a creatinine of  2.0, up from 1.4 Respiratory panel is negative MRI brain shows a hyperintensity and pial enhancement in the posterior right cerebrum including the parietal, occipital, posterior temporal lobes.  They remarked that the differential is wide including meningitis, encephalitis, post ictal hyperemia, neurosarcoid, and vasculitis.  Since patient has not had a seizure postictal hyperemia can be ruled out.  Since it is been going on several months the patient is not septic most likely meningitis cannot be ruled out as well. Patient does report that she was recently started on an antibiotic for UTI, check UA and urine culture       Hospital Course:    Brief Summary:- 80 y.o. female,With a past medical history of insomnia, chronic pain, hypertension, gout, arthritis admitted on 10/06/20 with confusion/psychosis and acute metabolic encephalopathy--with abnormal MRI brain findings   A/p 1) acute metabolic encephalopathy/psychosis -Abnormal brain MRI findings raise concerns about possible meningitis/encephalitis Vs postictal hyperemia Vs neurosarcoidosis, Vs vasculitis Vs leptomeningeal carcinomatosis -S/p LP on 10/06/20--- CSF Fluid studies from 10/06/2020 are re-assuring from infection standpoint, oligoclonal bands pending --Blood and CSF cultures are NGTD CSF Cryptococcal antigen is Neg CSF angiotensin-converting enzymes WNL -CSF cytology without malignancy- -Neurology consult appreciated -Headaches and hallucinations improving on current regimen -TSH WNL --Okay to discharge on Zyprexa 10 mg at night.  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 Schedule an appointment as soon as possible for a visit in 1 week(s).   Specialty:  Behavioral Health Why: Psychosis/Hallicinations Contact information: [address] [place] [address] [phone]         Phillips Odor, MD Follow up in 1 month(s).   Specialty: Neurology Contact information: [address] [place] [address] [phone]                  Consults obtained -neurology  Diet and Activity recommendation:  As advised  Discharge Instructions    Discharge Instructions     Call MD for:  difficulty breathing, headache or visual disturbances   Complete by: As directed    Call MD for:  persistant dizziness or light-headedness   Complete by: As directed    Call MD for:  persistant nausea and vomiting   Complete by: As directed    Call MD for:  severe uncontrolled pain   Complete by: As directed    Call MD for:  temperature >100.4   Complete by: As directed    Diet - low sodium heart healthy   Complete by: As directed    Discharge instructions   Complete by: As directed    1)Follow up with Psychiatrist Dr. Levonne Spiller in about 1 week--in [facility] 2)Please follow-up with Neurologist Dr. Phillips Odor-- Phone: [phone], Address: [address], [place], [place] [address] in 4 to 6 weeks for recheck and reevaluation.  Please call to make appointment with him 3) please note that there has been changes to your medications   Increase activity slowly   Complete by: As directed        Discharge Medications     Allergies as of 10/09/2020        Reactions   Codeine Rash   Penicillins Rash   Has patient had a PCN reaction causing immediate rash, facial/tongue/throat swelling, SOB or lightheadedness with hypotension: Yes Has patient had a PCN reaction causing severe rash involving mucus membranes or skin necrosis: No Has patient had a PCN reaction that required hospitalization No Has patient had a PCN reaction occurring within the last 10 years: No If all of the above answers are "NO", then may proceed with Cephalosporin use.        Medication List     STOP taking these medications    clonazePAM 1 MG tablet Commonly known as: KLONOPIN   HYDROcodone-acetaminophen 5-325 MG tablet Commonly known as: NORCO/VICODIN   lisinopril-hydrochlorothiazide 20-12.5 MG tablet Commonly known as: ZESTORETIC   naproxen 500 MG tablet Commonly known as: NAPROSYN   prochlorperazine 10 MG tablet Commonly known as: COMPAZINE   traZODone 50 MG tablet Commonly known as: DESYREL   verapamil 120 MG tablet Commonly known as: CALAN       TAKE these medications    acetaminophen 500 MG tablet Commonly known as: TYLENOL Take 500 mg by mouth every 6 (six) hours as needed for mild pain.   cholecalciferol 25 MCG (1000 UNIT) tablet Commonly known as: VITAMIN D3 Take 1,000 Units by mouth daily.   diltiazem 240 MG 24 hr capsule Commonly known as: CARDIZEM CD Take 240 mg by mouth daily.   hydrALAZINE 100 MG tablet Commonly known as: APRESOLINE Take 1 tablet (100 mg total) by mouth 2 (two) times daily.   labetalol 100 MG tablet Commonly known as: NORMODYNE Take 1 tablet (100 mg total) by mouth 2 (two) times daily.   LORazepam 1 MG tablet Commonly known as: ATIVAN Take 1 tablet (1 mg total) by mouth at bedtime.   OLANZapine 10 MG  tablet Commonly known as: ZYPREXA Take 1 tablet (10 mg total) by mouth at bedtime.   Oxycodone HCl 10 MG Tabs Take 10 mg by mouth 2 (two) times daily as needed (pain).   topiramate 100 MG  tablet Commonly known as: TOPAMAX Take 1 tablet (100 mg total) by mouth 2 (two) times daily.        Major procedures and Radiology Reports - PLEASE review detailed and final reports for all details, in brief -    MR Brain W and Wo Contrast  Result Date: 10/05/2020 CLINICAL DATA:  Psychosis.  Vision changes. EXAM: MRI HEAD WITHOUT AND WITH CONTRAST TECHNIQUE: Multiplanar, multiecho pulse sequences of the brain and surrounding structures were obtained without and with intravenous contrast. CONTRAST:  73m GADAVIST GADOBUTROL 1 MMOL/ML IV SOLN midline shift. COMPARISON:  CT head March 2021. FINDINGS: Brain: Abnormal sulcal FLAIR hyperintensity and curvilinear pial enhancement involving the posterior right cerebrum, including the parietal lobe, occipital lobe and posterior right temporal lobe. No restricted diffusion to suggest acute infarct. No subjacent encephalomalacia or associated susceptibility artifact. Mild atrophy with ex vacuo ventricular dilation. No hydrocephalus. Moderate scattered T2/FLAIR hyperintensities within the white matter, which are nonspecific but most likely related to chronic microvascular ischemic disease. No sizable extra-axial fluid collection. No mass lesion or midline shift. Vascular: Major arterial flow voids are maintained skull base. Skull and upper cervical spine: Normal marrow signal. Sinuses/Orbits: Left maxillary sinus retention cyst. Unremarkable orbits. Other: No mastoid effusions. IMPRESSION: 1. Abnormal focal sulcal FLAIR hyperintensity and pial enhancement involving the posterior right cerebrum, including the right parietal, occipital and posterior temporal lobes. Findings are nonspecific with differential considerations including meningitis/encephalitis, postictal hyperemia, neurosarcoidosis, vasculitis, and leptomeningeal carcinomatosis. Recommend neurology consultation and correlation with lumbar puncture. 2. Moderate chronic microvascular ischemic disease.  Findings discussed with Dr. PAlvino Chapelvia telephone at 4:45 PM. Electronically Signed   By: FMargaretha SheffieldMD   On: 10/05/2020 16:54   EEG adult  Result Date: 10/08/2020 DPhillips Odor MD     10/08/2020  8:13 PM HDelawareA. DMerlene Laughter MD     [email or web address]       HISTORY: 80year old who presents with altered mental status.  The studies been done to evaluate for seizures. MEDICATIONS: Current Facility-Administered Medications:   0.9 %  sodium chloride infusion, , Intravenous, Continuous, Azion Centrella, MD, Last Rate: 50 mL/hr at 10/08/20 1808, New Bag at 10/08/20 1808   acetaminophen (TYLENOL) tablet 650 mg, 650 mg, Oral, Q6H PRN **OR** acetaminophen (TYLENOL) suppository 650 mg, 650 mg, Rectal, Q6H PRN, Zierle-Ghosh, Asia B, DO   hydrALAZINE (APRESOLINE) injection 10 mg, 10 mg, Intravenous, Q6H PRN, Willa Brocks, MD   labetalol (NORMODYNE) tablet 100 mg, 100 mg, Oral, BID, Zierle-Ghosh, Asia B, DO, 100 mg at 10/08/20 1031   LORazepam (ATIVAN) tablet 0.5 mg, 0.5 mg, Oral, QHS PRN, Zierle-Ghosh, Asia B, DO   OLANZapine (ZYPREXA) tablet 7.5 mg, 7.5 mg, Oral, QHS, [place], Kofi, MD, 7.5 mg at 10/07/20 2204   ondansetron (ZOFRAN) tablet 4 mg, 4 mg, Oral, Q6H PRN **OR** ondansetron (ZOFRAN) injection 4 mg, 4 mg, Intravenous, Q6H PRN, Zierle-Ghosh, Asia B, DO   topiramate (TOPAMAX) tablet 50 mg, 50 mg, Oral, BID, Vicent Febles, MD, 50 mg at 10/08/20 1032   verapamil (CALAN) tablet 120 mg, 120 mg, Oral, BID, Zierle-Ghosh, Asia B, DO, 120 mg at 10/08/20 1032 ANALYSIS: A 16 channel recording using standard 10 20 measurements is conducted for 23 minutes.  The background rhythm is that of 7  to 7.5 Hz.  There is beta activity observed in frontal areas.  Awake and sleep architecture is noted.  Sleep spindles and rudimentary K complexes are noted.  Vertex sharp waves are also noted.  Photic stimulation and hyperventilation are not conducted.  There is no focal or lateral slowing.  There  is no epileptiform activity is noted. IMPRESSION: 1.  This recording of the awake and sleep state shows mild global slowing indicating a mild global encephalopathy.  Otherwise, no epileptiform discharges are noted. Kofi A. Merlene Laughter, M.D. Diplomate, Tax adviser of Psychiatry and Neurology ( Neurology).   DG FL GUIDED LUMBAR PUNCTURE  Result Date: 10/06/2020 CLINICAL DATA:  Hallucinations, confusion, history hypertension EXAM: DIAGNOSTIC LUMBAR PUNCTURE UNDER FLUOROSCOPIC GUIDANCE COMPARISON:  MR brain 10/05/2020 FLUOROSCOPY TIME:  Fluoroscopy Time:  0 minutes 6 seconds Radiation Exposure Index (if provided by the fluoroscopic device): 9.3 Number of Acquired Spot Images: 1 PROCEDURE: Procedure, benefits, and risks were discussed with the patient, including alternatives. Patient's questions were answered. Written informed consent was obtained. Timeout protocol followed. Patient placed prone. L3-L4 disc space was localized under fluoroscopy. Skin prepped and draped in usual sterile fashion. Skin and soft tissues anesthetized with 5 mL of 1% lidocaine. 22 gauge needle was advanced into the spinal canal under oblique approach where clear colorless CSF was encountered with an opening pressure of 3 cm H2O (prone). 10 mL of CSF was obtained in 4 tubes for requested analysis. Procedure tolerated very well by patient without immediate complication. IMPRESSION: Fluoroscopic guided lumbar puncture as above. Electronically Signed   By: Lavonia Dana M.D.   On: 10/06/2020 13:07    Micro Results   Recent Results (from the past 240 hour(s))  Resp Panel by RT-PCR (Flu A&B, Covid) Nasopharyngeal Swab     Status: None   Collection Time: 10/05/20  9:33 PM   Specimen: Nasopharyngeal Swab; Nasopharyngeal(NP) swabs in vial transport medium  Result Value Ref Range Status   SARS Coronavirus 2 by RT PCR NEGATIVE NEGATIVE Final    Comment: (NOTE) SARS-CoV-2 target nucleic acids are NOT DETECTED.  The SARS-CoV-2 RNA is  generally detectable in upper respiratory specimens during the acute phase of infection. The lowest concentration of SARS-CoV-2 viral copies this assay can detect is 138 copies/mL. A negative result does not preclude SARS-Cov-2 infection and should not be used as the sole basis for treatment or other patient management decisions. A negative result may occur with  improper specimen collection/handling, submission of specimen other than nasopharyngeal swab, presence of viral mutation(s) within the areas targeted by this assay, and inadequate number of viral copies(<138 copies/mL). A negative result must be combined with clinical observations, patient history, and epidemiological information. The expected result is Negative.  Fact Sheet for Patients:  EntrepreneurPulse.com.au  Fact Sheet for Healthcare Providers:  IncredibleEmployment.be  This test is no t yet approved or cleared by the [place] FDA and  has been authorized for detection and/or diagnosis of SARS-CoV-2 by FDA under an Emergency Use Authorization (EUA). This EUA will remain  in effect (meaning this test can be used) for the duration of the COVID-19 declaration under Section 564(b)(1) of the Act, 21 U.S.C.section 360bbb-3(b)(1), unless the authorization is terminated  or revoked sooner.       Influenza A by PCR NEGATIVE NEGATIVE Final   Influenza B by PCR NEGATIVE NEGATIVE Final    Comment: (NOTE) The Xpert Xpress SARS-CoV-2/FLU/RSV plus assay is intended as an aid in the diagnosis of influenza from Nasopharyngeal swab specimens  and should not be used as a sole basis for treatment. Nasal washings and aspirates are unacceptable for Xpert Xpress SARS-CoV-2/FLU/RSV testing.  Fact Sheet for Patients: EntrepreneurPulse.com.au  Fact Sheet for Healthcare Providers: IncredibleEmployment.be  This test is not yet approved or cleared by the [place] FDA and has been authorized for detection and/or diagnosis of SARS-CoV-2 by FDA under an Emergency Use Authorization (EUA). This EUA will remain in effect (meaning this test can be used) for the duration of the COVID-19 declaration under Section 564(b)(1) of the Act, 21 U.S.C. section 360bbb-3(b)(1), unless the authorization is terminated or revoked.  Performed at [facility], [address] [place], [place] [address]   Culture, blood (routine x 2)     Status: None (Preliminary result)   Collection Time: 10/06/20 12:18 AM   Specimen: BLOOD RIGHT HAND  Result Value Ref Range Status   Specimen Description BLOOD RIGHT HAND  Final   Special Requests   Final    BOTTLES DRAWN AEROBIC AND ANAEROBIC Blood Culture adequate volume   Culture   Final    NO GROWTH 3 DAYS Performed at [facility] [address] [place], [place] [address]    Report Status PENDING  Incomplete  Culture, blood (routine x 2)     Status: None (Preliminary result)   Collection Time: 10/06/20 12:25 AM   Specimen: BLOOD LEFT HAND  Result Value Ref Range Status   Specimen Description BLOOD LEFT HAND  Final   Special Requests AEROBIC BOTTLE ONLY Blood Culture adequate volume  Final   Culture   Final    NO GROWTH 3 DAYS Performed at [facility], [address] [place], [place] [address]    Report Status PENDING  Incomplete  Culture, fungus without smear     Status: None (Preliminary result)   Collection Time: 10/06/20  9:56 AM   Specimen: CSF; Other  Result Value Ref Range Status   Specimen Description CSF  Final   Special Requests NONE  Final   Culture   Final    NO FUNGUS ISOLATED AFTER 2 DAYS Performed at [facility], [address], [place], [place] [address]    Report Status PENDING  Incomplete  CSF culture w Gram Stain     Status: None (Preliminary result)   Collection Time: 10/06/20 11:15 AM   Specimen: CSF; Cerebrospinal Fluid  Result Value Ref Range Status   Specimen Description    Final    CSF Performed at [facility], [address], [place], [place] [address]    Special Requests   Final    NONE Performed at [facility], [address], [place], [place] [address]    Gram Stain   Final    NO ORGANISMS SEEN NO WBC SEEN Performed at [facility], [address], [place], [place] [address]    Culture   Final    NO GROWTH 3 DAYS Performed at [facility], [place] [address], [place],  [address]    Report Status PENDING  Incomplete       Today   Subjective    Tristana Suthers today has no new complaints -Headaches are much better hallucinations less frequent and patient understands that they are not real          Patient has been seen and examined prior to discharge   Objective   Blood pressure (!) 154/63, pulse 63, temperature 98.4 F (36.9 C), temperature source Oral, resp. rate 18, height '4\' 11"'$  (1.499  m), weight 80.8 kg, SpO2 99 %.   Intake/Output Summary (Last 24 hours) at 10/09/2020 1322 Last data filed at 10/09/2020 0900 Gross per 24 hour  Intake 240 ml  Output --  Net 240 ml    Exam Gen:- Awake Alert, no acute distress  HEENT:- [place].AT, No sclera icterus Neck-Supple Neck,No JVD,.  Lungs-  CTAB , good air movement bilaterally  CV- S1, S2 normal, regular Abd-  +ve B.Sounds, Abd Soft, No tenderness,    Extremity/Skin:- No  edema,   good pulses Psych-affect is appropriate, oriented x3 Neuro-no new focal deficits, no tremors    Data Review   CBC w Diff:  Lab Results  Component Value Date   WBC 4.9 10/08/2020   HGB 11.4 (L) 10/08/2020   HCT 36.5 10/08/2020   PLT 191 10/08/2020   LYMPHOPCT 29 10/06/2020   MONOPCT 13 10/06/2020   EOSPCT 5 10/06/2020   BASOPCT 1 10/06/2020    CMP:  Lab Results  Component Value Date   NA 143 10/09/2020   K 4.0 10/09/2020   CL 115 (H) 10/09/2020   CO2 25 10/09/2020   BUN 12 10/09/2020   CREATININE 1.56 (H) 10/09/2020   PROT 5.7 (L) 10/08/2020   ALBUMIN 2.9 (L) 10/08/2020   BILITOT  0.3 10/08/2020   ALKPHOS 69 10/08/2020   AST 17 10/08/2020   ALT 15 10/08/2020  .   Total Discharge time is about 33 minutes  Roxan Hockey M.D on 10/09/2020 at 1:22 PM  Go to [email or web address] -  for contact info  [facility] - Office  [phone]

## 2020-10-10 ENCOUNTER — Inpatient Hospital Stay (HOSPITAL_COMMUNITY)
Admission: EM | Admit: 2020-10-10 | Discharge: 2020-10-16 | DRG: 071 | Disposition: A | Discharge status: Other Acute Inpatient Hospital | Payer: Medicare HMO | Attending: Internal Medicine | Admitting: Internal Medicine

## 2020-10-10 ENCOUNTER — Other Ambulatory Visit: Payer: Self-pay

## 2020-10-10 ENCOUNTER — Encounter (HOSPITAL_COMMUNITY): Payer: Self-pay | Admitting: *Deleted

## 2020-10-10 DIAGNOSIS — R4182 Altered mental status, unspecified: Secondary | ICD-10-CM | POA: Diagnosis not present

## 2020-10-10 DIAGNOSIS — Z885 Allergy status to narcotic agent status: Secondary | ICD-10-CM

## 2020-10-10 DIAGNOSIS — G928 Other toxic encephalopathy: Secondary | ICD-10-CM | POA: Diagnosis present

## 2020-10-10 DIAGNOSIS — Z87891 Personal history of nicotine dependence: Secondary | ICD-10-CM

## 2020-10-10 DIAGNOSIS — D631 Anemia in chronic kidney disease: Secondary | ICD-10-CM | POA: Diagnosis present

## 2020-10-10 DIAGNOSIS — E538 Deficiency of other specified B group vitamins: Secondary | ICD-10-CM | POA: Diagnosis present

## 2020-10-10 DIAGNOSIS — E669 Obesity, unspecified: Secondary | ICD-10-CM | POA: Diagnosis present

## 2020-10-10 DIAGNOSIS — Z6835 Body mass index (BMI) 35.0-35.9, adult: Secondary | ICD-10-CM

## 2020-10-10 DIAGNOSIS — E876 Hypokalemia: Secondary | ICD-10-CM | POA: Diagnosis not present

## 2020-10-10 DIAGNOSIS — G4700 Insomnia, unspecified: Secondary | ICD-10-CM | POA: Diagnosis present

## 2020-10-10 DIAGNOSIS — G9341 Metabolic encephalopathy: Principal | ICD-10-CM | POA: Diagnosis present

## 2020-10-10 DIAGNOSIS — F29 Unspecified psychosis not due to a substance or known physiological condition: Secondary | ICD-10-CM | POA: Diagnosis present

## 2020-10-10 DIAGNOSIS — Z20822 Contact with and (suspected) exposure to covid-19: Secondary | ICD-10-CM | POA: Diagnosis present

## 2020-10-10 DIAGNOSIS — I129 Hypertensive chronic kidney disease with stage 1 through stage 4 chronic kidney disease, or unspecified chronic kidney disease: Secondary | ICD-10-CM | POA: Diagnosis present

## 2020-10-10 DIAGNOSIS — N289 Disorder of kidney and ureter, unspecified: Secondary | ICD-10-CM

## 2020-10-10 DIAGNOSIS — Z79899 Other long term (current) drug therapy: Secondary | ICD-10-CM

## 2020-10-10 DIAGNOSIS — R0602 Shortness of breath: Secondary | ICD-10-CM

## 2020-10-10 DIAGNOSIS — I959 Hypotension, unspecified: Secondary | ICD-10-CM | POA: Diagnosis not present

## 2020-10-10 DIAGNOSIS — Z880 Allergy status to penicillin: Secondary | ICD-10-CM

## 2020-10-10 DIAGNOSIS — M1990 Unspecified osteoarthritis, unspecified site: Secondary | ICD-10-CM | POA: Diagnosis present

## 2020-10-10 DIAGNOSIS — I6783 Posterior reversible encephalopathy syndrome: Secondary | ICD-10-CM | POA: Diagnosis present

## 2020-10-10 DIAGNOSIS — I10 Essential (primary) hypertension: Secondary | ICD-10-CM | POA: Diagnosis present

## 2020-10-10 DIAGNOSIS — M109 Gout, unspecified: Secondary | ICD-10-CM | POA: Diagnosis present

## 2020-10-10 DIAGNOSIS — N184 Chronic kidney disease, stage 4 (severe): Secondary | ICD-10-CM | POA: Diagnosis present

## 2020-10-10 LAB — CSF CULTURE W GRAM STAIN
Culture: NO GROWTH
Gram Stain: NONE SEEN

## 2020-10-10 NOTE — ED Provider Notes (Signed)
[facility] EMERGENCY DEPARTMENT Provider Note   CSN: [id number] Arrival date & time: 10/10/20  2216     History Chief Complaint  Patient presents with   Altered Mental Status    Allison Watson is a 80 y.o. female.  The history is provided by a relative. The history is limited by the condition of the patient (Altered mental status).  Altered Mental Status She has history of hypertension, psychosis, metabolic encephalopathy and was brought in by family member because of mental status change today.  She had recently been admitted to the hospital for hallucinations and had extensive work-up which showed an abnormal MRI scan of the brain.  She had been doing well since discharge.  The daughter left the patient at home, and came back and found the house in complete disarray and patient with markedly abnormal mentation.  Patient knows her name and knows the current month is June but is not able to tell me anything else.  She states she is not having any hallucinations currently.  She has been compliant with her medications since discharge.   Past Medical History:  Diagnosis Date   Arthritis    Lt arm   Gout    Hypertension     Patient Active Problem List   Diagnosis Date Noted   Psychosis ([place]) XX123456   Acute metabolic encephalopathy XX123456   INSOMNIA, CHRONIC 05/08/2007   BRONCHITIS, ACUTE WITH BRONCHOSPASM 04/16/2007   CELLULITIS/ABSCESS, TRUNK 10/31/2006   MUSCLE SPASM 10/31/2006   Essential hypertension 03/13/2006   ALLERGIC RHINITIS 03/13/2006   Osteoarthrosis, unspecified whether generalized or localized, unspecified site 03/13/2006   ARTHRITIS 03/13/2006    Past Surgical History:  Procedure Laterality Date   ABDOMINAL HYSTERECTOMY     TUBAL LIGATION       OB History   No obstetric history on file.     History reviewed. No pertinent family history.  Social History   Tobacco Use   Smoking status: Former    Pack years: 0.00   Smokeless tobacco: Never   Vaping Use   Vaping Use: Never used  Substance Use Topics   Alcohol use: No   Drug use: No    Home Medications Prior to Admission medications   Medication Sig Start Date End Date Taking? Authorizing Provider  acetaminophen (TYLENOL) 500 MG tablet Take 500 mg by mouth every 6 (six) hours as needed for mild pain.     [provider]  cholecalciferol (VITAMIN D3) 25 MCG (1000 UNIT) tablet Take 1,000 Units by mouth daily.    [provider]  diltiazem (CARDIZEM CD) 240 MG 24 hr capsule Take 240 mg by mouth daily. 07/20/20   [provider]  hydrALAZINE (APRESOLINE) 100 MG tablet Take 1 tablet (100 mg total) by mouth 2 (two) times daily. 10/09/20   Roxan Hockey, MD  labetalol (NORMODYNE) 100 MG tablet Take 1 tablet (100 mg total) by mouth 2 (two) times daily. 10/09/20   Roxan Hockey, MD  LORazepam (ATIVAN) 1 MG tablet Take 1 tablet (1 mg total) by mouth at bedtime. 10/09/20   Roxan Hockey, MD  OLANZapine (ZYPREXA) 10 MG tablet Take 1 tablet (10 mg total) by mouth at bedtime. 10/09/20   Roxan Hockey, MD  Oxycodone HCl 10 MG TABS Take 10 mg by mouth 2 (two) times daily as needed (pain).    [provider]  topiramate (TOPAMAX) 100 MG tablet Take 1 tablet (100 mg total) by mouth 2 (two) times daily. 10/09/20   Emokpae,  Courage, MD    Allergies    Codeine and Penicillins  Review of Systems   Review of Systems  Unable to perform ROS: Mental status change   Physical Exam Updated Vital Signs BP 133/64 (BP Location: Right Arm)   Pulse 87   Temp 98.6 F (37 C) (Oral)   Resp 18   Ht '4\' 11"'$  (1.499 m)   Wt 80 kg   SpO2 97%   BMI 35.62 kg/m   Physical Exam Vitals and nursing note reviewed.  80 year old female, resting comfortably and in no acute distress. Vital signs are normal. Oxygen saturation is 97%, which is normal. Head is normocephalic and atraumatic. PERRLA, EOMI. Oropharynx is clear. Neck is nontender and supple without adenopathy or  JVD. Back is nontender and there is no CVA tenderness. Lungs are clear without rales, wheezes, or rhonchi. Chest is nontender. Heart has regular rate and rhythm with 2/6 systolic ejection murmur heard at the cardiac base. Abdomen is soft, flat, nontender without masses or hepatosplenomegaly and peristalsis is normoactive. Extremities have no cyanosis or edema, full range of motion is present. Skin is warm and dry without rash. Neurologic: Awake and alert and conversant, oriented to person but not place and only minimally oriented to time (knows it is June but cannot tell me the date or year), cranial nerves are intact, there are no motor or sensory deficits.  ED Results / Procedures / Treatments   Labs (all labs ordered are listed, but only abnormal results are displayed) Labs Reviewed  COMPREHENSIVE METABOLIC PANEL - Abnormal; Notable for the following components:      Result Value   CO2 21 (*)    Glucose, Bld 111 (*)    Creatinine, Ser 1.67 (*)    GFR, Estimated 31 (*)    All other components within normal limits  CBC WITH DIFFERENTIAL/PLATELET - Abnormal; Notable for the following components:   RDW 16.9 (*)    Neutro Abs 8.4 (*)    All other components within normal limits  URINALYSIS, ROUTINE W REFLEX MICROSCOPIC - Abnormal; Notable for the following components:   APPearance HAZY (*)    Ketones, ur 20 (*)    All other components within normal limits  SARS CORONAVIRUS 2 (TAT 6-24 HRS)  ETHANOL  RAPID URINE DRUG SCREEN, HOSP PERFORMED  SEDIMENTATION RATE    EKG EKG Interpretation  Date/Time:  Monday October 11 2020 00:50:24 EDT Ventricular Rate:  84 PR Interval:  160 QRS Duration: 89 QT Interval:  381 QTC Calculation: 451 R Axis:   -14 Text Interpretation: Sinus rhythm Normal ECG No old tracing to compare Confirmed by Delora Fuel ([id number]) on 10/11/2020 12:53:48 AM  Radiology DG Chest 1 View  Result Date: 10/11/2020 CLINICAL DATA:  Altered mental status. EXAM: CHEST  1  VIEW COMPARISON:  05/14/2013 FINDINGS: The heart size and mediastinal contours are within normal limits. Both lungs are clear. The visualized skeletal structures are unremarkable. IMPRESSION: No active disease. Electronically Signed   By: Kerby Moors M.D.   On: 10/11/2020 00:57    Procedures Procedures   Medications Ordered in ED Medications - No data to display  ED Course  I have reviewed the triage vital signs and the nursing notes.  Pertinent labs & imaging results that were available during my care of the patient were reviewed by me and considered in my medical decision making (see chart for details).   MDM Rules/Calculators/A&P  Altered mental status of uncertain cause.  This does seem to be a continuation of her the process that she was hospitalized for recently.  Exam is nonfocal.  Will check screening labs.  Old records are reviewed showing extensive work-up while hospitalized including lumbar puncture, but the only significant abnormal finding was MRI suggestive of metabolic encephalopathy.  Chest x-ray shows no acute process.  Labs show mild renal insufficiency which is not significantly changed from baseline.  Urinalysis shows no sign of infection.  Sedimentation rate is normal.  ECG is normal.  Patient was reevaluated and mental status is unchanged.  Cause for mental status changes not clear.  Case is discussed with Dr. Josephine Cables of [facility], will plan to admit for repeat evaluation by neurology, consideration for repeat MRI scan.  Final Clinical Impression(s) / ED Diagnoses Final diagnoses:  Altered mental status, unspecified altered mental status type  Renal insufficiency    Rx / DC Orders ED Discharge Orders     None        Delora Fuel, MD [phone]

## 2020-10-10 NOTE — ED Notes (Signed)
Daughter had video on her phone of pt lying on bed, not knowing where she was with items thrown in floor

## 2020-10-10 NOTE — ED Triage Notes (Signed)
Per daughter, pt was discharged from the hospital yesterday for encephalitis; pt was found by daughter lying on her bed after throwing items off the nightstand and picking things off the wall; pt is alert in triage and slow to answer questions; pt knows where she is and her name and dob but unable to state the date

## 2020-10-11 ENCOUNTER — Emergency Department (HOSPITAL_COMMUNITY): Payer: Medicare HMO

## 2020-10-11 DIAGNOSIS — N1832 Chronic kidney disease, stage 3b: Secondary | ICD-10-CM | POA: Diagnosis not present

## 2020-10-11 DIAGNOSIS — D631 Anemia in chronic kidney disease: Secondary | ICD-10-CM

## 2020-10-11 DIAGNOSIS — I10 Essential (primary) hypertension: Secondary | ICD-10-CM | POA: Diagnosis not present

## 2020-10-11 DIAGNOSIS — D649 Anemia, unspecified: Secondary | ICD-10-CM | POA: Insufficient documentation

## 2020-10-11 DIAGNOSIS — G4700 Insomnia, unspecified: Secondary | ICD-10-CM

## 2020-10-11 DIAGNOSIS — N189 Chronic kidney disease, unspecified: Secondary | ICD-10-CM | POA: Diagnosis present

## 2020-10-11 DIAGNOSIS — R410 Disorientation, unspecified: Secondary | ICD-10-CM | POA: Diagnosis not present

## 2020-10-11 DIAGNOSIS — E669 Obesity, unspecified: Secondary | ICD-10-CM | POA: Diagnosis present

## 2020-10-11 DIAGNOSIS — R4182 Altered mental status, unspecified: Secondary | ICD-10-CM | POA: Insufficient documentation

## 2020-10-11 LAB — URINALYSIS, ROUTINE W REFLEX MICROSCOPIC
Bilirubin Urine: NEGATIVE
Glucose, UA: NEGATIVE mg/dL
Hgb urine dipstick: NEGATIVE
Ketones, ur: 20 mg/dL — AB
Leukocytes,Ua: NEGATIVE
Nitrite: NEGATIVE
Protein, ur: NEGATIVE mg/dL
Specific Gravity, Urine: 1.015 (ref 1.005–1.030)
pH: 5 (ref 5.0–8.0)

## 2020-10-11 LAB — PROTIME-INR
INR: 1.1 (ref 0.8–1.2)
Prothrombin Time: 14.6 seconds (ref 11.4–15.2)

## 2020-10-11 LAB — COMPREHENSIVE METABOLIC PANEL
ALT: 18 U/L (ref 0–44)
ALT: 17 U/L (ref 0–44)
AST: 19 U/L (ref 15–41)
AST: 19 U/L (ref 15–41)
Albumin: 3.5 g/dL (ref 3.5–5.0)
Albumin: 3.3 g/dL — ABNORMAL LOW (ref 3.5–5.0)
Alkaline Phosphatase: 80 U/L (ref 38–126)
Alkaline Phosphatase: 74 U/L (ref 38–126)
Anion gap: 8 (ref 5–15)
Anion gap: 9 (ref 5–15)
BUN: 14 mg/dL (ref 8–23)
BUN: 13 mg/dL (ref 8–23)
CO2: 21 mmol/L — ABNORMAL LOW (ref 22–32)
CO2: 21 mmol/L — ABNORMAL LOW (ref 22–32)
Calcium: 9.7 mg/dL (ref 8.9–10.3)
Calcium: 9.9 mg/dL (ref 8.9–10.3)
Chloride: 109 mmol/L (ref 98–111)
Chloride: 109 mmol/L (ref 98–111)
Creatinine, Ser: 1.67 mg/dL — ABNORMAL HIGH (ref 0.44–1.00)
Creatinine, Ser: 1.65 mg/dL — ABNORMAL HIGH (ref 0.44–1.00)
GFR, Estimated: 31 mL/min — ABNORMAL LOW (ref >60)
GFR, Estimated: 32 mL/min — ABNORMAL LOW (ref >60)
Glucose, Bld: 111 mg/dL — ABNORMAL HIGH (ref 70–99)
Glucose, Bld: 102 mg/dL — ABNORMAL HIGH (ref 70–99)
Potassium: 3.8 mmol/L (ref 3.5–5.1)
Potassium: 3.9 mmol/L (ref 3.5–5.1)
Sodium: 138 mmol/L (ref 135–145)
Sodium: 139 mmol/L (ref 135–145)
Total Bilirubin: 0.4 mg/dL (ref 0.3–1.2)
Total Bilirubin: 0.4 mg/dL (ref 0.3–1.2)
Total Protein: 6.6 g/dL (ref 6.5–8.1)
Total Protein: 6 g/dL — ABNORMAL LOW (ref 6.5–8.1)

## 2020-10-11 LAB — CBC WITH DIFFERENTIAL/PLATELET
Abs Immature Granulocytes: 0.03 10*3/uL (ref 0.00–0.07)
Basophils Absolute: 0.1 10*3/uL (ref 0.0–0.1)
Basophils Relative: 1 %
Eosinophils Absolute: 0 10*3/uL (ref 0.0–0.5)
Eosinophils Relative: 0 %
HCT: 38.2 % (ref 36.0–46.0)
Hemoglobin: 12.2 g/dL (ref 12.0–15.0)
Immature Granulocytes: 0 %
Lymphocytes Relative: 9 %
Lymphs Abs: 0.8 10*3/uL (ref 0.7–4.0)
MCH: 27.4 pg (ref 26.0–34.0)
MCHC: 31.9 g/dL (ref 30.0–36.0)
MCV: 85.8 fL (ref 80.0–100.0)
Monocytes Absolute: 0.3 10*3/uL (ref 0.1–1.0)
Monocytes Relative: 3 %
Neutro Abs: 8.4 10*3/uL — ABNORMAL HIGH (ref 1.7–7.7)
Neutrophils Relative %: 87 %
Platelets: 223 10*3/uL (ref 150–400)
RBC: 4.45 MIL/uL (ref 3.87–5.11)
RDW: 16.9 % — ABNORMAL HIGH (ref 11.5–15.5)
WBC: 9.7 10*3/uL (ref 4.0–10.5)
nRBC: 0 % (ref 0.0–0.2)

## 2020-10-11 LAB — RAPID URINE DRUG SCREEN, HOSP PERFORMED
Amphetamines: NOT DETECTED
Barbiturates: NOT DETECTED
Benzodiazepines: NOT DETECTED
Cocaine: NOT DETECTED
Opiates: NOT DETECTED
Tetrahydrocannabinol: NOT DETECTED

## 2020-10-11 LAB — CBC
HCT: 37.1 % (ref 36.0–46.0)
Hemoglobin: 11.7 g/dL — ABNORMAL LOW (ref 12.0–15.0)
MCH: 27 pg (ref 26.0–34.0)
MCHC: 31.5 g/dL (ref 30.0–36.0)
MCV: 85.7 fL (ref 80.0–100.0)
Platelets: 221 10*3/uL (ref 150–400)
RBC: 4.33 MIL/uL (ref 3.87–5.11)
RDW: 16.8 % — ABNORMAL HIGH (ref 11.5–15.5)
WBC: 7.6 10*3/uL (ref 4.0–10.5)
nRBC: 0 % (ref 0.0–0.2)

## 2020-10-11 LAB — CULTURE, BLOOD (ROUTINE X 2)
Culture: NO GROWTH
Culture: NO GROWTH
Special Requests: ADEQUATE
Special Requests: ADEQUATE

## 2020-10-11 LAB — ETHANOL: Alcohol, Ethyl (B): <10 mg/dL (ref <10)

## 2020-10-11 LAB — SARS CORONAVIRUS 2 (TAT 6-24 HRS): SARS Coronavirus 2: NEGATIVE

## 2020-10-11 LAB — APTT: aPTT: 24 seconds (ref 24–36)

## 2020-10-11 LAB — SEDIMENTATION RATE: Sed Rate: 2 mm/hr (ref 0–22)

## 2020-10-11 LAB — OLIGOCLONAL BANDS, CSF + SERM

## 2020-10-11 LAB — PHOSPHORUS: Phosphorus: 2.6 mg/dL (ref 2.5–4.6)

## 2020-10-11 LAB — MAGNESIUM: Magnesium: 1.6 mg/dL — ABNORMAL LOW (ref 1.7–2.4)

## 2020-10-11 IMAGING — DX DG CHEST 1V
1 series · 2 of 2 positions shown · non-contrast
Comparison: [DATE]

CLINICAL DATA: Altered mental status.

EXAM:
CHEST  1 VIEW

[Series 3: chest ap · 0.14mm/px · 2 of 2 slices shown]
[im 1/2]
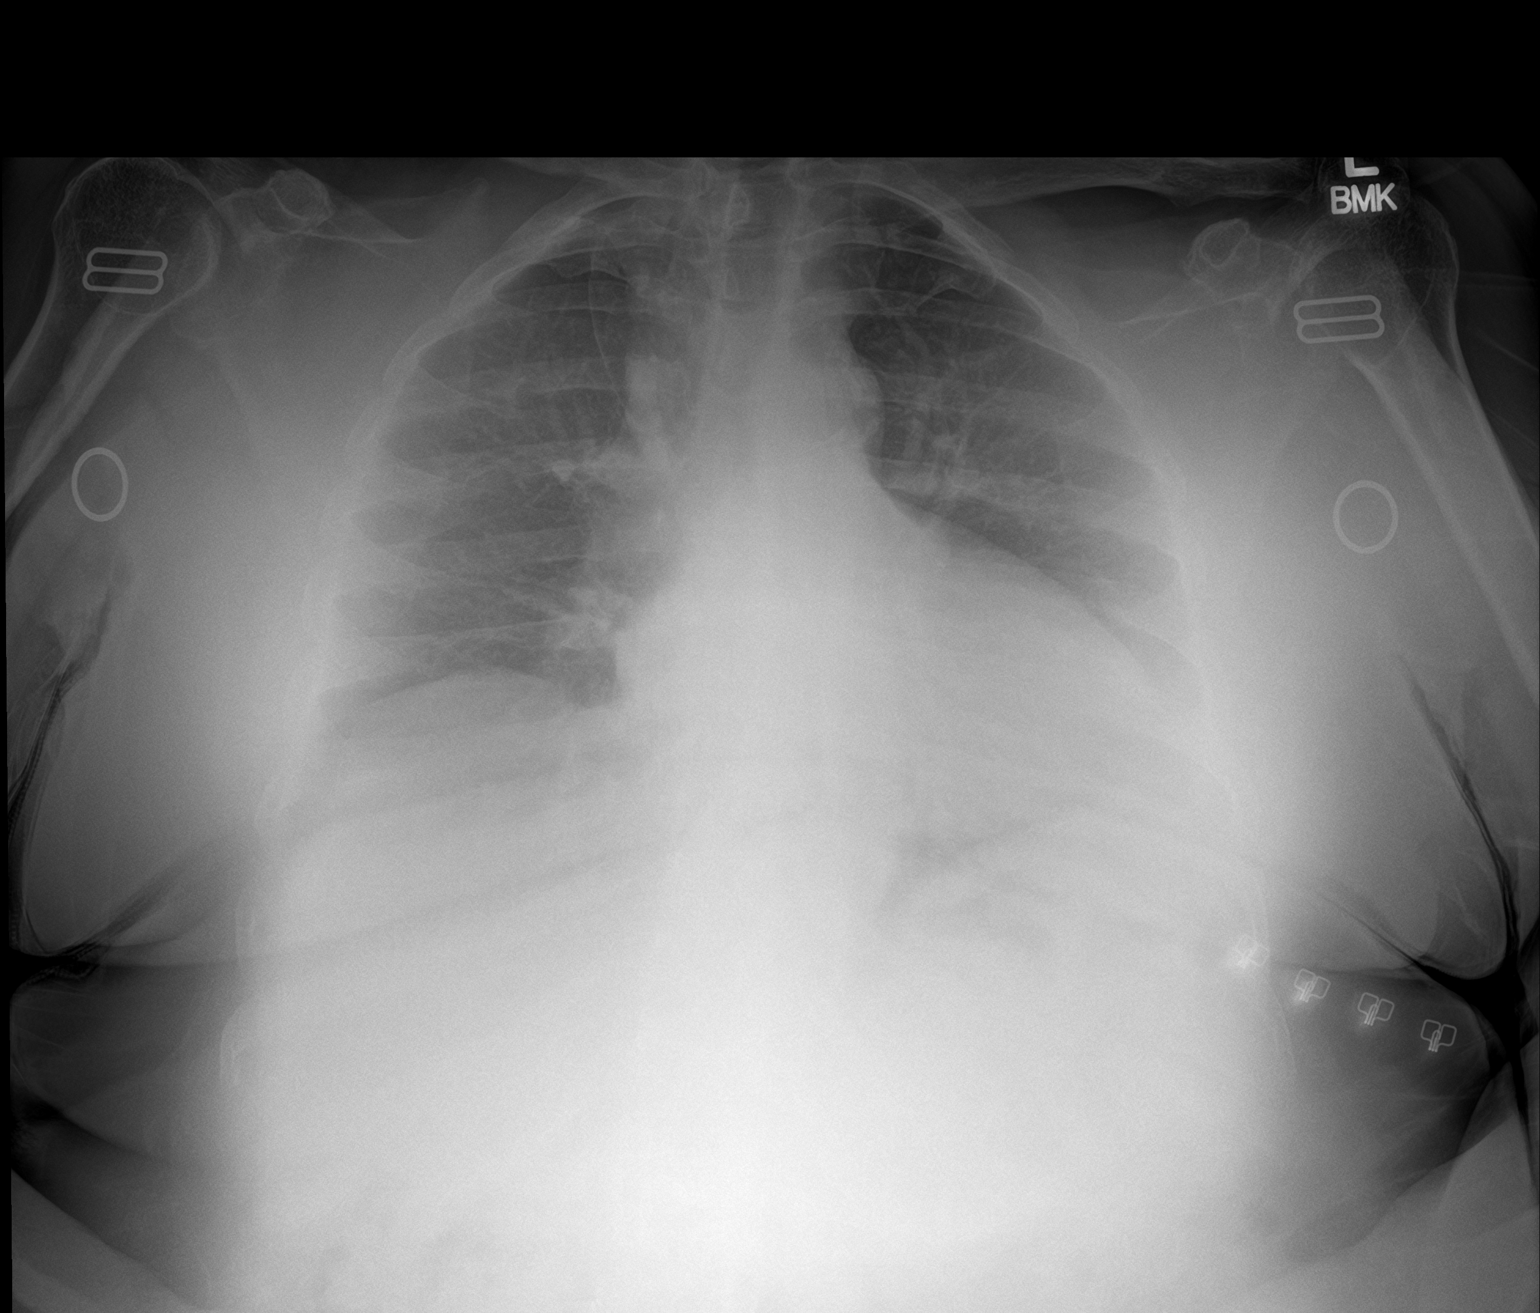
[im 2/2]
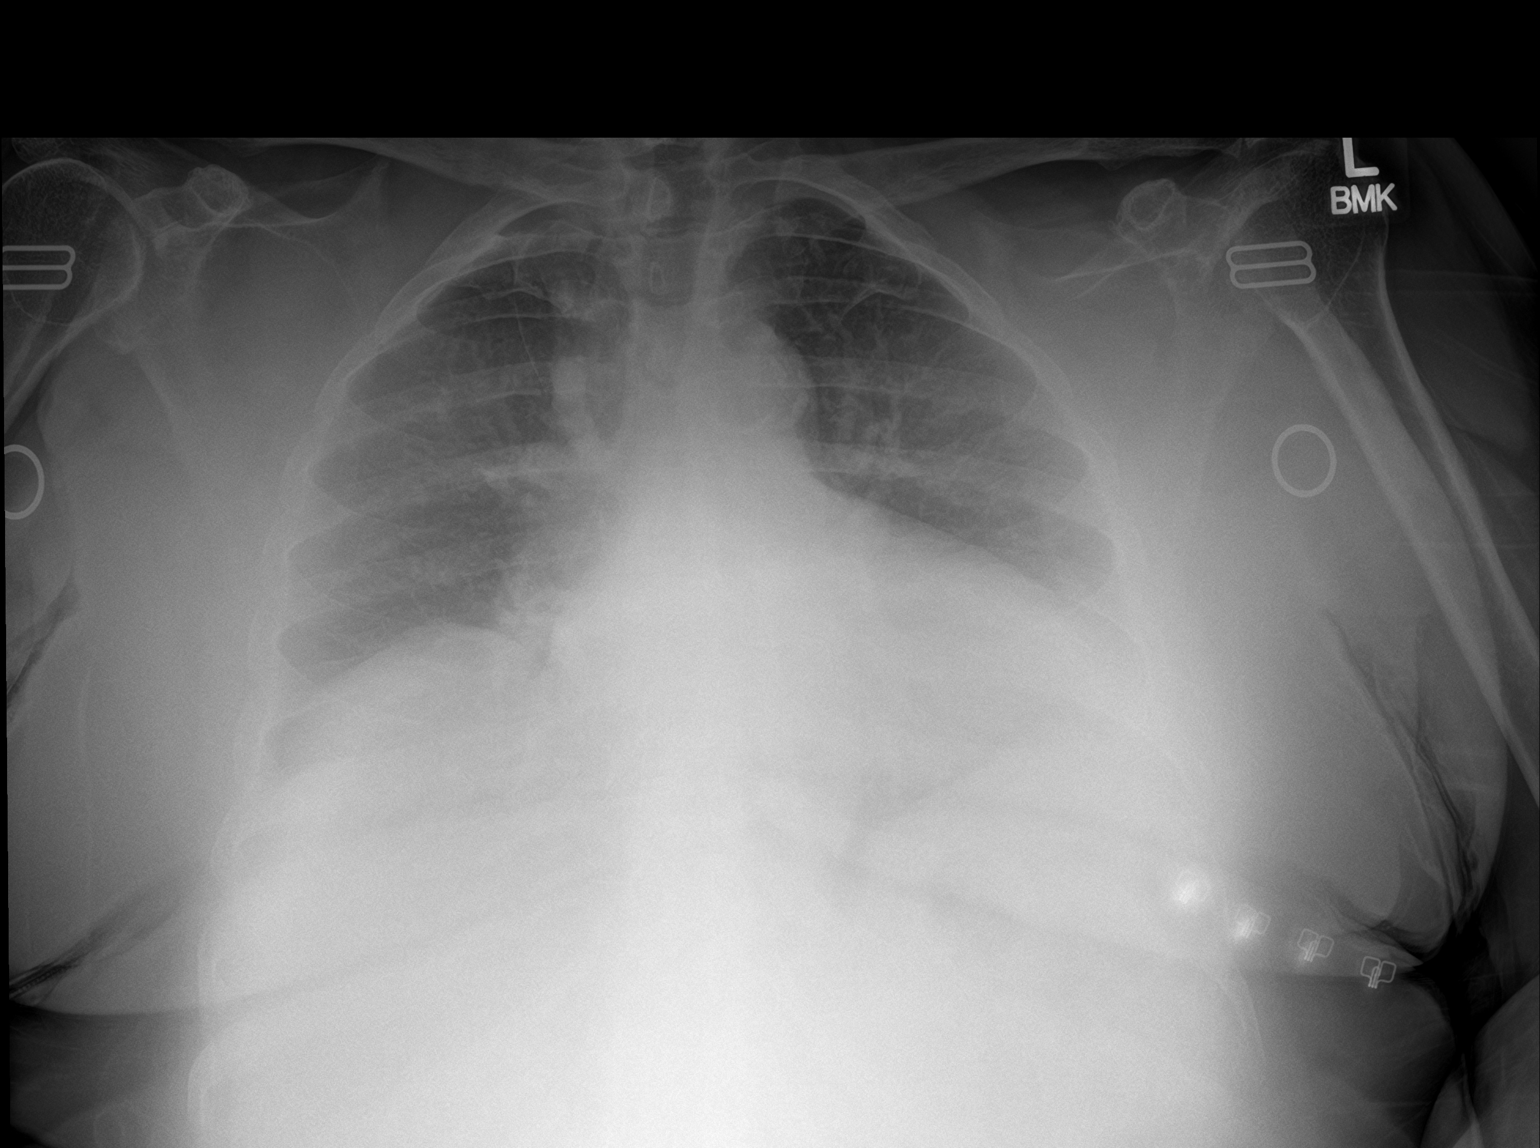

[2 of 2 positions shown; findings below may reference images not displayed]

FINDINGS: The heart size and mediastinal contours are within normal limits.
Both lungs are clear. The visualized skeletal structures are
unremarkable.
IMPRESSION: No active disease.

## 2020-10-11 MED ORDER — ZIPRASIDONE MESYLATE 20 MG IM SOLR: 20.0000 mg | Freq: Once | INTRAMUSCULAR | Status: AC

## 2020-10-11 MED ORDER — HALOPERIDOL LACTATE 5 MG/ML IJ SOLN
INTRAMUSCULAR | Status: AC
  Administered 2020-10-11: 3 mg via INTRAVENOUS
  Filled 2020-10-11: qty 1

## 2020-10-11 MED ORDER — ZIPRASIDONE MESYLATE 20 MG IM SOLR
INTRAMUSCULAR | Status: AC
  Administered 2020-10-11: 20 mg via INTRAMUSCULAR
  Filled 2020-10-11: qty 20

## 2020-10-11 MED ORDER — ENOXAPARIN SODIUM 30 MG/0.3ML IJ SOSY
30.0000 mg | PREFILLED_SYRINGE | INTRAMUSCULAR | Status: DC
  Administered 2020-10-11 – 2020-10-16 (×5): 30 mg via SUBCUTANEOUS
  Filled 2020-10-11 (×6): qty 0.3

## 2020-10-11 MED ORDER — TOPIRAMATE 100 MG PO TABS
100.0000 mg | ORAL_TABLET | Freq: Two times a day (BID) | ORAL | Status: DC
  Administered 2020-10-11 – 2020-10-12 (×2): 100 mg via ORAL
  Filled 2020-10-11 (×2): qty 1

## 2020-10-11 MED ORDER — HALOPERIDOL LACTATE 5 MG/ML IJ SOLN
2.0000 mg | Freq: Once | INTRAMUSCULAR | Status: AC
  Administered 2020-10-11: 2 mg via INTRAVENOUS

## 2020-10-11 MED ORDER — STERILE WATER FOR INJECTION IJ SOLN
INTRAMUSCULAR | Status: AC
  Administered 2020-10-11: 1.5 mL
  Filled 2020-10-11: qty 10

## 2020-10-11 MED ORDER — MELATONIN 3 MG PO TABS: 3.0000 mg | ORAL_TABLET | Freq: Every day | ORAL | Status: DC

## 2020-10-11 MED ORDER — QUETIAPINE FUMARATE 25 MG PO TABS
25.0000 mg | ORAL_TABLET | Freq: Every day | ORAL | Status: DC
  Administered 2020-10-11: 25 mg via ORAL
  Filled 2020-10-11: qty 1

## 2020-10-11 MED ORDER — HALOPERIDOL LACTATE 5 MG/ML IJ SOLN
2.0000 mg | Freq: Four times a day (QID) | INTRAMUSCULAR | Status: DC | PRN
  Administered 2020-10-12: 3 mg via INTRAVENOUS
  Filled 2020-10-11 (×2): qty 1

## 2020-10-11 MED ORDER — ACETAMINOPHEN 500 MG PO TABS: 500.0000 mg | ORAL_TABLET | Freq: Four times a day (QID) | ORAL | Status: DC | PRN

## 2020-10-11 MED ORDER — LABETALOL HCL 100 MG PO TABS
100.0000 mg | ORAL_TABLET | Freq: Two times a day (BID) | ORAL | Status: DC
  Administered 2020-10-11 – 2020-10-16 (×8): 100 mg via ORAL
  Filled 2020-10-11 (×10): qty 1

## 2020-10-11 MED ORDER — CLONIDINE HCL 0.1 MG PO TABS
0.1000 mg | ORAL_TABLET | Freq: Three times a day (TID) | ORAL | Status: DC
  Administered 2020-10-11 – 2020-10-16 (×11): 0.1 mg via ORAL
  Filled 2020-10-11 (×13): qty 1

## 2020-10-11 MED ORDER — HALOPERIDOL LACTATE 5 MG/ML IJ SOLN: 3.0000 mg | Freq: Once | INTRAMUSCULAR | Status: AC

## 2020-10-11 MED ORDER — VITAMIN D 25 MCG (1000 UNIT) PO TABS
1000.0000 [IU] | ORAL_TABLET | Freq: Every day | ORAL | Status: DC
  Administered 2020-10-12 – 2020-10-16 (×5): 1000 [IU] via ORAL
  Filled 2020-10-11 (×5): qty 1

## 2020-10-11 MED ORDER — MAGNESIUM SULFATE 2 GM/50ML IV SOLN
2.0000 g | Freq: Once | INTRAVENOUS | Status: AC
  Administered 2020-10-11: 2 g via INTRAVENOUS
  Filled 2020-10-11: qty 50

## 2020-10-11 NOTE — ED Notes (Signed)
Pt found trying to climb out of bed, pulled IV out. Pt redirected and placed back into bed with bed alarm.  RN will attempt second IV access.

## 2020-10-11 NOTE — H&P (Signed)
History and Physical  ZARIYHA CHOLICO [id number] DOB: 20-Feb-1941 DOA: 10/10/2020  Referring physician: Delora Fuel, MD PCP: Lucia Gaskins, MD  Patient coming from: Home  Chief Complaint: Altered mental status  HPI: Allison Watson is a 80 y.o. female with medical history significant for  insomnia, chronic pain, hypertension, gout, arthritis who presents to the emergency department accompanied by daughter due to altered mental status which started yesterday.  Patient was recently admitted from 6/14-6/18 due to acute metabolic encephalopathy and hallucination.  Patient reports seeing people, lizards, snakes and other animals coming out of the walls.  Patient states that she was prescribed sleep medication due to insomnia about 2 months ago, however, the medication appeared to make her to be hallucinating, so she stopped it and this was changed with another sleep medication which she also stopped taking due to same hallucinating effect, after the medication was being changed the 3rd time due to same hallucinations, patient started to take melatonin which appear to have less side effect. Daughter at bedside states that patient showed some improvement when she first returned home from hospital, she states that she left patient at home for a few hours and when she returned, the house was in disarray and patient's mentation has markedly altered.  She also states that patient's  gait changed as she noted that she was taking very small steps (festinating gait), she was worried, so she activated EMS and patient was taken to the ED for further evaluation and management.  ED Course:  In the emergency department, she was hemodynamically stable.  Work-up in the ED showed normal CBC, BUN/creatinine 14/1.67 (creatinine was within baseline range), magnesium 1.6.  Chest x-ray showed no active disease.  Hospitalist was asked to admit patient for further evaluation and management. At bedside, patient was alert  and oriented to person and time, but not to place.  She states that she knows that the hallucinations were not real, but she continues to have recurrences of the hallucinations.  Daughter at bedside states that her mentation momentarily improved while I was at bedside, but that the mentation intermittently alters.  Review of Systems: Constitutional: Negative for chills and fever.  HENT: Negative for ear pain and sore throat.   Eyes: Negative for pain and visual disturbance.  Respiratory: Negative for cough, chest tightness and shortness of breath.   Cardiovascular: Negative for chest pain and palpitations.  Gastrointestinal: Negative for abdominal pain and vomiting.  Endocrine: Negative for polyphagia and polyuria.  Genitourinary: Negative for decreased urine volume, dysuria, enuresis Musculoskeletal: Negative for arthralgias and back pain.  Skin: Negative for color change and rash.  Allergic/Immunologic: Negative for immunocompromised state.  Neurological: Positive for hallucinations.  Negative for tremors, syncope, speech difficulty Hematological: Does not bruise/bleed easily.  All other systems reviewed and are negative   Past Medical History:  Diagnosis Date   Arthritis    Lt arm   Gout    Hypertension    Past Surgical History:  Procedure Laterality Date   ABDOMINAL HYSTERECTOMY     TUBAL LIGATION      Social History:  reports that she has quit smoking. She has never used smokeless tobacco. She reports that she does not drink alcohol and does not use drugs.   Allergies  Allergen Reactions   Codeine Rash   Penicillins Rash    Has patient had a PCN reaction causing immediate rash, facial/tongue/throat swelling, SOB or lightheadedness with hypotension: Yes Has patient had a PCN reaction causing severe rash  involving mucus membranes or skin necrosis: No Has patient had a PCN reaction that required hospitalization No Has patient had a PCN reaction occurring within the last 10  years: No If all of the above answers are "NO", then may proceed with Cephalosporin use.     History reviewed. No pertinent family history.    Prior to Admission medications   Medication Sig Start Date End Date Taking? Authorizing Provider  acetaminophen (TYLENOL) 500 MG tablet Take 500 mg by mouth every 6 (six) hours as needed for mild pain.     [provider]  cholecalciferol (VITAMIN D3) 25 MCG (1000 UNIT) tablet Take 1,000 Units by mouth daily.    [provider]  diltiazem (CARDIZEM CD) 240 MG 24 hr capsule Take 240 mg by mouth daily. 07/20/20   [provider]  hydrALAZINE (APRESOLINE) 100 MG tablet Take 1 tablet (100 mg total) by mouth 2 (two) times daily. 10/09/20   Roxan Hockey, MD  labetalol (NORMODYNE) 100 MG tablet Take 1 tablet (100 mg total) by mouth 2 (two) times daily. 10/09/20   Roxan Hockey, MD  LORazepam (ATIVAN) 1 MG tablet Take 1 tablet (1 mg total) by mouth at bedtime. 10/09/20   Roxan Hockey, MD  OLANZapine (ZYPREXA) 10 MG tablet Take 1 tablet (10 mg total) by mouth at bedtime. 10/09/20   Roxan Hockey, MD  Oxycodone HCl 10 MG TABS Take 10 mg by mouth 2 (two) times daily as needed (pain).    [provider]  topiramate (TOPAMAX) 100 MG tablet Take 1 tablet (100 mg total) by mouth 2 (two) times daily. 10/09/20   Roxan Hockey, MD    Physical Exam: BP 134/73   Pulse 86   Temp 98.6 F (37 C) (Oral)   Resp 18   Ht '4\' 11"'$  (1.499 m)   Wt 80 kg   SpO2 98%   BMI 35.62 kg/m   General: 80 y.o. year-old female well developed well nourished in no acute distress.  Alert and oriented x3. HEENT: NCAT, EOMI Neck: Supple, trachea medial Cardiovascular: Regular rate and rhythm with no rubs or gallops.  No thyromegaly or JVD noted.  No lower extremity edema. 2/4 pulses in all 4 extremities. Respiratory: Clear to auscultation with no wheezes or rales. Good inspiratory effort. Abdomen: Soft, nontender nondistended with normal  bowel sounds x4 quadrants. Muskuloskeletal: No cyanosis, clubbing or edema noted bilaterally Neuro: CN II-XII intact, strength 5/5 x 4, sensation, reflexes intact Skin: No ulcerative lesions noted or rashes Psychiatry: Judgement and insight appear normal. Mood is appropriate for condition and setting          Labs on Admission:  Basic Metabolic Panel: Recent Labs  Lab 10/06/20 0507 10/08/20 0635 10/09/20 0702 10/11/20 0051 10/11/20 0518  NA 139 142 143 138 139  K 3.7 4.1 4.0 3.8 3.9  CL 107 115* 115* 109 109  CO2 26 20* 25 21* 21*  GLUCOSE 99 97 95 111* 102*  BUN 24* '15 12 14 13  '$ CREATININE 1.83* 1.52* 1.56* 1.67* 1.65*  CALCIUM 9.2 9.2 9.5 9.7 9.9  MG 1.7  --   --   --  1.6*  PHOS  --   --   --   --  2.6   Liver Function Tests: Recent Labs  Lab 10/06/20 0018 10/06/20 0507 10/08/20 0635 10/11/20 0051 10/11/20 0518  AST '17 15 17 19 19  '$ ALT '18 15 15 18 17  '$ ALKPHOS 72 65 69 80 74  BILITOT 0.7 0.4  0.3 0.4 0.4  PROT 6.2* 5.7* 5.7* 6.6 6.0*  ALBUMIN 3.4* 3.1* 2.9* 3.5 3.3*   No results for input(s): LIPASE, AMYLASE in the last 168 hours. No results for input(s): AMMONIA in the last 168 hours. CBC: Recent Labs  Lab 10/05/20 2228 10/06/20 0018 10/06/20 0507 10/08/20 0635 10/11/20 0051 10/11/20 0518  WBC 5.8 5.6 6.0 4.9 9.7 7.6  NEUTROABS 3.4 3.0 3.1  --  8.4*  --   HGB 12.3 12.4 12.2 11.4* 12.2 11.7*  HCT 38.3 38.8 39.2 36.5 38.2 37.1  MCV 85.9 85.8 87.9 87.3 85.8 85.7  PLT 206 214 193 191 223 221   Cardiac Enzymes: No results for input(s): CKTOTAL, CKMB, CKMBINDEX, TROPONINI in the last 168 hours.  BNP (last 3 results) No results for input(s): BNP in the last 8760 hours.  ProBNP (last 3 results) No results for input(s): PROBNP in the last 8760 hours.  CBG: No results for input(s): GLUCAP in the last 168 hours.  Radiological Exams on Admission: DG Chest 1 View  Result Date: 10/11/2020 CLINICAL DATA:  Altered mental status. EXAM: CHEST  1 VIEW  COMPARISON:  05/14/2013 FINDINGS: The heart size and mediastinal contours are within normal limits. Both lungs are clear. The visualized skeletal structures are unremarkable. IMPRESSION: No active disease. Electronically Signed   By: Kerby Moors M.D.   On: 10/11/2020 00:57    EKG: I independently viewed the EKG done and my findings are as followed: Normal sinus rhythm at a rate of 84 bpm  Assessment/Plan Present on Admission:  Altered mental status  INSOMNIA, CHRONIC  Essential hypertension  Active Problems:   INSOMNIA, CHRONIC   Essential hypertension   Altered mental status   Obesity (BMI 30-39.9)   Hypomagnesemia   Normocytic anemia   Anemia due to chronic kidney disease  Altered mental status MRI done during last admission showed concern for possible meningitis/encephalitis Vs postictal hyperemia Vs neurosarcoidosis, Vs vasculitis Vs leptomeningeal carcinomatosis CSF fluid studies done on 10/06/2020 were reassuring from infection standpoint. Blood and CSF cultures are NGTD, CSF Cryptococcal antigen is Neg, CSF angiotensin-converting enzymes WNL,  CSF cytology without malignancy, EEG done was without any acute findings and patient was discharged on Zyprexa 10 mg at night.  Patient was seen by neurologist during the admission. Continue fall precaution and neurochecks Neurology will be consulted and we shall await further recommendation Home CNS meds will be held at this time  Essential hypertension Continue hydralazine and labetalol  Hypomagnesemia Mg 1.6; this will be replenished  Anemia due to chronic kidney disease 3 BUN/creatinine 14/1.67 (creatinine was within baseline range) Renally adjust medications, avoid nephrotoxic agents/dehydration/hypotension  Insomnia Continue melatonin  Obesity (BMI 35.62) Patient will be counseled on diet and lifestyle modification  DVT prophylaxis: Lovenox  Code Status: Full code  Family Communication: Daughter at bedside (all  questions answered to satisfaction)  Disposition Plan:  Patient is from:                        home Anticipated DC to:                   SNF or family members home Anticipated DC date:               2-3 days Anticipated DC barriers:          Patient requires inpatient management for recurrent altered mental status and pending neurology consult  Consults called: Neurology   Admission status: Observation  Bernadette Hoit MD [facility]  10/11/2020, 6:51 AM

## 2020-10-11 NOTE — ED Notes (Signed)
Patient has to be redirected to stay in her room and to use her call bell for assistance.

## 2020-10-11 NOTE — ED Notes (Signed)
Patient assisted off of the floor with security at bedside. Patient agreed to go into her room and sprite was given to patient.

## 2020-10-11 NOTE — ED Notes (Signed)
Patient resting in chair with eyes closed. Patient ate 100% of lunch.

## 2020-10-11 NOTE — ED Notes (Addendum)
Pt got out of bed and was assisted to the restroom. Pt is A/O x3. Pt ambulated back to bed and is sitting on side of the bed. Pt refuses to place feet in bed. Bed alarm on and stretcher locked in lowest position. Will continue to monitor patient.

## 2020-10-11 NOTE — ED Notes (Signed)
Report received from [place], [place]. Patient alert and oriented X3. Patient currently has chair alarm due to AMS.

## 2020-10-11 NOTE — ED Notes (Signed)
Patient took pants off and urinated on the floor; patient's floor cleaned and provided with a new gown and educated to stay in the chair until this nurse returns with a wheelchair. Patient verbalizes understanding but continues to argue with rules

## 2020-10-11 NOTE — ED Notes (Signed)
Patient continues to get out of bed with chair alarm. Patient assisted into a chair at the bedside and alarm placed underneath patient. Patient continues to ignore rules and remains oriented X3 but continues to refuse to stay in bed.

## 2020-10-11 NOTE — ED Notes (Signed)
Patient crying hysterically and offering apologies.

## 2020-10-11 NOTE — ED Notes (Signed)
Patient resting in bed. Patient given drink at this time.

## 2020-10-11 NOTE — ED Notes (Signed)
Daughter updated on patient status at this time. Patient is confused and asking questions about the floor versus the bed and where should she sit. Daughter states she will be up here later this afternoon. Daughter very appreciative and understanding of patient care.

## 2020-10-11 NOTE — ED Notes (Signed)
Patient lying in bed on her stomach. Bed rails up X2. Patient's glasses taken off and placed at the bedside. Patient covered with a blanket. Patient resting in beds with eyes closed. Respirations even and unlabored.

## 2020-10-11 NOTE — ED Notes (Signed)
Security at the bedside to escort patient back to her room because patient in room 18 and refusing to go to room 14, where her belongings are currently. Dr. Thereasa Solo messaged at this time; awaiting orders. Patient becoming irate and aggravated and sat in the floor refusing to get up to go into room 14.

## 2020-10-11 NOTE — ED Notes (Signed)
Patient continues to wonder the hallways and does not follow directions. Patient has to be redirected multiple times to stay in the room.

## 2020-10-11 NOTE — ED Notes (Signed)
Pt ambulated to restroom. 

## 2020-10-11 NOTE — Progress Notes (Signed)
Allison Watson  [id number] DOB: Nov 23, 1940 DOA: 10/10/2020 PCP: Lucia Gaskins, MD    Brief Narrative:  80 year old with a history of insomnia, chronic pain, HTN, gout, and arthritis who presented to the ED with family who reported AMS x24 hours.  The patient required admission 6/14-6/18/2022 with acute metabolic encephalopathy to include hallucinations.  At the time this was felt to be related to sleep medication.  Though she initially improved following her discharge 6/18, her symptoms recurred rapidly and she was brought back to the ED.  Significant Events:  6/14-6/18/2022 admit to hospital for AMS with extensive work-up 6/20 return to ED with recurrence of symptoms for readmission  Consultants:  Neurology  Code Status: FULL CODE  Antimicrobials:  None  DVT prophylaxis: Lovenox  Subjective: Patient was examined and interviewed by one of my partners earlier today.  Assessment & Plan:  Altered mental status -hallucinations Extensive work-up during recent hospital stay included lumbar puncture, MRI brain, and EEG - Neurology re-consulted at time of admission but is not yet seen patient - UDS negative - UA suggestive of dehydration but not UTI -check [id number] and folic acid  HTN Blood pressure modestly elevated likely related to agitation -monitor without change in treatment for now  Hypomagnesemia Supplement and follow  CKD stage III Baseline creatinine approximately 1.65 -follow trend  Normocytic anemia Due to CKD  Insomnia Initiate trial of Seroquel tonight  Obesity - Body mass index is 35.62 kg/m.     Family Communication:  Status is: Observation  The patient remains OBS appropriate and will d/c before 2 midnights.  Dispo: The patient is from: Home              Anticipated d/c is to:  Unclear              Patient currently is not medically stable to d/c.   Difficult to place patient No   Objective: Blood pressure (!) 160/64, pulse 98, temperature  98.6 F (37 C), temperature source Oral, resp. rate (!) 23, height '4\' 11"'$  (1.499 m), weight 80 kg, SpO2 100 %.  Intake/Output Summary (Last 24 hours) at 10/11/2020 1201 Last data filed at 10/11/2020 [id number] Gross per 24 hour  Intake 50 ml  Output --  Net 50 ml   Filed Weights   10/10/20 2254  Weight: 80 kg    Examination: Patient was seen for follow-up exam  CBC: Recent Labs  Lab 10/06/20 0018 10/06/20 0507 10/08/20 0635 10/11/20 0051 10/11/20 0518  WBC 5.6 6.0 4.9 9.7 7.6  NEUTROABS 3.0 3.1  --  8.4*  --   HGB 12.4 12.2 11.4* 12.2 11.7*  HCT 38.8 39.2 36.5 38.2 37.1  MCV 85.8 87.9 87.3 85.8 85.7  PLT 214 193 191 223 [id number]   Basic Metabolic Panel: Recent Labs  Lab 10/06/20 0507 10/08/20 0635 10/09/20 0702 10/11/20 0051 10/11/20 0518  NA 139   < > 143 138 139  K 3.7   < > 4.0 3.8 3.9  CL 107   < > 115* 109 109  CO2 26   < > 25 21* 21*  GLUCOSE 99   < > 95 111* 102*  BUN 24*   < > '12 14 13  '$ CREATININE 1.83*   < > 1.56* 1.67* 1.65*  CALCIUM 9.2   < > 9.5 9.7 9.9  MG 1.7  --   --   --  1.6*  PHOS  --   --   --   --  2.6   < > =  values in this interval not displayed.   GFR: Estimated Creatinine Clearance: 26.1 mL/min (A) (by C-G formula based on SCr of 1.65 mg/dL (H)).  Liver Function Tests: Recent Labs  Lab 10/06/20 0507 10/08/20 0635 10/11/20 0051 10/11/20 0518  AST '15 17 19 19  '$ ALT '15 15 18 17  '$ ALKPHOS 65 69 80 74  BILITOT 0.4 0.3 0.4 0.4  PROT 5.7* 5.7* 6.6 6.0*  ALBUMIN 3.1* 2.9* 3.5 3.3*     Coagulation Profile: Recent Labs  Lab 10/11/20 0518  INR 1.1     Recent Results (from the past 240 hour(s))  Resp Panel by RT-PCR (Flu A&B, Covid) Nasopharyngeal Swab     Status: None   Collection Time: 10/05/20  9:33 PM   Specimen: Nasopharyngeal Swab; Nasopharyngeal(NP) swabs in vial transport medium  Result Value Ref Range Status   SARS Coronavirus 2 by RT PCR NEGATIVE NEGATIVE Final    Comment: (NOTE) SARS-CoV-2 target nucleic acids are NOT  DETECTED.  The SARS-CoV-2 RNA is generally detectable in upper respiratory specimens during the acute phase of infection. The lowest concentration of SARS-CoV-2 viral copies this assay can detect is 138 copies/mL. A negative result does not preclude SARS-Cov-2 infection and should not be used as the sole basis for treatment or other patient management decisions. A negative result may occur with  improper specimen collection/handling, submission of specimen other than nasopharyngeal swab, presence of viral mutation(s) within the areas targeted by this assay, and inadequate number of viral copies(<138 copies/mL). A negative result must be combined with clinical observations, patient history, and epidemiological information. The expected result is Negative.  Fact Sheet for Patients:  EntrepreneurPulse.com.au  Fact Sheet for Healthcare Providers:  IncredibleEmployment.be  This test is no t yet approved or cleared by the [place] FDA and  has been authorized for detection and/or diagnosis of SARS-CoV-2 by FDA under an Emergency Use Authorization (EUA). This EUA will remain  in effect (meaning this test can be used) for the duration of the COVID-19 declaration under Section 564(b)(1) of the Act, 21 U.S.C.section 360bbb-3(b)(1), unless the authorization is terminated  or revoked sooner.       Influenza A by PCR NEGATIVE NEGATIVE Final   Influenza B by PCR NEGATIVE NEGATIVE Final    Comment: (NOTE) The Xpert Xpress SARS-CoV-2/FLU/RSV plus assay is intended as an aid in the diagnosis of influenza from Nasopharyngeal swab specimens and should not be used as a sole basis for treatment. Nasal washings and aspirates are unacceptable for Xpert Xpress SARS-CoV-2/FLU/RSV testing.  Fact Sheet for Patients: EntrepreneurPulse.com.au  Fact Sheet for Healthcare Providers: IncredibleEmployment.be  This test is not yet  approved or cleared by the [place] FDA and has been authorized for detection and/or diagnosis of SARS-CoV-2 by FDA under an Emergency Use Authorization (EUA). This EUA will remain in effect (meaning this test can be used) for the duration of the COVID-19 declaration under Section 564(b)(1) of the Act, 21 U.S.C. section 360bbb-3(b)(1), unless the authorization is terminated or revoked.  Performed at [facility], [address], [place], [place] [address]   Culture, blood (routine x 2)     Status: None   Collection Time: 10/06/20 12:18 AM   Specimen: BLOOD RIGHT HAND  Result Value Ref Range Status   Specimen Description BLOOD RIGHT HAND  Final   Special Requests   Final    BOTTLES DRAWN AEROBIC AND ANAEROBIC Blood Culture adequate volume   Culture   Final    NO GROWTH 5 DAYS  Performed at [facility], [address] [place], [place] [address]    Report Status 10/11/2020 FINAL  Final  Culture, blood (routine x 2)     Status: None   Collection Time: 10/06/20 12:25 AM   Specimen: BLOOD LEFT HAND  Result Value Ref Range Status   Specimen Description BLOOD LEFT HAND  Final   Special Requests AEROBIC BOTTLE ONLY Blood Culture adequate volume  Final   Culture   Final    NO GROWTH 5 DAYS Performed at [facility], [address], [place], [place] [address]    Report Status 10/11/2020 FINAL  Final  Culture, fungus without smear     Status: None (Preliminary result)   Collection Time: 10/06/20  9:56 AM   Specimen: CSF; Other  Result Value Ref Range Status   Specimen Description CSF  Final   Special Requests NONE  Final   Culture   Final    NO GROWTH 4 DAYS Performed at [place] [address] [place], [place] [address]    Report Status PENDING  Incomplete  Anaerobic culture w Gram Stain     Status: None (Preliminary result)   Collection Time: 10/06/20  9:57 AM   Specimen: CSF  Result Value Ref Range Status   Specimen Description   Final    CSF Performed at [facility], [address] [place], [place] [address]    Special Requests   Final    NONE Performed at [facility], [address] [place], [place] [address]    Culture   Final    NO ANAEROBES ISOLATED; CULTURE IN PROGRESS FOR 5 DAYS   Report Status PENDING  Incomplete  CSF culture w Gram Stain     Status: None   Collection Time: 10/06/20 11:15 AM   Specimen: CSF; Cerebrospinal Fluid  Result Value Ref Range Status   Specimen Description   Final    CSF Performed at [facility], [address] [place], [place] [address]    Special Requests   Final    NONE Performed at [facility], [address], [place], [place] [address]    Gram Stain   Final    NO ORGANISMS SEEN NO WBC SEEN Performed at [facility], [address], [place], [place] [address]    Culture   Final    NO GROWTH 3 DAYS Performed at [facility], [place] [address] [place],  [address]    Report Status 10/10/2020 FINAL  Final     Scheduled Meds:  enoxaparin (LOVENOX) injection  30 mg Subcutaneous Q24H   melatonin  3 mg Oral QHS      LOS: 0 days   Cherene Altes, MD [facility] Office  [phone] Pager - Text Page per Shea Evans  If 7PM-7AM, please contact night-coverage per Amion 10/11/2020, 12:01 PM

## 2020-10-12 DIAGNOSIS — E876 Hypokalemia: Secondary | ICD-10-CM | POA: Diagnosis not present

## 2020-10-12 DIAGNOSIS — R4182 Altered mental status, unspecified: Secondary | ICD-10-CM | POA: Diagnosis present

## 2020-10-12 DIAGNOSIS — N184 Chronic kidney disease, stage 4 (severe): Secondary | ICD-10-CM | POA: Diagnosis present

## 2020-10-12 DIAGNOSIS — M1990 Unspecified osteoarthritis, unspecified site: Secondary | ICD-10-CM | POA: Diagnosis present

## 2020-10-12 DIAGNOSIS — F29 Unspecified psychosis not due to a substance or known physiological condition: Secondary | ICD-10-CM | POA: Diagnosis present

## 2020-10-12 DIAGNOSIS — Z20822 Contact with and (suspected) exposure to covid-19: Secondary | ICD-10-CM | POA: Diagnosis present

## 2020-10-12 DIAGNOSIS — Z880 Allergy status to penicillin: Secondary | ICD-10-CM | POA: Diagnosis not present

## 2020-10-12 DIAGNOSIS — I10 Essential (primary) hypertension: Secondary | ICD-10-CM | POA: Diagnosis not present

## 2020-10-12 DIAGNOSIS — G928 Other toxic encephalopathy: Secondary | ICD-10-CM | POA: Insufficient documentation

## 2020-10-12 DIAGNOSIS — G4700 Insomnia, unspecified: Secondary | ICD-10-CM | POA: Diagnosis present

## 2020-10-12 DIAGNOSIS — N289 Disorder of kidney and ureter, unspecified: Secondary | ICD-10-CM

## 2020-10-12 DIAGNOSIS — Z79899 Other long term (current) drug therapy: Secondary | ICD-10-CM | POA: Diagnosis not present

## 2020-10-12 DIAGNOSIS — E538 Deficiency of other specified B group vitamins: Secondary | ICD-10-CM | POA: Diagnosis present

## 2020-10-12 DIAGNOSIS — I959 Hypotension, unspecified: Secondary | ICD-10-CM | POA: Diagnosis not present

## 2020-10-12 DIAGNOSIS — Z87891 Personal history of nicotine dependence: Secondary | ICD-10-CM | POA: Diagnosis not present

## 2020-10-12 DIAGNOSIS — E669 Obesity, unspecified: Secondary | ICD-10-CM | POA: Diagnosis present

## 2020-10-12 DIAGNOSIS — Z885 Allergy status to narcotic agent status: Secondary | ICD-10-CM | POA: Diagnosis not present

## 2020-10-12 DIAGNOSIS — G9341 Metabolic encephalopathy: Secondary | ICD-10-CM | POA: Diagnosis present

## 2020-10-12 DIAGNOSIS — I129 Hypertensive chronic kidney disease with stage 1 through stage 4 chronic kidney disease, or unspecified chronic kidney disease: Secondary | ICD-10-CM | POA: Diagnosis present

## 2020-10-12 DIAGNOSIS — D631 Anemia in chronic kidney disease: Secondary | ICD-10-CM | POA: Diagnosis present

## 2020-10-12 DIAGNOSIS — Z6835 Body mass index (BMI) 35.0-35.9, adult: Secondary | ICD-10-CM | POA: Diagnosis not present

## 2020-10-12 DIAGNOSIS — M109 Gout, unspecified: Secondary | ICD-10-CM | POA: Diagnosis present

## 2020-10-12 DIAGNOSIS — I6783 Posterior reversible encephalopathy syndrome: Secondary | ICD-10-CM | POA: Diagnosis present

## 2020-10-12 LAB — COMPREHENSIVE METABOLIC PANEL
ALT: 14 U/L (ref 0–44)
AST: 17 U/L (ref 15–41)
Albumin: 2.9 g/dL — ABNORMAL LOW (ref 3.5–5.0)
Alkaline Phosphatase: 63 U/L (ref 38–126)
Anion gap: 6 (ref 5–15)
BUN: 15 mg/dL (ref 8–23)
CO2: 23 mmol/L (ref 22–32)
Calcium: 9.3 mg/dL (ref 8.9–10.3)
Chloride: 112 mmol/L — ABNORMAL HIGH (ref 98–111)
Creatinine, Ser: 1.79 mg/dL — ABNORMAL HIGH (ref 0.44–1.00)
GFR, Estimated: 29 mL/min — ABNORMAL LOW (ref >60)
Glucose, Bld: 91 mg/dL (ref 70–99)
Potassium: 3.4 mmol/L — ABNORMAL LOW (ref 3.5–5.1)
Sodium: 141 mmol/L (ref 135–145)
Total Bilirubin: 0.6 mg/dL (ref 0.3–1.2)
Total Protein: 5.3 g/dL — ABNORMAL LOW (ref 6.5–8.1)

## 2020-10-12 LAB — FOLATE: Folate: 7.3 ng/mL (ref >5.9)

## 2020-10-12 LAB — ANAEROBIC CULTURE W GRAM STAIN

## 2020-10-12 LAB — VITAMIN B12: Vitamin B-12: 107 pg/mL — ABNORMAL LOW (ref 180–914)

## 2020-10-12 LAB — MAGNESIUM: Magnesium: 1.9 mg/dL (ref 1.7–2.4)

## 2020-10-12 MED ORDER — CYANOCOBALAMIN 1000 MCG/ML IJ SOLN
1000.0000 ug | Freq: Every day | INTRAMUSCULAR | Status: DC
  Administered 2020-10-12 – 2020-10-16 (×5): 1000 ug via SUBCUTANEOUS
  Filled 2020-10-12 (×5): qty 1

## 2020-10-12 MED ORDER — QUETIAPINE FUMARATE 50 MG PO TABS
50.0000 mg | ORAL_TABLET | Freq: Every day | ORAL | Status: DC
  Administered 2020-10-12 – 2020-10-15 (×4): 50 mg via ORAL
  Filled 2020-10-12: qty 1
  Filled 2020-10-12 (×2): qty 2
  Filled 2020-10-12: qty 1

## 2020-10-12 MED ORDER — POTASSIUM CHLORIDE CRYS ER 20 MEQ PO TBCR
40.0000 meq | EXTENDED_RELEASE_TABLET | Freq: Once | ORAL | Status: AC
  Administered 2020-10-12: 40 meq via ORAL
  Filled 2020-10-12: qty 2

## 2020-10-12 MED ORDER — DIVALPROEX SODIUM 250 MG PO DR TAB
250.0000 mg | DELAYED_RELEASE_TABLET | Freq: Three times a day (TID) | ORAL | Status: AC
  Administered 2020-10-12: 250 mg via ORAL
  Filled 2020-10-12: qty 1

## 2020-10-12 MED ORDER — DILTIAZEM HCL ER COATED BEADS 120 MG PO CP24
240.0000 mg | ORAL_CAPSULE | Freq: Every day | ORAL | Status: DC
  Administered 2020-10-12 – 2020-10-16 (×5): 240 mg via ORAL
  Filled 2020-10-12 (×5): qty 2

## 2020-10-12 NOTE — Consult Note (Signed)
[place] A. Merlene Laughter, MD     [email or web address]          Allison Watson is an 80 y.o. female.   ASSESSMENT/PLAN: Subacute encephalopathy: The abnormality seen involving the pia mater right parietal t occipital region is concerning for malignancy given the negative CSF analysis for chronic meningitis.  Given the ongoing and progressive nature, I think a tissue diagnosis is warranted.  As result, I think the patient should be transferred to the [facility] system for biopsy.  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  Depakote will be added to help with the spells.  24-hour continuous video EEG is also recommended. Low vitamin B12 which is being replaced.    The patient was recently discharged after terrifying frightening hallucinations.  I did get a better history from the patient's daughter as the patient is currently confused as to why she is in the hospital.  She tells me that she does not know why she is in the hospital but that the hallucinations had resolved with the medications prescribed on her last appointment.  The daughter reports that the hallucinations indeed resolved but the patient was not herself and seem to have episodic disorientation, confusion and not recognizing people.  Recently, she seemed to have episodes of memory loss.  The family decided to bring the patient back to the hospital because of the worsening confusion and disorientation.  The daughter reports that her symptoms started  in October last year with a lot of hallucinations.  It appears that the hallucinations continued for a while and then resolved.  Apparently the hallucinations returned in February of this year and have gotten progressively worse.  The patient did have significant headaches and was a primary since she was seen in our office.  It  appears that the headaches have gotten better with Topamax but the hallucinations which initially resolved have returned with increasing frequency and intensity.  The patient does not report having a headache at this time.  She reports feeling well although she is obviously is confused as to her medical history and the cannot provide an adequate history.  The work-up was significant for elevated creatinine 1.67 and a low vitamin B12 which is being replaced.    GENERAL: She is resting well at this time.  HEENT: Neck is supple no trauma noted.  ABDOMEN: soft  EXTREMITIES: No edema   BACK: Normal  SKIN: Normal by inspection.    MENTAL STATUS: She is resting well but easily arousable.  She knows she is in the hospital and converses well but is confused regarding her medical history.  CRANIAL NERVES: Pupils are equal, round and reactive to light and accomodation; extra ocular movements are full, there is no significant nystagmus; visual fields are full; upper and lower facial muscles are normal in strength and symmetric, there is no flattening of the nasolabial folds; tongue is midline; uvula is midline; shoulder elevation is normal.  MOTOR: Normal tone, bulk and strength; no pronator drift.  COORDINATION: Left finger to nose is normal, right finger to nose is normal, No rest tremor; no intention tremor; no postural tremor; no bradykinesia.  REFLEXES: Deep tendon reflexes are symmetrical and normal.   SENSATION: Normal to light touch, temperature, and pain.                NEURO CONSULT 10-06-20  Terrifying and frightening visual hallucinations likely due to MRI findings especially abnormal signal/  enhancement of the p.m. on tele right parietal occipital region. The differential diagnosis includes chronic inflammatory or infectious etiology and malignancy.  Spinal fluid analysis initially is fine but additional results are pending. Cryptococcal antigen and the CSF  angiotensin-converting enzymes also added. Given the frightening nature of these hallucinations, neuroleptics will be added to suppress them.  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  Chronic headaches which actually are much better with Topamax and improvement in shoulder pain.     This is a 80 year old black female who presents with the progressive visual hallucination over last several months. The hallucinations were initially not disruptive or disturbing to the patient but they have become progressively terrifying the point of crying and being very afraid. She has to have people staying with her because of the fair associated with these hallucinations which are worse at nighttime. She reports them causing difficulty sleeping. She has mostly formed hallucinations including scary snakes, lizards and others scary animals. She also reports people were trying to hurt or harm her or make fun of her. She did have significant headaches but she reports the headaches have got better with the improvement of her shoulder pain. She also was placed on Topamax which seems to have helped. She denies any visual symptoms. She denies any symptoms involving the arms or legs such as numbness or weakness.  She reports being cognitively well for the most part without alteration of mentation although she seems to ramble a lot and have difficulty focusing. It is obvious that these hallucinations is very scary for the patient. The review systems otherwise negative.      Blood pressure 137/64, pulse 64, temperature 98 F (36.7 C), temperature source Oral, resp. rate 18, height '4\' 11"'$  (1.499 m), weight 80 kg, SpO2 100 %.  Past Medical History:  Diagnosis Date   Arthritis    Lt arm   Gout    Hypertension     Past Surgical History:  Procedure Laterality  Date   ABDOMINAL HYSTERECTOMY     TUBAL LIGATION      History reviewed. No pertinent family history.  Social History:  reports that she has quit smoking. She has never used smokeless tobacco. She reports that she does not drink alcohol and does not use drugs.  Allergies:  Allergies  Allergen Reactions   Codeine Rash   Lorazepam Other (See Comments)    Hallucinations per daughter [name]   Penicillins Rash    Has patient had a PCN reaction causing immediate rash, facial/tongue/throat swelling, SOB or lightheadedness with hypotension: Yes Has patient had a PCN reaction causing severe rash involving mucus membranes or skin necrosis: No Has patient had a PCN reaction that required hospitalization No Has patient had a PCN reaction occurring within the last 10 years: No If all of the above answers are "NO", then may proceed with Cephalosporin use.     Medications: Prior to Admission medications   Medication Sig Start Date End Date Taking? Authorizing Provider  albuterol (VENTOLIN HFA) 108 (90 Base) MCG/ACT inhaler Inhale 1-2 puffs into the lungs every 6 (six) hours as needed for wheezing or shortness of breath. Will use when too hot/ too cold outside   Yes [provider]  cholecalciferol (VITAMIN D3) 25 MCG (1000 UNIT) tablet Take 1,000 Units by mouth daily.  Yes [provider]  lisinopril-hydrochlorothiazide (ZESTORETIC) 20-12.5 MG tablet Take 1 tablet by mouth daily.   Yes [provider]  naproxen (NAPROSYN) 500 MG tablet Take 500 mg by mouth daily as needed (pain in arm).   Yes [provider]  oxyCODONE (OXY IR/ROXICODONE) 5 MG immediate release tablet Take 5 mg by mouth 4 (four) times daily as needed (pain in arm).   Yes [provider]  diltiazem (CARDIZEM CD) 240 MG 24 hr capsule Take 240 mg by mouth daily. 07/20/20   [provider]  hydrALAZINE (APRESOLINE) 100 MG tablet Take 1 tablet (100 mg total) by mouth 2 (two) times  daily. 10/09/20   Roxan Hockey, MD  labetalol (NORMODYNE) 100 MG tablet Take 1 tablet (100 mg total) by mouth 2 (two) times daily. 10/09/20   Roxan Hockey, MD  OLANZapine (ZYPREXA) 10 MG tablet Take 1 tablet (10 mg total) by mouth at bedtime. 10/09/20   Roxan Hockey, MD  topiramate (TOPAMAX) 100 MG tablet Take 1 tablet (100 mg total) by mouth 2 (two) times daily. 10/09/20   Roxan Hockey, MD    Scheduled Meds:  cholecalciferol  1,000 Units Oral Daily   cloNIDine  0.1 mg Oral TID   cyanocobalamin  1,000 mcg Subcutaneous Daily   diltiazem  240 mg Oral Daily   enoxaparin (LOVENOX) injection  30 mg Subcutaneous Q24H   labetalol  100 mg Oral BID   QUEtiapine  50 mg Oral QHS   Continuous Infusions: PRN Meds:.acetaminophen, haloperidol lactate     Results for orders placed or performed during the hospital encounter of 10/10/20 (from the past 48 hour(s))  Comprehensive metabolic panel     Status: Abnormal   Collection Time: 10/11/20 12:51 AM  Result Value Ref Range   Sodium 138 135 - 145 mmol/L   Potassium 3.8 3.5 - 5.1 mmol/L   Chloride 109 98 - 111 mmol/L   CO2 21 (L) 22 - 32 mmol/L   Glucose, Bld 111 (H) 70 - 99 mg/dL    Comment: Glucose reference range applies only to samples taken after fasting for at least 8 hours.   BUN 14 8 - 23 mg/dL   Creatinine, Ser 1.67 (H) 0.44 - 1.00 mg/dL   Calcium 9.7 8.9 - 10.3 mg/dL   Total Protein 6.6 6.5 - 8.1 g/dL   Albumin 3.5 3.5 - 5.0 g/dL   AST 19 15 - 41 U/L   ALT 18 0 - 44 U/L   Alkaline Phosphatase 80 38 - 126 U/L   Total Bilirubin 0.4 0.3 - 1.2 mg/dL   GFR, Estimated 31 (L) >60 mL/min    Comment: (NOTE) Calculated using the CKD-EPI Creatinine Equation (2021)    Anion gap 8 5 - 15    Comment: Performed at [facility], [address], [place], [place] [address]  CBC with Differential     Status: Abnormal   Collection Time: 10/11/20 12:51 AM  Result Value Ref Range   WBC 9.7 4.0 - 10.5 K/uL   RBC 4.45 3.87 - 5.11 MIL/uL    Hemoglobin 12.2 12.0 - 15.0 g/dL   HCT 38.2 36.0 - 46.0 %   MCV 85.8 80.0 - 100.0 fL   MCH 27.4 26.0 - 34.0 pg   MCHC 31.9 30.0 - 36.0 g/dL   RDW 16.9 (H) 11.5 - 15.5 %   Platelets 223 150 - 400 K/uL   nRBC 0.0 0.0 - 0.2 %   Neutrophils Relative % 87 %   Neutro  Abs 8.4 (H) 1.7 - 7.7 K/uL   Lymphocytes Relative 9 %   Lymphs Abs 0.8 0.7 - 4.0 K/uL   Monocytes Relative 3 %   Monocytes Absolute 0.3 0.1 - 1.0 K/uL   Eosinophils Relative 0 %   Eosinophils Absolute 0.0 0.0 - 0.5 K/uL   Basophils Relative 1 %   Basophils Absolute 0.1 0.0 - 0.1 K/uL   Immature Granulocytes 0 %   Abs Immature Granulocytes 0.03 0.00 - 0.07 K/uL    Comment: Performed at [facility], [address], [place], [place] [address]  Sedimentation rate     Status: None   Collection Time: 10/11/20 12:51 AM  Result Value Ref Range   Sed Rate 2 0 - 22 mm/hr    Comment: Performed at [facility], [address], [place], [place] [address]  Ethanol     Status: None   Collection Time: 10/11/20 12:52 AM  Result Value Ref Range   Alcohol, Ethyl (B) <10 <10 mg/dL    Comment: (NOTE) Lowest detectable limit for serum alcohol is 10 mg/dL.  For medical purposes only. Performed at [facility], [address] [place], [place] [address]   Urinalysis, Routine w reflex microscopic     Status: Abnormal   Collection Time: 10/11/20  1:53 AM  Result Value Ref Range   Color, Urine YELLOW YELLOW   APPearance HAZY (A) CLEAR   Specific Gravity, Urine 1.015 1.005 - 1.030   pH 5.0 5.0 - 8.0   Glucose, UA NEGATIVE NEGATIVE mg/dL   Hgb urine dipstick NEGATIVE NEGATIVE   Bilirubin Urine NEGATIVE NEGATIVE   Ketones, ur 20 (A) NEGATIVE mg/dL   Protein, ur NEGATIVE NEGATIVE mg/dL   Nitrite NEGATIVE NEGATIVE   Leukocytes,Ua NEGATIVE NEGATIVE    Comment: Performed at [facility], [address], [place], [place] [address]  Urine rapid drug screen (hosp performed)     Status: None   Collection Time: 10/11/20  1:53 AM  Result Value  Ref Range   Opiates NONE DETECTED NONE DETECTED   Cocaine NONE DETECTED NONE DETECTED   Benzodiazepines NONE DETECTED NONE DETECTED   Amphetamines NONE DETECTED NONE DETECTED   Tetrahydrocannabinol NONE DETECTED NONE DETECTED   Barbiturates NONE DETECTED NONE DETECTED    Comment: (NOTE) DRUG SCREEN FOR MEDICAL PURPOSES ONLY.  IF CONFIRMATION IS NEEDED FOR ANY PURPOSE, NOTIFY LAB WITHIN 5 DAYS.  LOWEST DETECTABLE LIMITS FOR URINE DRUG SCREEN Drug Class                     Cutoff (ng/mL) Amphetamine and metabolites    1000 Barbiturate and metabolites    200 Benzodiazepine                 [id number] Tricyclics and metabolites     300 Opiates and metabolites        300 Cocaine and metabolites        300 THC                            50 Performed at [facility], [address], [place], [place] [address]   SARS CORONAVIRUS 2 (TAT 6-24 HRS) Nasopharyngeal Nasopharyngeal Swab     Status: None   Collection Time: 10/11/20  3:00 AM   Specimen: Nasopharyngeal Swab  Result Value Ref Range   SARS Coronavirus 2 NEGATIVE NEGATIVE    Comment: (NOTE) SARS-CoV-2 target nucleic acids are NOT DETECTED.  The SARS-CoV-2 RNA  is generally detectable in upper and lower respiratory specimens during the acute phase of infection. Negative results do not preclude SARS-CoV-2 infection, do not rule out co-infections with other pathogens, and should not be used as the sole basis for treatment or other patient management decisions. Negative results must be combined with clinical observations, patient history, and epidemiological information. The expected result is Negative.  Fact Sheet for Patients: SugarRoll.be  Fact Sheet for Healthcare Providers: https://www.woods-mathews.com/  This test is not yet approved or cleared by the [place] FDA and  has been authorized for detection and/or diagnosis of SARS-CoV-2 by FDA under an Emergency Use Authorization (EUA).  This EUA will remain  in effect (meaning this test can be used) for the duration of the COVID-19 declaration under Se ction 564(b)(1) of the Act, 21 U.S.C. section 360bbb-3(b)(1), unless the authorization is terminated or revoked sooner.  Performed at [facility], [place] [address] [place], [place] [address]   Comprehensive metabolic panel     Status: Abnormal   Collection Time: 10/11/20  5:18 AM  Result Value Ref Range   Sodium 139 135 - 145 mmol/L   Potassium 3.9 3.5 - 5.1 mmol/L   Chloride 109 98 - 111 mmol/L   CO2 21 (L) 22 - 32 mmol/L   Glucose, Bld 102 (H) 70 - 99 mg/dL    Comment: Glucose reference range applies only to samples taken after fasting for at least 8 hours.   BUN 13 8 - 23 mg/dL   Creatinine, Ser 1.65 (H) 0.44 - 1.00 mg/dL   Calcium 9.9 8.9 - 10.3 mg/dL   Total Protein 6.0 (L) 6.5 - 8.1 g/dL   Albumin 3.3 (L) 3.5 - 5.0 g/dL   AST 19 15 - 41 U/L   ALT 17 0 - 44 U/L   Alkaline Phosphatase 74 38 - 126 U/L   Total Bilirubin 0.4 0.3 - 1.2 mg/dL   GFR, Estimated 32 (L) >60 mL/min    Comment: (NOTE) Calculated using the CKD-EPI Creatinine Equation (2021)    Anion gap 9 5 - 15    Comment: Performed at [facility], [address], [place], [place] [address]  CBC     Status: Abnormal   Collection Time: 10/11/20  5:18 AM  Result Value Ref Range   WBC 7.6 4.0 - 10.5 K/uL   RBC 4.33 3.87 - 5.11 MIL/uL   Hemoglobin 11.7 (L) 12.0 - 15.0 g/dL   HCT 37.1 36.0 - 46.0 %   MCV 85.7 80.0 - 100.0 fL   MCH 27.0 26.0 - 34.0 pg   MCHC 31.5 30.0 - 36.0 g/dL   RDW 16.8 (H) 11.5 - 15.5 %   Platelets 221 150 - 400 K/uL   nRBC 0.0 0.0 - 0.2 %    Comment: Performed at [facility], [address] [place], [place] [address]  Protime-INR     Status: None   Collection Time: 10/11/20  5:18 AM  Result Value Ref Range   Prothrombin Time 14.6 11.4 - 15.2 seconds   INR 1.1 0.8 - 1.2    Comment: (NOTE) INR goal varies based on device and disease states. Performed at [facility], [address], [place], [place] [address]   APTT     Status: None   Collection Time: 10/11/20  5:18 AM  Result Value Ref Range   aPTT 24 24 - 36 seconds    Comment: Performed at [facility], [address] [place], [place] [address]  Magnesium     Status: Abnormal   Collection Time: 10/11/20  5:18 AM  Result Value Ref Range   Magnesium 1.6 (L) 1.7 - 2.4 mg/dL    Comment: Performed at [facility], [address] [place], [place] [address]  Phosphorus     Status: None   Collection Time: 10/11/20  5:18 AM  Result Value Ref Range   Phosphorus 2.6 2.5 - 4.6 mg/dL    Comment: Performed at [facility], [address], [place], [place] [address]  Vitamin B12     Status: Abnormal   Collection Time: 10/12/20  4:39 AM  Result Value Ref Range   Vitamin B-12 107 (L) 180 - 914 pg/mL    Comment: (NOTE) This assay is not validated for testing neonatal or myeloproliferative syndrome specimens for Vitamin B12 levels. Performed at [facility], [address] [place], [place] [address]   Folate     Status: None   Collection Time: 10/12/20  4:39 AM  Result Value Ref Range   Folate 7.3 >5.9 ng/mL    Comment: Performed at [facility], [address] [place], [place] [address]  Magnesium     Status: None   Collection Time: 10/12/20  4:39 AM  Result Value Ref Range   Magnesium 1.9 1.7 - 2.4 mg/dL    Comment: Performed at [facility], [address], [place], [place] [address]  Comprehensive metabolic panel     Status: Abnormal   Collection Time: 10/12/20  4:39 AM  Result Value Ref Range   Sodium 141 135 - 145 mmol/L   Potassium 3.4 (L) 3.5 - 5.1 mmol/L   Chloride 112 (H) 98 - 111 mmol/L   CO2 23 22 - 32 mmol/L   Glucose, Bld 91 70 - 99 mg/dL    Comment: Glucose reference range applies only to samples taken after fasting for at least 8 hours.   BUN 15 8 - 23 mg/dL   Creatinine, Ser 1.79 (H) 0.44 - 1.00 mg/dL   Calcium 9.3 8.9 - 10.3 mg/dL   Total Protein 5.3 (L) 6.5 - 8.1 g/dL   Albumin  2.9 (L) 3.5 - 5.0 g/dL   AST 17 15 - 41 U/L   ALT 14 0 - 44 U/L   Alkaline Phosphatase 63 38 - 126 U/L   Total Bilirubin 0.6 0.3 - 1.2 mg/dL   GFR, Estimated 29 (L) >60 mL/min    Comment: (NOTE) Calculated using the CKD-EPI Creatinine Equation (2021)    Anion gap 6 5 - 15    Comment: Performed at [facility], [address], [place],  [address]    Studies/Results:     Dejha King A. Merlene Laughter, M.D.  Diplomate, Tax adviser of Psychiatry and Neurology ( Neurology). 10/12/2020, 6:38 PM

## 2020-10-12 NOTE — Care Management Obs Status (Signed)
[place] NOTIFICATION   Patient Details  Name: Allison Watson MRN: [id number] Date of Birth: Dec 18, 1940   Medicare Observation Status Notification Given:  Yes    Salome Arnt, LCSW 10/12/2020, 9:04 AM

## 2020-10-12 NOTE — Progress Notes (Addendum)
Allison Watson  [id number] DOB: [birth year] DOA: 10/10/2020 PCP: Lucia Gaskins, MD    Brief Narrative:  80 year old with a history of insomnia, chronic pain, HTN, gout, and arthritis who presented to the ED with family who reported AMS x24 hours.  The patient required admission 6/14-6/18/2022 with acute metabolic encephalopathy to include hallucinations.  At the time this was felt to be related to sleep medication.  Though she initially improved following her discharge 6/18, her symptoms recurred rapidly and she was brought back to the ED.  Significant Events:  6/14-6/18 admit to hospital for AMS with extensive work-up 6/20 return to ED with recurrence of symptoms for readmission  Consultants:  Neurology requested but not yet visited  Code Status: FULL CODE  Antimicrobials:  None  DVT prophylaxis: Lovenox  Subjective: Afebrile.  Blood pressure stable.  Saturation 96% on room air.  Renal function worsening.  B12 severely low at 107.  Patient remains severely agitated with active hallucinations throughout most of yesterday and required near constant care to assure for her own safety.  I spoke with the patient's daughter via phone who appears to be her primary caregiver.  She confirms that at her baseline the patient is fully independent and functional and has a full-time job and is able to live alone without any difficulty.  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  Her current behavior remains severely abnormal.  Assessment & Plan:  Altered mental status -hallucinations Extensive work-up during recent hospital stay included lumbar puncture, MRI brain, and EEG - Neurology re-consulted at time of admission but has not yet seen patient - UDS negative - UA suggestive of dehydration but not UTI - B12 severely deficient - ?component of PRES per prior eval/MRI  Severe B12 deficiency B12 107 - begin daily 1000 mcg supplementation -likely a significant  contributor to her AMS  HTN Clonidine added yesterday -trend overall improved -follow without change for now  Hypomagnesemia Corrected with supplementation  Mild hypokalemia Due to poor intake -supplement and follow  CKD stage III Baseline creatinine approximately 1.65 -creatinine trending up at present -follow in serial fashion  Normocytic anemia Due to CKD  Insomnia Likely contributing to her AMS - low-dose trial of Seroquel during hospital stay  Obesity - Body mass index is 35.62 kg/m.  Disposition Continue B12 supplementation -patient is clearly not safe to live on her own at present -may require another 2 to 3 days to adequately load with B12 and to see significant improvement if this is the primary issue  Family Communication: Spoke with daughter via phone Status is: Observation  The patient remains OBS appropriate and will d/c before 2 midnights.  Dispo: The patient is from: Home              Anticipated d/c is to:  Unclear              Patient currently is not medically stable to d/c.   Difficult to place patient No   Objective: Blood pressure 140/71, pulse 68, temperature 98.4 F (36.9 C), resp. rate 18, height '4\' 11"'$  (1.499 m), weight 80 kg, SpO2 96 %. No intake or output data in the 24 hours ending 10/12/20 1151  Filed Weights   10/10/20 2254  Weight: 80 kg    Examination: General: No acute respiratory distress - alert but confused - can't tell me where she is or why she is here - 5/5 strenght B U&LE - CN 2-12 intact B -  speech clear  Lungs: Clear to auscultation bilaterally without wheezes or crackles Cardiovascular: Regular rate and rhythm without murmur gallop or rub normal S1 and S2 Abdomen: Nontender, nondistended, soft, bowel sounds positive, no rebound, no ascites, no appreciable mass Extremities: No significant cyanosis, clubbing, or edema bilateral lower extremities   CBC: Recent Labs  Lab 10/06/20 0018 10/06/20 0507 10/08/20 0635  10/11/20 0051 10/11/20 0518  WBC 5.6 6.0 4.9 9.7 7.6  NEUTROABS 3.0 3.1  --  8.4*  --   HGB 12.4 12.2 11.4* 12.2 11.7*  HCT 38.8 39.2 36.5 38.2 37.1  MCV 85.8 87.9 87.3 85.8 85.7  PLT 214 193 191 223 [id number]    Basic Metabolic Panel: Recent Labs  Lab 10/06/20 0507 10/08/20 0635 10/11/20 0051 10/11/20 0518 10/12/20 0439  NA 139   < > 138 139 141  K 3.7   < > 3.8 3.9 3.4*  CL 107   < > 109 109 112*  CO2 26   < > 21* 21* 23  GLUCOSE 99   < > 111* 102* 91  BUN 24*   < > '14 13 15  '$ CREATININE 1.83*   < > 1.67* 1.65* 1.79*  CALCIUM 9.2   < > 9.7 9.9 9.3  MG 1.7  --   --  1.6* 1.9  PHOS  --   --   --  2.6  --    < > = values in this interval not displayed.    GFR: Estimated Creatinine Clearance: 24.1 mL/min (A) (by C-G formula based on SCr of 1.79 mg/dL (H)).  Liver Function Tests: Recent Labs  Lab 10/08/20 0635 10/11/20 0051 10/11/20 0518 10/12/20 0439  AST '17 19 19 17  '$ ALT '15 18 17 14  '$ ALKPHOS 69 80 74 63  BILITOT 0.3 0.4 0.4 0.6  PROT 5.7* 6.6 6.0* 5.3*  ALBUMIN 2.9* 3.5 3.3* 2.9*      Coagulation Profile: Recent Labs  Lab 10/11/20 0518  INR 1.1      Recent Results (from the past 240 hour(s))  Resp Panel by RT-PCR (Flu A&B, Covid) Nasopharyngeal Swab     Status: None   Collection Time: 10/05/20  9:33 PM   Specimen: Nasopharyngeal Swab; Nasopharyngeal(NP) swabs in vial transport medium  Result Value Ref Range Status   SARS Coronavirus 2 by RT PCR NEGATIVE NEGATIVE Final    Comment: (NOTE) SARS-CoV-2 target nucleic acids are NOT DETECTED.  The SARS-CoV-2 RNA is generally detectable in upper respiratory specimens during the acute phase of infection. The lowest concentration of SARS-CoV-2 viral copies this assay can detect is 138 copies/mL. A negative result does not preclude SARS-Cov-2 infection and should not be used as the sole basis for treatment or other patient management decisions. A negative result may occur with  improper specimen  collection/handling, submission of specimen other than nasopharyngeal swab, presence of viral mutation(s) within the areas targeted by this assay, and inadequate number of viral copies(<138 copies/mL). A negative result must be combined with clinical observations, patient history, and epidemiological information. The expected result is Negative.  Fact Sheet for Patients:  EntrepreneurPulse.com.au  Fact Sheet for Healthcare Providers:  IncredibleEmployment.be  This test is no t yet approved or cleared by the [place] FDA and  has been authorized for detection and/or diagnosis of SARS-CoV-2 by FDA under an Emergency Use Authorization (EUA). This EUA will remain  in effect (meaning this test can be used) for the duration of the COVID-19 declaration under Section 564(b)(1) of the Act,  21 U.S.C.section 360bbb-3(b)(1), unless the authorization is terminated  or revoked sooner.       Influenza A by PCR NEGATIVE NEGATIVE Final   Influenza B by PCR NEGATIVE NEGATIVE Final    Comment: (NOTE) The Xpert Xpress SARS-CoV-2/FLU/RSV plus assay is intended as an aid in the diagnosis of influenza from Nasopharyngeal swab specimens and should not be used as a sole basis for treatment. Nasal washings and aspirates are unacceptable for Xpert Xpress SARS-CoV-2/FLU/RSV testing.  Fact Sheet for Patients: EntrepreneurPulse.com.au  Fact Sheet for Healthcare Providers: IncredibleEmployment.be  This test is not yet approved or cleared by the [place] FDA and has been authorized for detection and/or diagnosis of SARS-CoV-2 by FDA under an Emergency Use Authorization (EUA). This EUA will remain in effect (meaning this test can be used) for the duration of the COVID-19 declaration under Section 564(b)(1) of the Act, 21 U.S.C. section 360bbb-3(b)(1), unless the authorization is terminated or revoked.  Performed at [facility], [address], [place], [place] [address]   Culture, blood (routine x 2)     Status: None   Collection Time: 10/06/20 12:18 AM   Specimen: BLOOD RIGHT HAND  Result Value Ref Range Status   Specimen Description BLOOD RIGHT HAND  Final   Special Requests   Final    BOTTLES DRAWN AEROBIC AND ANAEROBIC Blood Culture adequate volume   Culture   Final    NO GROWTH 5 DAYS Performed at [facility], [address] [place], [place] [address]    Report Status 10/11/2020 FINAL  Final  Culture, blood (routine x 2)     Status: None   Collection Time: 10/06/20 12:25 AM   Specimen: BLOOD LEFT HAND  Result Value Ref Range Status   Specimen Description BLOOD LEFT HAND  Final   Special Requests AEROBIC BOTTLE ONLY Blood Culture adequate volume  Final   Culture   Final    NO GROWTH 5 DAYS Performed at [facility], [address], [place], [place] [address]    Report Status 10/11/2020 FINAL  Final  Culture, fungus without smear     Status: None (Preliminary result)   Collection Time: 10/06/20  9:56 AM   Specimen: CSF; Other  Result Value Ref Range Status   Specimen Description CSF  Final   Special Requests NONE  Final   Culture   Final    NO FUNGUS ISOLATED AFTER 5 DAYS Performed at [facility], [address], [place], [place] [address]    Report Status PENDING  Incomplete  Anaerobic culture w Gram Stain     Status: None (Preliminary result)   Collection Time: 10/06/20  9:57 AM   Specimen: CSF  Result Value Ref Range Status   Specimen Description   Final    CSF Performed at [facility], [address], [place], [place] [address]    Special Requests   Final    NONE Performed at [facility], [address] [place], [place] [address]    Culture   Final    NO ANAEROBES ISOLATED; CULTURE IN PROGRESS FOR 5 DAYS   Report Status PENDING  Incomplete  CSF culture w Gram Stain     Status: None   Collection Time: 10/06/20 11:15 AM   Specimen: CSF; Cerebrospinal Fluid  Result  Value Ref Range Status   Specimen Description   Final    CSF Performed at [facility], [address], [place], [place] [address]    Special Requests  Final    NONE Performed at [facility], [address], [place], [place] [address]    Gram Stain   Final    NO ORGANISMS SEEN NO WBC SEEN Performed at [facility], [address] [place], [place] [address]    Culture   Final    NO GROWTH 3 DAYS Performed at [facility], [place] [address], [place], [place] [address]    Report Status 10/10/2020 FINAL  Final  SARS CORONAVIRUS 2 (TAT 6-24 HRS) Nasopharyngeal Nasopharyngeal Swab     Status: None   Collection Time: 10/11/20  3:00 AM   Specimen: Nasopharyngeal Swab  Result Value Ref Range Status   SARS Coronavirus 2 NEGATIVE NEGATIVE Final    Comment: (NOTE) SARS-CoV-2 target nucleic acids are NOT DETECTED.  The SARS-CoV-2 RNA is generally detectable in upper and lower respiratory specimens during the acute phase of infection. Negative results do not preclude SARS-CoV-2 infection, do not rule out co-infections with other pathogens, and should not be used as the sole basis for treatment or other patient management decisions. Negative results must be combined with clinical observations, patient history, and epidemiological information. The expected result is Negative.  Fact Sheet for Patients: SugarRoll.be  Fact Sheet for Healthcare Providers: https://www.woods-mathews.com/  This test is not yet approved or cleared by the [place] FDA and  has been authorized for detection and/or diagnosis of SARS-CoV-2 by FDA under an Emergency Use Authorization (EUA). This EUA will remain  in effect (meaning this test can be used) for the duration of the COVID-19 declaration under Se ction 564(b)(1) of the Act, 21 U.S.C. section 360bbb-3(b)(1), unless the authorization is terminated or revoked sooner.  Performed at [facility],  [place] [address] [place],  [address]       Scheduled Meds:  cholecalciferol  1,000 Units Oral Daily   cloNIDine  0.1 mg Oral TID   enoxaparin (LOVENOX) injection  30 mg Subcutaneous Q24H   labetalol  100 mg Oral BID   QUEtiapine  25 mg Oral QHS   topiramate  100 mg Oral BID      LOS: 0 days   Cherene Altes, MD [facility] Office  [phone] Pager - Text Page per Shea Evans  If 7PM-7AM, please contact night-coverage per Amion 10/12/2020, 11:51 AM

## 2020-10-13 DIAGNOSIS — N184 Chronic kidney disease, stage 4 (severe): Secondary | ICD-10-CM

## 2020-10-13 LAB — BASIC METABOLIC PANEL
Anion gap: 5 (ref 5–15)
BUN: 15 mg/dL (ref 8–23)
CO2: 22 mmol/L (ref 22–32)
Calcium: 8.9 mg/dL (ref 8.9–10.3)
Chloride: 112 mmol/L — ABNORMAL HIGH (ref 98–111)
Creatinine, Ser: 1.82 mg/dL — ABNORMAL HIGH (ref 0.44–1.00)
GFR, Estimated: 28 mL/min — ABNORMAL LOW (ref >60)
Glucose, Bld: 100 mg/dL — ABNORMAL HIGH (ref 70–99)
Potassium: 3.8 mmol/L (ref 3.5–5.1)
Sodium: 139 mmol/L (ref 135–145)

## 2020-10-13 NOTE — Progress Notes (Signed)
Allison Forest A. Merlene Laughter, MD     [email or web address]          Allison Watson is an 80 y.o. female.   ASSESSMENT/PLAN: Subacute encephalopathy: The abnormality seen involving the pia mater right parietal t occipital region is concerning for malignancy given the negative CSF analysis for chronic meningitis.  Given the ongoing and progressive nature, I think a tissue diagnosis is warranted.  As result, I think the patient should be transferred to the [facility] system for biopsy.  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  Depakote will be added to help with the spells.  24-hour continuous video EEG is also recommended. Low vitamin B12 which is being replaced.     The patient's family is in the room today. She seems less restless and more lucid and coherent. She is eating. Daughter reports that she still has intermittent confusion. Transfers being arranged to [facility] for further care including 24 hour EEG and biopsy.   GENERAL: She is resting well at this time.  HEENT: Neck is supple no trauma noted.  EXTREMITIES: No edema   BACK: Normal  SKIN: Normal by inspection.    MENTAL STATUS:  She is awake and coherent at this point.                 NEURO CONSULT 10-06-20 Terrifying and frightening visual hallucinations likely due to MRI findings especially abnormal signal/  enhancement of the p.m. on tele right parietal occipital region. The differential diagnosis includes chronic inflammatory or infectious etiology and malignancy.  Spinal fluid analysis initially is fine but additional results are pending. Cryptococcal antigen and the CSF angiotensin-converting enzymes also added. Given the frightening nature of these hallucinations, neuroleptics will be added to suppress them.  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  Chronic headaches which actually are much better with Topamax and improvement in shoulder pain.     This is a 80 year old black female who presents with the progressive visual hallucination over last several months. The hallucinations were initially not disruptive or disturbing to the patient but they have become progressively terrifying the point of crying and being very afraid. She has to have people staying with her because of the fair associated with these hallucinations which are worse at nighttime. She reports them causing difficulty sleeping. She has mostly formed hallucinations including scary snakes, lizards and others scary animals. She also reports people were trying to hurt or harm her or make fun of her. She did have significant headaches but she reports the headaches have got better with the improvement of her shoulder pain. She also was placed on Topamax which seems to have helped. She denies any visual symptoms. She denies any symptoms involving the arms or legs such as numbness or weakness.  She reports being cognitively well for the most part without alteration of mentation although she seems to ramble a lot and have difficulty focusing. It is obvious that these hallucinations is very scary for the patient. The review systems otherwise negative.      Blood pressure (!) 99/50, pulse (!) 56, temperature 98 F (36.7 C), temperature source Oral, resp. rate 18, height '4\' 11"'$  (1.499 m), weight 80 kg, SpO2 100 %.  Past Medical History:  Diagnosis  Date   Arthritis    Lt arm   Gout    Hypertension     Past Surgical History:  Procedure Laterality Date   ABDOMINAL HYSTERECTOMY     TUBAL LIGATION      History reviewed. No pertinent family history.  Social History:  reports that she has quit smoking. She has  never used smokeless tobacco. She reports that she does not drink alcohol and does not use drugs.  Allergies:  Allergies  Allergen Reactions   Codeine Rash   Lorazepam Other (See Comments)    Hallucinations per daughter Allison Watson   Penicillins Rash    Has patient had a PCN reaction causing immediate rash, facial/tongue/throat swelling, SOB or lightheadedness with hypotension: Yes Has patient had a PCN reaction causing severe rash involving mucus membranes or skin necrosis: No Has patient had a PCN reaction that required hospitalization No Has patient had a PCN reaction occurring within the last 10 years: No If all of the above answers are "NO", then may proceed with Cephalosporin use.     Medications: Prior to Admission medications   Medication Sig Start Date End Date Taking? Authorizing Provider  albuterol (VENTOLIN HFA) 108 (90 Base) MCG/ACT inhaler Inhale 1-2 puffs into the lungs every 6 (six) hours as needed for wheezing or shortness of breath. Will use when too hot/ too cold outside   Yes [provider]  cholecalciferol (VITAMIN D3) 25 MCG (1000 UNIT) tablet Take 1,000 Units by mouth daily.   Yes [provider]  lisinopril-hydrochlorothiazide (ZESTORETIC) 20-12.5 MG tablet Take 1 tablet by mouth daily.   Yes [provider]  naproxen (NAPROSYN) 500 MG tablet Take 500 mg by mouth daily as needed (pain in arm).   Yes [provider]  oxyCODONE (OXY IR/ROXICODONE) 5 MG immediate release tablet Take 5 mg by mouth 4 (four) times daily as needed (pain in arm).   Yes [provider]  diltiazem (CARDIZEM CD) 240 MG 24 hr capsule Take 240 mg by mouth daily. 07/20/20   [provider]  hydrALAZINE (APRESOLINE) 100 MG tablet Take 1 tablet (100 mg total) by mouth 2 (two) times daily. 10/09/20   Roxan Hockey, MD  labetalol (NORMODYNE) 100 MG tablet Take 1 tablet (100 mg total) by mouth 2 (two) times daily. 10/09/20   Roxan Hockey, MD   OLANZapine (ZYPREXA) 10 MG tablet Take 1 tablet (10 mg total) by mouth at bedtime. 10/09/20   Roxan Hockey, MD  topiramate (TOPAMAX) 100 MG tablet Take 1 tablet (100 mg total) by mouth 2 (two) times daily. 10/09/20   Roxan Hockey, MD    Scheduled Meds:  cholecalciferol  1,000 Units Oral Daily   cloNIDine  0.1 mg Oral TID   cyanocobalamin  1,000 mcg Subcutaneous Daily   diltiazem  240 mg Oral Daily   enoxaparin (LOVENOX) injection  30 mg Subcutaneous Q24H   labetalol  100 mg Oral BID   QUEtiapine  50 mg Oral QHS   Continuous Infusions: PRN Meds:.acetaminophen, haloperidol lactate     Results for orders placed or performed during the hospital encounter of 10/10/20 (from the past 48 hour(s))  Vitamin B12     Status: Abnormal   Collection Time: 10/12/20  4:39 AM  Result Value Ref Range   Vitamin B-12 107 (L) 180 - 914 pg/mL    Comment: (NOTE) This assay is not validated for testing neonatal or myeloproliferative syndrome specimens for Vitamin B12 levels. Performed at [facility], [address] [place],  [place] [address]   Folate     Status: None   Collection Time: 10/12/20  4:39 AM  Result Value Ref Range   Folate 7.3 >5.9 ng/mL    Comment: Performed at [facility], [address] [place], [place] [address]  Magnesium     Status: None   Collection Time: 10/12/20  4:39 AM  Result Value Ref Range   Magnesium 1.9 1.7 - 2.4 mg/dL    Comment: Performed at [facility], [address], [place], [place] [address]  Comprehensive metabolic panel     Status: Abnormal   Collection Time: 10/12/20  4:39 AM  Result Value Ref Range   Sodium 141 135 - 145 mmol/L   Potassium 3.4 (L) 3.5 - 5.1 mmol/L   Chloride 112 (H) 98 - 111 mmol/L   CO2 23 22 - 32 mmol/L   Glucose, Bld 91 70 - 99 mg/dL    Comment: Glucose reference range applies only to samples taken after fasting for at least 8 hours.   BUN 15 8 - 23 mg/dL   Creatinine, Ser 1.79 (H) 0.44 - 1.00 mg/dL   Calcium 9.3 8.9 -  10.3 mg/dL   Total Protein 5.3 (L) 6.5 - 8.1 g/dL   Albumin 2.9 (L) 3.5 - 5.0 g/dL   AST 17 15 - 41 U/L   ALT 14 0 - 44 U/L   Alkaline Phosphatase 63 38 - 126 U/L   Total Bilirubin 0.6 0.3 - 1.2 mg/dL   GFR, Estimated 29 (L) >60 mL/min    Comment: (NOTE) Calculated using the CKD-EPI Creatinine Equation (2021)    Anion gap 6 5 - 15    Comment: Performed at [facility], [address] [place], [place] [id number]  Basic metabolic panel     Status: Abnormal   Collection Time: 10/13/20  5:54 AM  Result Value Ref Range   Sodium 139 135 - 145 mmol/L   Potassium 3.8 3.5 - 5.1 mmol/L   Chloride 112 (H) 98 - 111 mmol/L   CO2 22 22 - 32 mmol/L   Glucose, Bld 100 (H) 70 - 99 mg/dL    Comment: Glucose reference range applies only to samples taken after fasting for at least 8 hours.   BUN 15 8 - 23 mg/dL   Creatinine, Ser 1.82 (H) 0.44 - 1.00 mg/dL   Calcium 8.9 8.9 - 10.3 mg/dL   GFR, Estimated 28 (L) >60 mL/min    Comment: (NOTE) Calculated using the CKD-EPI Creatinine Equation (2021)    Anion gap 5 5 - 15    Comment: Performed at [facility], [address], [place], [place] [address]    Studies/Results:     Tiah Heckel A. Merlene Watson, M.D.  Diplomate, Tax adviser of Psychiatry and Neurology ( Neurology). 10/13/2020, 6:55 PM

## 2020-10-13 NOTE — Evaluation (Signed)
Occupational Therapy Evaluation Patient Details Name: Allison Watson MRN: [id number] DOB: 10/16/1940 Today's Date: 10/13/2020    History of Present Illness Allison Watson is a 80 y.o. female with medical history significant for  insomnia, chronic pain, hypertension, gout, arthritis who presents to the emergency department accompanied by daughter due to altered mental status which started yesterday.  Patient was recently admitted from 6/14-6/18 due to acute metabolic encephalopathy and hallucination.  Patient reports seeing people, lizards, snakes and other animals coming out of the walls.  Patient states that she was prescribed sleep medication due to insomnia about 2 months ago, however, the medication appeared to make her to be hallucinating, so she stopped it and this was changed with another sleep medication which she also stopped taking due to same hallucinating effect, after the medication was being changed the 3rd time due to same hallucinations, patient started to take melatonin which appear to have less side effect.  Daughter at bedside states that patient showed some improvement when she first returned home from hospital, she states that she left patient at home for a few hours and when she returned, the house was in disarray and patient's mentation has markedly altered.  She also states that patient's  gait changed as she noted that she was taking very small steps (festinating gait), she was worried, so she activated EMS and patient was taken to the ED for further evaluation and management.   Clinical Impression   Pt functioning at baseline levels. WNL UE strength with difficulty noted with sequential finger touching likely due to difficulty following commands. Pt was then able to open and close a container without difficulty. Pt will be discharged to care of nursing for the duration of pt's stay.     Follow Up Recommendations  No OT follow up    Equipment Recommendations  None  recommended by OT           Precautions / Restrictions Precautions Precautions: None Restrictions Weight Bearing Restrictions: No      Mobility Bed Mobility Overal bed mobility: Modified Independent                  Transfers Overall transfer level: Modified independent                    Balance Overall balance assessment: No apparent balance deficits (not formally assessed)                                         ADL either performed or assessed with clinical judgement   ADL Overall ADL's : Modified independent                                       General ADL Comments: Able to don shoe seated at EOB.     Vision Baseline Vision/History: Wears glasses Wears Glasses: At all times Patient Visual Report: No change from baseline                  Pertinent Vitals/Pain Pain Assessment: No/denies pain     Hand Dominance Right   Extremity/Trunk Assessment Upper Extremity Assessment Upper Extremity Assessment: Overall WFL for tasks assessed (5/5 MMT grossly with the exception of L shoulder abduction at 4+/5 MMT.)   Lower Extremity Assessment Lower Extremity Assessment:  Defer to PT evaluation   Cervical / Trunk Assessment Cervical / Trunk Assessment: Normal   Communication Communication Communication: No difficulties   Cognition Arousal/Alertness: Awake/alert Behavior During Therapy: WFL for tasks assessed/performed Overall Cognitive Status: Within Functional Limits for tasks assessed                                                      Home Living Family/patient expects to be discharged to:: Private residence Living Arrangements: Alone Available Help at Discharge: Family;Available PRN/intermittently Type of Home: Apartment Home Access: Level entry     Home Layout: One level     Bathroom Shower/Tub: Teacher, early years/pre: Standard Bathroom Accessibility: Yes How  Accessible: Accessible via wheelchair;Accessible via walker Home Equipment: None          Prior Functioning/Environment Level of Independence: Independent        Comments: community ambulator, drives, works as a Physicist, medical goals can be found in the care plan section) Acute Rehab OT Goals Patient Stated Goal: return home with family to assist             Co-evaluation PT/OT/SLP Co-Evaluation/Treatment: Yes Reason for Co-Treatment: To address functional/ADL transfers   OT goals addressed during session: ADL's and self-care;Strengthening/ROM                       End of Session Equipment Utilized During Treatment: Gait belt  Activity Tolerance: Patient tolerated treatment well Patient left: in chair;with call bell/phone within reach  OT Visit Diagnosis: Other symptoms and signs involving cognitive function                Time: [id number] OT Time Calculation (min): 15 min Charges:  OT General Charges $OT Visit: 1 Visit OT Evaluation $OT Eval Low Complexity: 1 Low  Bain Whichard OT, MOT   Larey Seat 10/13/2020, 10:15 AM

## 2020-10-13 NOTE — Progress Notes (Addendum)
PROGRESS NOTE  Allison Watson [id number] DOB: [birth year] DOA: 10/10/2020 PCP: Lucia Gaskins, MD  HPI/Recap of past 75 hours: 80 year old with past medical history of hypertension, insomnia and obesity who had been recently admitted for acute metabolic encephalopathy causing hallucinations and was discharged on 6/18 after symptoms felt to be possibly related to sleep medication.  However following discharge, patient's symptoms recurred rapidly and she was brought back to the emergency room on 6/19.  Neurology consulted.  B12 severely deficient and patient started on supplementation.  Patient's symptoms improved.  Underwent MRI which noted abnormality seen involving the pia matter in the right parietal/occipital region concerning for malignancy.  Spinal tap unremarkable.  Patient placed on Depakote.  Concerns that this could be related to chronic underlying seizures and 24-hour EEG recommended.  Today, patient appears to be much more herself, oriented x2 and interactive answering questions appropriately.  She denies any seizure activity.  Assessment/Plan: Principal Problem:   Acute metabolic encephalopathy with hallucinations:?  Seizure.?  Related to malignancy.  Seen by neurology, recommend 24-hour EEG and neurosurgical evaluation for potential biopsy.  The patient's mentation has improved but this is not her baseline and further neurological work up needed as to why this occurred a second time.  Both potential biopsy and 24-hour EEG are not able to be done here in [place] and patient being transferred to [facility].  That is pending. Active Problems:   INSOMNIA, CHRONIC   Essential hypertension: Borderline hypotension.  Monitoring closely.    Obesity (BMI 30-39.9): Patient meets criteria for BMI greater than 30.    Hypomagnesemia: Replacing as needed.    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  Appears to be at baseline.   Hemoglobin stable, also at baseline.   Code Status: Full code  Family Communication: Left message for son  Disposition Plan: Transferring to [facility] for 24-hour EEG and neurosurgical evaluation   Consultants: Neurology Neurosurgery once patient transfers to [facility]  Procedures: 24-hour EEG ordered  Antimicrobials: None  DVT prophylaxis: SCDs  Level of care: Med-Surg   Objective: Vitals:   10/13/20 0919 10/13/20 1313  BP: (!) 107/57 (!) 99/50  Pulse:  (!) 56  Resp:  18  Temp:  98 F (36.7 C)  SpO2:  100%    Intake/Output Summary (Last 24 hours) at 10/13/2020 1548 Last data filed at 10/13/2020 1300 Gross per 24 hour  Intake 600 ml  Output --  Net 600 ml   Filed Weights   10/10/20 2254  Weight: 80 kg   Body mass index is 35.62 kg/m.  Exam:  General: Alert and oriented x2, no acute distress HEENT: Normocephalic and atraumatic, mucous membranes are slightly dry Cardiovascular: Regular rate and rhythm, S1-S2  Respiratory: Clear to auscultation bilaterally Abdomen: Soft, nontender, nondistended, positive bowel sounds Musculoskeletal: No clubbing, cyanosis or edema Skin: No skin breaks, tears or lesions Psychiatry: Appropriate, no evidence of psychoses Neurology: No focal deficits   Data Reviewed: CBC: Recent Labs  Lab 10/08/20 0635 10/11/20 0051 10/11/20 0518  WBC 4.9 9.7 7.6  NEUTROABS  --  8.4*  --   HGB 11.4* 12.2 11.7*  HCT 36.5 38.2 37.1  MCV 87.3 85.8 85.7  PLT 191 223 [id number]   Basic Metabolic Panel: Recent Labs  Lab 10/09/20 0702 10/11/20 0051 10/11/20 0518 10/12/20 0439 10/13/20 0554  NA 143 138 139 141 139  K 4.0 3.8 3.9 3.4* 3.8  CL 115* 109 109 112* 112*  CO2 25 21* 21* 23 22  GLUCOSE 95 111* 102* 91 100*  BUN '12 14 13 15 15  '$ CREATININE 1.56* 1.67* 1.65* 1.79* 1.82*  CALCIUM 9.5 9.7 9.9 9.3 8.9  MG  --   --  1.6* 1.9  --   PHOS  --   --  2.6  --   --    GFR: Estimated Creatinine Clearance: 23.7 mL/min (A) (by C-G  formula based on SCr of 1.82 mg/dL (H)). Liver Function Tests: Recent Labs  Lab 10/08/20 0635 10/11/20 0051 10/11/20 0518 10/12/20 0439  AST '17 19 19 17  '$ ALT '15 18 17 14  '$ ALKPHOS 69 80 74 63  BILITOT 0.3 0.4 0.4 0.6  PROT 5.7* 6.6 6.0* 5.3*  ALBUMIN 2.9* 3.5 3.3* 2.9*   No results for input(s): LIPASE, AMYLASE in the last 168 hours. No results for input(s): AMMONIA in the last 168 hours. Coagulation Profile: Recent Labs  Lab 10/11/20 0518  INR 1.1   Cardiac Enzymes: No results for input(s): CKTOTAL, CKMB, CKMBINDEX, TROPONINI in the last 168 hours. BNP (last 3 results) No results for input(s): PROBNP in the last 8760 hours. HbA1C: No results for input(s): HGBA1C in the last 72 hours. CBG: No results for input(s): GLUCAP in the last 168 hours. Lipid Profile: No results for input(s): CHOL, HDL, LDLCALC, TRIG, CHOLHDL, LDLDIRECT in the last 72 hours. Thyroid Function Tests: No results for input(s): TSH, T4TOTAL, FREET4, T3FREE, THYROIDAB in the last 72 hours. Anemia Panel: Recent Labs    10/12/20 0439  VITAMINB12 107*  FOLATE 7.3   Urine analysis:    Component Value Date/Time   COLORURINE YELLOW 10/11/2020 0153   APPEARANCEUR HAZY (A) 10/11/2020 0153   LABSPEC 1.015 10/11/2020 0153   PHURINE 5.0 10/11/2020 0153   GLUCOSEU NEGATIVE 10/11/2020 0153   HGBUR NEGATIVE 10/11/2020 0153   BILIRUBINUR NEGATIVE 10/11/2020 0153   KETONESUR 20 (A) 10/11/2020 0153   PROTEINUR NEGATIVE 10/11/2020 0153   NITRITE NEGATIVE 10/11/2020 0153   LEUKOCYTESUR NEGATIVE 10/11/2020 0153   Sepsis Labs: '@LABRCNTIP'$ (procalcitonin:4,lacticidven:4)  ) Recent Results (from the past 240 hour(s))  Resp Panel by RT-PCR (Flu A&B, Covid) Nasopharyngeal Swab     Status: None   Collection Time: 10/05/20  9:33 PM   Specimen: Nasopharyngeal Swab; Nasopharyngeal(NP) swabs in vial transport medium  Result Value Ref Range Status   SARS Coronavirus 2 by RT PCR NEGATIVE NEGATIVE Final    Comment:  (NOTE) SARS-CoV-2 target nucleic acids are NOT DETECTED.  The SARS-CoV-2 RNA is generally detectable in upper respiratory specimens during the acute phase of infection. The lowest concentration of SARS-CoV-2 viral copies this assay can detect is 138 copies/mL. A negative result does not preclude SARS-Cov-2 infection and should not be used as the sole basis for treatment or other patient management decisions. A negative result may occur with  improper specimen collection/handling, submission of specimen other than nasopharyngeal swab, presence of viral mutation(s) within the areas targeted by this assay, and inadequate number of viral copies(<138 copies/mL). A negative result must be combined with clinical observations, patient history, and epidemiological information. The expected result is Negative.  Fact Sheet for Patients:  EntrepreneurPulse.com.au  Fact Sheet for Healthcare Providers:  IncredibleEmployment.be  This test is no t yet approved or cleared by the [place] FDA and  has been authorized for detection and/or diagnosis of SARS-CoV-2 by FDA under an Emergency Use Authorization (EUA). This EUA will remain  in effect (meaning this test can be used) for the duration  of the COVID-19 declaration under Section 564(b)(1) of the Act, 21 U.S.C.section 360bbb-3(b)(1), unless the authorization is terminated  or revoked sooner.       Influenza A by PCR NEGATIVE NEGATIVE Final   Influenza B by PCR NEGATIVE NEGATIVE Final    Comment: (NOTE) The Xpert Xpress SARS-CoV-2/FLU/RSV plus assay is intended as an aid in the diagnosis of influenza from Nasopharyngeal swab specimens and should not be used as a sole basis for treatment. Nasal washings and aspirates are unacceptable for Xpert Xpress SARS-CoV-2/FLU/RSV testing.  Fact Sheet for Patients: EntrepreneurPulse.com.au  Fact Sheet for Healthcare  Providers: IncredibleEmployment.be  This test is not yet approved or cleared by the [place] FDA and has been authorized for detection and/or diagnosis of SARS-CoV-2 by FDA under an Emergency Use Authorization (EUA). This EUA will remain in effect (meaning this test can be used) for the duration of the COVID-19 declaration under Section 564(b)(1) of the Act, 21 U.S.C. section 360bbb-3(b)(1), unless the authorization is terminated or revoked.  Performed at [facility], [address], [place], [place] [address]   Culture, blood (routine x 2)     Status: None   Collection Time: 10/06/20 12:18 AM   Specimen: BLOOD RIGHT HAND  Result Value Ref Range Status   Specimen Description BLOOD RIGHT HAND  Final   Special Requests   Final    BOTTLES DRAWN AEROBIC AND ANAEROBIC Blood Culture adequate volume   Culture   Final    NO GROWTH 5 DAYS Performed at [facility], [address], [place], [place] [address]    Report Status 10/11/2020 FINAL  Final  Culture, blood (routine x 2)     Status: None   Collection Time: 10/06/20 12:25 AM   Specimen: BLOOD LEFT HAND  Result Value Ref Range Status   Specimen Description BLOOD LEFT HAND  Final   Special Requests AEROBIC BOTTLE ONLY Blood Culture adequate volume  Final   Culture   Final    NO GROWTH 5 DAYS Performed at [facility], [address], [place], [place] [address]    Report Status 10/11/2020 FINAL  Final  Culture, fungus without smear     Status: None (Preliminary result)   Collection Time: 10/06/20  9:56 AM   Specimen: CSF; Other  Result Value Ref Range Status   Specimen Description CSF  Final   Special Requests NONE  Final   Culture   Final    NO FUNGUS ISOLATED AFTER 7 DAYS Performed at [facility], [address], [place], [place] [address]    Report Status PENDING  Incomplete  Anaerobic culture w Gram Stain     Status: None   Collection Time: 10/06/20  9:57 AM   Specimen: CSF  Result Value Ref  Range Status   Specimen Description   Final    CSF Performed at [facility], [address], [place], [place] [address]    Special Requests   Final    NONE Performed at [facility], [address] [place] [address] [place], [place] [address]    Culture   Final    NO ANAEROBES ISOLATED Performed at [facility], [place] [address], [place], [place] [address]    Report Status 10/12/2020 FINAL  Final  CSF culture w Gram Stain     Status: None   Collection Time: 10/06/20 11:15 AM   Specimen: CSF; Cerebrospinal Fluid  Result Value Ref Range Status   Specimen Description   Final    CSF Performed  at [facility], [address], [place], [place] [address]    Special Requests   Final    NONE Performed at [facility], [address], [place], [place] [address]    Gram Stain   Final    NO ORGANISMS SEEN NO WBC SEEN Performed at [facility], [address] [place], [place] [address]    Culture   Final    NO GROWTH 3 DAYS Performed at [facility], [place] [address] [place], [place] [address]    Report Status 10/10/2020 FINAL  Final  SARS CORONAVIRUS 2 (TAT 6-24 HRS) Nasopharyngeal Nasopharyngeal Swab     Status: None   Collection Time: 10/11/20  3:00 AM   Specimen: Nasopharyngeal Swab  Result Value Ref Range Status   SARS Coronavirus 2 NEGATIVE NEGATIVE Final    Comment: (NOTE) SARS-CoV-2 target nucleic acids are NOT DETECTED.  The SARS-CoV-2 RNA is generally detectable in upper and lower respiratory specimens during the acute phase of infection. Negative results do not preclude SARS-CoV-2 infection, do not rule out co-infections with other pathogens, and should not be used as the sole basis for treatment or other patient management decisions. Negative results must be combined with clinical observations, patient history, and epidemiological information. The expected result is Negative.  Fact Sheet for Patients: SugarRoll.be  Fact Sheet for Healthcare  Providers: https://www.woods-mathews.com/  This test is not yet approved or cleared by the [place] FDA and  has been authorized for detection and/or diagnosis of SARS-CoV-2 by FDA under an Emergency Use Authorization (EUA). This EUA will remain  in effect (meaning this test can be used) for the duration of the COVID-19 declaration under Se ction 564(b)(1) of the Act, 21 U.S.C. section 360bbb-3(b)(1), unless the authorization is terminated or revoked sooner.  Performed at [facility], [place] [address] [place], [place] [address]       Studies: No results found.  Scheduled Meds:  cholecalciferol  1,000 Units Oral Daily   cloNIDine  0.1 mg Oral TID   cyanocobalamin  1,000 mcg Subcutaneous Daily   diltiazem  240 mg Oral Daily   enoxaparin (LOVENOX) injection  30 mg Subcutaneous Q24H   labetalol  100 mg Oral BID   QUEtiapine  50 mg Oral QHS    Continuous Infusions:   LOS: 1 day     Annita Brod, MD [facility]   10/13/2020, 3:48 PM

## 2020-10-13 NOTE — Evaluation (Signed)
Physical Therapy Evaluation Patient Details Name: Allison Watson MRN: [id number] DOB: 10-05-1940 Today's Date: 10/13/2020   History of Present Illness  Allison Watson is a 80 y.o. female with medical history significant for  insomnia, chronic pain, hypertension, gout, arthritis who presents to the emergency department accompanied by daughter due to altered mental status which started yesterday.  Patient was recently admitted from 6/14-6/18 due to acute metabolic encephalopathy and hallucination.  Patient reports seeing people, lizards, snakes and other animals coming out of the walls.  Patient states that she was prescribed sleep medication due to insomnia about 2 months ago, however, the medication appeared to make her to be hallucinating, so she stopped it and this was changed with another sleep medication which she also stopped taking due to same hallucinating effect, after the medication was being changed the 3rd time due to same hallucinations, patient started to take melatonin which appear to have less side effect.  Daughter at bedside states that patient showed some improvement when she first returned home from hospital, she states that she left patient at home for a few hours and when she returned, the house was in disarray and patient's mentation has markedly altered.  She also states that patient's  gait changed as she noted that she was taking very small steps (festinating gait), she was worried, so she activated EMS and patient was taken to the ED for further evaluation and management.   Clinical Impression  Patient functioning at baseline for functional mobility and gait.  Plan:  Patient discharged from physical therapy to care of nursing for ambulation daily as tolerated for length of stay.      Follow Up Recommendations No PT follow up    Equipment Recommendations  None recommended by PT    Recommendations for Other Services       Precautions / Restrictions  Precautions Precautions: None Restrictions Weight Bearing Restrictions: No      Mobility  Bed Mobility Overal bed mobility: Modified Independent                  Transfers Overall transfer level: Modified independent                  Ambulation/Gait Ambulation/Gait assistance: Modified independent (Device/Increase time) Gait Distance (Feet): 200 Feet Assistive device: None Gait Pattern/deviations: WFL(Within Functional Limits) Gait velocity: normal   General Gait Details: demonstrates good return for ambulation on level, inclined and declined surfaces without loss of balance  Stairs Stairs: Yes Stairs assistance: Modified independent (Device/Increase time) Stair Management: One rail Right;One rail Left;Alternating pattern Number of Stairs: 10 General stair comments: demonstrates good return for going up/down stairs using 1 siderail without loss of balance  Wheelchair Mobility    Modified Rankin (Stroke Patients Only)       Balance Overall balance assessment: No apparent balance deficits (not formally assessed)                                           Pertinent Vitals/Pain Pain Assessment: No/denies pain    Home Living Family/patient expects to be discharged to:: Private residence Living Arrangements: Alone Available Help at Discharge: Family;Available PRN/intermittently Type of Home: Apartment Home Access: Level entry     Home Layout: One level Home Equipment: None      Prior Function Level of Independence: Independent  Comments: Hydrographic surveyor, drives, works as a Sales executive: Right    Extremity/Trunk Assessment   Upper Extremity Assessment Upper Extremity Assessment: Defer to OT evaluation    Lower Extremity Assessment Lower Extremity Assessment: Overall WFL for tasks assessed    Cervical / Trunk Assessment Cervical / Trunk Assessment: Normal   Communication   Communication: No difficulties  Cognition Arousal/Alertness: Awake/alert Behavior During Therapy: WFL for tasks assessed/performed Overall Cognitive Status: Within Functional Limits for tasks assessed                                        General Comments      Exercises     Assessment/Plan    PT Assessment Patent does not need any further PT services  PT Problem List         PT Treatment Interventions      PT Goals (Current goals can be found in the Care Plan section)  Acute Rehab PT Goals Patient Stated Goal: return home with family to assist PT Goal Formulation: With patient Time For Goal Achievement: 10/13/20 Potential to Achieve Goals: Good    Frequency     Barriers to discharge        Co-evaluation               AM-PAC PT "6 Clicks" Mobility  Outcome Measure Help needed turning from your back to your side while in a flat bed without using bedrails?: None Help needed moving from lying on your back to sitting on the side of a flat bed without using bedrails?: None Help needed moving to and from a bed to a chair (including a wheelchair)?: None Help needed standing up from a chair using your arms (e.g., wheelchair or bedside chair)?: None Help needed to walk in hospital room?: None Help needed climbing 3-5 steps with a railing? : None 6 Click Score: 24    End of Session   Activity Tolerance: Patient tolerated treatment well Patient left: in chair Nurse Communication: Mobility status PT Visit Diagnosis: Unsteadiness on feet (R26.81);Other abnormalities of gait and mobility (R26.89);Muscle weakness (generalized) (M62.81)    Time: [id number] PT Time Calculation (min) (ACUTE ONLY): 21 min   Charges:   PT Evaluation $PT Eval Moderate Complexity: 1 Mod PT Treatments $Therapeutic Activity: 8-22 mins        9:39 AM, 10/13/20 Lonell Grandchild, MPT Physical Therapist with [facility] 336 [phone]  office [phone] mobile phone

## 2020-10-14 ENCOUNTER — Inpatient Hospital Stay (HOSPITAL_COMMUNITY): Payer: Medicare HMO

## 2020-10-14 LAB — SURGICAL PCR SCREEN
MRSA, PCR: NEGATIVE
Staphylococcus aureus: NEGATIVE

## 2020-10-14 MED ORDER — SODIUM CHLORIDE 0.9 % IV SOLN: INTRAVENOUS | Status: DC

## 2020-10-14 NOTE — Progress Notes (Deleted)
PROGRESS NOTE  Allison Watson [id number] DOB: 04/09/1941 DOA: 10/10/2020 PCP: Lucia Gaskins, MD  HPI/Recap of past 56 hours: 80 year old with past medical history of hypertension, insomnia and obesity who had been recently admitted for acute metabolic encephalopathy causing hallucinations and was discharged on 6/18 after symptoms felt to be possibly related to sleep medication.  However following discharge, patient's symptoms recurred rapidly and she was brought back to the emergency room on 6/19.  Neurology consulted.  B12 severely deficient and patient started on supplementation.  Patient's symptoms improved.  Underwent MRI which noted abnormality seen involving the pia matter in the right parietal/occipital region concerning for malignancy.  Spinal tap unremarkable.  Patient placed on Depakote.  Concerns that this could be related to chronic underlying seizures and 24-hour EEG recommended.  Today, patient appears to be more herself, oriented x2 and interactive answering questions appropriately.  She denies any seizure activity.  Assessment/Plan: Principal Problem:   Acute metabolic encephalopathy with hallucinations:?  Seizure.?  Related to malignancy.  Seen by neurology, recommend 24-hour EEG and neurosurgical evaluation for potential biopsy.  Both of these things are able to be done here in [place] and patient being transferred to [facility].  That is pending.  The patient's mentation has improved but this is not her baseline and we need further work up neurological work up as to why this occurred a second time.  Active Problems:   INSOMNIA, CHRONIC   Essential hypertension: Borderline hypotension.  Monitoring closely.    Obesity (BMI 30-39.9): Patient meets criteria for BMI greater than 30.    Hypomagnesemia: Replacing as needed.    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  Appears to be at baseline.  Hemoglobin stable, also  at baseline.   Code Status: Full code  Family Communication: Left message for son  Disposition Plan: Transferring to [facility] for 24-hour EEG and neurosurgical evaluation   Consultants: Neurology Neurosurgery once patient transfers to [facility]  Procedures: 24-hour EEG ordered  Antimicrobials: None  DVT prophylaxis: SCDs  Level of care: Med-Surg   Objective: Vitals:   10/14/20 0500 10/14/20 0926  BP: 133/72 140/81  Pulse: (!) 55 60  Resp: 18   Temp: 97.6 F (36.4 C)   SpO2: 97%     Intake/Output Summary (Last 24 hours) at 10/14/2020 0945 Last data filed at 10/14/2020 0900 Gross per 24 hour  Intake 840 ml  Output --  Net 840 ml    Filed Weights   10/10/20 2254  Weight: 80 kg   Body mass index is 35.62 kg/m.  Exam:  General: Alert and oriented x2, no acute distress HEENT: Normocephalic and atraumatic, mucous membranes are slightly dry Cardiovascular: Regular rate and rhythm, S1-S2  Respiratory: Clear to auscultation bilaterally Abdomen: Soft, nontender, nondistended, positive bowel sounds Musculoskeletal: No clubbing, cyanosis or edema Skin: No skin breaks, tears or lesions Psychiatry: Appropriate, no evidence of psychoses Neurology: No focal deficits   Data Reviewed: CBC: Recent Labs  Lab 10/08/20 0635 10/11/20 0051 10/11/20 0518  WBC 4.9 9.7 7.6  NEUTROABS  --  8.4*  --   HGB 11.4* 12.2 11.7*  HCT 36.5 38.2 37.1  MCV 87.3 85.8 85.7  PLT 191 223 [id number]    Basic Metabolic Panel: Recent Labs  Lab 10/09/20 0702 10/11/20 0051 10/11/20 0518 10/12/20 0439 10/13/20 0554  NA 143 138 139 141 139  K 4.0 3.8 3.9 3.4* 3.8  CL 115* 109 109 112* 112*  CO2 25 21* 21* 23 22  GLUCOSE 95 111* 102* 91 100*  BUN '12 14 13 15 15  '$ CREATININE 1.56* 1.67* 1.65* 1.79* 1.82*  CALCIUM 9.5 9.7 9.9 9.3 8.9  MG  --   --  1.6* 1.9  --   PHOS  --   --  2.6  --   --     GFR: Estimated Creatinine Clearance: 23.7 mL/min (A) (by C-G formula based on SCr of  1.82 mg/dL (H)). Liver Function Tests: Recent Labs  Lab 10/08/20 0635 10/11/20 0051 10/11/20 0518 10/12/20 0439  AST '17 19 19 17  '$ ALT '15 18 17 14  '$ ALKPHOS 69 80 74 63  BILITOT 0.3 0.4 0.4 0.6  PROT 5.7* 6.6 6.0* 5.3*  ALBUMIN 2.9* 3.5 3.3* 2.9*    No results for input(s): LIPASE, AMYLASE in the last 168 hours. No results for input(s): AMMONIA in the last 168 hours. Coagulation Profile: Recent Labs  Lab 10/11/20 0518  INR 1.1    Cardiac Enzymes: No results for input(s): CKTOTAL, CKMB, CKMBINDEX, TROPONINI in the last 168 hours. BNP (last 3 results) No results for input(s): PROBNP in the last 8760 hours. HbA1C: No results for input(s): HGBA1C in the last 72 hours. CBG: No results for input(s): GLUCAP in the last 168 hours. Lipid Profile: No results for input(s): CHOL, HDL, LDLCALC, TRIG, CHOLHDL, LDLDIRECT in the last 72 hours. Thyroid Function Tests: No results for input(s): TSH, T4TOTAL, FREET4, T3FREE, THYROIDAB in the last 72 hours. Anemia Panel: Recent Labs    10/12/20 0439  VITAMINB12 107*  FOLATE 7.3    Urine analysis:    Component Value Date/Time   COLORURINE YELLOW 10/11/2020 0153   APPEARANCEUR HAZY (A) 10/11/2020 0153   LABSPEC 1.015 10/11/2020 0153   PHURINE 5.0 10/11/2020 0153   GLUCOSEU NEGATIVE 10/11/2020 0153   HGBUR NEGATIVE 10/11/2020 0153   BILIRUBINUR NEGATIVE 10/11/2020 0153   KETONESUR 20 (A) 10/11/2020 0153   PROTEINUR NEGATIVE 10/11/2020 0153   NITRITE NEGATIVE 10/11/2020 0153   LEUKOCYTESUR NEGATIVE 10/11/2020 0153   Sepsis Labs: '@LABRCNTIP'$ (procalcitonin:4,lacticidven:4)  ) Recent Results (from the past 240 hour(s))  Resp Panel by RT-PCR (Flu A&B, Covid) Nasopharyngeal Swab     Status: None   Collection Time: 10/05/20  9:33 PM   Specimen: Nasopharyngeal Swab; Nasopharyngeal(NP) swabs in vial transport medium  Result Value Ref Range Status   SARS Coronavirus 2 by RT PCR NEGATIVE NEGATIVE Final    Comment: (NOTE) SARS-CoV-2  target nucleic acids are NOT DETECTED.  The SARS-CoV-2 RNA is generally detectable in upper respiratory specimens during the acute phase of infection. The lowest concentration of SARS-CoV-2 viral copies this assay can detect is 138 copies/mL. A negative result does not preclude SARS-Cov-2 infection and should not be used as the sole basis for treatment or other patient management decisions. A negative result may occur with  improper specimen collection/handling, submission of specimen other than nasopharyngeal swab, presence of viral mutation(s) within the areas targeted by this assay, and inadequate number of viral copies(<138 copies/mL). A negative result must be combined with clinical observations, patient history, and epidemiological information. The expected result is Negative.  Fact Sheet for Patients:  EntrepreneurPulse.com.au  Fact Sheet for Healthcare Providers:  IncredibleEmployment.be  This test is no t yet approved or cleared by the [place] FDA and  has been authorized for detection and/or diagnosis of SARS-CoV-2 by FDA under an Emergency Use Authorization (EUA). This EUA will remain  in effect (meaning this test can be  used) for the duration of the COVID-19 declaration under Section 564(b)(1) of the Act, 21 U.S.C.section 360bbb-3(b)(1), unless the authorization is terminated  or revoked sooner.       Influenza A by PCR NEGATIVE NEGATIVE Final   Influenza B by PCR NEGATIVE NEGATIVE Final    Comment: (NOTE) The Xpert Xpress SARS-CoV-2/FLU/RSV plus assay is intended as an aid in the diagnosis of influenza from Nasopharyngeal swab specimens and should not be used as a sole basis for treatment. Nasal washings and aspirates are unacceptable for Xpert Xpress SARS-CoV-2/FLU/RSV testing.  Fact Sheet for Patients: EntrepreneurPulse.com.au  Fact Sheet for Healthcare  Providers: IncredibleEmployment.be  This test is not yet approved or cleared by the [place] FDA and has been authorized for detection and/or diagnosis of SARS-CoV-2 by FDA under an Emergency Use Authorization (EUA). This EUA will remain in effect (meaning this test can be used) for the duration of the COVID-19 declaration under Section 564(b)(1) of the Act, 21 U.S.C. section 360bbb-3(b)(1), unless the authorization is terminated or revoked.  Performed at [facility], [address], [place], [place] [address]   Culture, blood (routine x 2)     Status: None   Collection Time: 10/06/20 12:18 AM   Specimen: BLOOD RIGHT HAND  Result Value Ref Range Status   Specimen Description BLOOD RIGHT HAND  Final   Special Requests   Final    BOTTLES DRAWN AEROBIC AND ANAEROBIC Blood Culture adequate volume   Culture   Final    NO GROWTH 5 DAYS Performed at [facility], [address], [place], [place] [address]    Report Status 10/11/2020 FINAL  Final  Culture, blood (routine x 2)     Status: None   Collection Time: 10/06/20 12:25 AM   Specimen: BLOOD LEFT HAND  Result Value Ref Range Status   Specimen Description BLOOD LEFT HAND  Final   Special Requests AEROBIC BOTTLE ONLY Blood Culture adequate volume  Final   Culture   Final    NO GROWTH 5 DAYS Performed at [facility], [address] [place], [place] [address]    Report Status 10/11/2020 FINAL  Final  Culture, fungus without smear     Status: None (Preliminary result)   Collection Time: 10/06/20  9:56 AM   Specimen: CSF; Other  Result Value Ref Range Status   Specimen Description CSF  Final   Special Requests NONE  Final   Culture   Final    NO FUNGUS ISOLATED AFTER 7 DAYS Performed at [facility], [address] [place], [place] [address]    Report Status PENDING  Incomplete  Anaerobic culture w Gram Stain     Status: None   Collection Time: 10/06/20  9:57 AM   Specimen: CSF  Result Value Ref  Range Status   Specimen Description   Final    CSF Performed at [facility], [address], [place], [place] [address]    Special Requests   Final    NONE Performed at [facility], [address], [place], [place] [address]    Culture   Final    NO ANAEROBES ISOLATED Performed at [facility], [place] [address], [place], [place] [address]    Report Status 10/12/2020 FINAL  Final  CSF culture w Gram Stain     Status: None   Collection Time: 10/06/20 11:15 AM   Specimen: CSF; Cerebrospinal Fluid  Result Value Ref Range Status   Specimen Description   Final  CSF Performed at [facility], [address] [place], [place] [address]    Special Requests   Final    NONE Performed at [facility], [address], [place], [place] [address]    Gram Stain   Final    NO ORGANISMS SEEN NO WBC SEEN Performed at [facility], [address], [place], [place] [address]    Culture   Final    NO GROWTH 3 DAYS Performed at [facility], [place] [address] [place], [place] [address]    Report Status 10/10/2020 FINAL  Final  SARS CORONAVIRUS 2 (TAT 6-24 HRS) Nasopharyngeal Nasopharyngeal Swab     Status: None   Collection Time: 10/11/20  3:00 AM   Specimen: Nasopharyngeal Swab  Result Value Ref Range Status   SARS Coronavirus 2 NEGATIVE NEGATIVE Final    Comment: (NOTE) SARS-CoV-2 target nucleic acids are NOT DETECTED.  The SARS-CoV-2 RNA is generally detectable in upper and lower respiratory specimens during the acute phase of infection. Negative results do not preclude SARS-CoV-2 infection, do not rule out co-infections with other pathogens, and should not be used as the sole basis for treatment or other patient management decisions. Negative results must be combined with clinical observations, patient history, and epidemiological information. The expected result is Negative.  Fact Sheet for Patients: SugarRoll.be  Fact Sheet for Healthcare  Providers: https://www.woods-mathews.com/  This test is not yet approved or cleared by the [place] FDA and  has been authorized for detection and/or diagnosis of SARS-CoV-2 by FDA under an Emergency Use Authorization (EUA). This EUA will remain  in effect (meaning this test can be used) for the duration of the COVID-19 declaration under Se ction 564(b)(1) of the Act, 21 U.S.C. section 360bbb-3(b)(1), unless the authorization is terminated or revoked sooner.  Performed at [facility], [place] [address] [place], [place] [address]       Studies: No results found.  Scheduled Meds:  cholecalciferol  1,000 Units Oral Daily   cloNIDine  0.1 mg Oral TID   cyanocobalamin  1,000 mcg Subcutaneous Daily   diltiazem  240 mg Oral Daily   enoxaparin (LOVENOX) injection  30 mg Subcutaneous Q24H   labetalol  100 mg Oral BID   QUEtiapine  50 mg Oral QHS    Continuous Infusions:   LOS: 2 days     Annita Brod, MD [facility]   10/14/2020, 9:45 AM

## 2020-10-14 NOTE — Progress Notes (Signed)
LTM EEG hooked up and running - no initial skin breakdown - push button tested - neuro notified. [facility] monitoring.  

## 2020-10-14 NOTE — Progress Notes (Signed)
First attempt to call report. Gave name and number to nurse for them to call back for report.

## 2020-10-14 NOTE — Progress Notes (Signed)
Second attempt to call report unsuccessful.

## 2020-10-14 NOTE — Progress Notes (Signed)
PROGRESS NOTE  Allison Watson [id number] DOB: 1940-09-02 DOA: 10/10/2020 PCP: Lucia Gaskins, MD  HPI/Recap of past 39 hours: 80 year old with past medical history of hypertension, insomnia and obesity who had been recently admitted for acute metabolic encephalopathy causing hallucinations and was discharged on 6/18 after symptoms felt to be possibly related to sleep medication.  However following discharge, patient's symptoms recurred rapidly and she was brought back to the emergency room on 6/19.  Neurology consulted.  B12 severely deficient and patient started on supplementation.  Patient's symptoms improved.  Underwent MRI which noted abnormality seen involving the pia matter in the right parietal/occipital region concerning for malignancy.  Spinal tap unremarkable.  Patient placed on Depakote.  Concerns that this could be related to chronic underlying seizures and 24-hour EEG recommended.  Over time, patient's mentation has improved although not yet back at baseline.  Currently she is waiting for transfer to [facility] for 24-hour EEG and potential biopsy.  She feels ok, tired.  Tolerating some po.  Assessment/Plan: Principal Problem:   Acute metabolic encephalopathy with hallucinations:?  Seizure.?  Related to malignancy.  Seen by neurology, recommend 24-hour EEG and neurosurgical evaluation for potential biopsy.  The patient's mentation has improved but this is not her baseline and further neurological work up needed as to why this occurred a second time.  Both potential biopsy and 24-hour EEG are not able to be done here in [place] and patient being transferred to [facility].  That is pending. Active Problems:   INSOMNIA, CHRONIC   Essential hypertension: Borderline hypotension.  Monitoring closely.    Obesity (BMI 30-39.9): Patient meets criteria for BMI greater than 30.    Hypomagnesemia: Replacing as needed.    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  Appears to be at baseline.  Hemoglobin stable, also at baseline.  We will start some gentle IV fluids to improve the hypotension   Code Status: Full code  Family Communication: Left message for son  Disposition Plan: Transferring to [facility] for 24-hour EEG and neurosurgical evaluation   Consultants: Neurology Neurosurgery once patient transfers to [facility]  Procedures: 24-hour EEG ordered  Antimicrobials: None  DVT prophylaxis: SCDs  Level of care: Med-Surg   Objective: Vitals:   10/14/20 0500 10/14/20 0926  BP: 133/72 140/81  Pulse: (!) 55 60  Resp: 18   Temp: 97.6 F (36.4 C)   SpO2: 97%     Intake/Output Summary (Last 24 hours) at 10/14/2020 0947 Last data filed at 10/14/2020 0900 Gross per 24 hour  Intake 840 ml  Output --  Net 840 ml    Filed Weights   10/10/20 2254  Weight: 80 kg   Body mass index is 35.62 kg/m.  Exam:  General: Alert and oriented x2, no acute distress HEENT: Normocephalic and atraumatic, mucous membranes are slightly dry Cardiovascular: Regular rate and rhythm, S1-S2  Respiratory: Clear to auscultation bilaterally Abdomen: Soft, nontender, nondistended, positive bowel sounds Musculoskeletal: No clubbing, cyanosis or edema Skin: No skin breaks, tears or lesions Psychiatry: Appropriate, no evidence of psychoses Neurology: No focal deficits   Data Reviewed: CBC: Recent Labs  Lab 10/08/20 0635 10/11/20 0051 10/11/20 0518  WBC 4.9 9.7 7.6  NEUTROABS  --  8.4*  --   HGB 11.4* 12.2 11.7*  HCT 36.5 38.2 37.1  MCV 87.3 85.8 85.7  PLT 191 223 [id number]    Basic Metabolic Panel: Recent Labs  Lab 10/09/20 0702 10/11/20 0051  10/11/20 0518 10/12/20 0439 10/13/20 0554  NA 143 138 139 141 139  K 4.0 3.8 3.9 3.4* 3.8  CL 115* 109 109 112* 112*  CO2 25 21* 21* 23 22  GLUCOSE 95 111* 102* 91 100*  BUN '12 14 13 15 15  '$ CREATININE 1.56* 1.67* 1.65* 1.79* 1.82*  CALCIUM 9.5 9.7 9.9 9.3 8.9  MG  --    --  1.6* 1.9  --   PHOS  --   --  2.6  --   --     GFR: Estimated Creatinine Clearance: 23.7 mL/min (A) (by C-G formula based on SCr of 1.82 mg/dL (H)). Liver Function Tests: Recent Labs  Lab 10/08/20 0635 10/11/20 0051 10/11/20 0518 10/12/20 0439  AST '17 19 19 17  '$ ALT '15 18 17 14  '$ ALKPHOS 69 80 74 63  BILITOT 0.3 0.4 0.4 0.6  PROT 5.7* 6.6 6.0* 5.3*  ALBUMIN 2.9* 3.5 3.3* 2.9*    No results for input(s): LIPASE, AMYLASE in the last 168 hours. No results for input(s): AMMONIA in the last 168 hours. Coagulation Profile: Recent Labs  Lab 10/11/20 0518  INR 1.1    Cardiac Enzymes: No results for input(s): CKTOTAL, CKMB, CKMBINDEX, TROPONINI in the last 168 hours. BNP (last 3 results) No results for input(s): PROBNP in the last 8760 hours. HbA1C: No results for input(s): HGBA1C in the last 72 hours. CBG: No results for input(s): GLUCAP in the last 168 hours. Lipid Profile: No results for input(s): CHOL, HDL, LDLCALC, TRIG, CHOLHDL, LDLDIRECT in the last 72 hours. Thyroid Function Tests: No results for input(s): TSH, T4TOTAL, FREET4, T3FREE, THYROIDAB in the last 72 hours. Anemia Panel: Recent Labs    10/12/20 0439  VITAMINB12 107*  FOLATE 7.3    Urine analysis:    Component Value Date/Time   COLORURINE YELLOW 10/11/2020 0153   APPEARANCEUR HAZY (A) 10/11/2020 0153   LABSPEC 1.015 10/11/2020 0153   PHURINE 5.0 10/11/2020 0153   GLUCOSEU NEGATIVE 10/11/2020 0153   HGBUR NEGATIVE 10/11/2020 0153   BILIRUBINUR NEGATIVE 10/11/2020 0153   KETONESUR 20 (A) 10/11/2020 0153   PROTEINUR NEGATIVE 10/11/2020 0153   NITRITE NEGATIVE 10/11/2020 0153   LEUKOCYTESUR NEGATIVE 10/11/2020 0153   Sepsis Labs: '@LABRCNTIP'$ (procalcitonin:4,lacticidven:4)  ) Recent Results (from the past 240 hour(s))  Resp Panel by RT-PCR (Flu A&B, Covid) Nasopharyngeal Swab     Status: None   Collection Time: 10/05/20  9:33 PM   Specimen: Nasopharyngeal Swab; Nasopharyngeal(NP) swabs in  vial transport medium  Result Value Ref Range Status   SARS Coronavirus 2 by RT PCR NEGATIVE NEGATIVE Final    Comment: (NOTE) SARS-CoV-2 target nucleic acids are NOT DETECTED.  The SARS-CoV-2 RNA is generally detectable in upper respiratory specimens during the acute phase of infection. The lowest concentration of SARS-CoV-2 viral copies this assay can detect is 138 copies/mL. A negative result does not preclude SARS-Cov-2 infection and should not be used as the sole basis for treatment or other patient management decisions. A negative result may occur with  improper specimen collection/handling, submission of specimen other than nasopharyngeal swab, presence of viral mutation(s) within the areas targeted by this assay, and inadequate number of viral copies(<138 copies/mL). A negative result must be combined with clinical observations, patient history, and epidemiological information. The expected result is Negative.  Fact Sheet for Patients:  EntrepreneurPulse.com.au  Fact Sheet for Healthcare Providers:  IncredibleEmployment.be  This test is no t yet approved or cleared by the [place] FDA and  has  been authorized for detection and/or diagnosis of SARS-CoV-2 by FDA under an Emergency Use Authorization (EUA). This EUA will remain  in effect (meaning this test can be used) for the duration of the COVID-19 declaration under Section 564(b)(1) of the Act, 21 U.S.C.section 360bbb-3(b)(1), unless the authorization is terminated  or revoked sooner.       Influenza A by PCR NEGATIVE NEGATIVE Final   Influenza B by PCR NEGATIVE NEGATIVE Final    Comment: (NOTE) The Xpert Xpress SARS-CoV-2/FLU/RSV plus assay is intended as an aid in the diagnosis of influenza from Nasopharyngeal swab specimens and should not be used as a sole basis for treatment. Nasal washings and aspirates are unacceptable for Xpert Xpress SARS-CoV-2/FLU/RSV testing.  Fact  Sheet for Patients: EntrepreneurPulse.com.au  Fact Sheet for Healthcare Providers: IncredibleEmployment.be  This test is not yet approved or cleared by the [place] FDA and has been authorized for detection and/or diagnosis of SARS-CoV-2 by FDA under an Emergency Use Authorization (EUA). This EUA will remain in effect (meaning this test can be used) for the duration of the COVID-19 declaration under Section 564(b)(1) of the Act, 21 U.S.C. section 360bbb-3(b)(1), unless the authorization is terminated or revoked.  Performed at [facility], [address] [place], [place] [address]   Culture, blood (routine x 2)     Status: None   Collection Time: 10/06/20 12:18 AM   Specimen: BLOOD RIGHT HAND  Result Value Ref Range Status   Specimen Description BLOOD RIGHT HAND  Final   Special Requests   Final    BOTTLES DRAWN AEROBIC AND ANAEROBIC Blood Culture adequate volume   Culture   Final    NO GROWTH 5 DAYS Performed at [facility], [address] [place], [place] [address]    Report Status 10/11/2020 FINAL  Final  Culture, blood (routine x 2)     Status: None   Collection Time: 10/06/20 12:25 AM   Specimen: BLOOD LEFT HAND  Result Value Ref Range Status   Specimen Description BLOOD LEFT HAND  Final   Special Requests AEROBIC BOTTLE ONLY Blood Culture adequate volume  Final   Culture   Final    NO GROWTH 5 DAYS Performed at [facility], [address] [place], [place] [address]    Report Status 10/11/2020 FINAL  Final  Culture, fungus without smear     Status: None (Preliminary result)   Collection Time: 10/06/20  9:56 AM   Specimen: CSF; Other  Result Value Ref Range Status   Specimen Description CSF  Final   Special Requests NONE  Final   Culture   Final    NO FUNGUS ISOLATED AFTER 7 DAYS Performed at [facility], [address] [place], [place] [address]    Report Status PENDING  Incomplete  Anaerobic culture w Gram Stain      Status: None   Collection Time: 10/06/20  9:57 AM   Specimen: CSF  Result Value Ref Range Status   Specimen Description   Final    CSF Performed at [facility], [address] [place], [place] [address]    Special Requests   Final    NONE Performed at [facility], [address] [place], [place] [address]    Culture   Final    NO ANAEROBES ISOLATED Performed at [facility], [place] [address] [place], [place] [address]    Report Status 10/12/2020 FINAL  Final  CSF culture w Gram Stain     Status:  None   Collection Time: 10/06/20 11:15 AM   Specimen: CSF; Cerebrospinal Fluid  Result Value Ref Range Status   Specimen Description   Final    CSF Performed at [facility], [address] [place], [place] [address]    Special Requests   Final    NONE Performed at [facility], [address], [place], [place] [address]    Gram Stain   Final    NO ORGANISMS SEEN NO WBC SEEN Performed at [facility], [address] [place], [place] [address]    Culture   Final    NO GROWTH 3 DAYS Performed at [facility], [place] [address], [place], [place] [address]    Report Status 10/10/2020 FINAL  Final  SARS CORONAVIRUS 2 (TAT 6-24 HRS) Nasopharyngeal Nasopharyngeal Swab     Status: None   Collection Time: 10/11/20  3:00 AM   Specimen: Nasopharyngeal Swab  Result Value Ref Range Status   SARS Coronavirus 2 NEGATIVE NEGATIVE Final    Comment: (NOTE) SARS-CoV-2 target nucleic acids are NOT DETECTED.  The SARS-CoV-2 RNA is generally detectable in upper and lower respiratory specimens during the acute phase of infection. Negative results do not preclude SARS-CoV-2 infection, do not rule out co-infections with other pathogens, and should not be used as the sole basis for treatment or other patient management decisions. Negative results must be combined with clinical observations, patient history, and epidemiological information. The expected result is Negative.  Fact  Sheet for Patients: SugarRoll.be  Fact Sheet for Healthcare Providers: https://www.woods-mathews.com/  This test is not yet approved or cleared by the [place] FDA and  has been authorized for detection and/or diagnosis of SARS-CoV-2 by FDA under an Emergency Use Authorization (EUA). This EUA will remain  in effect (meaning this test can be used) for the duration of the COVID-19 declaration under Se ction 564(b)(1) of the Act, 21 U.S.C. section 360bbb-3(b)(1), unless the authorization is terminated or revoked sooner.  Performed at [facility], [place] [address] [place], [place] [address]       Studies: No results found.  Scheduled Meds:  cholecalciferol  1,000 Units Oral Daily   cloNIDine  0.1 mg Oral TID   cyanocobalamin  1,000 mcg Subcutaneous Daily   diltiazem  240 mg Oral Daily   enoxaparin (LOVENOX) injection  30 mg Subcutaneous Q24H   labetalol  100 mg Oral BID   QUEtiapine  50 mg Oral QHS    Continuous Infusions:   LOS: 2 days     Annita Brod, MD [facility]   10/14/2020, 9:47 AM

## 2020-10-15 DIAGNOSIS — G9341 Metabolic encephalopathy: Principal | ICD-10-CM

## 2020-10-15 LAB — CBC
HCT: 33.9 % — ABNORMAL LOW (ref 36.0–46.0)
Hemoglobin: 11 g/dL — ABNORMAL LOW (ref 12.0–15.0)
MCH: 27.6 pg (ref 26.0–34.0)
MCHC: 32.4 g/dL (ref 30.0–36.0)
MCV: 85 fL (ref 80.0–100.0)
Platelets: 173 10*3/uL (ref 150–400)
RBC: 3.99 MIL/uL (ref 3.87–5.11)
RDW: 17.2 % — ABNORMAL HIGH (ref 11.5–15.5)
WBC: 4.5 10*3/uL (ref 4.0–10.5)
nRBC: 0 % (ref 0.0–0.2)

## 2020-10-15 LAB — BASIC METABOLIC PANEL
Anion gap: 9 (ref 5–15)
BUN: 12 mg/dL (ref 8–23)
CO2: 21 mmol/L — ABNORMAL LOW (ref 22–32)
Calcium: 9.4 mg/dL (ref 8.9–10.3)
Chloride: 110 mmol/L (ref 98–111)
Creatinine, Ser: 1.61 mg/dL — ABNORMAL HIGH (ref 0.44–1.00)
GFR, Estimated: 33 mL/min — ABNORMAL LOW (ref >60)
Glucose, Bld: 102 mg/dL — ABNORMAL HIGH (ref 70–99)
Potassium: 3.6 mmol/L (ref 3.5–5.1)
Sodium: 140 mmol/L (ref 135–145)

## 2020-10-15 NOTE — Progress Notes (Signed)
Pt has D/C orders, however pt is still connected to EEG monitor.  Called number for EEG tech and advised there is not an available EEG tech to disconnect pt. Dr. Laurel Dimmer advised, will hold pt until tomorrow for D/C.

## 2020-10-15 NOTE — Progress Notes (Signed)
LTM maintenance completed; no skin breakdown was seen. Tested event button.

## 2020-10-15 NOTE — Progress Notes (Signed)
Delayed entry  LTM maint complete - serviced CZ F3  [facility] monitored, Event button test confirmed by [facility].

## 2020-10-15 NOTE — Discharge Summary (Signed)
Discharge Summary  Allison Watson [id number] DOB: 01/03/1941  PCP: Lucia Gaskins, MD  Admit date: 10/10/2020 Discharge date: 10/15/2020  Time spent: 25 minutes  Recommendations for Outpatient Follow-up:  Patient will resume all of her normal home medications, including ones that have been recently started such as Zyprexa and Topamax, including ones that have been started during her most recent hospitalization including Zyprexa Patient will follow up with her PCP in the next 2 to 3 weeks  Discharge Diagnoses:  Active Hospital Problems   Diagnosis Date Noted   Acute metabolic encephalopathy [id number]   Obesity (BMI 30-39.9) 10/11/2020   Hypomagnesemia 10/11/2020   Anemia due to chronic kidney disease 10/11/2020   INSOMNIA, CHRONIC 05/08/2007   Essential hypertension 03/13/2006    Resolved Hospital Problems  No resolved problems to display.    Discharge Condition: Improved, being discharged home  Diet recommendation: Low-sodium  Vitals:   10/15/20 1136 10/15/20 1548  BP: (!) 158/66 (!) 136/57  Pulse: (!) 57 (!) 55  Resp: 16   Temp: 98.6 F (37 C) 98.1 F (36.7 C)  SpO2: 92% 98%    History of present illness:  80 year old with past medical history of hypertension, insomnia and obesity who had been recently admitted for acute metabolic encephalopathy causing hallucinations and was discharged on 6/18 after symptoms felt to be possibly related to sleep medication.  However following discharge, patient's symptoms recurred rapidly and she was brought back to the emergency room on 6/19.  Neurology consulted.  B12 severely deficient and patient started on supplementation.  Patient's symptoms improved.  Underwent MRI which noted abnormality seen involving the pia matter in the right parietal/occipital region concerning for malignancy.  Spinal tap unremarkable.  Patient placed on Depakote.  Concerns that this could be related to chronic underlying seizures and 24-hour EEG  recommended.  Hospital Course:  Principal Problem:   Acute metabolic encephalopathy Active Problems:   INSOMNIA, CHRONIC   Essential hypertension   Obesity (BMI 30-39.9)   Hypomagnesemia   Anemia due to chronic kidney disease Principal Problem:   Acute metabolic encephalopathy with hallucinations:?  Seizure.?  Related to malignancy.  Seen by neurology, who recommended 24-hour EEG, and thus patient was transferred to [facility].  EEG completed on 6/24 afternoon.  Preliminary results noted no evidence of seizure activity.  Some cortical dysfunction arising from the right temporoparietal region and nonspecific but likely secondary to underlying structural abnormality.  Case discussed with neurosurgery, but without solid structure, biopsy not recommended.  Patient mentioned that she did not think she would want a brain biopsy. Active Problems:   INSOMNIA, CHRONIC   Essential hypertension: Borderline hypotension.  Monitoring closely.     Obesity (BMI 30-39.9): Patient meets criteria for BMI greater than 30.     Hypomagnesemia: Replacing as needed.     Anemia due to chronic kidney disease: Patient with chronic stage IV kidney disease.  Appears to be at baseline.  Hemoglobin stable, also at baseline.  Received IV fluids to help with hypotension.  Procedures: 24-hour EEG: Final report pending.  No evidence of epileptiform discharges.  Some cortical dysfunction at the right temporoparietal region, although this is nonspecific and likely secondary to structural abnormality.  Consultations: Neurology  Discharge Exam: BP (!) 136/57 (BP Location: Right Arm)   Pulse (!) 55   Temp 98.1 F (36.7 C) (Oral)   Resp 16   Ht '4\' 11"'$  (1.499 m)   Wt 80 kg   SpO2 98%   BMI 35.62 kg/m  General: Alert and oriented x3, no acute distress Cardiovascular: Regular rate and rhythm, S1-S2 Respiratory: Clear to auscultation bilaterally  Discharge Instructions You were cared for by a hospitalist during  your hospital stay. If you have any questions about your discharge medications or the care you received while you were in the hospital after you are discharged, you can call the unit and asked to speak with the hospitalist on call if the hospitalist that took care of you is not available. Once you are discharged, your primary care physician will handle any further medical issues. Please note that NO REFILLS for any discharge medications will be authorized once you are discharged, as it is imperative that you return to your primary care physician (or establish a relationship with a primary care physician if you do not have one) for your aftercare needs so that they can reassess your need for medications and monitor your lab values.  Discharge Instructions     Diet - low sodium heart healthy   Complete by: As directed    Increase activity slowly   Complete by: As directed       Allergies as of 10/15/2020       Reactions   Codeine Rash   Lorazepam Other (See Comments)   Hallucinations per daughter [name]   Penicillins Rash   Has patient had a PCN reaction causing immediate rash, facial/tongue/throat swelling, SOB or lightheadedness with hypotension: Yes Has patient had a PCN reaction causing severe rash involving mucus membranes or skin necrosis: No Has patient had a PCN reaction that required hospitalization No Has patient had a PCN reaction occurring within the last 10 years: No If all of the above answers are "NO", then may proceed with Cephalosporin use.        Medication List     STOP taking these medications    oxyCODONE 5 MG immediate release tablet Commonly known as: Oxy IR/ROXICODONE       TAKE these medications    albuterol 108 (90 Base) MCG/ACT inhaler Commonly known as: VENTOLIN HFA Inhale 1-2 puffs into the lungs every 6 (six) hours as needed for wheezing or shortness of breath. Will use when too hot/ too cold outside   cholecalciferol 25 MCG (1000 UNIT)  tablet Commonly known as: VITAMIN D3 Take 1,000 Units by mouth daily.   diltiazem 240 MG 24 hr capsule Commonly known as: CARDIZEM CD Take 240 mg by mouth daily.   hydrALAZINE 100 MG tablet Commonly known as: APRESOLINE Take 1 tablet (100 mg total) by mouth 2 (two) times daily.   labetalol 100 MG tablet Commonly known as: NORMODYNE Take 1 tablet (100 mg total) by mouth 2 (two) times daily.   lisinopril-hydrochlorothiazide 20-12.5 MG tablet Commonly known as: ZESTORETIC Take 1 tablet by mouth daily.   naproxen 500 MG tablet Commonly known as: NAPROSYN Take 500 mg by mouth daily as needed (pain in arm).   OLANZapine 10 MG tablet Commonly known as: ZYPREXA Take 1 tablet (10 mg total) by mouth at bedtime.   topiramate 100 MG tablet Commonly known as: TOPAMAX Take 1 tablet (100 mg total) by mouth 2 (two) times daily.        Allergies  Allergen Reactions   Codeine Rash   Lorazepam Other (See Comments)    Hallucinations per daughter [name]   Penicillins Rash    Has patient had a PCN reaction causing immediate rash, facial/tongue/throat swelling, SOB or lightheadedness with hypotension: Yes Has patient had a PCN reaction causing severe  rash involving mucus membranes or skin necrosis: No Has patient had a PCN reaction that required hospitalization No Has patient had a PCN reaction occurring within the last 10 years: No If all of the above answers are "NO", then may proceed with Cephalosporin use.       The results of significant diagnostics from this hospitalization (including imaging, microbiology, ancillary and laboratory) are listed below for reference.    Significant Diagnostic Studies: DG Chest 1 View  Result Date: 10/11/2020 CLINICAL DATA:  Altered mental status. EXAM: CHEST  1 VIEW COMPARISON:  05/14/2013 FINDINGS: The heart size and mediastinal contours are within normal limits. Both lungs are clear. The visualized skeletal structures are unremarkable.  IMPRESSION: No active disease. Electronically Signed   By: Kerby Moors M.D.   On: 10/11/2020 00:57   MR Brain W and Wo Contrast  Result Date: 10/05/2020 CLINICAL DATA:  Psychosis.  Vision changes. EXAM: MRI HEAD WITHOUT AND WITH CONTRAST TECHNIQUE: Multiplanar, multiecho pulse sequences of the brain and surrounding structures were obtained without and with intravenous contrast. CONTRAST:  22m GADAVIST GADOBUTROL 1 MMOL/ML IV SOLN midline shift. COMPARISON:  CT head March 2021. FINDINGS: Brain: Abnormal sulcal FLAIR hyperintensity and curvilinear pial enhancement involving the posterior right cerebrum, including the parietal lobe, occipital lobe and posterior right temporal lobe. No restricted diffusion to suggest acute infarct. No subjacent encephalomalacia or associated susceptibility artifact. Mild atrophy with ex vacuo ventricular dilation. No hydrocephalus. Moderate scattered T2/FLAIR hyperintensities within the white matter, which are nonspecific but most likely related to chronic microvascular ischemic disease. No sizable extra-axial fluid collection. No mass lesion or midline shift. Vascular: Major arterial flow voids are maintained skull base. Skull and upper cervical spine: Normal marrow signal. Sinuses/Orbits: Left maxillary sinus retention cyst. Unremarkable orbits. Other: No mastoid effusions. IMPRESSION: 1. Abnormal focal sulcal FLAIR hyperintensity and pial enhancement involving the posterior right cerebrum, including the right parietal, occipital and posterior temporal lobes. Findings are nonspecific with differential considerations including meningitis/encephalitis, postictal hyperemia, neurosarcoidosis, vasculitis, and leptomeningeal carcinomatosis. Recommend neurology consultation and correlation with lumbar puncture. 2. Moderate chronic microvascular ischemic disease. Findings discussed with Dr. PAlvino Chapelvia telephone at 4:45 PM. Electronically Signed   By: FMargaretha SheffieldMD   On:  10/05/2020 16:54   EEG adult  Result Date: 10/08/2020 DPhillips Odor MD     10/08/2020  8:13 PM [email or web address]. DMerlene Laughter MD     [email or web address]       HISTORY: 80year old who presents with altered mental status.  The studies been done to evaluate for seizures. MEDICATIONS: Current Facility-Administered Medications:   0.9 %  sodium chloride infusion, , Intravenous, Continuous, Emokpae, Courage, MD, Last Rate: 50 mL/hr at 10/08/20 1808, New Bag at 10/08/20 1808   acetaminophen (TYLENOL) tablet 650 mg, 650 mg, Oral, Q6H PRN **OR** acetaminophen (TYLENOL) suppository 650 mg, 650 mg, Rectal, Q6H PRN, Zierle-Ghosh, Asia B, DO   hydrALAZINE (APRESOLINE) injection 10 mg, 10 mg, Intravenous, Q6H PRN, Emokpae, Courage, MD   labetalol (NORMODYNE) tablet 100 mg, 100 mg, Oral, BID, Zierle-Ghosh, Asia B, DO, 100 mg at 10/08/20 1031   LORazepam (ATIVAN) tablet 0.5 mg, 0.5 mg, Oral, QHS PRN, Zierle-Ghosh, Asia B, DO   OLANZapine (ZYPREXA) tablet 7.5 mg, 7.5 mg, Oral, QHS, [place], Kofi, MD, 7.5 mg at 10/07/20 2204   ondansetron (ZOFRAN) tablet 4 mg, 4 mg, Oral, Q6H PRN **OR** ondansetron (ZOFRAN) injection 4 mg, 4 mg, Intravenous, Q6H PRN, Zierle-Ghosh, Asia B, DO   topiramate (TOPAMAX) tablet 50 mg,  50 mg, Oral, BID, Emokpae, Courage, MD, 50 mg at 10/08/20 1032   verapamil (CALAN) tablet 120 mg, 120 mg, Oral, BID, Zierle-Ghosh, Asia B, DO, 120 mg at 10/08/20 1032 ANALYSIS: A 16 channel recording using standard 10 20 measurements is conducted for 23 minutes.  The background rhythm is that of 7 to 7.5 Hz.  There is beta activity observed in frontal areas.  Awake and sleep architecture is noted.  Sleep spindles and rudimentary K complexes are noted.  Vertex sharp waves are also noted.  Photic stimulation and hyperventilation are not conducted.  There is no focal or lateral slowing.  There is no epileptiform activity is noted. IMPRESSION: 1.  This recording of the awake and sleep state shows mild global  slowing indicating a mild global encephalopathy.  Otherwise, no epileptiform discharges are noted. Kofi A. Merlene Laughter, M.D. Diplomate, Tax adviser of Psychiatry and Neurology ( Neurology).   Overnight EEG with video  Result Date: 10/15/2020 Lora Havens, MD     10/15/2020  9:14 AM Patient Name: QUINNE MINION MRN: [id number] Epilepsy Attending: Lora Havens Referring Physician/Provider: Dr Phillips Odor Duration: 10/14/2020 1617 to 10/15/2020 0900 Patient history: 80 year old female with progressive visual hallucinations.  EEG to evaluate for seizures. Level of alertness: Awake, asleep AEDs during EEG study: None Technical aspects: This EEG study was done with scalp electrodes positioned according to the 10-20 International system of electrode placement. Electrical activity was acquired at a sampling rate of '500Hz'$  and reviewed with a high frequency filter of '70Hz'$  and a low frequency filter of '1Hz'$ . EEG data were recorded continuously and digitally stored. Description: The posterior dominant rhythm consists of 8 Hz activity of moderate voltage (25-35 uV) seen predominantly in posterior head regions, symmetric and reactive to eye opening and eye closing. Sleep was characterized by vertex waves, sleep spindles (12 to 14 Hz), maximal frontocentral region. EEG showed intermittent 3 to 6 Hz theta-delta slowing in right temporoparietal region. Hyperventilation and photic stimulation were not performed.   ABNORMALITY - Intermittent slow, right temporoparietal region. IMPRESSION: This study is suggestive of cortical dysfunction arising from right temporo-parietal region, nonspecific but likely secondary to underlying structural abnormality. No seizures or epileptiform discharges were seen throughout the recording. Priyanka Barbra Sarks   DG FL GUIDED LUMBAR PUNCTURE  Result Date: 10/06/2020 CLINICAL DATA:  Hallucinations, confusion, history hypertension EXAM: DIAGNOSTIC LUMBAR PUNCTURE UNDER FLUOROSCOPIC GUIDANCE  COMPARISON:  MR brain 10/05/2020 FLUOROSCOPY TIME:  Fluoroscopy Time:  0 minutes 6 seconds Radiation Exposure Index (if provided by the fluoroscopic device): 9.3 Number of Acquired Spot Images: 1 PROCEDURE: Procedure, benefits, and risks were discussed with the patient, including alternatives. Patient's questions were answered. Written informed consent was obtained. Timeout protocol followed. Patient placed prone. L3-L4 disc space was localized under fluoroscopy. Skin prepped and draped in usual sterile fashion. Skin and soft tissues anesthetized with 5 mL of 1% lidocaine. 22 gauge needle was advanced into the spinal canal under oblique approach where clear colorless CSF was encountered with an opening pressure of 3 cm H2O (prone). 10 mL of CSF was obtained in 4 tubes for requested analysis. Procedure tolerated very well by patient without immediate complication. IMPRESSION: Fluoroscopic guided lumbar puncture as above. Electronically Signed   By: Lavonia Dana M.D.   On: 10/06/2020 13:07    Microbiology: Recent Results (from the past 240 hour(s))  Resp Panel by RT-PCR (Flu A&B, Covid) Nasopharyngeal Swab     Status: None   Collection Time: 10/05/20  9:33 PM  Specimen: Nasopharyngeal Swab; Nasopharyngeal(NP) swabs in vial transport medium  Result Value Ref Range Status   SARS Coronavirus 2 by RT PCR NEGATIVE NEGATIVE Final    Comment: (NOTE) SARS-CoV-2 target nucleic acids are NOT DETECTED.  The SARS-CoV-2 RNA is generally detectable in upper respiratory specimens during the acute phase of infection. The lowest concentration of SARS-CoV-2 viral copies this assay can detect is 138 copies/mL. A negative result does not preclude SARS-Cov-2 infection and should not be used as the sole basis for treatment or other patient management decisions. A negative result may occur with  improper specimen collection/handling, submission of specimen other than nasopharyngeal swab, presence of viral mutation(s)  within the areas targeted by this assay, and inadequate number of viral copies(<138 copies/mL). A negative result must be combined with clinical observations, patient history, and epidemiological information. The expected result is Negative.  Fact Sheet for Patients:  EntrepreneurPulse.com.au  Fact Sheet for Healthcare Providers:  IncredibleEmployment.be  This test is no t yet approved or cleared by the [place] FDA and  has been authorized for detection and/or diagnosis of SARS-CoV-2 by FDA under an Emergency Use Authorization (EUA). This EUA will remain  in effect (meaning this test can be used) for the duration of the COVID-19 declaration under Section 564(b)(1) of the Act, 21 U.S.C.section 360bbb-3(b)(1), unless the authorization is terminated  or revoked sooner.       Influenza A by PCR NEGATIVE NEGATIVE Final   Influenza B by PCR NEGATIVE NEGATIVE Final    Comment: (NOTE) The Xpert Xpress SARS-CoV-2/FLU/RSV plus assay is intended as an aid in the diagnosis of influenza from Nasopharyngeal swab specimens and should not be used as a sole basis for treatment. Nasal washings and aspirates are unacceptable for Xpert Xpress SARS-CoV-2/FLU/RSV testing.  Fact Sheet for Patients: EntrepreneurPulse.com.au  Fact Sheet for Healthcare Providers: IncredibleEmployment.be  This test is not yet approved or cleared by the [place] FDA and has been authorized for detection and/or diagnosis of SARS-CoV-2 by FDA under an Emergency Use Authorization (EUA). This EUA will remain in effect (meaning this test can be used) for the duration of the COVID-19 declaration under Section 564(b)(1) of the Act, 21 U.S.C. section 360bbb-3(b)(1), unless the authorization is terminated or revoked.  Performed at [facility], [address] [place], [place] [address]   Culture, blood (routine x 2)     Status: None    Collection Time: 10/06/20 12:18 AM   Specimen: BLOOD RIGHT HAND  Result Value Ref Range Status   Specimen Description BLOOD RIGHT HAND  Final   Special Requests   Final    BOTTLES DRAWN AEROBIC AND ANAEROBIC Blood Culture adequate volume   Culture   Final    NO GROWTH 5 DAYS Performed at [facility], [address], [place], [place] [address]    Report Status 10/11/2020 FINAL  Final  Culture, blood (routine x 2)     Status: None   Collection Time: 10/06/20 12:25 AM   Specimen: BLOOD LEFT HAND  Result Value Ref Range Status   Specimen Description BLOOD LEFT HAND  Final   Special Requests AEROBIC BOTTLE ONLY Blood Culture adequate volume  Final   Culture   Final    NO GROWTH 5 DAYS Performed at [facility], [address] [place], [place] [address]    Report Status 10/11/2020 FINAL  Final  Culture, fungus without smear     Status: None (Preliminary result)   Collection Time: 10/06/20  9:56 AM  Specimen: CSF; Other  Result Value Ref Range Status   Specimen Description CSF  Final   Special Requests NONE  Final   Culture   Final    NO FUNGUS ISOLATED AFTER 9 DAYS Performed at [facility], [address], [place], [place] [address]    Report Status PENDING  Incomplete  Anaerobic culture w Gram Stain     Status: None   Collection Time: 10/06/20  9:57 AM   Specimen: CSF  Result Value Ref Range Status   Specimen Description   Final    CSF Performed at [facility], [address] [place], [place] [address]    Special Requests   Final    NONE Performed at [facility], [address] [place], [place] [address]    Culture   Final    NO ANAEROBES ISOLATED Performed at [facility], [place] [address] [place], [place] [address]    Report Status 10/12/2020 FINAL  Final  CSF culture w Gram Stain     Status: None   Collection Time: 10/06/20 11:15 AM   Specimen: CSF; Cerebrospinal Fluid  Result Value Ref Range Status   Specimen Description   Final    CSF Performed  at [facility], [address], [place], [place] [address]    Special Requests   Final    NONE Performed at [facility], [address], [place], [place] [address]    Gram Stain   Final    NO ORGANISMS SEEN NO WBC SEEN Performed at [facility], [address], [place], [place] [address]    Culture   Final    NO GROWTH 3 DAYS Performed at [facility], [place] [address], [place], [place] [address]    Report Status 10/10/2020 FINAL  Final  SARS CORONAVIRUS 2 (TAT 6-24 HRS) Nasopharyngeal Nasopharyngeal Swab     Status: None   Collection Time: 10/11/20  3:00 AM   Specimen: Nasopharyngeal Swab  Result Value Ref Range Status   SARS Coronavirus 2 NEGATIVE NEGATIVE Final    Comment: (NOTE) SARS-CoV-2 target nucleic acids are NOT DETECTED.  The SARS-CoV-2 RNA is generally detectable in upper and lower respiratory specimens during the acute phase of infection. Negative results do not preclude SARS-CoV-2 infection, do not rule out co-infections with other pathogens, and should not be used as the sole basis for treatment or other patient management decisions. Negative results must be combined with clinical observations, patient history, and epidemiological information. The expected result is Negative.  Fact Sheet for Patients: SugarRoll.be  Fact Sheet for Healthcare Providers: https://www.woods-mathews.com/  This test is not yet approved or cleared by the [place] FDA and  has been authorized for detection and/or diagnosis of SARS-CoV-2 by FDA under an Emergency Use Authorization (EUA). This EUA will remain  in effect (meaning this test can be used) for the duration of the COVID-19 declaration under Se ction 564(b)(1) of the Act, 21 U.S.C. section 360bbb-3(b)(1), unless the authorization is terminated or revoked sooner.  Performed at [facility], [place] [address], [place], [place] [address]   Surgical pcr screen     Status:  None   Collection Time: 10/14/20  1:01 PM   Specimen: Nasal Mucosa; Nasal Swab  Result Value Ref Range Status   MRSA, PCR NEGATIVE NEGATIVE Final   Staphylococcus aureus NEGATIVE NEGATIVE Final    Comment: (NOTE) The Xpert SA Assay (FDA approved for NASAL specimens in patients 60 years of age and  older), is one component of a comprehensive surveillance program. It is not intended to diagnose infection nor to guide or monitor treatment. Performed at [facility], [place] [address], [place], [place] [address]      Labs: Basic Metabolic Panel: Recent Labs  Lab 10/11/20 0051 10/11/20 0518 10/12/20 0439 10/13/20 0554 10/15/20 0528  NA 138 139 141 139 140  K 3.8 3.9 3.4* 3.8 3.6  CL 109 109 112* 112* 110  CO2 21* 21* 23 22 21*  GLUCOSE 111* 102* 91 100* 102*  BUN '14 13 15 15 12  '$ CREATININE 1.67* 1.65* 1.79* 1.82* 1.61*  CALCIUM 9.7 9.9 9.3 8.9 9.4  MG  --  1.6* 1.9  --   --   PHOS  --  2.6  --   --   --    Liver Function Tests: Recent Labs  Lab 10/11/20 0051 10/11/20 0518 10/12/20 0439  AST '19 19 17  '$ ALT '18 17 14  '$ ALKPHOS 80 74 63  BILITOT 0.4 0.4 0.6  PROT 6.6 6.0* 5.3*  ALBUMIN 3.5 3.3* 2.9*   No results for input(s): LIPASE, AMYLASE in the last 168 hours. No results for input(s): AMMONIA in the last 168 hours. CBC: Recent Labs  Lab 10/11/20 0051 10/11/20 0518 10/15/20 0528  WBC 9.7 7.6 4.5  NEUTROABS 8.4*  --   --   HGB 12.2 11.7* 11.0*  HCT 38.2 37.1 33.9*  MCV 85.8 85.7 85.0  PLT 223 221 173   Cardiac Enzymes: No results for input(s): CKTOTAL, CKMB, CKMBINDEX, TROPONINI in the last 168 hours. BNP: BNP (last 3 results) No results for input(s): BNP in the last 8760 hours.  ProBNP (last 3 results) No results for input(s): PROBNP in the last 8760 hours.  CBG: No results for input(s): GLUCAP in the last 168 hours.     Signed:  Annita Brod, MD [facility] 10/15/2020, 4:45 PM

## 2020-10-15 NOTE — Procedures (Addendum)
Patient Name: KHAMIYAH HUCKLEBY  MRN: [id number]  Epilepsy Attending: Lora Havens  Referring Physician/Provider: Dr Phillips Odor Duration: 10/14/2020 1617 to 10/15/2020 1617  Patient history: 80 year old female with progressive visual hallucinations.  EEG to evaluate for seizures.  Level of alertness: Awake, asleep  AEDs during EEG study: None  Technical aspects: This EEG study was done with scalp electrodes positioned according to the 10-20 International system of electrode placement. Electrical activity was acquired at a sampling rate of '500Hz'$  and reviewed with a high frequency filter of '70Hz'$  and a low frequency filter of '1Hz'$ . EEG data were recorded continuously and digitally stored.   Description: The posterior dominant rhythm consists of 8 Hz activity of moderate voltage (25-35 uV) seen predominantly in posterior head regions, symmetric and reactive to eye opening and eye closing. Sleep was characterized by vertex waves, sleep spindles (12 to 14 Hz), maximal frontocentral region. EEG showed intermittent 3 to 6 Hz theta-delta slowing in right temporoparietal region. Hyperventilation and photic stimulation were not performed.     ABNORMALITY - Intermittent slow, right temporoparietal region.   IMPRESSION: This study is suggestive of cortical dysfunction arising from right temporo-parietal region, nonspecific but likely secondary to underlying structural abnormality. No seizures or epileptiform discharges were seen throughout the recording.  Karne Ozga Barbra Sarks

## 2020-10-16 DIAGNOSIS — N184 Chronic kidney disease, stage 4 (severe): Secondary | ICD-10-CM | POA: Diagnosis present

## 2020-10-16 NOTE — Plan of Care (Signed)
  Problem: Education: Goal: Knowledge of General Education information will improve Description: Including pain rating scale, medication(s)/side effects and non-pharmacologic comfort measures 10/16/2020 1459 by Quintin Alto, RN Outcome: Adequate for Discharge 10/16/2020 1326 by Quintin Alto, RN Outcome: Progressing   Problem: Health Behavior/Discharge Planning: Goal: Ability to manage health-related needs will improve 10/16/2020 1459 by Quintin Alto, RN Outcome: Adequate for Discharge 10/16/2020 1326 by Quintin Alto, RN Outcome: Progressing   Problem: Clinical Measurements: Goal: Ability to maintain clinical measurements within normal limits will improve 10/16/2020 1459 by Quintin Alto, RN Outcome: Adequate for Discharge 10/16/2020 1326 by Quintin Alto, RN Outcome: Progressing Goal: Will remain free from infection 10/16/2020 1459 by Quintin Alto, RN Outcome: Adequate for Discharge 10/16/2020 1326 by Quintin Alto, RN Outcome: Progressing Goal: Diagnostic test results will improve 10/16/2020 1459 by Quintin Alto, RN Outcome: Adequate for Discharge 10/16/2020 1326 by Quintin Alto, RN Outcome: Progressing Goal: Respiratory complications will improve 10/16/2020 1459 by Quintin Alto, RN Outcome: Adequate for Discharge 10/16/2020 1326 by Quintin Alto, RN Outcome: Progressing Goal: Cardiovascular complication will be avoided 10/16/2020 1459 by Quintin Alto, RN Outcome: Adequate for Discharge 10/16/2020 1326 by Quintin Alto, RN Outcome: Progressing   Problem: Activity: Goal: Risk for activity intolerance will decrease 10/16/2020 1459 by Quintin Alto, RN Outcome: Adequate for Discharge 10/16/2020 1326 by Quintin Alto, RN Outcome: Progressing   Problem: Nutrition: Goal: Adequate nutrition will be maintained 10/16/2020 1459 by Quintin Alto, RN Outcome: Adequate for Discharge 10/16/2020 1326 by Quintin Alto, RN Outcome: Progressing   Problem:  Coping: Goal: Level of anxiety will decrease 10/16/2020 1459 by Quintin Alto, RN Outcome: Adequate for Discharge 10/16/2020 1326 by Quintin Alto, RN Outcome: Progressing   Problem: Elimination: Goal: Will not experience complications related to bowel motility 10/16/2020 1459 by Quintin Alto, RN Outcome: Adequate for Discharge 10/16/2020 1326 by Quintin Alto, RN Outcome: Progressing Goal: Will not experience complications related to urinary retention 10/16/2020 1459 by Quintin Alto, RN Outcome: Adequate for Discharge 10/16/2020 1326 by Quintin Alto, RN Outcome: Progressing   Problem: Pain Managment: Goal: General experience of comfort will improve 10/16/2020 1459 by Quintin Alto, RN Outcome: Adequate for Discharge 10/16/2020 1326 by Quintin Alto, RN Outcome: Progressing   Problem: Safety: Goal: Ability to remain free from injury will improve 10/16/2020 1459 by Quintin Alto, RN Outcome: Adequate for Discharge 10/16/2020 1326 by Quintin Alto, RN Outcome: Progressing   Problem: Skin Integrity: Goal: Risk for impaired skin integrity will decrease 10/16/2020 1459 by Quintin Alto, RN Outcome: Adequate for Discharge 10/16/2020 1326 by Quintin Alto, RN Outcome: Progressing

## 2020-10-16 NOTE — Progress Notes (Signed)
vLTM EEG complete. No skin breakdown 

## 2020-10-16 NOTE — Plan of Care (Signed)

## 2020-10-16 NOTE — Procedures (Signed)
Patient Name: ELLISEN VIVIRITO  MRN: [id number]  Epilepsy Attending: Lora Havens  Referring Physician/Provider: Dr Phillips Odor Duration: 10/15/2020 1617 to 10/16/2020 [id number]   Patient history: 80 year old female with progressive visual hallucinations.  EEG to evaluate for seizures.   Level of alertness: Awake, asleep   AEDs during EEG study: None   Technical aspects: This EEG study was done with scalp electrodes positioned according to the 10-20 International system of electrode placement. Electrical activity was acquired at a sampling rate of '500Hz'$  and reviewed with a high frequency filter of '70Hz'$  and a low frequency filter of '1Hz'$ . EEG data were recorded continuously and digitally stored.   Description: The posterior dominant rhythm consists of 8 Hz activity of moderate voltage (25-35 uV) seen predominantly in posterior head regions, symmetric and reactive to eye opening and eye closing. Sleep was characterized by vertex waves, sleep spindles (12 to 14 Hz), maximal frontocentral region. EEG showed intermittent 3 to 6 Hz theta-delta slowing in right temporoparietal region. Hyperventilation and photic stimulation were not performed.      ABNORMALITY - Intermittent slow, right temporoparietal region.    IMPRESSION: This study is suggestive of cortical dysfunction arising from right temporo-parietal region, nonspecific but likely secondary to underlying structural abnormality. No seizures or epileptiform discharges were seen throughout the recording.   Davonne Baby Barbra Sarks

## 2020-10-16 NOTE — Discharge Summary (Signed)
Discharge Summary  Allison Watson [id number] DOB: [birth year]  PCP: Lucia Gaskins, MD  Admit date: 10/10/2020 Discharge date: 10/16/2020  Time spent: 25 minutes  Recommendations for Outpatient Follow-up:  Patient will resume all of her normal home medications, including ones that have been recently started such as Zyprexa and Topamax, including ones that have been started during her most recent hospitalization including Zyprexa Patient will follow up with her PCP in the next 2 to 3 weeks  Discharge Diagnoses:  Active Hospital Problems   Diagnosis Date Noted   Acute metabolic encephalopathy [id number]   Obesity (BMI 30-39.9) 10/11/2020   Hypomagnesemia 10/11/2020   Anemia due to chronic kidney disease 10/11/2020   INSOMNIA, CHRONIC 05/08/2007   Essential hypertension 03/13/2006    Resolved Hospital Problems  No resolved problems to display.    Discharge Condition: Improved, being discharged home  Diet recommendation: Low-sodium  Vitals:   10/16/20 0349 10/16/20 1128  BP: (!) 158/70 (!) 151/70  Pulse: (!) 55 64  Resp: 20 18  Temp: 98.1 F (36.7 C) 98 F (36.7 C)  SpO2: 95% 97%    History of present illness:  80 year old with past medical history of hypertension, insomnia and obesity who had been recently admitted for acute metabolic encephalopathy causing hallucinations and was discharged on 6/18 after symptoms felt to be possibly related to sleep medication.  However following discharge, patient's symptoms recurred rapidly and she was brought back to the emergency room on 6/19.  Neurology consulted.  B12 severely deficient and patient started on supplementation.  Patient's symptoms improved.  Underwent MRI which noted abnormality seen involving the pia matter in the right parietal/occipital region concerning for malignancy.  Spinal tap unremarkable.  Patient placed on Depakote.  Concerns that this could be related to chronic underlying seizures and 24-hour EEG  recommended.  Hospital Course:  Principal Problem:   Acute metabolic encephalopathy Active Problems:   INSOMNIA, CHRONIC   Essential hypertension   Obesity (BMI 30-39.9)   Hypomagnesemia   Anemia due to chronic kidney disease Principal Problem:   Acute metabolic encephalopathy with hallucinations:?  Seizure.?  Related to malignancy.  Seen by neurology, who recommended 24-hour EEG, and thus patient was transferred to [facility].  24-hour EEG continuous monitoring followed by 12-hour.  Finished on 6/25 morning.  Preliminary results noted no evidence of seizure activity.  Some cortical dysfunction arising from the right temporoparietal region and nonspecific but likely secondary to underlying structural abnormality.  Case discussed with neurosurgery, but without solid structure, biopsy not recommended.  Patient mentioned that she did not think she would want a brain biopsy.  By day of discharge, patient's mentation back to baseline. Active Problems:   INSOMNIA, CHRONIC   Essential hypertension: Borderline hypotension.  Monitoring closely.     Obesity (BMI 30-39.9): Patient meets criteria for BMI greater than 30.     Hypomagnesemia: Replacing as needed.     Anemia due to chronic kidney disease: Patient with chronic stage IV kidney disease.  Appears to be at baseline.  Hemoglobin stable, also at baseline.  Received IV fluids to help with hypotension.  Procedures: 24-hour EEG: Final report pending.  No evidence of epileptiform discharges.  Some cortical dysfunction at the right temporoparietal region, although this is nonspecific and likely secondary to structural abnormality.  Consultations: Neurology  Discharge Exam: BP (!) 151/70 (BP Location: Right Arm)   Pulse 64   Temp 98 F (36.7 C)   Resp 18   Ht '4\' 11"'$  (1.499 m)  Wt 80 kg   SpO2 97%   BMI 35.62 kg/m   General: Alert and oriented x3, no acute distress Cardiovascular: Regular rate and rhythm, S1-S2 Respiratory: Clear to  auscultation bilaterally  Discharge Instructions You were cared for by a hospitalist during your hospital stay. If you have any questions about your discharge medications or the care you received while you were in the hospital after you are discharged, you can call the unit and asked to speak with the hospitalist on call if the hospitalist that took care of you is not available. Once you are discharged, your primary care physician will handle any further medical issues. Please note that NO REFILLS for any discharge medications will be authorized once you are discharged, as it is imperative that you return to your primary care physician (or establish a relationship with a primary care physician if you do not have one) for your aftercare needs so that they can reassess your need for medications and monitor your lab values.  Discharge Instructions     Diet - low sodium heart healthy   Complete by: As directed    Increase activity slowly   Complete by: As directed       Allergies as of 10/16/2020       Reactions   Codeine Rash   Lorazepam Other (See Comments)   Hallucinations per daughter Allison Watson   Penicillins Rash   Has patient had a PCN reaction causing immediate rash, facial/tongue/throat swelling, SOB or lightheadedness with hypotension: Yes Has patient had a PCN reaction causing severe rash involving mucus membranes or skin necrosis: No Has patient had a PCN reaction that required hospitalization No Has patient had a PCN reaction occurring within the last 10 years: No If all of the above answers are "NO", then may proceed with Cephalosporin use.        Medication List     STOP taking these medications    oxyCODONE 5 MG immediate release tablet Commonly known as: Oxy IR/ROXICODONE       TAKE these medications    albuterol 108 (90 Base) MCG/ACT inhaler Commonly known as: VENTOLIN HFA Inhale 1-2 puffs into the lungs every 6 (six) hours as needed for wheezing or shortness of  breath. Will use when too hot/ too cold outside   cholecalciferol 25 MCG (1000 UNIT) tablet Commonly known as: VITAMIN D3 Take 1,000 Units by mouth daily.   diltiazem 240 MG 24 hr capsule Commonly known as: CARDIZEM CD Take 240 mg by mouth daily.   hydrALAZINE 100 MG tablet Commonly known as: APRESOLINE Take 1 tablet (100 mg total) by mouth 2 (two) times daily.   labetalol 100 MG tablet Commonly known as: NORMODYNE Take 1 tablet (100 mg total) by mouth 2 (two) times daily.   lisinopril-hydrochlorothiazide 20-12.5 MG tablet Commonly known as: ZESTORETIC Take 1 tablet by mouth daily.   naproxen 500 MG tablet Commonly known as: NAPROSYN Take 500 mg by mouth daily as needed (pain in arm).   OLANZapine 10 MG tablet Commonly known as: ZYPREXA Take 1 tablet (10 mg total) by mouth at bedtime.   topiramate 100 MG tablet Commonly known as: TOPAMAX Take 1 tablet (100 mg total) by mouth 2 (two) times daily.        Allergies  Allergen Reactions   Codeine Rash   Lorazepam Other (See Comments)    Hallucinations per daughter Allison Watson   Penicillins Rash    Has patient had a PCN reaction causing immediate rash, facial/tongue/throat swelling,  SOB or lightheadedness with hypotension: Yes Has patient had a PCN reaction causing severe rash involving mucus membranes or skin necrosis: No Has patient had a PCN reaction that required hospitalization No Has patient had a PCN reaction occurring within the last 10 years: No If all of the above answers are "NO", then may proceed with Cephalosporin use.       The results of significant diagnostics from this hospitalization (including imaging, microbiology, ancillary and laboratory) are listed below for reference.    Significant Diagnostic Studies: DG Chest 1 View  Result Date: 10/11/2020 CLINICAL DATA:  Altered mental status. EXAM: CHEST  1 VIEW COMPARISON:  05/14/2013 FINDINGS: The heart size and mediastinal contours are within normal  limits. Both lungs are clear. The visualized skeletal structures are unremarkable. IMPRESSION: No active disease. Electronically Signed   By: Kerby Moors M.D.   On: 10/11/2020 00:57   MR Brain W and Wo Contrast  Result Date: 10/05/2020 CLINICAL DATA:  Psychosis.  Vision changes. EXAM: MRI HEAD WITHOUT AND WITH CONTRAST TECHNIQUE: Multiplanar, multiecho pulse sequences of the brain and surrounding structures were obtained without and with intravenous contrast. CONTRAST:  21m GADAVIST GADOBUTROL 1 MMOL/ML IV SOLN midline shift. COMPARISON:  CT head March 2021. FINDINGS: Brain: Abnormal sulcal FLAIR hyperintensity and curvilinear pial enhancement involving the posterior right cerebrum, including the parietal lobe, occipital lobe and posterior right temporal lobe. No restricted diffusion to suggest acute infarct. No subjacent encephalomalacia or associated susceptibility artifact. Mild atrophy with ex vacuo ventricular dilation. No hydrocephalus. Moderate scattered T2/FLAIR hyperintensities within the white matter, which are nonspecific but most likely related to chronic microvascular ischemic disease. No sizable extra-axial fluid collection. No mass lesion or midline shift. Vascular: Major arterial flow voids are maintained skull base. Skull and upper cervical spine: Normal marrow signal. Sinuses/Orbits: Left maxillary sinus retention cyst. Unremarkable orbits. Other: No mastoid effusions. IMPRESSION: 1. Abnormal focal sulcal FLAIR hyperintensity and pial enhancement involving the posterior right cerebrum, including the right parietal, occipital and posterior temporal lobes. Findings are nonspecific with differential considerations including meningitis/encephalitis, postictal hyperemia, neurosarcoidosis, vasculitis, and leptomeningeal carcinomatosis. Recommend neurology consultation and correlation with lumbar puncture. 2. Moderate chronic microvascular ischemic disease. Findings discussed with Dr. PAlvino Chapelvia  telephone at 4:45 PM. Electronically Signed   By: FMargaretha SheffieldMD   On: 10/05/2020 16:54   EEG adult  Result Date: 10/08/2020 DPhillips Odor MD     10/08/2020  8:13 PM HBayshore GardensA. DMerlene Laughter MD     [email or web address]       HISTORY: 80year old who presents with altered mental status.  The studies been done to evaluate for seizures. MEDICATIONS: Current Facility-Administered Medications:   0.9 %  sodium chloride infusion, , Intravenous, Continuous, Emokpae, Courage, MD, Last Rate: 50 mL/hr at 10/08/20 1808, New Bag at 10/08/20 1808   acetaminophen (TYLENOL) tablet 650 mg, 650 mg, Oral, Q6H PRN **OR** acetaminophen (TYLENOL) suppository 650 mg, 650 mg, Rectal, Q6H PRN, Zierle-Ghosh, Asia B, DO   hydrALAZINE (APRESOLINE) injection 10 mg, 10 mg, Intravenous, Q6H PRN, Emokpae, Courage, MD   labetalol (NORMODYNE) tablet 100 mg, 100 mg, Oral, BID, Zierle-Ghosh, Asia B, DO, 100 mg at 10/08/20 1031   LORazepam (ATIVAN) tablet 0.5 mg, 0.5 mg, Oral, QHS PRN, Zierle-Ghosh, Asia B, DO   OLANZapine (ZYPREXA) tablet 7.5 mg, 7.5 mg, Oral, QHS, [place], Kofi, MD, 7.5 mg at 10/07/20 2204   ondansetron (ZOFRAN) tablet 4 mg, 4 mg, Oral, Q6H PRN **OR** ondansetron (ZOFRAN) injection 4 mg, 4 mg,  Intravenous, Q6H PRN, Zierle-Ghosh, Asia B, DO   topiramate (TOPAMAX) tablet 50 mg, 50 mg, Oral, BID, Emokpae, Courage, MD, 50 mg at 10/08/20 1032   verapamil (CALAN) tablet 120 mg, 120 mg, Oral, BID, Zierle-Ghosh, Asia B, DO, 120 mg at 10/08/20 1032 ANALYSIS: A 16 channel recording using standard 10 20 measurements is conducted for 23 minutes.  The background rhythm is that of 7 to 7.5 Hz.  There is beta activity observed in frontal areas.  Awake and sleep architecture is noted.  Sleep spindles and rudimentary K complexes are noted.  Vertex sharp waves are also noted.  Photic stimulation and hyperventilation are not conducted.  There is no focal or lateral slowing.  There is no epileptiform activity is noted.  IMPRESSION: 1.  This recording of the awake and sleep state shows mild global slowing indicating a mild global encephalopathy.  Otherwise, no epileptiform discharges are noted. Kofi A. Merlene Laughter, M.D. Diplomate, Tax adviser of Psychiatry and Neurology ( Neurology).   Overnight EEG with video  Result Date: 10/15/2020 Lora Havens, MD     10/16/2020  9:21 AM Patient Name: Allison Watson MRN: [id number] Epilepsy Attending: Lora Havens Referring Physician/Provider: Dr Phillips Odor Duration: 10/14/2020 1617 to 10/15/2020 1617 Patient history: 80 year old female with progressive visual hallucinations.  EEG to evaluate for seizures. Level of alertness: Awake, asleep AEDs during EEG study: None Technical aspects: This EEG study was done with scalp electrodes positioned according to the 10-20 International system of electrode placement. Electrical activity was acquired at a sampling rate of '500Hz'$  and reviewed with a high frequency filter of '70Hz'$  and a low frequency filter of '1Hz'$ . EEG data were recorded continuously and digitally stored. Description: The posterior dominant rhythm consists of 8 Hz activity of moderate voltage (25-35 uV) seen predominantly in posterior head regions, symmetric and reactive to eye opening and eye closing. Sleep was characterized by vertex waves, sleep spindles (12 to 14 Hz), maximal frontocentral region. EEG showed intermittent 3 to 6 Hz theta-delta slowing in right temporoparietal region. Hyperventilation and photic stimulation were not performed.   ABNORMALITY - Intermittent slow, right temporoparietal region. IMPRESSION: This study is suggestive of cortical dysfunction arising from right temporo-parietal region, nonspecific but likely secondary to underlying structural abnormality. No seizures or epileptiform discharges were seen throughout the recording. Priyanka Barbra Sarks   DG FL GUIDED LUMBAR PUNCTURE  Result Date: 10/06/2020 CLINICAL DATA:  Hallucinations, confusion,  history hypertension EXAM: DIAGNOSTIC LUMBAR PUNCTURE UNDER FLUOROSCOPIC GUIDANCE COMPARISON:  MR brain 10/05/2020 FLUOROSCOPY TIME:  Fluoroscopy Time:  0 minutes 6 seconds Radiation Exposure Index (if provided by the fluoroscopic device): 9.3 Number of Acquired Spot Images: 1 PROCEDURE: Procedure, benefits, and risks were discussed with the patient, including alternatives. Patient's questions were answered. Written informed consent was obtained. Timeout protocol followed. Patient placed prone. L3-L4 disc space was localized under fluoroscopy. Skin prepped and draped in usual sterile fashion. Skin and soft tissues anesthetized with 5 mL of 1% lidocaine. 22 gauge needle was advanced into the spinal canal under oblique approach where clear colorless CSF was encountered with an opening pressure of 3 cm H2O (prone). 10 mL of CSF was obtained in 4 tubes for requested analysis. Procedure tolerated very well by patient without immediate complication. IMPRESSION: Fluoroscopic guided lumbar puncture as above. Electronically Signed   By: Lavonia Dana M.D.   On: 10/06/2020 13:07    Microbiology: Recent Results (from the past 240 hour(s))  SARS CORONAVIRUS 2 (TAT 6-24 HRS) Nasopharyngeal Nasopharyngeal Swab  Status: None   Collection Time: 10/11/20  3:00 AM   Specimen: Nasopharyngeal Swab  Result Value Ref Range Status   SARS Coronavirus 2 NEGATIVE NEGATIVE Final    Comment: (NOTE) SARS-CoV-2 target nucleic acids are NOT DETECTED.  The SARS-CoV-2 RNA is generally detectable in upper and lower respiratory specimens during the acute phase of infection. Negative results do not preclude SARS-CoV-2 infection, do not rule out co-infections with other pathogens, and should not be used as the sole basis for treatment or other patient management decisions. Negative results must be combined with clinical observations, patient history, and epidemiological information. The expected result is Negative.  Fact Sheet for  Patients: SugarRoll.be  Fact Sheet for Healthcare Providers: https://www.woods-mathews.com/  This test is not yet approved or cleared by the [place] FDA and  has been authorized for detection and/or diagnosis of SARS-CoV-2 by FDA under an Emergency Use Authorization (EUA). This EUA will remain  in effect (meaning this test can be used) for the duration of the COVID-19 declaration under Se ction 564(b)(1) of the Act, 21 U.S.C. section 360bbb-3(b)(1), unless the authorization is terminated or revoked sooner.  Performed at [facility], [place] [address] [place], [place] [address]   Surgical pcr screen     Status: None   Collection Time: 10/14/20  1:01 PM   Specimen: Nasal Mucosa; Nasal Swab  Result Value Ref Range Status   MRSA, PCR NEGATIVE NEGATIVE Final   Staphylococcus aureus NEGATIVE NEGATIVE Final    Comment: (NOTE) The Xpert SA Assay (FDA approved for NASAL specimens in patients 29 years of age and older), is one component of a comprehensive surveillance program. It is not intended to diagnose infection nor to guide or monitor treatment. Performed at [facility], [place] [address], [place], [place] [address]      Labs: Basic Metabolic Panel: Recent Labs  Lab 10/11/20 0051 10/11/20 0518 10/12/20 0439 10/13/20 0554 10/15/20 0528  NA 138 139 141 139 140  K 3.8 3.9 3.4* 3.8 3.6  CL 109 109 112* 112* 110  CO2 21* 21* 23 22 21*  GLUCOSE 111* 102* 91 100* 102*  BUN '14 13 15 15 12  '$ CREATININE 1.67* 1.65* 1.79* 1.82* 1.61*  CALCIUM 9.7 9.9 9.3 8.9 9.4  MG  --  1.6* 1.9  --   --   PHOS  --  2.6  --   --   --     Liver Function Tests: Recent Labs  Lab 10/11/20 0051 10/11/20 0518 10/12/20 0439  AST '19 19 17  '$ ALT '18 17 14  '$ ALKPHOS 80 74 63  BILITOT 0.4 0.4 0.6  PROT 6.6 6.0* 5.3*  ALBUMIN 3.5 3.3* 2.9*    No results for input(s): LIPASE, AMYLASE in the last 168 hours. No results for input(s): AMMONIA in  the last 168 hours. CBC: Recent Labs  Lab 10/11/20 0051 10/11/20 0518 10/15/20 0528  WBC 9.7 7.6 4.5  NEUTROABS 8.4*  --   --   HGB 12.2 11.7* 11.0*  HCT 38.2 37.1 33.9*  MCV 85.8 85.7 85.0  PLT 223 221 173    Cardiac Enzymes: No results for input(s): CKTOTAL, CKMB, CKMBINDEX, TROPONINI in the last 168 hours. BNP: BNP (last 3 results) No results for input(s): BNP in the last 8760 hours.  ProBNP (last 3 results) No results for input(s): PROBNP in the last 8760 hours.  CBG: No results for input(s): GLUCAP in the last 168 hours.     Signed:  Annita Brod, MD [facility] 10/16/2020, 1:28 PM

## 2020-10-16 NOTE — Progress Notes (Signed)
Pt discharged home. AVS and prescriptions given. Pt being picked up by daughter. Pt escorted out via staff. Vitals stable. Pt has no questions at this time.

## 2020-10-27 LAB — CULTURE, FUNGUS WITHOUT SMEAR

## 2021-03-01 ENCOUNTER — Emergency Department (HOSPITAL_COMMUNITY)
Admission: EM | Admit: 2021-03-01 | Discharge: 2021-03-01 | Disposition: A | Discharge status: Home / Self Care | Payer: Medicare HMO | Attending: Emergency Medicine | Admitting: Emergency Medicine

## 2021-03-01 ENCOUNTER — Emergency Department (HOSPITAL_COMMUNITY): Payer: Medicare HMO

## 2021-03-01 ENCOUNTER — Encounter (HOSPITAL_COMMUNITY): Payer: Self-pay | Admitting: *Deleted

## 2021-03-01 DIAGNOSIS — S50312A Abrasion of left elbow, initial encounter: Secondary | ICD-10-CM | POA: Insufficient documentation

## 2021-03-01 DIAGNOSIS — Z79899 Other long term (current) drug therapy: Secondary | ICD-10-CM | POA: Insufficient documentation

## 2021-03-01 DIAGNOSIS — R410 Disorientation, unspecified: Secondary | ICD-10-CM | POA: Diagnosis not present

## 2021-03-01 DIAGNOSIS — R531 Weakness: Secondary | ICD-10-CM | POA: Diagnosis not present

## 2021-03-01 DIAGNOSIS — Z20822 Contact with and (suspected) exposure to covid-19: Secondary | ICD-10-CM | POA: Diagnosis not present

## 2021-03-01 DIAGNOSIS — N184 Chronic kidney disease, stage 4 (severe): Secondary | ICD-10-CM | POA: Diagnosis not present

## 2021-03-01 DIAGNOSIS — W19XXXA Unspecified fall, initial encounter: Secondary | ICD-10-CM | POA: Insufficient documentation

## 2021-03-01 DIAGNOSIS — I129 Hypertensive chronic kidney disease with stage 1 through stage 4 chronic kidney disease, or unspecified chronic kidney disease: Secondary | ICD-10-CM | POA: Insufficient documentation

## 2021-03-01 DIAGNOSIS — Z87891 Personal history of nicotine dependence: Secondary | ICD-10-CM | POA: Insufficient documentation

## 2021-03-01 DIAGNOSIS — S50311A Abrasion of right elbow, initial encounter: Secondary | ICD-10-CM | POA: Insufficient documentation

## 2021-03-01 DIAGNOSIS — S59901A Unspecified injury of right elbow, initial encounter: Secondary | ICD-10-CM | POA: Diagnosis present

## 2021-03-01 LAB — CBC WITH DIFFERENTIAL/PLATELET
Basophils Absolute: 0 10*3/uL (ref 0.0–0.1)
Basophils Relative: 0 %
Eosinophils Absolute: 0.1 10*3/uL (ref 0.0–0.5)
Eosinophils Relative: 3 %
HCT: 38.9 % (ref 36.0–46.0)
Hemoglobin: 12.6 g/dL (ref 12.0–15.0)
Lymphocytes Relative: 37 %
Lymphs Abs: 1.7 10*3/uL (ref 0.7–4.0)
MCH: 26.3 pg (ref 26.0–34.0)
MCHC: 32.4 g/dL (ref 30.0–36.0)
MCV: 81 fL (ref 80.0–100.0)
Monocytes Absolute: 0.4 10*3/uL (ref 0.1–1.0)
Monocytes Relative: 8 %
Neutro Abs: 2.4 10*3/uL (ref 1.7–7.7)
Neutrophils Relative %: 52 %
Platelets: 149 10*3/uL — ABNORMAL LOW (ref 150–400)
RBC: 4.8 MIL/uL (ref 3.87–5.11)
RDW: 17.5 % — ABNORMAL HIGH (ref 11.5–15.5)
WBC: 4.6 10*3/uL (ref 4.0–10.5)
nRBC: 0 % (ref 0.0–0.2)

## 2021-03-01 LAB — URINALYSIS, ROUTINE W REFLEX MICROSCOPIC
Bilirubin Urine: NEGATIVE
Glucose, UA: NEGATIVE mg/dL
Hgb urine dipstick: NEGATIVE
Ketones, ur: 5 mg/dL — AB
Leukocytes,Ua: NEGATIVE
Nitrite: NEGATIVE
Protein, ur: NEGATIVE mg/dL
Specific Gravity, Urine: 1.027 (ref 1.005–1.030)
pH: 5 (ref 5.0–8.0)

## 2021-03-01 LAB — BASIC METABOLIC PANEL
Anion gap: 9 (ref 5–15)
BUN: 17 mg/dL (ref 8–23)
CO2: 25 mmol/L (ref 22–32)
Calcium: 10.2 mg/dL (ref 8.9–10.3)
Chloride: 108 mmol/L (ref 98–111)
Creatinine, Ser: 1.42 mg/dL — ABNORMAL HIGH (ref 0.44–1.00)
GFR, Estimated: 38 mL/min — ABNORMAL LOW (ref >60)
Glucose, Bld: 98 mg/dL (ref 70–99)
Potassium: 3.6 mmol/L (ref 3.5–5.1)
Sodium: 142 mmol/L (ref 135–145)

## 2021-03-01 LAB — LACTIC ACID, PLASMA
Lactic Acid, Venous: 1.1 mmol/L (ref 0.5–1.9)
Lactic Acid, Venous: 0.9 mmol/L (ref 0.5–1.9)

## 2021-03-01 LAB — RESP PANEL BY RT-PCR (FLU A&B, COVID) ARPGX2
Influenza A by PCR: NEGATIVE
Influenza B by PCR: NEGATIVE
SARS Coronavirus 2 by RT PCR: NEGATIVE

## 2021-03-01 IMAGING — CT CT HEAD W/O CM
3 series · 15 of 47 positions shown, 18 images · non-contrast
Comparison: MRI [DATE]

CLINICAL DATA: Mental status change

EXAM:
CT HEAD WITHOUT CONTRAST
TECHNIQUE: Contiguous axial images were obtained from the base of the skull
through the vertex without intravenous contrast.

[Series 2: head w o · axial · 0.43mm/px · z∈[+21,+151]mm · 9 of 32 slices shown, 12 images]
[im 3/32  brain]
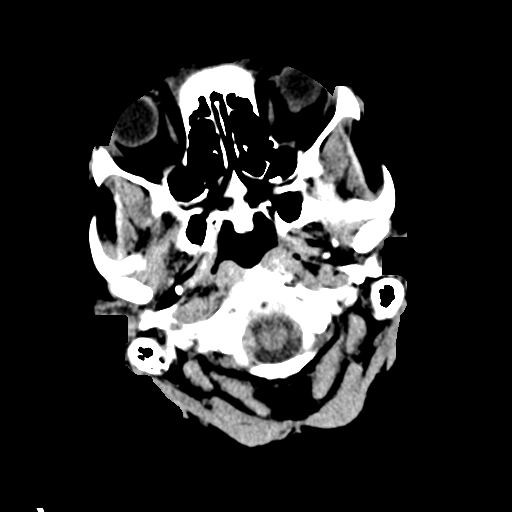
[im 3/32  bone]
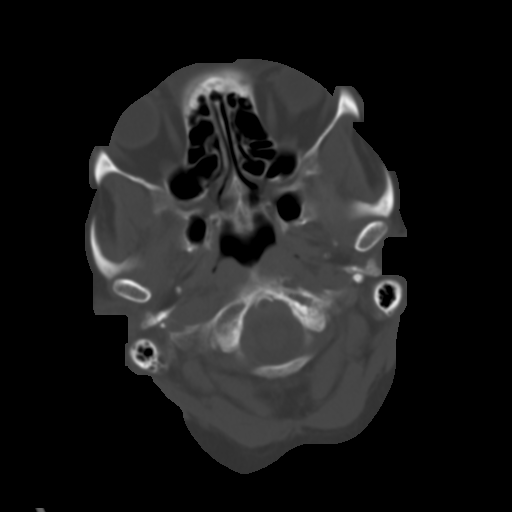
[im 6/32  brain]
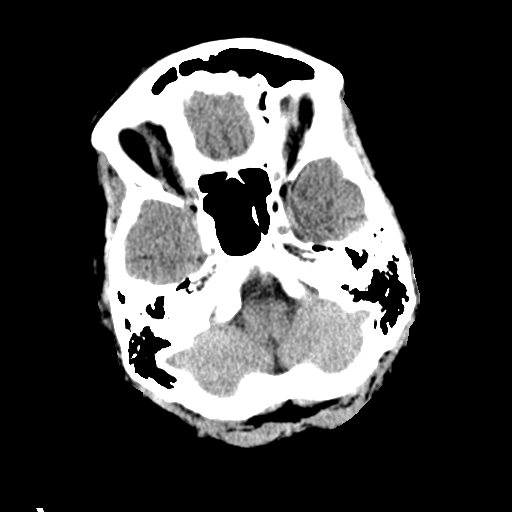
[im 9/32  brain]
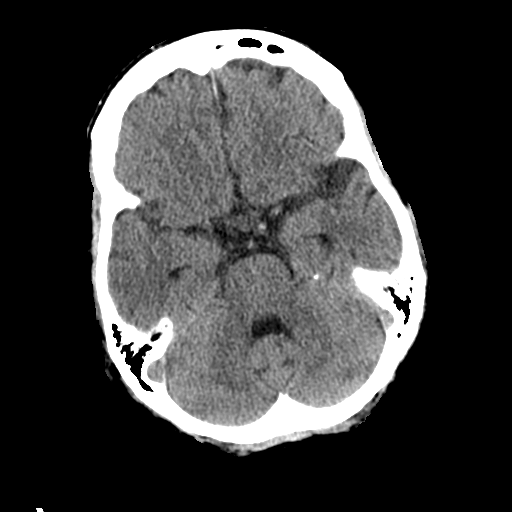
[im 12/32  brain]
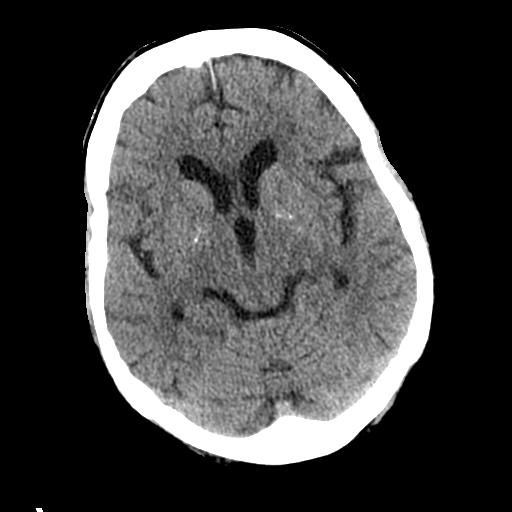
[im 17/32  brain]
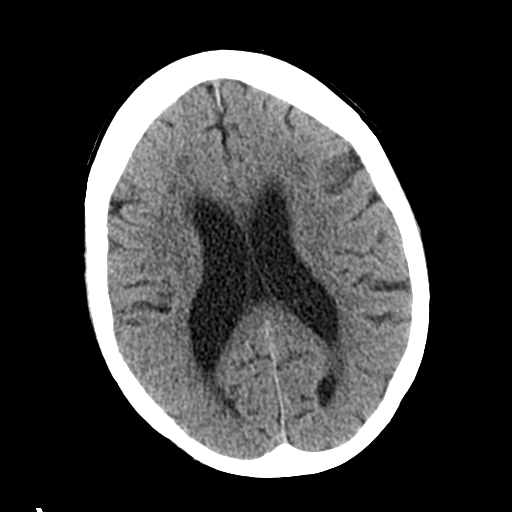
[im 17/32  bone]
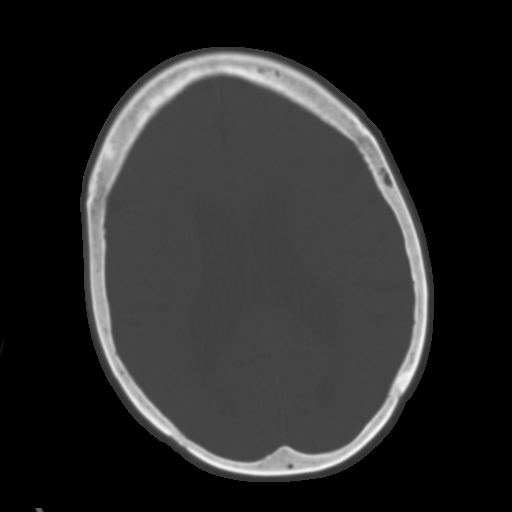
[im 20/32  brain]
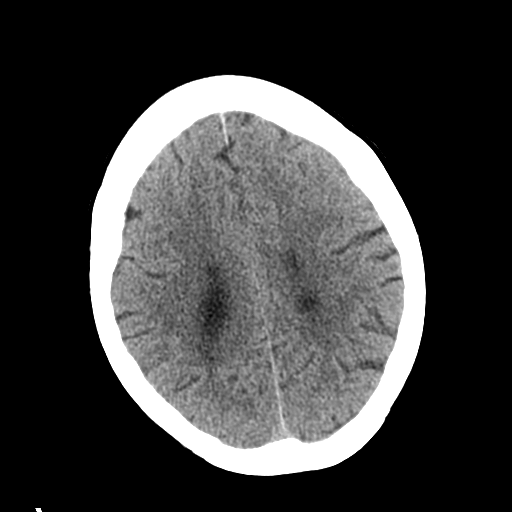
[im 23/32  brain]
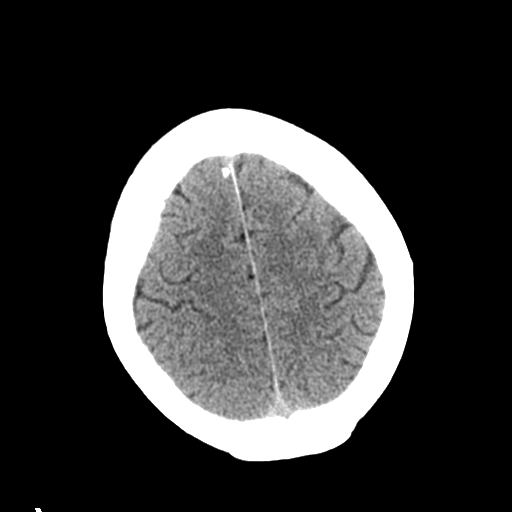
[im 26/32  brain]
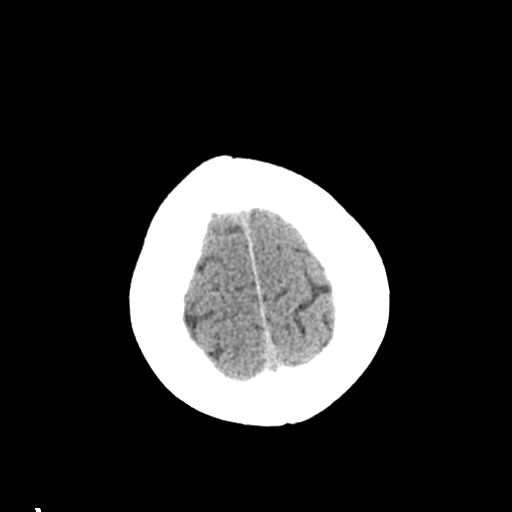
[im 29/32  brain]
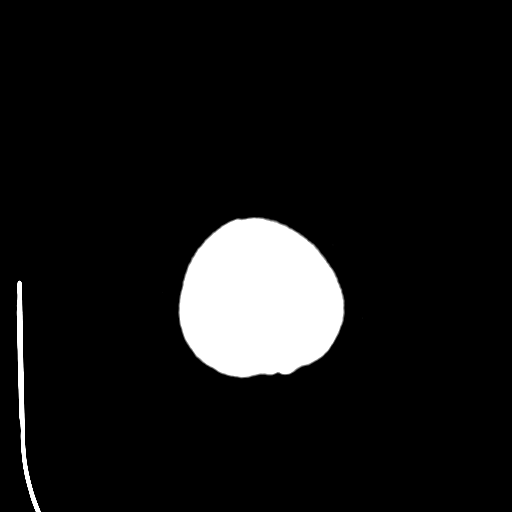
[im 29/32  bone]
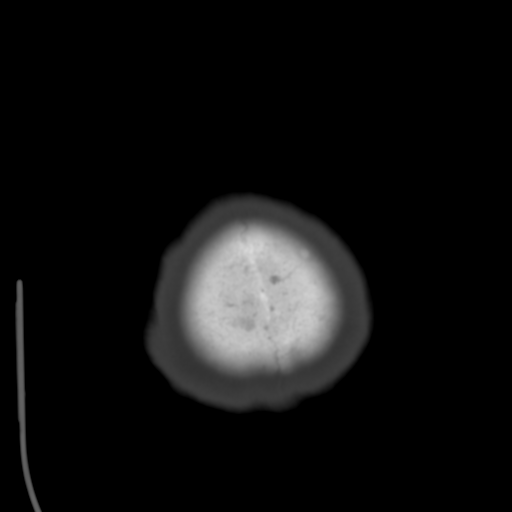

[Series 4: coronal soft · coronal · 0.32mm/px · 3 of 69 slices shown]
[im 23/69  brain]
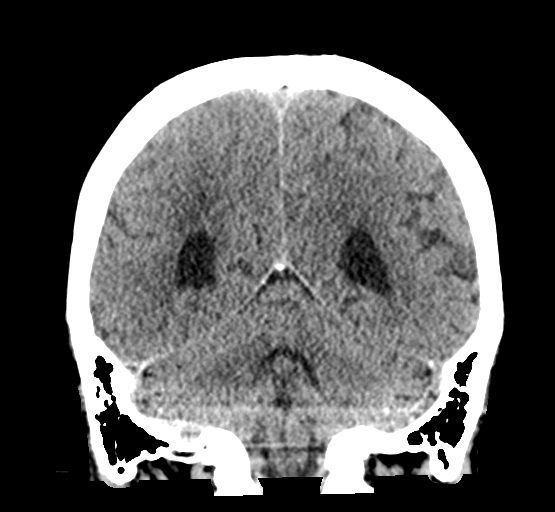
[im 31/69  brain]
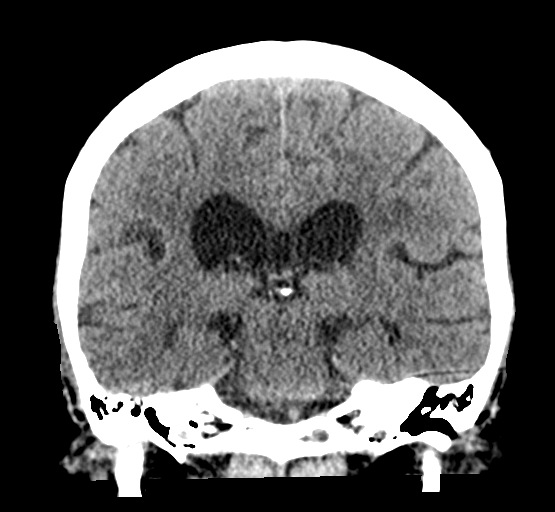
[im 38/69  brain]
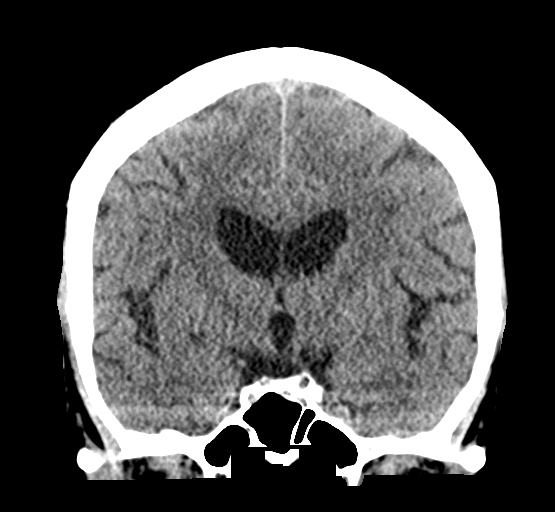

[Series 5: sagittal soft · sagittal · 0.32mm/px · 3 of 55 slices shown]
[im 19/55  brain]
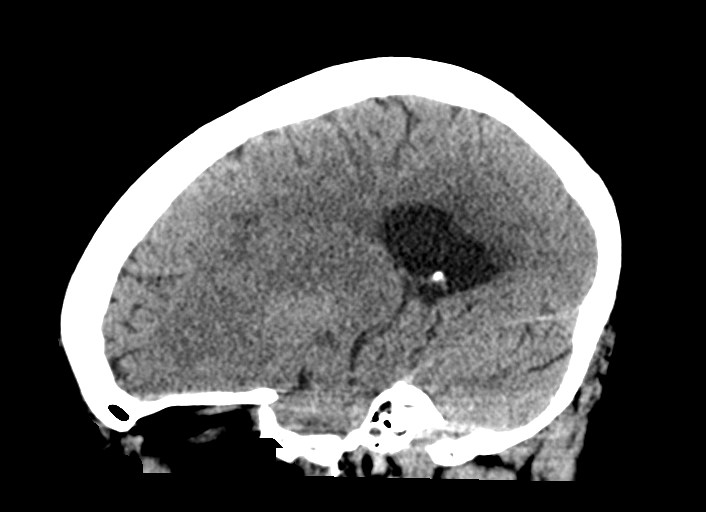
[im 28/55  brain]
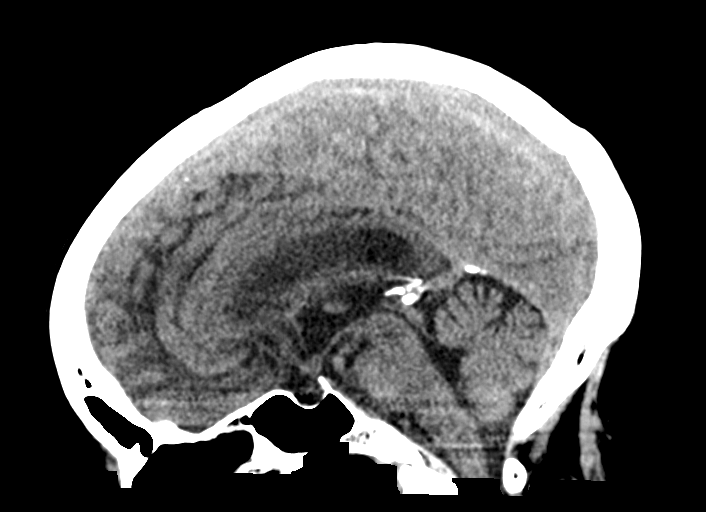
[im 37/55  brain]
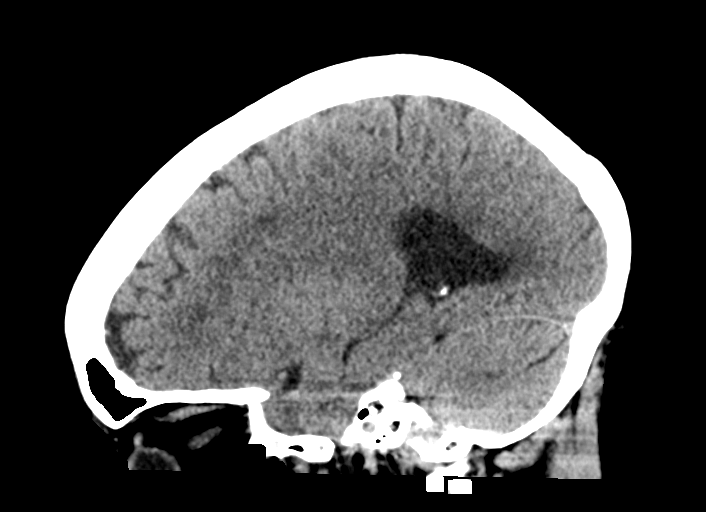

[15 of 47 positions shown; findings below may reference images not displayed]

FINDINGS: Brain: No acute territorial infarction, hemorrhage, or intracranial
mass. Patchy white matter hypodensity consistent with chronic small
vessel ischemic change. Stable ventricle size.

Vascular: No hyperdense vessels.  Carotid vascular calcification.

Skull: No fracture. Heterogenous sclerosis and lucency at the left
skull base and clivus is chronic.

Sinuses/Orbits: No acute finding.

Other: None
IMPRESSION: 1. No CT evidence for acute intracranial abnormality.
2. Mild chronic small vessel ischemic changes of the white matter

## 2021-03-01 MED ORDER — SODIUM CHLORIDE 0.9 % IV SOLN: INTRAVENOUS | Status: DC

## 2021-03-01 MED ORDER — SODIUM CHLORIDE 0.9 % IV BOLUS
500.0000 mL | Freq: Once | INTRAVENOUS | Status: AC
  Administered 2021-03-01: 500 mL via INTRAVENOUS

## 2021-03-01 NOTE — ED Provider Notes (Signed)
[facility] EMERGENCY DEPARTMENT Provider Note   CSN: [id number] Arrival date & time: 03/01/21  1516     History Chief Complaint  Patient presents with   Weakness   Altered Mental Status    Allison Watson is a 80 y.o. female.  HPI She presents for evaluation of weakness for several days.  She states that she fell, yesterday and the day prior and injured her elbows when she fell.  She presents by private vehicle, by a family member for evaluation.  She is a fair historian.  She denies fever or chills.  She is not coughing or having trouble breathing.  She denies headache, chest pain, back pain or abdominal pain.  No other known active modifying factors.    Past Medical History:  Diagnosis Date   Arthritis    Lt arm   Gout    Hypertension     Patient Active Problem List   Diagnosis Date Noted   CKD (chronic kidney disease), stage IV ([place]) 10/62/6948   Toxic metabolic encephalopathy 54/62/7035   Altered mental status 10/11/2020   Obesity (BMI 30-39.9) 10/11/2020   Hypomagnesemia 10/11/2020   Normocytic anemia 10/11/2020   Anemia due to chronic kidney disease 10/11/2020   Psychosis ([place]) 00/93/8182   Acute metabolic encephalopathy 99/37/1696   INSOMNIA, CHRONIC 05/08/2007   BRONCHITIS, ACUTE WITH BRONCHOSPASM 04/16/2007   CELLULITIS/ABSCESS, TRUNK 10/31/2006   MUSCLE SPASM 10/31/2006   Essential hypertension 03/13/2006   ALLERGIC RHINITIS 03/13/2006   Osteoarthrosis, unspecified whether generalized or localized, unspecified site 03/13/2006   ARTHRITIS 03/13/2006    Past Surgical History:  Procedure Laterality Date   ABDOMINAL HYSTERECTOMY     TUBAL LIGATION       OB History   No obstetric history on file.     History reviewed. No pertinent family history.  Social History   Tobacco Use   Smoking status: Former   Smokeless tobacco: Never  Scientific laboratory technician Use: Never used  Substance Use Topics   Alcohol use: No   Drug use: No    Home  Medications Prior to Admission medications   Medication Sig Start Date End Date Taking? Authorizing Provider  cholecalciferol (VITAMIN D3) 25 MCG (1000 UNIT) tablet Take 1,000 Units by mouth daily.   Yes [provider]  clonazePAM (KLONOPIN) 1 MG tablet Take 1 mg by mouth 2 (two) times daily as needed. 02/14/21  Yes [provider]  doxycycline (VIBRAMYCIN) 100 MG capsule Take 100 mg by mouth 2 (two) times daily. 02/14/21  Yes [provider]  labetalol (NORMODYNE) 100 MG tablet Take 1 tablet (100 mg total) by mouth 2 (two) times daily. 10/09/20  Yes Emokpae, Courage, MD  OLANZapine (ZYPREXA) 10 MG tablet Take 1 tablet (10 mg total) by mouth at bedtime. 10/09/20  Yes Emokpae, Courage, MD  topiramate (TOPAMAX) 100 MG tablet Take 1 tablet (100 mg total) by mouth 2 (two) times daily. 10/09/20  Yes Emokpae, Courage, MD  albuterol (VENTOLIN HFA) 108 (90 Base) MCG/ACT inhaler Inhale 1-2 puffs into the lungs every 6 (six) hours as needed for wheezing or shortness of breath. Will use when too hot/ too cold outside    [provider]  diltiazem (CARDIZEM CD) 240 MG 24 hr capsule Take 240 mg by mouth daily. 07/20/20   [provider]  hydrALAZINE (APRESOLINE) 100 MG tablet Take 1 tablet (100 mg total) by mouth 2 (two) times daily. 10/09/20   Roxan Hockey, MD  lisinopril-hydrochlorothiazide (ZESTORETIC) 20-12.5 MG  tablet Take 1 tablet by mouth daily.    [provider]  naproxen (NAPROSYN) 500 MG tablet Take 500 mg by mouth daily as needed (pain in arm).    [provider]    Allergies    Codeine, Lorazepam, and Penicillins  Review of Systems   Review of Systems  All other systems reviewed and are negative.  Physical Exam Updated Vital Signs BP (!) 165/72   Pulse 66   Temp 98.6 F (37 C) (Rectal)   Resp 16   SpO2 95%   Physical Exam Vitals and nursing note reviewed.  Constitutional:      General: She is not in acute distress.     Appearance: She is well-developed. She is not ill-appearing, toxic-appearing or diaphoretic.     Comments: Elderly, frail  HENT:     Head: Normocephalic and atraumatic.     Right Ear: External ear normal.     Left Ear: External ear normal.  Eyes:     Conjunctiva/sclera: Conjunctivae normal.     Pupils: Pupils are equal, round, and reactive to light.  Neck:     Trachea: Phonation normal.  Cardiovascular:     Rate and Rhythm: Normal rate and regular rhythm.     Heart sounds: Normal heart sounds.  Pulmonary:     Effort: Pulmonary effort is normal. No respiratory distress.     Breath sounds: Normal breath sounds. No stridor.  Abdominal:     Palpations: Abdomen is soft.     Tenderness: There is no abdominal tenderness.  Musculoskeletal:        General: Normal range of motion.     Cervical back: Normal range of motion and neck supple.     Comments: No large joint deformity.  Normal symmetric strength bilaterally  Skin:    General: Skin is warm and dry.     Comments: Very minimal abrasions, both elbows, not bleeding or swelling  Neurological:     Mental Status: She is alert and oriented to person, place, and time.     Cranial Nerves: No cranial nerve deficit.     Sensory: No sensory deficit.     Motor: No abnormal muscle tone.     Coordination: Coordination normal.  Psychiatric:        Mood and Affect: Mood normal.        Behavior: Behavior normal.        Thought Content: Thought content normal.        Judgment: Judgment normal.    ED Results / Procedures / Treatments   Labs (all labs ordered are listed, but only abnormal results are displayed) Labs Reviewed  BASIC METABOLIC PANEL - Abnormal; Notable for the following components:      Result Value   Creatinine, Ser 1.42 (*)    GFR, Estimated 38 (*)    All other components within normal limits  CBC WITH DIFFERENTIAL/PLATELET - Abnormal; Notable for the following components:   RDW 17.5 (*)    Platelets 149 (*)    All other  components within normal limits  URINALYSIS, ROUTINE W REFLEX MICROSCOPIC - Abnormal; Notable for the following components:   Ketones, ur 5 (*)    All other components within normal limits  CULTURE, BLOOD (ROUTINE X 2)  RESP PANEL BY RT-PCR (FLU A&B, COVID) ARPGX2  CULTURE, BLOOD (ROUTINE X 2) W REFLEX TO ID PANEL  LACTIC ACID, PLASMA  LACTIC ACID, PLASMA    EKG EKG Interpretation  Date/Time:  Tuesday March 01 2021 16:20:37 EST Ventricular Rate:  69 PR Interval:  175 QRS Duration: 83 QT Interval:  389 QTC Calculation: 417 R Axis:   -25 Text Interpretation: Sinus rhythm Borderline left axis deviation Low voltage, precordial leads Consider anterior infarct since last tracing no significant change Confirmed by Daleen Bo [phone]) on 03/01/2021 4:56:56 PM  Radiology No results found.  Procedures Procedures   Medications Ordered in ED Medications  0.9 %  sodium chloride infusion ( Intravenous New Bag/Given 03/01/21 1835)  sodium chloride 0.9 % bolus 500 mL (0 mLs Intravenous Stopped 03/01/21 1830)    ED Course  I have reviewed the triage vital signs and the nursing notes.  Pertinent labs & imaging results that were available during my care of the patient were reviewed by me and considered in my medical decision making (see chart for details).  Clinical Course as of 03/01/21 2250  Tue Mar 01, 2021  1922 Hemoglobin: 12.6 [EW]  2030 Patient was able to ambulate on her own with supervision. [EW]  2119 Patient's daughter has now arrived and gives additional history.  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  She has had periods of confusion, tremulousness, making unusual facial movements, and more more unable to take care of herself.  She apparently had some blood work done about 2 weeks ago that showed "kidney failure", and was told by her PCP at that time that she might need dialysis as soon as possible.   Today she went back to the doctor's office and they felt she was worse so they sent her here for further evaluation.  Daughter feels like the patient has having problems like when she was admitted and treated for encephalopathy, several months ago. [EW]    Clinical Course User Index [EW] Daleen Bo, MD   MDM Rules/Calculators/A&P                            Patient Vitals for the past 24 hrs:  BP Temp Temp src Pulse Resp SpO2  03/01/21 2100 (!) 165/72 -- -- 66 16 95 %  03/01/21 2030 (!) 165/71 -- -- 64 17 95 %  03/01/21 2000 (!) 162/73 -- -- -- 16 --  03/01/21 1945 -- -- -- 65 11 95 %  03/01/21 1930 128/60 -- -- 65 (!) 23 95 %  03/01/21 1900 (!) 166/64 -- -- 66 (!) 24 97 %  03/01/21 1845 -- -- -- 64 18 97 %  03/01/21 1830 (!) 208/99 -- -- 73 18 99 %  03/01/21 1800 (!) 174/117 -- -- -- (!) 23 --  03/01/21 1730 (!) 177/105 -- -- 65 (!) 21 99 %  03/01/21 1715 -- -- -- 66 (!) 21 99 %  03/01/21 1700 (!) 189/76 -- -- 65 (!) 24 98 %  03/01/21 1630 (!) 168/76 -- -- 69 (!) 24 99 %  03/01/21 1600 (!) 186/82 98.6 F (37 C) Rectal 68 (!) 22 95 %  03/01/21 1541 (!) 183/87 98.3 F (36.8 C) -- 71 18 96 %  03/01/21 1537 (!) 183/87 98.7 F (37.1 C) Oral 71 14 98 %    10:49 PM Reevaluation with update and discussion. After initial assessment and treatment, an updated evaluation reveals she is resting comfortably.  Daughter in the room.  Findings discussed with daughter and all questions were answered. Daleen Bo   Medical Decision Making:  This patient is presenting for evaluation of weakness, and, which does require a range of treatment options, and is a complaint that involves a moderate risk of morbidity and mortality. The differential diagnoses include injuries from fall, acute illnesses including infectious processes and metabolic disorders. I decided to review old records, and in summary elderly female, coming from home, no recent hospitalizations.  She has history of chronic kidney  disease, osteoarthritis, and has been previously evaluated for metabolic encephalopathy.  I obtained additional historical information from daughter at bedside.  Clinical Laboratory Tests Ordered, included CBC, Metabolic panel, Urinalysis, and lactate, blood cultures, viral panel . Review indicates normal except creatinine elevated, GFR low. Radiologic Tests Ordered, included CT head.  I independently Visualized: Radiograph images, which show no acute abnormalities    Critical Interventions-clinical evaluation, laboratory testing, observation and reassessment  After These Interventions, the Patient was reevaluated and was found to be relatively stable.  She appears to have ongoing mild confusion, likely unspecified encephalopathy.  She did not follow-up with neurology following recent hospitalization where she was evaluated and treated for same.  Today there is no evidence for acute CNS abnormality, acute infectious process or significant metabolic instability.  She may have an element of dementia as well.  She is stable for discharge with outpatient management.  She will be referred back to neurology for further evaluation and treatment ASAP, as an outpatient.  Patient will be unable to work until she is evaluated by neurology.  CRITICAL CARE-no Performed by: Daleen Bo  Nursing Notes Reviewed/ Care Coordinated Applicable Imaging Reviewed Interpretation of Laboratory Data incorporated into ED treatment  The patient appears reasonably screened and/or stabilized for discharge and I doubt any other medical condition or other [facility] requiring further screening, evaluation, or treatment in the ED at this time prior to discharge.  Plan: Home Medications-continue usual; Home Treatments-rest, fluids; return here if the recommended treatment, does not improve the symptoms; Recommended follow up-PCP,.     Final Clinical Impression(s) / ED Diagnoses Final diagnoses:  Confusion  Weakness    Rx  / DC Orders ED Discharge Orders     None        Daleen Bo, MD 03/03/21 1640

## 2021-03-01 NOTE — ED Notes (Signed)
Pt daughter given d/c paperwork, verbalized understanding. D/c home with daughter via wheelchair to car.

## 2021-03-01 NOTE — ED Triage Notes (Signed)
Patient referred from PCP for evaluation and treatment. Weak for the past 2 days, altered mental status.

## 2021-03-01 NOTE — ED Notes (Signed)
Staff ambulated pt in room ~ 20 ft. Pt is weak, gait slow and mildly unsteady. Pt somnolent, occasionally making eye contact with minimal verbalization. Pt reports feeling weak, denies dizziness. Pt back to bed, difficulty getting back to bed by self with moderate assist from staff x 2. Pt back to bed and closes eyes immediately, does not attempt to position self in bed from where staff placed her after ambulating. Pt denies discomfort or needs at this time. Reported to Dr. Eulis Foster.

## 2021-03-01 NOTE — Discharge Instructions (Signed)
The testing today shows that she has mild renal insufficiency.  Her kidney function is actually better than it has been previously this is not indicative of need for dialysis.  Her confusion and weakness is nonspecific.  It is important for her to follow-up with Dr. Merlene Laughter because of the previously discovered right side brain problem.  He will need to evaluate her further and consider other treatments, that can help her.  In the meantime make sure she is eating and drinking normally, and support her with help, walking and keeping her safe.  Return here, if needed for problems.

## 2021-03-06 LAB — CULTURE, BLOOD (ROUTINE X 2)
Culture: NO GROWTH
Culture: NO GROWTH
Special Requests: ADEQUATE
Special Requests: ADEQUATE

## 2021-03-29 ENCOUNTER — Emergency Department (HOSPITAL_COMMUNITY): Payer: Medicare HMO

## 2021-03-29 ENCOUNTER — Observation Stay (HOSPITAL_COMMUNITY)
Admit: 2021-03-29 | Discharge: 2021-03-29 | Disposition: A | Discharge status: Home / Self Care | Payer: Medicare HMO | Attending: Internal Medicine | Admitting: Internal Medicine

## 2021-03-29 ENCOUNTER — Encounter (HOSPITAL_COMMUNITY): Payer: Self-pay | Admitting: Emergency Medicine

## 2021-03-29 ENCOUNTER — Observation Stay (HOSPITAL_BASED_OUTPATIENT_CLINIC_OR_DEPARTMENT_OTHER): Payer: Medicare HMO

## 2021-03-29 ENCOUNTER — Observation Stay (HOSPITAL_COMMUNITY)
Admission: EM | Admit: 2021-03-29 | Discharge: 2021-03-31 | Disposition: A | Discharge status: Home / Self Care | Payer: Medicare HMO | Attending: Family Medicine | Admitting: Family Medicine

## 2021-03-29 ENCOUNTER — Other Ambulatory Visit: Payer: Self-pay

## 2021-03-29 DIAGNOSIS — I10 Essential (primary) hypertension: Secondary | ICD-10-CM | POA: Diagnosis not present

## 2021-03-29 DIAGNOSIS — E876 Hypokalemia: Secondary | ICD-10-CM | POA: Insufficient documentation

## 2021-03-29 DIAGNOSIS — Z87891 Personal history of nicotine dependence: Secondary | ICD-10-CM | POA: Insufficient documentation

## 2021-03-29 DIAGNOSIS — R404 Transient alteration of awareness: Secondary | ICD-10-CM

## 2021-03-29 DIAGNOSIS — R29818 Other symptoms and signs involving the nervous system: Secondary | ICD-10-CM

## 2021-03-29 DIAGNOSIS — E669 Obesity, unspecified: Secondary | ICD-10-CM | POA: Diagnosis not present

## 2021-03-29 DIAGNOSIS — N184 Chronic kidney disease, stage 4 (severe): Secondary | ICD-10-CM | POA: Diagnosis not present

## 2021-03-29 DIAGNOSIS — R4182 Altered mental status, unspecified: Secondary | ICD-10-CM | POA: Diagnosis present

## 2021-03-29 DIAGNOSIS — Y900 Blood alcohol level of less than 20 mg/100 ml: Secondary | ICD-10-CM | POA: Diagnosis not present

## 2021-03-29 DIAGNOSIS — Z20822 Contact with and (suspected) exposure to covid-19: Secondary | ICD-10-CM | POA: Diagnosis not present

## 2021-03-29 DIAGNOSIS — Z79899 Other long term (current) drug therapy: Secondary | ICD-10-CM | POA: Insufficient documentation

## 2021-03-29 DIAGNOSIS — I129 Hypertensive chronic kidney disease with stage 1 through stage 4 chronic kidney disease, or unspecified chronic kidney disease: Secondary | ICD-10-CM | POA: Diagnosis not present

## 2021-03-29 DIAGNOSIS — G9341 Metabolic encephalopathy: Secondary | ICD-10-CM | POA: Diagnosis not present

## 2021-03-29 DIAGNOSIS — R55 Syncope and collapse: Secondary | ICD-10-CM

## 2021-03-29 LAB — COMPREHENSIVE METABOLIC PANEL
ALT: 13 U/L (ref 0–44)
AST: 20 U/L (ref 15–41)
Albumin: 3.4 g/dL — ABNORMAL LOW (ref 3.5–5.0)
Alkaline Phosphatase: 57 U/L (ref 38–126)
Anion gap: 8 (ref 5–15)
BUN: 11 mg/dL (ref 8–23)
CO2: 25 mmol/L (ref 22–32)
Calcium: 9.3 mg/dL (ref 8.9–10.3)
Chloride: 109 mmol/L (ref 98–111)
Creatinine, Ser: 1.28 mg/dL — ABNORMAL HIGH (ref 0.44–1.00)
GFR, Estimated: 43 mL/min — ABNORMAL LOW (ref >60)
Glucose, Bld: 136 mg/dL — ABNORMAL HIGH (ref 70–99)
Potassium: 3.2 mmol/L — ABNORMAL LOW (ref 3.5–5.1)
Sodium: 142 mmol/L (ref 135–145)
Total Bilirubin: 0.3 mg/dL (ref 0.3–1.2)
Total Protein: 6 g/dL — ABNORMAL LOW (ref 6.5–8.1)

## 2021-03-29 LAB — URINALYSIS, ROUTINE W REFLEX MICROSCOPIC
Bilirubin Urine: NEGATIVE
Glucose, UA: NEGATIVE mg/dL
Hgb urine dipstick: NEGATIVE
Ketones, ur: NEGATIVE mg/dL
Leukocytes,Ua: NEGATIVE
Nitrite: NEGATIVE
Protein, ur: NEGATIVE mg/dL
Specific Gravity, Urine: 1.015 (ref 1.005–1.030)
pH: 8.5 — ABNORMAL HIGH (ref 5.0–8.0)

## 2021-03-29 LAB — ECHOCARDIOGRAM COMPLETE
AR max vel: 1.75 cm2
AV Area VTI: 2.05 cm2
AV Area mean vel: 1.76 cm2
AV Mean grad: 5.8 mmHg
AV Peak grad: 10.7 mmHg
Ao pk vel: 1.64 m/s
Area-P 1/2: 3.6 cm2
Height: 60 in
S' Lateral: 2.4 cm
Weight: 2800 oz

## 2021-03-29 LAB — I-STAT CHEM 8, ED
BUN: 9 mg/dL (ref 8–23)
Calcium, Ion: 1.23 mmol/L (ref 1.15–1.40)
Chloride: 107 mmol/L (ref 98–111)
Creatinine, Ser: 1.4 mg/dL — ABNORMAL HIGH (ref 0.44–1.00)
Glucose, Bld: 131 mg/dL — ABNORMAL HIGH (ref 70–99)
HCT: 38 % (ref 36.0–46.0)
Hemoglobin: 12.9 g/dL (ref 12.0–15.0)
Potassium: 3.4 mmol/L — ABNORMAL LOW (ref 3.5–5.1)
Sodium: 144 mmol/L (ref 135–145)
TCO2: 25 mmol/L (ref 22–32)

## 2021-03-29 LAB — RAPID URINE DRUG SCREEN, HOSP PERFORMED
Amphetamines: NOT DETECTED
Barbiturates: NOT DETECTED
Benzodiazepines: NOT DETECTED
Cocaine: NOT DETECTED
Opiates: NOT DETECTED
Tetrahydrocannabinol: NOT DETECTED

## 2021-03-29 LAB — CBC WITH DIFFERENTIAL/PLATELET
Abs Immature Granulocytes: 0 10*3/uL (ref 0.00–0.07)
Basophils Absolute: 0 10*3/uL (ref 0.0–0.1)
Basophils Relative: 1 %
Eosinophils Absolute: 0.2 10*3/uL (ref 0.0–0.5)
Eosinophils Relative: 6 %
HCT: 37 % (ref 36.0–46.0)
Hemoglobin: 12 g/dL (ref 12.0–15.0)
Immature Granulocytes: 0 %
Lymphocytes Relative: 43 %
Lymphs Abs: 1.7 10*3/uL (ref 0.7–4.0)
MCH: 26.8 pg (ref 26.0–34.0)
MCHC: 32.4 g/dL (ref 30.0–36.0)
MCV: 82.6 fL (ref 80.0–100.0)
Monocytes Absolute: 0.3 10*3/uL (ref 0.1–1.0)
Monocytes Relative: 8 %
Neutro Abs: 1.6 10*3/uL — ABNORMAL LOW (ref 1.7–7.7)
Neutrophils Relative %: 42 %
Platelets: 156 10*3/uL (ref 150–400)
RBC: 4.48 MIL/uL (ref 3.87–5.11)
RDW: 18 % — ABNORMAL HIGH (ref 11.5–15.5)
WBC: 3.9 10*3/uL — ABNORMAL LOW (ref 4.0–10.5)
nRBC: 0 % (ref 0.0–0.2)

## 2021-03-29 LAB — CBG MONITORING, ED: Glucose-Capillary: 133 mg/dL — ABNORMAL HIGH (ref 70–99)

## 2021-03-29 LAB — T4, FREE: Free T4: 1.03 ng/dL (ref 0.61–1.12)

## 2021-03-29 LAB — TSH: TSH: 3.079 u[IU]/mL (ref 0.350–4.500)

## 2021-03-29 LAB — FOLATE: Folate: 4 ng/mL — ABNORMAL LOW (ref >5.9)

## 2021-03-29 LAB — RESP PANEL BY RT-PCR (FLU A&B, COVID) ARPGX2
Influenza A by PCR: NEGATIVE
Influenza B by PCR: NEGATIVE
SARS Coronavirus 2 by RT PCR: NEGATIVE

## 2021-03-29 LAB — MAGNESIUM: Magnesium: 1.6 mg/dL — ABNORMAL LOW (ref 1.7–2.4)

## 2021-03-29 LAB — ETHANOL: Alcohol, Ethyl (B): <10 mg/dL (ref <10)

## 2021-03-29 LAB — AMMONIA: Ammonia: 17 umol/L (ref 9–35)

## 2021-03-29 LAB — VITAMIN B12: Vitamin B-12: 749 pg/mL (ref 180–914)

## 2021-03-29 IMAGING — DX DG CHEST 1V PORT
1 series · 1 of 1 positions shown · non-contrast
Comparison: [DATE]

CLINICAL DATA: Altered mental status

EXAM:
PORTABLE CHEST 1 VIEW

[chest ap]
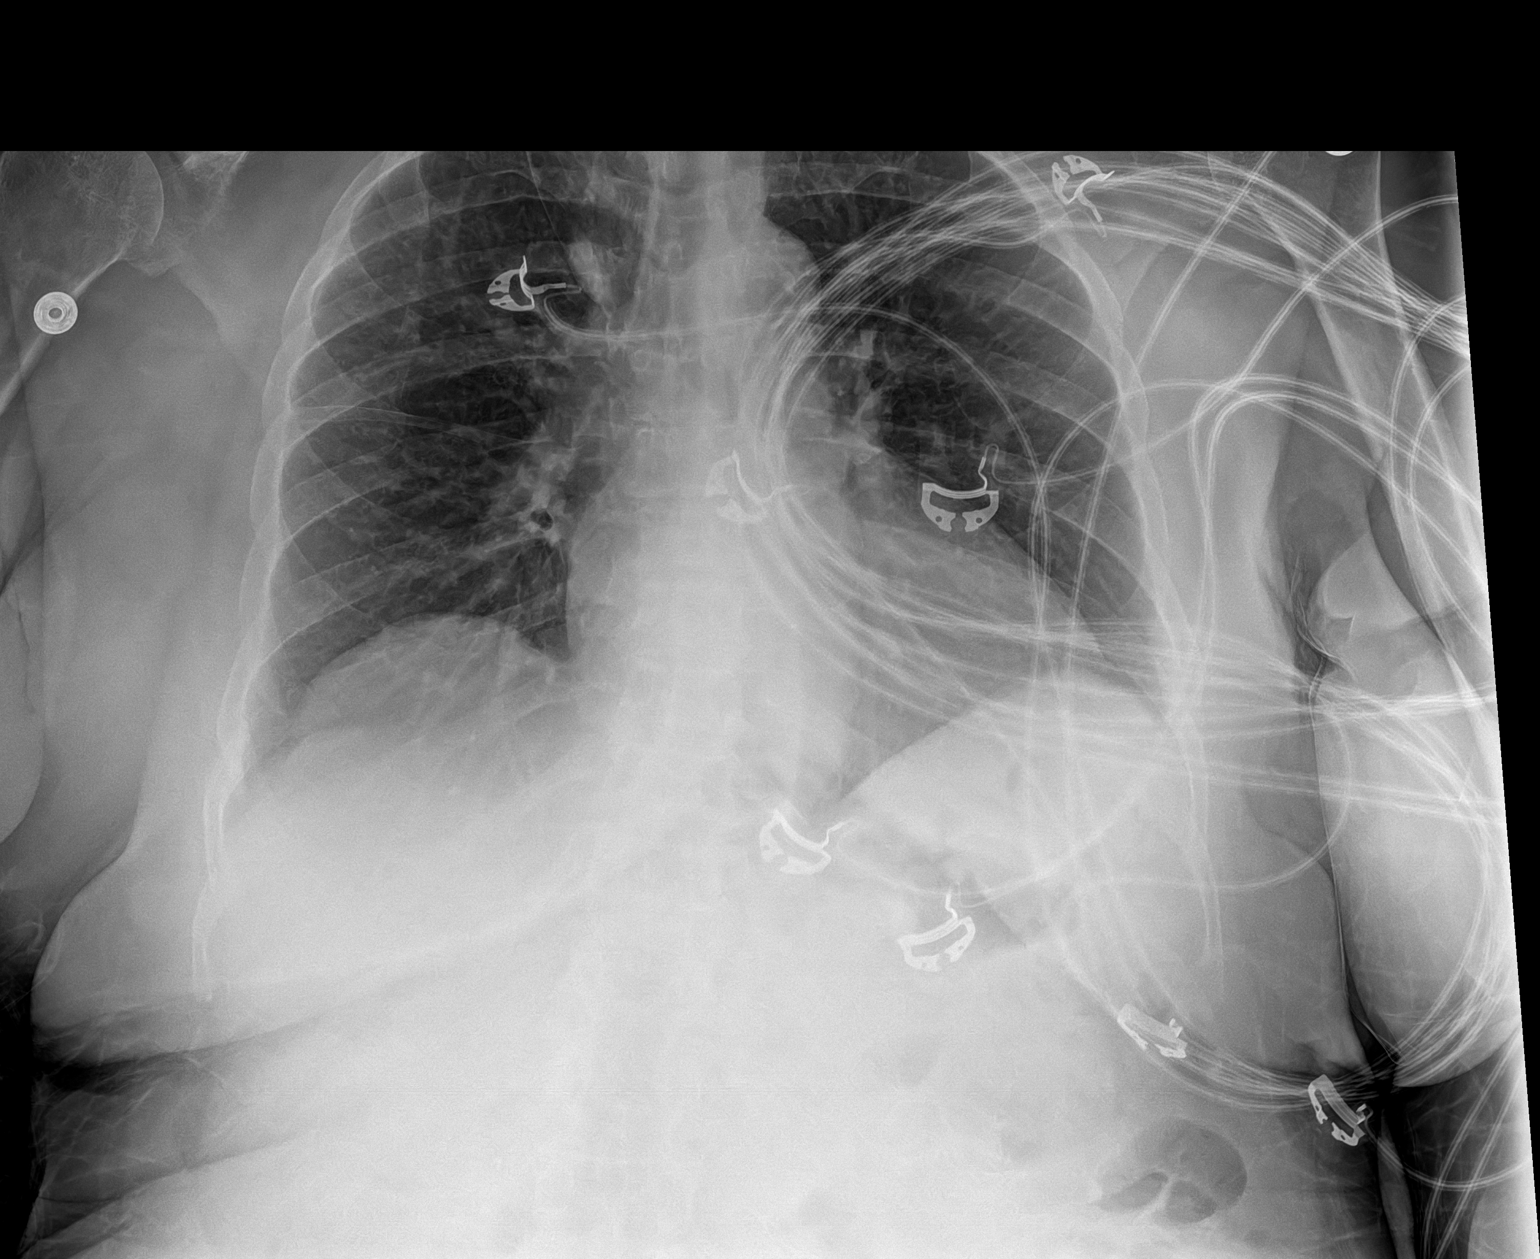

[1 of 1 positions shown; findings below may reference images not displayed]

FINDINGS: Heart and mediastinal contours are within normal limits. No focal
opacities or effusions. No acute bony abnormality.
IMPRESSION: No active disease.

## 2021-03-29 MED ORDER — LORAZEPAM 2 MG/ML IJ SOLN
1.0000 mg | Freq: Once | INTRAMUSCULAR | Status: DC
  Filled 2021-03-29: qty 1

## 2021-03-29 MED ORDER — LABETALOL HCL 200 MG PO TABS
100.0000 mg | ORAL_TABLET | Freq: Two times a day (BID) | ORAL | Status: DC
  Administered 2021-03-29 – 2021-03-31 (×5): 100 mg via ORAL
  Filled 2021-03-29 (×5): qty 1

## 2021-03-29 MED ORDER — OLANZAPINE 5 MG PO TABS
10.0000 mg | ORAL_TABLET | Freq: Every day | ORAL | Status: DC
  Administered 2021-03-29 – 2021-03-30 (×2): 10 mg via ORAL
  Filled 2021-03-29 (×2): qty 2

## 2021-03-29 MED ORDER — ACETAMINOPHEN 325 MG PO TABS
650.0000 mg | ORAL_TABLET | Freq: Four times a day (QID) | ORAL | Status: DC | PRN
  Administered 2021-03-29: 650 mg via ORAL
  Filled 2021-03-29: qty 2

## 2021-03-29 MED ORDER — MAGNESIUM SULFATE 2 GM/50ML IV SOLN
2.0000 g | Freq: Once | INTRAVENOUS | Status: AC
  Administered 2021-03-29: 2 g via INTRAVENOUS
  Filled 2021-03-29: qty 50

## 2021-03-29 MED ORDER — POTASSIUM CHLORIDE CRYS ER 20 MEQ PO TBCR
40.0000 meq | EXTENDED_RELEASE_TABLET | Freq: Once | ORAL | Status: AC
  Administered 2021-03-29: 40 meq via ORAL
  Filled 2021-03-29: qty 2

## 2021-03-29 MED ORDER — ENOXAPARIN SODIUM 40 MG/0.4ML IJ SOSY
40.0000 mg | PREFILLED_SYRINGE | INTRAMUSCULAR | Status: DC
  Administered 2021-03-29 – 2021-03-30 (×2): 40 mg via SUBCUTANEOUS
  Filled 2021-03-29 (×2): qty 0.4

## 2021-03-29 MED ORDER — CLONAZEPAM 0.5 MG PO TABS
1.0000 mg | ORAL_TABLET | Freq: Two times a day (BID) | ORAL | Status: DC | PRN
  Administered 2021-03-30: 1 mg via ORAL
  Filled 2021-03-29: qty 2

## 2021-03-29 MED ORDER — PROCHLORPERAZINE EDISYLATE 10 MG/2ML IJ SOLN: 5.0000 mg | Freq: Four times a day (QID) | INTRAMUSCULAR | Status: DC | PRN

## 2021-03-29 MED ORDER — ACETAMINOPHEN 650 MG RE SUPP: 650.0000 mg | Freq: Four times a day (QID) | RECTAL | Status: DC | PRN

## 2021-03-29 MED ORDER — TOPIRAMATE 100 MG PO TABS
100.0000 mg | ORAL_TABLET | Freq: Two times a day (BID) | ORAL | Status: DC
  Administered 2021-03-29 – 2021-03-30 (×3): 100 mg via ORAL
  Filled 2021-03-29 (×3): qty 1

## 2021-03-29 MED ORDER — SODIUM CHLORIDE 0.9 % IV BOLUS
1000.0000 mL | Freq: Once | INTRAVENOUS | Status: AC
  Administered 2021-03-29: 1000 mL via INTRAVENOUS

## 2021-03-29 NOTE — ED Provider Notes (Signed)
[facility] EMERGENCY DEPARTMENT Provider Note   CSN: [id number] Arrival date & time: 03/29/21  0915  LEVEL 5 CAVEAT - ALTERED MENTAL STATUS   History Chief Complaint  Patient presents with   unresponsive    Allison Watson is a 80 y.o. female.  HPI 80 year old female presents with altered mental status.  History is primarily from the daughter at the bedside.  She seemed to be normal this morning.  They were going to the bank so she could get some money to pay for rent.  However when she was at the bank she was having trouble finding the right card to use for the ATM.  She was seeming to be confused and just kept saying "just give me a minute".  Daughter eventually had to take over and that at some point the patient leaned over and was unresponsive.  Daughter brought her here.  She has been having twitching that seems to vary in where it is located for several weeks.  It has been more prominent since Thanksgiving.  However this is pretty much a constant finding for her.  No recent illness or complaints otherwise.  Past Medical History:  Diagnosis Date   Arthritis    Lt arm   Gout    Hypertension     Patient Active Problem List   Diagnosis Date Noted   CKD (chronic kidney disease), stage IV ([place]) 56/21/3086   Toxic metabolic encephalopathy 57/84/6962   Altered mental status 10/11/2020   Obesity (BMI 30-39.9) 10/11/2020   Hypomagnesemia 10/11/2020   Normocytic anemia 10/11/2020   Anemia due to chronic kidney disease 10/11/2020   Psychosis ([place]) 95/28/4132   Acute metabolic encephalopathy 44/04/270   INSOMNIA, CHRONIC 05/08/2007   BRONCHITIS, ACUTE WITH BRONCHOSPASM 04/16/2007   CELLULITIS/ABSCESS, TRUNK 10/31/2006   MUSCLE SPASM 10/31/2006   Essential hypertension 03/13/2006   ALLERGIC RHINITIS 03/13/2006   Osteoarthrosis, unspecified whether generalized or localized, unspecified site 03/13/2006   ARTHRITIS 03/13/2006    Past Surgical History:  Procedure Laterality  Date   ABDOMINAL HYSTERECTOMY     TUBAL LIGATION       OB History   No obstetric history on file.     History reviewed. No pertinent family history.  Social History   Tobacco Use   Smoking status: Former   Smokeless tobacco: Never  Scientific laboratory technician Use: Never used  Substance Use Topics   Alcohol use: No   Drug use: No    Home Medications Prior to Admission medications   Medication Sig Start Date End Date Taking? Authorizing Provider  albuterol (VENTOLIN HFA) 108 (90 Base) MCG/ACT inhaler Inhale 1-2 puffs into the lungs every 6 (six) hours as needed for wheezing or shortness of breath. Will use when too hot/ too cold outside    [provider]  cholecalciferol (VITAMIN D3) 25 MCG (1000 UNIT) tablet Take 1,000 Units by mouth daily.    [provider]  clonazePAM (KLONOPIN) 1 MG tablet Take 1 mg by mouth 2 (two) times daily as needed. 02/14/21   [provider]  cyanocobalamin (,VITAMIN B-12,) 1000 MCG/ML injection Inject 1,000 mcg into the skin every 30 (thirty) days. 12/09/20   [provider]  diltiazem (CARDIZEM CD) 240 MG 24 hr capsule Take 240 mg by mouth daily. 07/20/20   [provider]  doxycycline (VIBRAMYCIN) 100 MG capsule Take 100 mg by mouth 2 (two) times daily. 02/14/21   [provider]  hydrALAZINE (APRESOLINE) 100 MG tablet Take 1  tablet (100 mg total) by mouth 2 (two) times daily. 10/09/20   Roxan Hockey, MD  labetalol (NORMODYNE) 100 MG tablet Take 1 tablet (100 mg total) by mouth 2 (two) times daily. 10/09/20   Roxan Hockey, MD  lisinopril-hydrochlorothiazide (ZESTORETIC) 20-12.5 MG tablet Take 1 tablet by mouth daily.    [provider]  LORazepam (ATIVAN) 1 MG tablet Take 1 mg by mouth daily as needed. 10/12/20   [provider]  naproxen (NAPROSYN) 500 MG tablet Take 500 mg by mouth daily as needed (pain in arm).    [provider]  OLANZapine (ZYPREXA) 10 MG tablet Take 1  tablet (10 mg total) by mouth at bedtime. 10/09/20   Roxan Hockey, MD  oxyCODONE (OXY IR/ROXICODONE) 5 MG immediate release tablet Take 5 mg by mouth 2 (two) times daily as needed. 03/22/21   [provider]  topiramate (TOPAMAX) 100 MG tablet Take 1 tablet (100 mg total) by mouth 2 (two) times daily. 10/09/20   Roxan Hockey, MD    Allergies    Codeine, Lorazepam, and Penicillins  Review of Systems   Review of Systems  Unable to perform ROS: Mental status change   Physical Exam Updated Vital Signs BP (!) 198/70 (BP Location: Right Arm)   Pulse 61   Temp 98.1 F (36.7 C) (Oral)   Resp 18   Ht 5' (1.524 m)   Wt 79.4 kg   SpO2 100%   BMI 34.18 kg/m   Physical Exam Vitals and nursing note reviewed.  Constitutional:      Appearance: She is well-developed. She is not diaphoretic.  HENT:     Head: Normocephalic and atraumatic.     Right Ear: External ear normal.     Left Ear: External ear normal.     Nose: Nose normal.  Eyes:     General:        Right eye: No discharge.        Left eye: No discharge.     Extraocular Movements: Extraocular movements intact.     Pupils: Pupils are equal, round, and reactive to light.  Cardiovascular:     Rate and Rhythm: Normal rate and regular rhythm.     Heart sounds: Normal heart sounds.  Pulmonary:     Effort: Pulmonary effort is normal.     Breath sounds: Normal breath sounds.  Abdominal:     Palpations: Abdomen is soft.     Tenderness: There is no abdominal tenderness.  Musculoskeletal:     Cervical back: Normal range of motion and neck supple. No rigidity.  Skin:    General: Skin is warm and dry.  Neurological:     Comments: Patient has eyes closed but awakens to light stimulation. Is able to tell me where she is and is oriented. Moves all 4 extremities equally. There is some fine twitching, at this time right sided including right mouth, tongue, right eye and fingers in right hand  Psychiatric:        Mood and  Affect: Mood is not anxious.    ED Results / Procedures / Treatments   Labs (all labs ordered are listed, but only abnormal results are displayed) Labs Reviewed  COMPREHENSIVE METABOLIC PANEL - Abnormal; Notable for the following components:      Result Value   Potassium 3.2 (*)    Glucose, Bld 136 (*)    Creatinine, Ser 1.28 (*)    Total Protein 6.0 (*)    Albumin 3.4 (*)  GFR, Estimated 43 (*)    All other components within normal limits  CBC WITH DIFFERENTIAL/PLATELET - Abnormal; Notable for the following components:   WBC 3.9 (*)    RDW 18.0 (*)    Neutro Abs 1.6 (*)    All other components within normal limits  URINALYSIS, ROUTINE W REFLEX MICROSCOPIC - Abnormal; Notable for the following components:   APPearance HAZY (*)    pH 8.5 (*)    All other components within normal limits  MAGNESIUM - Abnormal; Notable for the following components:   Magnesium 1.6 (*)    All other components within normal limits  CBG MONITORING, ED - Abnormal; Notable for the following components:   Glucose-Capillary 133 (*)    All other components within normal limits  I-STAT CHEM 8, ED - Abnormal; Notable for the following components:   Potassium 3.4 (*)    Creatinine, Ser 1.40 (*)    Glucose, Bld 131 (*)    All other components within normal limits  RESP PANEL BY RT-PCR (FLU A&B, COVID) ARPGX2  AMMONIA  ETHANOL  RAPID URINE DRUG SCREEN, HOSP PERFORMED  RAPID URINE DRUG SCREEN, HOSP PERFORMED  VITAMIN B12  FOLATE  TSH  T4, FREE    EKG EKG Interpretation  Date/Time:  Tuesday March 29 2021 11:40:32 EST Ventricular Rate:  65 PR Interval:  142 QRS Duration: 64 QT Interval:  570 QTC Calculation: 593 R Axis:   9 Text Interpretation: Sinus rhythm Abnormal R-wave progression, early transition Consider anterior infarct Prolonged QT interval Confirmed by Sherwood Gambler [phone]) on 03/29/2021 12:34:59 PM  Radiology CT Head Wo Contrast  Result Date: 03/29/2021 CLINICAL DATA:   Altered mental status EXAM: CT HEAD WITHOUT CONTRAST TECHNIQUE: Contiguous axial images were obtained from the base of the skull through the vertex without intravenous contrast. COMPARISON:  03/01/2021 FINDINGS: Brain: There is no significant dilation of ventricles. Cortical sulci are prominent. Calcifications are seen in the basal ganglia on both sides. There is no focal edema or mass effect. There is decreased density in the periventricular white matter. Vascular: Unremarkable Skull: Unremarkable. Sinuses/Orbits: Unremarkable Other: No significant interval changes are noted. IMPRESSION: No acute intracranial findings are seen in noncontrast CT brain. No significant changes are noted since 03/01/2021. Electronically Signed   By: Elmer Picker M.D.   On: 03/29/2021 10:40   DG Chest Portable 1 View  Result Date: 03/29/2021 CLINICAL DATA:  Altered mental status EXAM: PORTABLE CHEST 1 VIEW COMPARISON:  10/11/2020 FINDINGS: Heart and mediastinal contours are within normal limits. No focal opacities or effusions. No acute bony abnormality. IMPRESSION: No active disease. Electronically Signed   By: Rolm Baptise M.D.   On: 03/29/2021 09:53    Procedures Procedures   Medications Ordered in ED Medications  labetalol (NORMODYNE) tablet 100 mg (has no administration in time range)  OLANZapine (ZYPREXA) tablet 10 mg (has no administration in time range)  topiramate (TOPAMAX) tablet 100 mg (has no administration in time range)  clonazePAM (KLONOPIN) tablet 1 mg (has no administration in time range)  enoxaparin (LOVENOX) injection 40 mg (has no administration in time range)  prochlorperazine (COMPAZINE) injection 5 mg (has no administration in time range)  acetaminophen (TYLENOL) tablet 650 mg (has no administration in time range)    Or  acetaminophen (TYLENOL) suppository 650 mg (has no administration in time range)  sodium chloride 0.9 % bolus 1,000 mL (0 mLs Intravenous Stopped 03/29/21 1437)   potassium chloride SA (KLOR-CON M) CR tablet 40 mEq (40 mEq Oral  Given 03/29/21 1335)  magnesium sulfate IVPB 2 g 50 mL (0 g Intravenous Stopped 03/29/21 1438)    ED Course  I have reviewed the triage vital signs and the nursing notes.  Pertinent labs & imaging results that were available during my care of the patient were reviewed by me and considered in my medical decision making (see chart for details).    MDM Rules/Calculators/A&P                           At first patient was sleepy and not quite herself.  However while in the ED she has become much more alert and now seems to be completely back to baseline.  Unclear if she had a seizure versus a syncopal episode.  She does have a prolonged QTC on ECGs as well as hypomagnesemia and hypokalemia which will need to be repleted.  Given this I think she should be admitted for observation.  Dr. Carles Collet to admit. Final Clinical Impression(s) / ED Diagnoses Final diagnoses:  Transient alteration of awareness  Hypokalemia  Hypomagnesemia    Rx / DC Orders ED Discharge Orders     None        Sherwood Gambler, MD 03/29/21 1534

## 2021-03-29 NOTE — ED Triage Notes (Signed)
Pt daughter reports pt went unresponsive in car.

## 2021-03-29 NOTE — Progress Notes (Signed)
EEG completed, results pending. 

## 2021-03-29 NOTE — ED Notes (Signed)
PT alert and oriented to person, place and situation. Pt did not know the date or year.

## 2021-03-29 NOTE — Plan of Care (Signed)

## 2021-03-29 NOTE — H&P (Addendum)
History and Physical  Allison Watson JME:[id number] DOB: Jan 09, 1941 DOA: 03/29/2021   PCP: Celene Squibb, MD   Patient coming from: Home  Chief Complaint: altered mental status  HPI:  Allison Watson is a 80 y.o. female with medical history of CKD stage III, hypertension, anemia of CKD, gouty arthritis presenting with altered mental status. Apparently the patient was at the bank with her daughter at the [place] [employer].  They were at the bank the patient bills.  The patient became confused and kept repeating " just give me a minute" on multiple occasions.  Subsequently, the patient closed her eyes and laid her head back onto the headrest.  Shortly thereafter, the patient slumped over unresponsive onto her daughter who was driving.  Her daughter immediately brought her to the emergency department where emergency personnel got the patient out of the car.  The patient's daughter states that by the time the patient got into the room she began waking up.  The entire episode lasted about 20 minutes according to the daughter's recollection.  There was no tonic-clonic activity.  The patient has not had any fevers, chills, headache, chest pain, shortness breath, cough, hemoptysis, nausea, vomiting, diarrhea.  The patient had been in her usual state of health.  She has not started on any new medications.  She does not take any over-the-counter medications that are new. When EDP initially evaluated the patient, the patient was still a bit lethargic but awake.  As the patient's course progressed in the emergency department, she became more awake and conversant albeit a little confused. At the time of my evaluation, the patient is alert and oriented x2.  She is unaware of the date or year.  However she states that she knew that she was at the bank paying some bills, and she realized she got confused.  Notably, the patient had a hospital admission from 10/05/2020 to 10/09/2020 due to hallucinations.  She had an  extensive work-up including an LP which was unremarkable for infection.  Cytology was negative for any malignant cells.  She was found to be B12 deficient and this was repleted.  At the time of discharge, the patient was back to baseline.  However she was readmitted to the hospital the next day from 10/10/2020 to 10/16/2020.  She was transferred to [facility] because of concerns for seizure.  She underwent EEG 24-hour monitoring which did not show any seizure.  She had another EEG 12-hour monitoring episode Which did not show any seizure.  Her mental status improved and the patient was subsequently discharged back home.  It appears the patient has been on olanzapine since that time.  In the emergency department, the patient was afebrile hemodynamically stable with oxygen saturation 100% room air.  BMP shows sodium 142, potassium 3.2, serum creatinine 1.28.  Magnesium was 1.6.  LFTs were unremarkable.  WBC 3.9, hemoglobin 12.9, platelets 1-56,000.  CT of the brain was negative for acute findings.  Chest x-ray was negative.  EKG showed prolonged QTC with sinus rhythm.  The patient was admitted for further evaluation and treatment of her altered mental status.  Assessment/Plan: Acute encephalopathy -Suspicious for possible seizure -Mental status continues to improve throughout her ED course -EEG -Neurology consult -Ammonia 13 -UA negative for pyuria -I29 -Folic acid -TSH -Echo  Tardive dyskinesia -The patient has some extraparametal movement movements on exam -Likely due to her chronic use of olanzapine  Essential hypertension -Continue labetalol and hydralazine  CKD stage  IIIb -Baseline creatinine 1.3-1.6 -Monitor clinically  Hypokalemia/Hypomagnesemia -replete  Obesity -BMI 34.18 -lifestyle modification      Past Medical History:  Diagnosis Date   Arthritis    Lt arm   Gout    Hypertension    Past Surgical History:  Procedure Laterality Date   ABDOMINAL HYSTERECTOMY      TUBAL LIGATION     Social History:  reports that she has quit smoking. She has never used smokeless tobacco. She reports that she does not drink alcohol and does not use drugs.   History reviewed. No pertinent family history.   Allergies  Allergen Reactions   Codeine Rash   Lorazepam Other (See Comments)    Hallucinations per daughter [name]   Penicillins Rash    Has patient had a PCN reaction causing immediate rash, facial/tongue/throat swelling, SOB or lightheadedness with hypotension: Yes Has patient had a PCN reaction causing severe rash involving mucus membranes or skin necrosis: No Has patient had a PCN reaction that required hospitalization No Has patient had a PCN reaction occurring within the last 10 years: No If all of the above answers are "NO", then may proceed with Cephalosporin use.      Prior to Admission medications   Medication Sig Start Date End Date Taking? Authorizing Provider  albuterol (VENTOLIN HFA) 108 (90 Base) MCG/ACT inhaler Inhale 1-2 puffs into the lungs every 6 (six) hours as needed for wheezing or shortness of breath. Will use when too hot/ too cold outside    [provider]  cholecalciferol (VITAMIN D3) 25 MCG (1000 UNIT) tablet Take 1,000 Units by mouth daily.    [provider]  clonazePAM (KLONOPIN) 1 MG tablet Take 1 mg by mouth 2 (two) times daily as needed. 02/14/21   [provider]  diltiazem (CARDIZEM CD) 240 MG 24 hr capsule Take 240 mg by mouth daily. 07/20/20   [provider]  doxycycline (VIBRAMYCIN) 100 MG capsule Take 100 mg by mouth 2 (two) times daily. 02/14/21   [provider]  hydrALAZINE (APRESOLINE) 100 MG tablet Take 1 tablet (100 mg total) by mouth 2 (two) times daily. 10/09/20   Roxan Hockey, MD  labetalol (NORMODYNE) 100 MG tablet Take 1 tablet (100 mg total) by mouth 2 (two) times daily. 10/09/20   Roxan Hockey, MD  lisinopril-hydrochlorothiazide (ZESTORETIC) 20-12.5 MG  tablet Take 1 tablet by mouth daily.    [provider]  naproxen (NAPROSYN) 500 MG tablet Take 500 mg by mouth daily as needed (pain in arm).    [provider]  OLANZapine (ZYPREXA) 10 MG tablet Take 1 tablet (10 mg total) by mouth at bedtime. 10/09/20   Roxan Hockey, MD  topiramate (TOPAMAX) 100 MG tablet Take 1 tablet (100 mg total) by mouth 2 (two) times daily. 10/09/20   Roxan Hockey, MD    Review of Systems:  Constitutional:  No weight loss, night sweats, Fevers, chills, fatigue.  Head&Eyes: No headache.  No vision loss.  No eye pain or scotoma ENT:  No Difficulty swallowing,Tooth/dental problems,Sore throat,  No ear ache, post nasal drip,  Cardio-vascular:  No chest pain, Orthopnea, PND, swelling in lower extremities,  dizziness, palpitations  GI:  No  abdominal pain, nausea, vomiting, diarrhea, loss of appetite, hematochezia, melena, heartburn, indigestion, Resp:  No shortness of breath with exertion or at rest. No cough. No coughing up of blood .No wheezing.No chest wall deformity  Skin:  no rash or lesions.  GU:  no dysuria, change in color  of urine, no urgency or frequency. No flank pain.  Musculoskeletal:  No joint pain or swelling. No decreased range of motion. No back pain.  Psych:  No change in mood or affect. No depression or anxiety. Neurologic: No headache, no dysesthesia, no focal weakness, no vision loss. No syncope  Physical Exam: Vitals:   03/29/21 1240 03/29/21 1245 03/29/21 1255 03/29/21 1300  BP: (!) 158/78   (!) 168/77  Pulse:  73 72 70  Resp: (!) 22 (!) 29  (!) 27  Temp:      TempSrc:      SpO2:  99% 99% 98%  Weight:      Height:       General:  A&O x 2, NAD, nontoxic, pleasant/cooperative Head/Eye: No conjunctival hemorrhage, no icterus, [place]/AT, No nystagmus ENT:  No icterus,  No thrush, good dentition, no pharyngeal exudate Neck:  No masses, no lymphadenpathy, no bruits CV:  RRR, no rub, no gallop, no S3 Lung:  CTAB,  good air movement, no wheeze, no rhonchi Abdomen: soft/NT, +BS, nondistended, no peritoneal signs Ext: No cyanosis, No rashes, No petechiae, No lymphangitis, No edema Neuro: CNII-XII intact, strength 4/5 in bilateral upper and lower extremities, no dysmetria  Labs on Admission:  Basic Metabolic Panel: Recent Labs  Lab 03/29/21 0937  NA 142  144  K 3.2*  3.4*  CL 109  107  CO2 25  GLUCOSE 136*  131*  BUN 11  9  CREATININE 1.28*  1.40*  CALCIUM 9.3  MG 1.6*   Liver Function Tests: Recent Labs  Lab 03/29/21 0937  AST 20  ALT 13  ALKPHOS 57  BILITOT 0.3  PROT 6.0*  ALBUMIN 3.4*   No results for input(s): LIPASE, AMYLASE in the last 168 hours. Recent Labs  Lab 03/29/21 0937  AMMONIA 17   CBC: Recent Labs  Lab 03/29/21 0937  WBC 3.9*  NEUTROABS 1.6*  HGB 12.9  12.0  HCT 38.0  37.0  MCV 82.6  PLT 156   Coagulation Profile: No results for input(s): INR, PROTIME in the last 168 hours. Cardiac Enzymes: No results for input(s): CKTOTAL, CKMB, CKMBINDEX, TROPONINI in the last 168 hours. BNP: Invalid input(s): POCBNP CBG: Recent Labs  Lab 03/29/21 0921  GLUCAP 133*   Urine analysis:    Component Value Date/Time   COLORURINE YELLOW 03/29/2021 1248   APPEARANCEUR HAZY (A) 03/29/2021 1248   LABSPEC 1.015 03/29/2021 1248   PHURINE 8.5 (H) 03/29/2021 1248   GLUCOSEU NEGATIVE 03/29/2021 1248   HGBUR NEGATIVE 03/29/2021 1248   BILIRUBINUR NEGATIVE 03/29/2021 1248   KETONESUR NEGATIVE 03/29/2021 1248   PROTEINUR NEGATIVE 03/29/2021 1248   NITRITE NEGATIVE 03/29/2021 1248   LEUKOCYTESUR NEGATIVE 03/29/2021 1248   Sepsis Labs: @LABRCNTIP (procalcitonin:4,lacticidven:4) ) Recent Results (from the past 240 hour(s))  Resp Panel by RT-PCR (Flu A&B, Covid) Nasopharyngeal Swab     Status: None   Collection Time: 03/29/21  9:57 AM   Specimen: Nasopharyngeal Swab; Nasopharyngeal(NP) swabs in vial transport medium  Result Value Ref Range Status   SARS  Coronavirus 2 by RT PCR NEGATIVE NEGATIVE Final    Comment: (NOTE) SARS-CoV-2 target nucleic acids are NOT DETECTED.  The SARS-CoV-2 RNA is generally detectable in upper respiratory specimens during the acute phase of infection. The lowest concentration of SARS-CoV-2 viral copies this assay can detect is 138 copies/mL. A negative result does not preclude SARS-Cov-2 infection and should not be used as the sole basis for treatment or other patient management decisions. A negative  result may occur with  improper specimen collection/handling, submission of specimen other than nasopharyngeal swab, presence of viral mutation(s) within the areas targeted by this assay, and inadequate number of viral copies(<138 copies/mL). A negative result must be combined with clinical observations, patient history, and epidemiological information. The expected result is Negative.  Fact Sheet for Patients:  EntrepreneurPulse.com.au  Fact Sheet for Healthcare Providers:  IncredibleEmployment.be  This test is no t yet approved or cleared by the [place] FDA and  has been authorized for detection and/or diagnosis of SARS-CoV-2 by FDA under an Emergency Use Authorization (EUA). This EUA will remain  in effect (meaning this test can be used) for the duration of the COVID-19 declaration under Section 564(b)(1) of the Act, 21 U.S.C.section 360bbb-3(b)(1), unless the authorization is terminated  or revoked sooner.       Influenza A by PCR NEGATIVE NEGATIVE Final   Influenza B by PCR NEGATIVE NEGATIVE Final    Comment: (NOTE) The Xpert Xpress SARS-CoV-2/FLU/RSV plus assay is intended as an aid in the diagnosis of influenza from Nasopharyngeal swab specimens and should not be used as a sole basis for treatment. Nasal washings and aspirates are unacceptable for Xpert Xpress SARS-CoV-2/FLU/RSV testing.  Fact Sheet for  Patients: EntrepreneurPulse.com.au  Fact Sheet for Healthcare Providers: IncredibleEmployment.be  This test is not yet approved or cleared by the [place] FDA and has been authorized for detection and/or diagnosis of SARS-CoV-2 by FDA under an Emergency Use Authorization (EUA). This EUA will remain in effect (meaning this test can be used) for the duration of the COVID-19 declaration under Section 564(b)(1) of the Act, 21 U.S.C. section 360bbb-3(b)(1), unless the authorization is terminated or revoked.  Performed at [facility], [address], [place], [place] [address]      Radiological Exams on Admission: CT Head Wo Contrast  Result Date: 03/29/2021 CLINICAL DATA:  Altered mental status EXAM: CT HEAD WITHOUT CONTRAST TECHNIQUE: Contiguous axial images were obtained from the base of the skull through the vertex without intravenous contrast. COMPARISON:  03/01/2021 FINDINGS: Brain: There is no significant dilation of ventricles. Cortical sulci are prominent. Calcifications are seen in the basal ganglia on both sides. There is no focal edema or mass effect. There is decreased density in the periventricular white matter. Vascular: Unremarkable Skull: Unremarkable. Sinuses/Orbits: Unremarkable Other: No significant interval changes are noted. IMPRESSION: No acute intracranial findings are seen in noncontrast CT brain. No significant changes are noted since 03/01/2021. Electronically Signed   By: Elmer Picker M.D.   On: 03/29/2021 10:40   DG Chest Portable 1 View  Result Date: 03/29/2021 CLINICAL DATA:  Altered mental status EXAM: PORTABLE CHEST 1 VIEW COMPARISON:  10/11/2020 FINDINGS: Heart and mediastinal contours are within normal limits. No focal opacities or effusions. No acute bony abnormality. IMPRESSION: No active disease. Electronically Signed   By: Rolm Baptise M.D.   On: 03/29/2021 09:53    EKG: Independently reviewed. Sinus,  nonspecific TWI    Time spent:60 minutes Code Status:   FULL Family Communication:  daughter updated 12/6 Disposition Plan: expect 1-2 day hospitalization Consults called: neurology  DVT Prophylaxis: [place] Lovenox  Orson Eva, DO  [facility] Pager [phone]  If 7PM-7AM, please contact night-coverage [email or web address] Password [email or web address] 03/29/2021, 1:45 PM

## 2021-03-29 NOTE — Progress Notes (Signed)
*  PRELIMINARY RESULTS* Echocardiogram 2D Echocardiogram has been performed.  Allison Watson 03/29/2021, 3:49 PM

## 2021-03-29 NOTE — ED Notes (Signed)
Put brief on pt to secure the purewick

## 2021-03-30 ENCOUNTER — Observation Stay (HOSPITAL_COMMUNITY): Payer: Medicare HMO

## 2021-03-30 DIAGNOSIS — G9341 Metabolic encephalopathy: Secondary | ICD-10-CM

## 2021-03-30 DIAGNOSIS — R404 Transient alteration of awareness: Secondary | ICD-10-CM

## 2021-03-30 DIAGNOSIS — N179 Acute kidney failure, unspecified: Secondary | ICD-10-CM | POA: Diagnosis not present

## 2021-03-30 DIAGNOSIS — N1831 Chronic kidney disease, stage 3a: Secondary | ICD-10-CM | POA: Diagnosis not present

## 2021-03-30 DIAGNOSIS — R29818 Other symptoms and signs involving the nervous system: Secondary | ICD-10-CM

## 2021-03-30 DIAGNOSIS — G9340 Encephalopathy, unspecified: Secondary | ICD-10-CM

## 2021-03-30 DIAGNOSIS — D529 Folate deficiency anemia, unspecified: Secondary | ICD-10-CM | POA: Diagnosis not present

## 2021-03-30 LAB — CBC
HCT: 38.3 % (ref 36.0–46.0)
Hemoglobin: 12 g/dL (ref 12.0–15.0)
MCH: 25.7 pg — ABNORMAL LOW (ref 26.0–34.0)
MCHC: 31.3 g/dL (ref 30.0–36.0)
MCV: 82 fL (ref 80.0–100.0)
Platelets: 163 10*3/uL (ref 150–400)
RBC: 4.67 MIL/uL (ref 3.87–5.11)
RDW: 18.5 % — ABNORMAL HIGH (ref 11.5–15.5)
WBC: 4.2 10*3/uL (ref 4.0–10.5)
nRBC: 0 % (ref 0.0–0.2)

## 2021-03-30 LAB — MAGNESIUM: Magnesium: 2.1 mg/dL (ref 1.7–2.4)

## 2021-03-30 LAB — BASIC METABOLIC PANEL
Anion gap: 9 (ref 5–15)
BUN: 10 mg/dL (ref 8–23)
CO2: 21 mmol/L — ABNORMAL LOW (ref 22–32)
Calcium: 9.6 mg/dL (ref 8.9–10.3)
Chloride: 114 mmol/L — ABNORMAL HIGH (ref 98–111)
Creatinine, Ser: 1.3 mg/dL — ABNORMAL HIGH (ref 0.44–1.00)
GFR, Estimated: 42 mL/min — ABNORMAL LOW (ref >60)
Glucose, Bld: 103 mg/dL — ABNORMAL HIGH (ref 70–99)
Potassium: 3.7 mmol/L (ref 3.5–5.1)
Sodium: 144 mmol/L (ref 135–145)

## 2021-03-30 IMAGING — US US CAROTID DUPLEX BILAT
1 series · 13 of 24 positions shown · non-contrast
Comparison: None.

CLINICAL DATA: TIA symptoms, hypertension

EXAM:
BILATERAL CAROTID DUPLEX ULTRASOUND
TECHNIQUE: Gray scale imaging, color Doppler and duplex ultrasound were
performed of bilateral carotid and vertebral arteries in the neck.

[Series 1: us carotid bilateral · 13 of 69 slices shown]
[im 1/69]
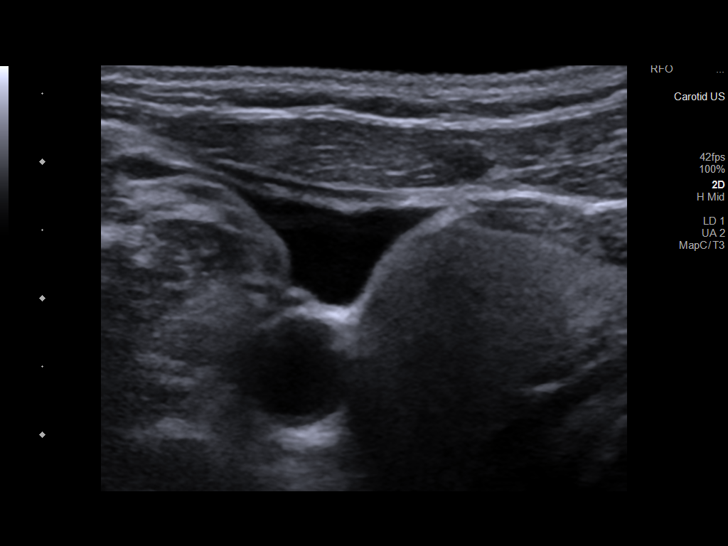
[im 6/69]
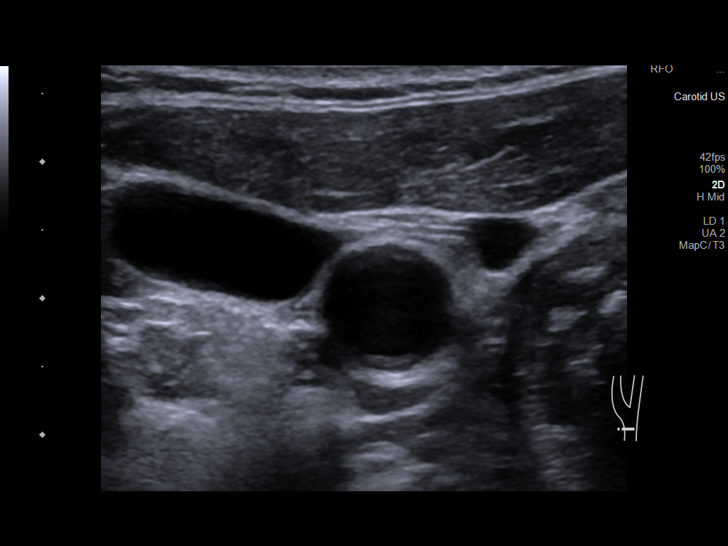
[im 12/69]
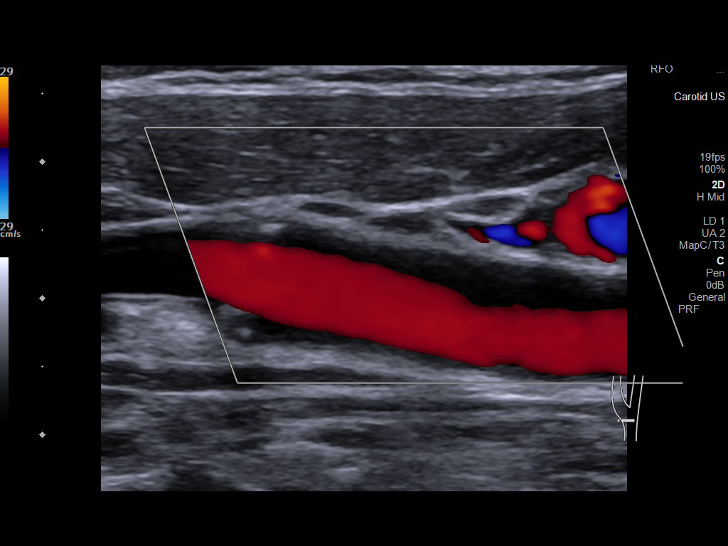
[im 18/69]
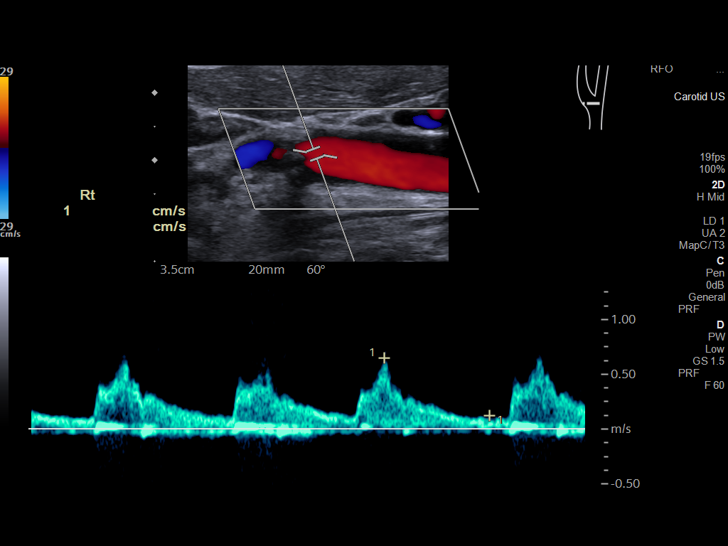
[im 24/69]
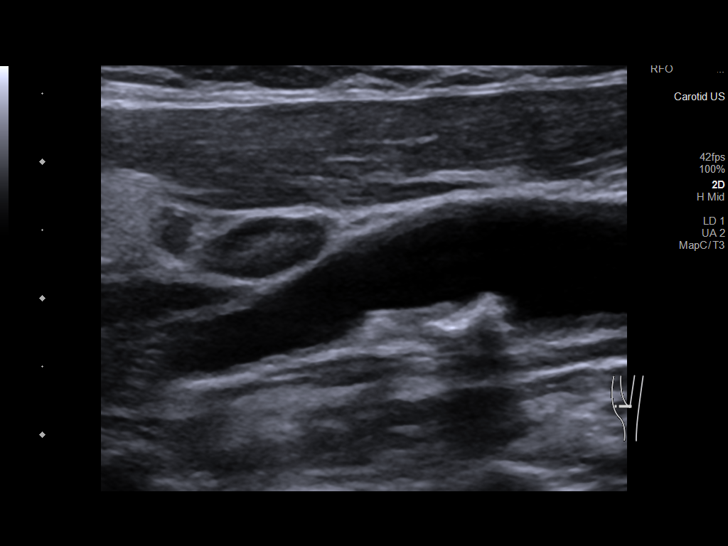
[im 30/69]
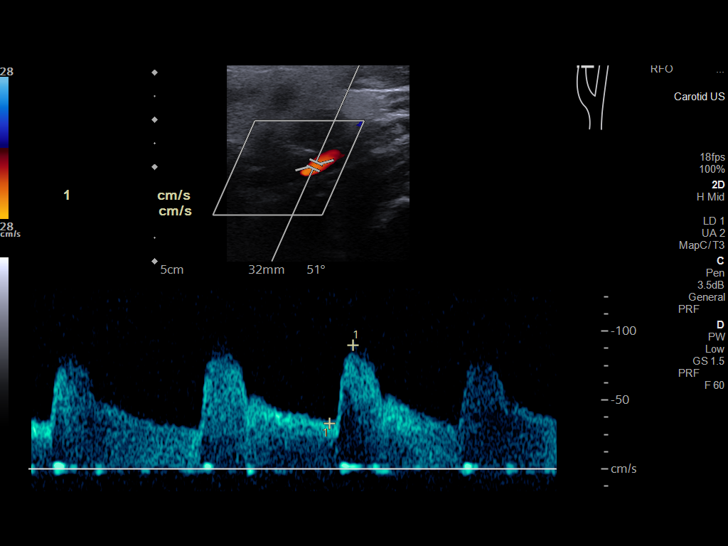
[im 36/69]
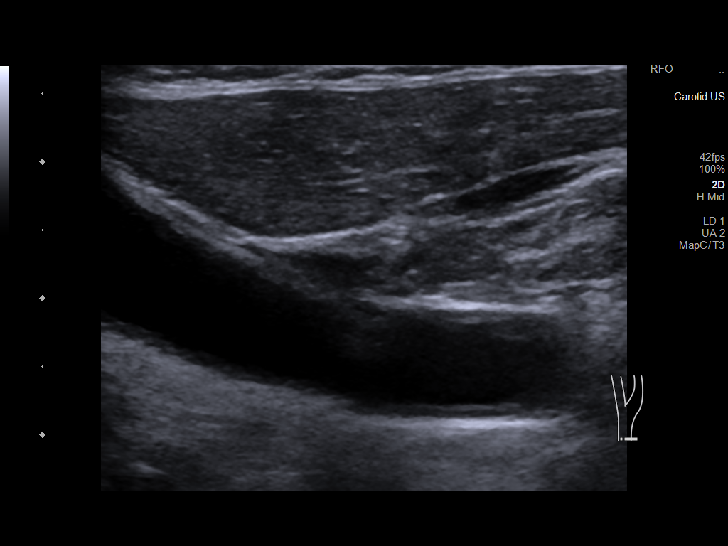
[im 39/69]
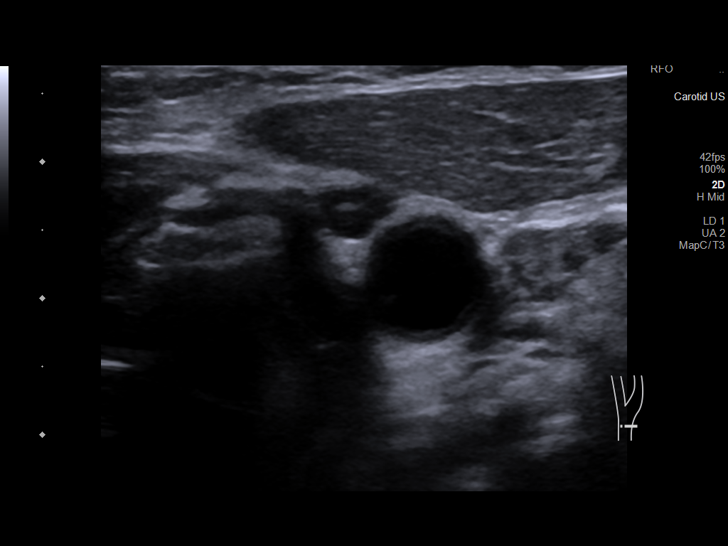
[im 45/69]
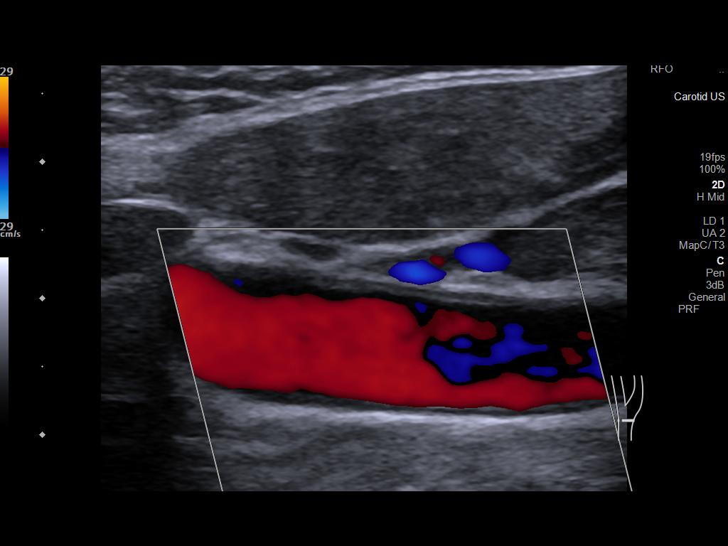
[im 51/69]
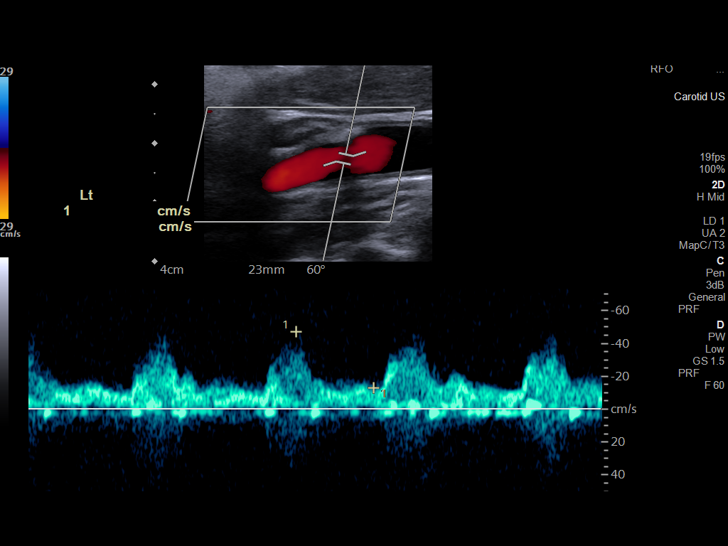
[im 57/69]
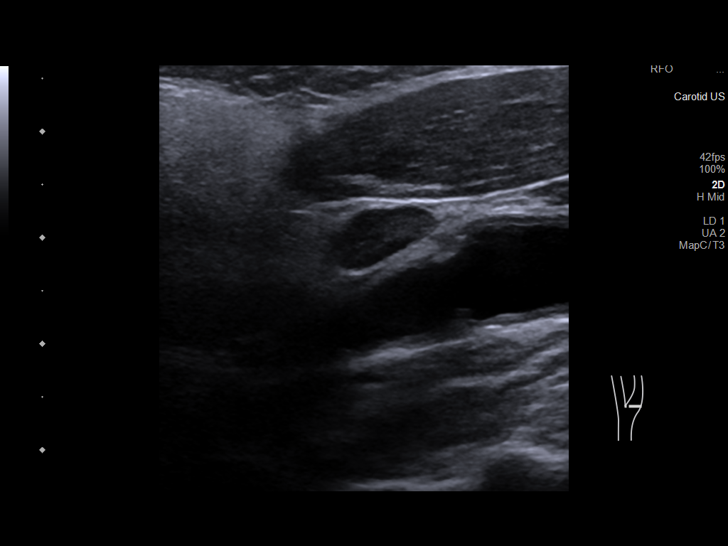
[im 63/69]
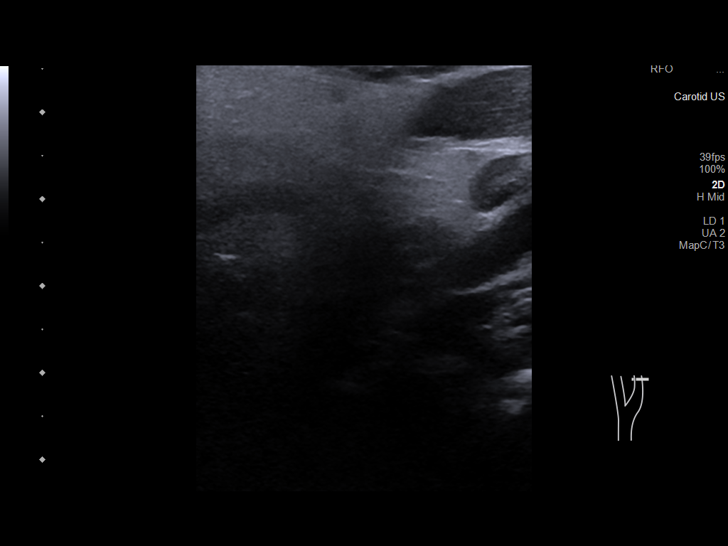
[im 69/69]
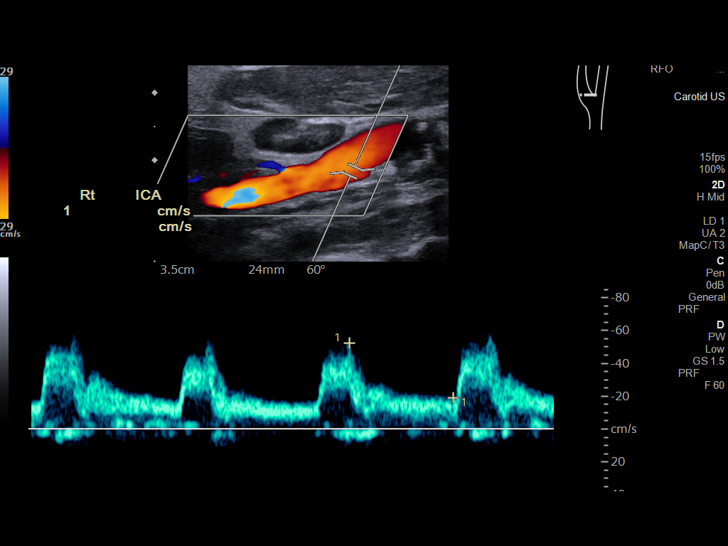

[13 of 24 positions shown; findings below may reference images not displayed]

FINDINGS: Criteria: Quantification of carotid stenosis is based on velocity
parameters that correlate the residual internal carotid diameter
with NASCET-based stenosis levels, using the diameter of the distal
internal carotid lumen as the denominator for stenosis measurement.

The following velocity measurements were obtained:

RIGHT

ICA: 104/47 cm/sec

CCA: 165/12 cm/sec

SYSTOLIC ICA/CCA RATIO:

ECA: 77 cm/sec

LEFT

ICA: 89/25 cm/sec

CCA: 147/13 cm/sec

SYSTOLIC ICA/CCA RATIO:

ECA: 80 cm/sec

RIGHT CAROTID ARTERY: Mild to moderate heterogeneous partially
calcified bifurcation atherosclerosis. Despite this, no significant
right ICA stenosis, velocity elevation, turbulent flow. Degree of
narrowing less than 50% by ultrasound criteria.

RIGHT VERTEBRAL ARTERY:  Normal antegrade flow

LEFT CAROTID ARTERY: Similar scattered minor echogenic plaque
formation. No hemodynamically significant left ICA stenosis,
velocity elevation, or turbulent flow.

LEFT VERTEBRAL ARTERY:  Normal antegrade flow
IMPRESSION: Bilateral carotid atherosclerosis. Negative for stenosis. Degree of
narrowing less than 50% bilaterally by ultrasound criteria.

Patent antegrade vertebral flow bilaterally

## 2021-03-30 IMAGING — MR MR HEAD W/O CM
7 series · 48 of 48 positions shown · non-contrast
Comparison: [DATE]

CLINICAL DATA: Seizure, new-onset, no history of trauma; mental
status change, persistent or worsening

EXAM:
MRI HEAD WITHOUT CONTRAST
TECHNIQUE: Multiplanar, multiecho pulse sequences of the brain and surrounding
structures were obtained without intravenous contrast.

[Series 9: DWI · axial · 4.0mm · 0.88mm/px · z∈[-70,+63]mm · 8 of 36 slices shown (1 of 4)]
[im 1/36]
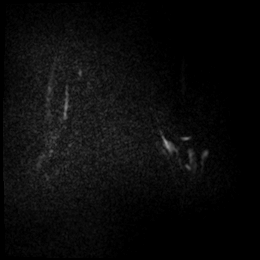
[im 6/36]
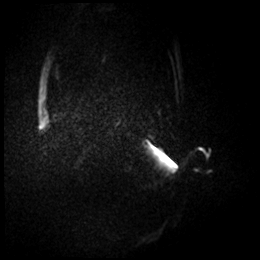
[im 11/36]
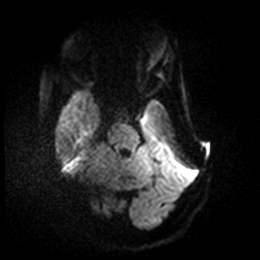
[im 16/36]
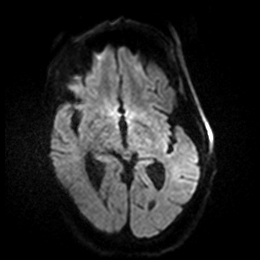
[im 21/36]
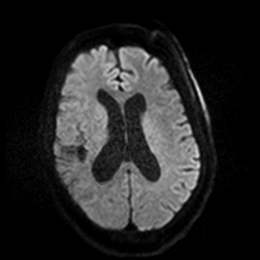
[im 26/36]
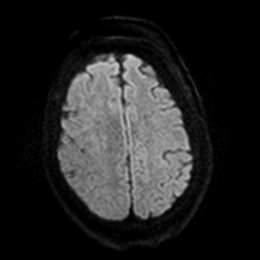
[im 31/36]
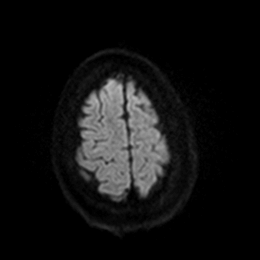
[im 36/36]
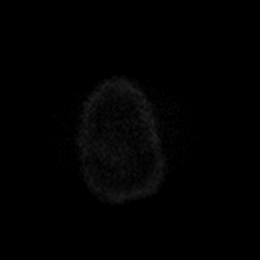

[Series 10: DWI · axial · 4.0mm · 0.88mm/px · z∈[-70,+63]mm · 8 of 36 slices shown (2 of 4)]
[im 1/36]
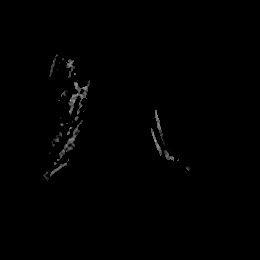
[im 6/36]
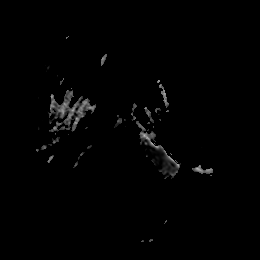
[im 11/36]
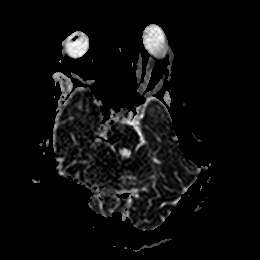
[im 16/36]
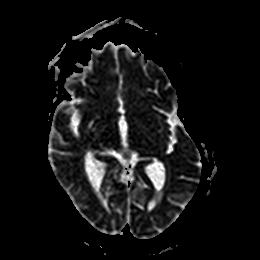
[im 21/36]
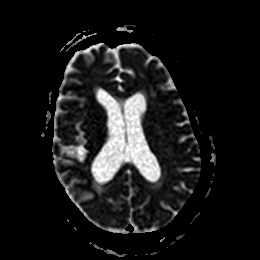
[im 26/36]
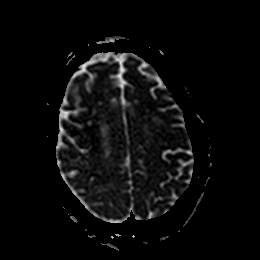
[im 31/36]
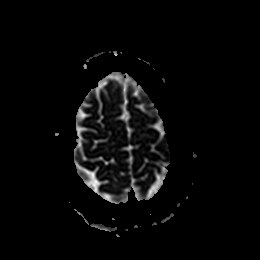
[im 36/36]
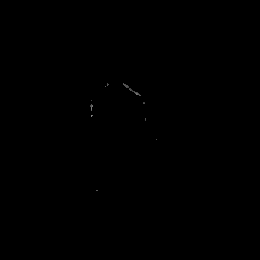

[Series 11: DWI · coronal · 4.0mm · 0.88mm/px · 8 of 34 slices shown (3 of 4)]
[im 1/34]
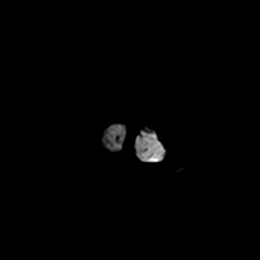
[im 5/34]
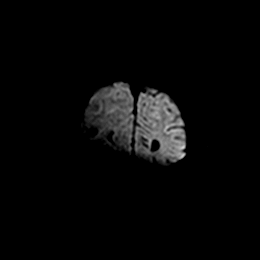
[im 10/34]
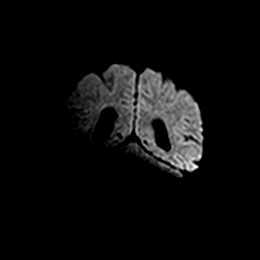
[im 15/34]
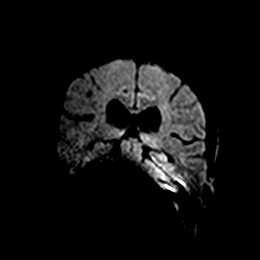
[im 19/34]
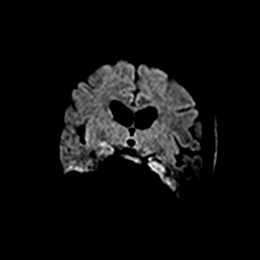
[im 24/34]
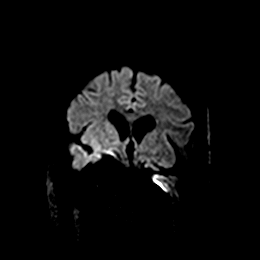
[im 29/34]
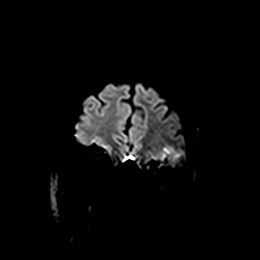
[im 34/34]
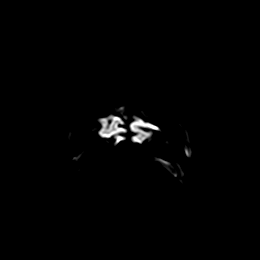

[Series 12: DWI · coronal · 4.0mm · 0.88mm/px · 8 of 34 slices shown (4 of 4)]
[im 1/34]
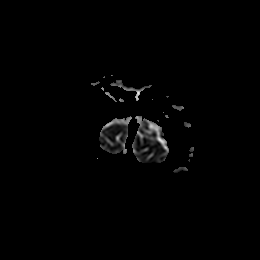
[im 5/34]
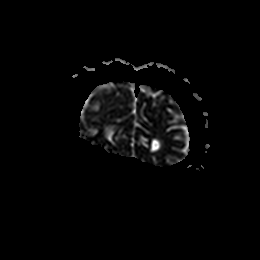
[im 10/34]
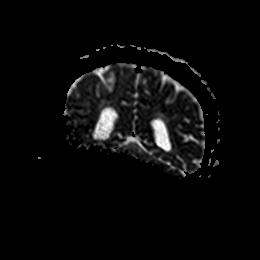
[im 15/34]
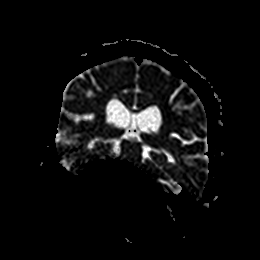
[im 19/34]
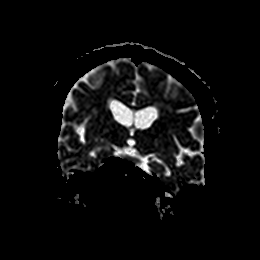
[im 24/34]
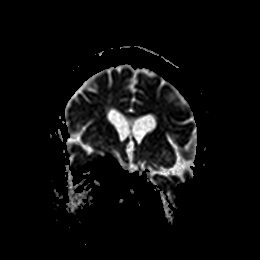
[im 29/34]
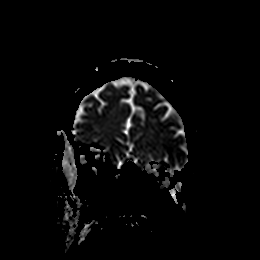
[im 34/34]
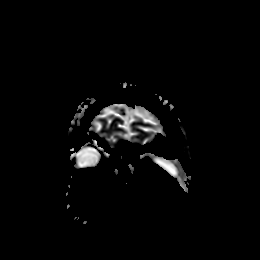

[Series 13: T1 · sagittal · 5.0mm · 0.80mm/px · 5 of 23 slices shown]
[im 1/23]
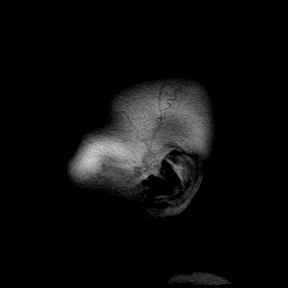
[im 6/23]
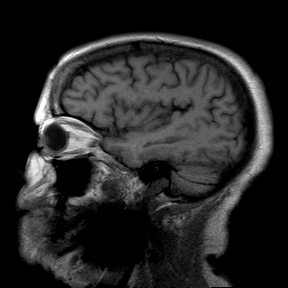
[im 12/23]
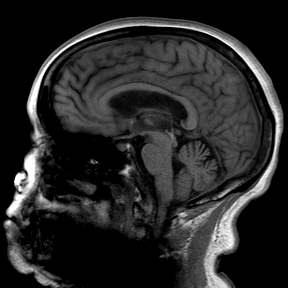
[im 17/23]
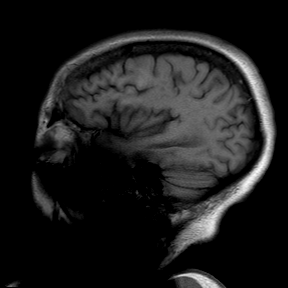
[im 23/23]
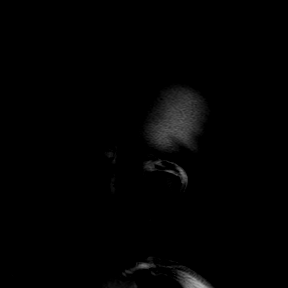

[Series 14: T2 · axial · 5.0mm · 0.72mm/px · z∈[-77,+70]mm · 5 of 23 slices shown]
[im 1/23]
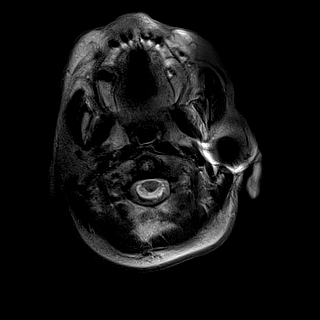
[im 6/23]
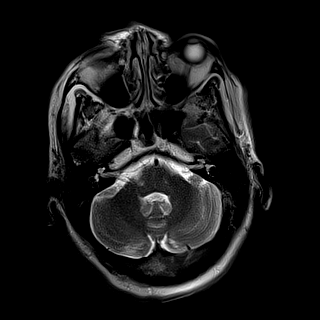
[im 12/23]
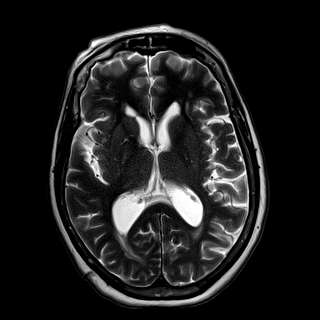
[im 17/23]
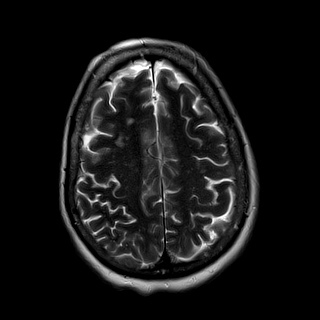
[im 23/23]
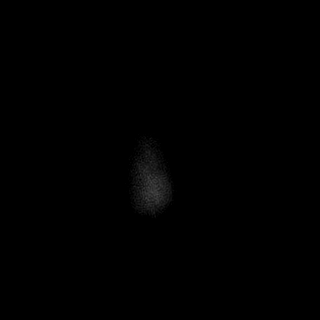

[Series 15: ax hemo · axial · 5.0mm · 0.86mm/px · z∈[-66,+71]mm · 6 of 25 slices shown]
[im 1/25]
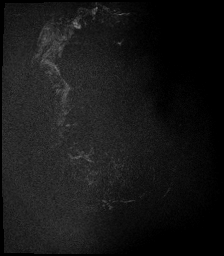
[im 5/25]
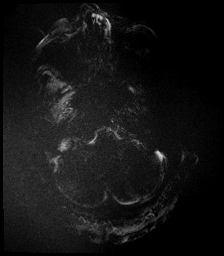
[im 10/25]
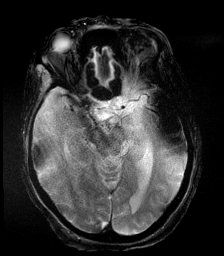
[im 15/25]
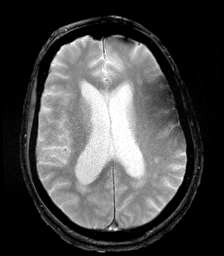
[im 20/25]
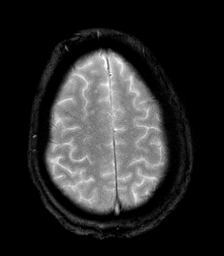
[im 25/25]
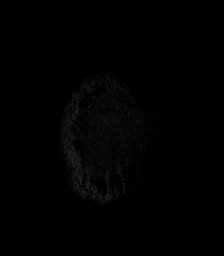

[48 of 48 positions shown; findings below may reference images not displayed]

FINDINGS: DWI, GRE, and axial T2 sequences were obtained. Patient could not
complete remainder of the study. Artifact obscures much of the
posterior fossa on DWI. There is even greater obscuration on GRE.

Brain: Within above limitation, there is no evidence of acute
infarction or evidence of hemorrhage. There is no intracranial mass,
mass effect, or edema. There is no hydrocephalus or extra-axial
fluid collection. Prominence of the ventricles and sulci reflects
parenchymal volume loss. Patchy foci of T2 hyperintensity in the
supratentorial white matter are nonspecific but may reflect chronic
microvascular ischemic changes.

Vascular: Major vessel flow voids at the skull base are preserved.

Skull and upper cervical spine: Normal marrow signal is preserved.

Sinuses/Orbits: Mild mucosal thickening.  Orbits are unremarkable.

Other: Sella is unremarkable.  Mastoid air cells are clear.
IMPRESSION: No acute infarction or other acute abnormality within limitation of
artifact.

## 2021-03-30 MED ORDER — FOLIC ACID 1 MG PO TABS
2.0000 mg | ORAL_TABLET | Freq: Every day | ORAL | Status: DC
  Administered 2021-03-31: 2 mg via ORAL
  Filled 2021-03-30: qty 2

## 2021-03-30 MED ORDER — HYDRALAZINE HCL 25 MG PO TABS
100.0000 mg | ORAL_TABLET | Freq: Three times a day (TID) | ORAL | Status: DC
  Administered 2021-03-30 – 2021-03-31 (×3): 100 mg via ORAL
  Filled 2021-03-30 (×3): qty 4

## 2021-03-30 MED ORDER — DIVALPROEX SODIUM 250 MG PO DR TAB
250.0000 mg | DELAYED_RELEASE_TABLET | Freq: Two times a day (BID) | ORAL | Status: DC
  Administered 2021-03-30 – 2021-03-31 (×2): 250 mg via ORAL
  Filled 2021-03-30 (×2): qty 1

## 2021-03-30 MED ORDER — FOLIC ACID 5 MG/ML IJ SOLN
2.0000 mg | Freq: Every day | INTRAMUSCULAR | Status: DC
  Administered 2021-03-30: 2 mg via INTRAVENOUS
  Filled 2021-03-30: qty 0.4

## 2021-03-30 MED ORDER — DIAZEPAM 2 MG PO TABS
2.0000 mg | ORAL_TABLET | ORAL | Status: DC | PRN
  Filled 2021-03-30: qty 1

## 2021-03-30 MED ORDER — DIAZEPAM 5 MG/ML IJ SOLN
2.5000 mg | Freq: Once | INTRAMUSCULAR | Status: AC
  Administered 2021-03-30: 2.5 mg via INTRAVENOUS
  Filled 2021-03-30: qty 2

## 2021-03-30 MED ORDER — DIAZEPAM 5 MG/ML IJ SOLN: 2.5000 mg | Freq: Once | INTRAMUSCULAR | Status: DC

## 2021-03-30 NOTE — Progress Notes (Signed)
PROGRESS NOTE    Patient: Allison Watson                            PCP: Celene Squibb, MD                    DOB: July 11, 1940            DOA: 03/29/2021 UYQ:[id number]             DOS: 03/30/2021, 10:18 AM   LOS: 0 days   Date of Service: The patient was seen and examined on 03/30/2021  Subjective:   The patient was seen and examined this morning. Stable at this time. No complaining today-she states that she does not remember the episode yesterday. Otherwise no issues overnight .  Brief Narrative:   Allison Watson is a 80 y.o. female with medical history of CKD stage III, hypertension, anemia of CKD, gouty arthritis presenting with altered mental status. Apparently the patient was at the bank with her daughter at the [place].  They were at the bank the patient bills.  The patient became confused and kept repeating " just give me a minute" on multiple occasions.  Subsequently, the patient closed her eyes and laid her head back onto the headrest.  Shortly thereafter, the patient slumped over unresponsive onto her daughter who was driving.  Her daughter immediately brought her to the emergency department where emergency personnel got the patient out of the car.  The patient's daughter states that by the time the patient got into the room she began waking up.  The entire episode lasted about 20 minutes according to the daughter's recollection.  There was no tonic-clonic activity.  The patient has not had any fevers, chills, headache, chest pain, shortness breath, cough, hemoptysis, nausea, vomiting, diarrhea.  The patient had been in her usual state of health.  She has not started on any new medications.  She does not take any over-the-counter medications that are new. When EDP initially evaluated the patient, the patient was still a bit lethargic but awake.  As the patient's course progressed in the emergency department, she became more awake and conversant albeit a little confused. At the  time of my evaluation, the patient is alert and oriented x2.  She is unaware of the date or year.  However she states that she knew that she was at the bank paying some bills, and she realized she got confused.   Notably, the patient had a hospital admission from 10/05/2020 to 10/09/2020 due to hallucinations.  She had an extensive work-up including an LP which was unremarkable for infection.  Cytology was negative for any malignant cells.  She was found to be B12 deficient and this was repleted.  At the time of discharge, the patient was back to baseline.  However she was readmitted to the hospital the next day from 10/10/2020 to 10/16/2020.  She was transferred to [facility] because of concerns for seizure.  She underwent EEG 24-hour monitoring which did not show any seizure.  She had another EEG 12-hour monitoring episode Which did not show any seizure.  Her mental status improved and the patient was subsequently discharged back home.  It appears the patient has been on olanzapine since that time.   In the emergency department, the patient was afebrile hemodynamically stable with oxygen saturation 100% room air.  BMP shows sodium 142, potassium 3.2, serum creatinine 1.28.  Magnesium  was 1.6.  LFTs were unremarkable.  WBC 3.9, hemoglobin 12.9, platelets 1-56,000.  CT of the brain was negative for acute findings.  Chest x-ray was negative.  EKG showed prolonged QTC with sinus rhythm.    Assessment & Plan:   Principal Problem:   Acute metabolic encephalopathy Active Problems:   Essential hypertension   Obesity (BMI 30-39.9)   Hypomagnesemia   Acute encephalopathy ?  Syncope -Suspicious for possible seizure -neurology consulted for further evaluation appreciate recommendations -Mental status continues to improve throughout her ED course -EEG -reviewed by neurology, within normal limits  -previous records reviewed -Neurology consult -pending -Ammonia 13 -UA negative for pyuria -I96, folic acid,  TSH, within normal limits  -Echo: Fraction 65-70%, negative for any abnormality, calcified aortic valve negative for any stenosis -Discussed with cardiology, due to multiple episodes of syncope, altered mental status, patient will benefit from Holter monitor placement... Will be arranged by cardiology-and follow-up as an outpatient   Tardive dyskinesia -The patient has some extraparametal movement movements on exam -Likely due to her chronic use of olanzapine -Currently stable   Essential hypertension -Hypotensive this morning, resuming home medication -Continue labetalol and hydralazine -V hydralazine   CKD stage IIIb -Baseline creatinine 1.3-1.6 -Creatinine 1.30 -Monitor clinically   Hypokalemia/Hypomagnesemia -replete   Obesity -BMI 34.18 -lifestyle modification    Code Status:   FULL Family Communication:  daughter updated 12/6 Disposition Plan: expect 1-2 day hospitalization Consults called: neurology  DVT Prophylaxis: [place] Lovenox      Family Communication: No family member present at bedside- attempt will be made to update daily The above findings and plan of care has been discussed with patient (and family)  in detail,  they expressed understanding and agreement of above. -Advance care planning has been discussed.   Admission status:   Status is: Observation  The patient remains OBS appropriate and will d/c before 2 midnights.  (Awaiting evaluation by neurology, also consult cardiology for Holter monitor placement -  Will continue to monitor closely, awaiting PT, and ambulation, try to elicit symptoms again if negative likely will be discharged Home in a.m.)   Level of care: Telemetry   Procedures:   No admission procedures for hospital encounter.    Antimicrobials:  Anti-infectives (From admission, onward)    None        Medication:   enoxaparin (LOVENOX) injection  40 mg Subcutaneous Q24H   labetalol  100 mg Oral BID   OLANZapine  10 mg  Oral QHS   topiramate  100 mg Oral BID    acetaminophen **OR** acetaminophen, clonazePAM, prochlorperazine   Objective:   Vitals:   03/29/21 1852 03/29/21 2129 03/30/21 0244 03/30/21 0457  BP: (!) 175/67 (!) 166/66 (!) 159/65 (!) 175/90  Pulse: 70 72 65 70  Resp: 20 15 17 16   Temp: 97.8 F (36.6 C) 98.6 F (37 C) 98.6 F (37 C) 98.7 F (37.1 C)  TempSrc: Oral  Oral Oral  SpO2: 98% 96% 95% 98%  Weight:      Height:        Intake/Output Summary (Last 24 hours) at 03/30/2021 1018 Last data filed at 03/30/2021 0447 Gross per 24 hour  Intake 1290 ml  Output 700 ml  Net 590 ml   Filed Weights   03/29/21 0922  Weight: 79.4 kg     Examination:   Physical Exam  Constitution:  Alert, cooperative, no distress,  Appears calm and comfortable  Psychiatric: Normal and stable mood and affect, cognition intact,  HEENT: Normocephalic, PERRL, otherwise with in Normal limits  Chest:Chest symmetric Cardio vascular:  S1/S2, RRR, No murmure, No Rubs or Gallops  pulmonary: Clear to auscultation bilaterally, respirations unlabored, negative wheezes / crackles Abdomen: Soft, non-tender, non-distended, bowel sounds,no masses, no organomegaly Muscular skeletal: Limited exam - in bed, able to move all 4 extremities, Normal strength,  Neuro: CNII-XII intact. , normal motor and sensation, reflexes intact  Extremities: No pitting edema lower extremities, +2 pulses  Skin: Dry, warm to touch, negative for any Rashes, No open wounds Wounds: per nursing documentation    ------------------------------------------------------------------------------------------------------------------------------------------    LABs:  CBC Latest Ref Rng & Units 03/30/2021 03/29/2021 03/29/2021  WBC 4.0 - 10.5 K/uL 4.2 - 3.9(L)  Hemoglobin 12.0 - 15.0 g/dL 12.0 12.9 12.0  Hematocrit 36.0 - 46.0 % 38.3 38.0 37.0  Platelets 150 - 400 K/uL 163 - 156   CMP Latest Ref Rng & Units 03/30/2021 03/29/2021 03/29/2021   Glucose 70 - 99 mg/dL 103(H) 136(H) 131(H)  BUN 8 - 23 mg/dL 10 11 9   Creatinine 0.44 - 1.00 mg/dL 1.30(H) 1.28(H) 1.40(H)  Sodium 135 - 145 mmol/L 144 142 144  Potassium 3.5 - 5.1 mmol/L 3.7 3.2(L) 3.4(L)  Chloride 98 - 111 mmol/L 114(H) 109 107  CO2 22 - 32 mmol/L 21(L) 25 -  Calcium 8.9 - 10.3 mg/dL 9.6 9.3 -  Total Protein 6.5 - 8.1 g/dL - 6.0(L) -  Total Bilirubin 0.3 - 1.2 mg/dL - 0.3 -  Alkaline Phos 38 - 126 U/L - 57 -  AST 15 - 41 U/L - 20 -  ALT 0 - 44 U/L - 13 -       Micro Results Recent Results (from the past 240 hour(s))  Resp Panel by RT-PCR (Flu A&B, Covid) Nasopharyngeal Swab     Status: None   Collection Time: 03/29/21  9:57 AM   Specimen: Nasopharyngeal Swab; Nasopharyngeal(NP) swabs in vial transport medium  Result Value Ref Range Status   SARS Coronavirus 2 by RT PCR NEGATIVE NEGATIVE Final    Comment: (NOTE) SARS-CoV-2 target nucleic acids are NOT DETECTED.  The SARS-CoV-2 RNA is generally detectable in upper respiratory specimens during the acute phase of infection. The lowest concentration of SARS-CoV-2 viral copies this assay can detect is 138 copies/mL. A negative result does not preclude SARS-Cov-2 infection and should not be used as the sole basis for treatment or other patient management decisions. A negative result may occur with  improper specimen collection/handling, submission of specimen other than nasopharyngeal swab, presence of viral mutation(s) within the areas targeted by this assay, and inadequate number of viral copies(<138 copies/mL). A negative result must be combined with clinical observations, patient history, and epidemiological information. The expected result is Negative.  Fact Sheet for Patients:  EntrepreneurPulse.com.au  Fact Sheet for Healthcare Providers:  IncredibleEmployment.be  This test is no t yet approved or cleared by the [place] FDA and  has been authorized for  detection and/or diagnosis of SARS-CoV-2 by FDA under an Emergency Use Authorization (EUA). This EUA will remain  in effect (meaning this test can be used) for the duration of the COVID-19 declaration under Section 564(b)(1) of the Act, 21 U.S.C.section 360bbb-3(b)(1), unless the authorization is terminated  or revoked sooner.       Influenza A by PCR NEGATIVE NEGATIVE Final   Influenza B by PCR NEGATIVE NEGATIVE Final    Comment: (NOTE) The Xpert Xpress SARS-CoV-2/FLU/RSV plus assay is intended as an aid in the diagnosis of influenza  from Nasopharyngeal swab specimens and should not be used as a sole basis for treatment. Nasal washings and aspirates are unacceptable for Xpert Xpress SARS-CoV-2/FLU/RSV testing.  Fact Sheet for Patients: EntrepreneurPulse.com.au  Fact Sheet for Healthcare Providers: IncredibleEmployment.be  This test is not yet approved or cleared by the [place] FDA and has been authorized for detection and/or diagnosis of SARS-CoV-2 by FDA under an Emergency Use Authorization (EUA). This EUA will remain in effect (meaning this test can be used) for the duration of the COVID-19 declaration under Section 564(b)(1) of the Act, 21 U.S.C. section 360bbb-3(b)(1), unless the authorization is terminated or revoked.  Performed at [facility], [address], [place], [place] [address]     Radiology Reports CT Head Wo Contrast  Result Date: 03/29/2021 CLINICAL DATA:  Altered mental status EXAM: CT HEAD WITHOUT CONTRAST TECHNIQUE: Contiguous axial images were obtained from the base of the skull through the vertex without intravenous contrast. COMPARISON:  03/01/2021 FINDINGS: Brain: There is no significant dilation of ventricles. Cortical sulci are prominent. Calcifications are seen in the basal ganglia on both sides. There is no focal edema or mass effect. There is decreased density in the periventricular white matter. Vascular:  Unremarkable Skull: Unremarkable. Sinuses/Orbits: Unremarkable Other: No significant interval changes are noted. IMPRESSION: No acute intracranial findings are seen in noncontrast CT brain. No significant changes are noted since 03/01/2021. Electronically Signed   By: Elmer Picker M.D.   On: 03/29/2021 10:40   CT Head Wo Contrast  Result Date: 03/01/2021 CLINICAL DATA:  Mental status change EXAM: CT HEAD WITHOUT CONTRAST TECHNIQUE: Contiguous axial images were obtained from the base of the skull through the vertex without intravenous contrast. COMPARISON:  MRI 10/05/2020 FINDINGS: Brain: No acute territorial infarction, hemorrhage, or intracranial mass. Patchy white matter hypodensity consistent with chronic small vessel ischemic change. Stable ventricle size. Vascular: No hyperdense vessels.  Carotid vascular calcification. Skull: No fracture. Heterogenous sclerosis and lucency at the left skull base and clivus is chronic. Sinuses/Orbits: No acute finding. Other: None IMPRESSION: 1. No CT evidence for acute intracranial abnormality. 2. Mild chronic small vessel ischemic changes of the white matter Electronically Signed   By: Donavan Foil M.D.   On: 03/01/2021 21:55   DG Chest Portable 1 View  Result Date: 03/29/2021 CLINICAL DATA:  Altered mental status EXAM: PORTABLE CHEST 1 VIEW COMPARISON:  10/11/2020 FINDINGS: Heart and mediastinal contours are within normal limits. No focal opacities or effusions. No acute bony abnormality. IMPRESSION: No active disease. Electronically Signed   By: Rolm Baptise M.D.   On: 03/29/2021 09:53   EEG adult  Result Date: 03/30/2021 Lora Havens, MD     03/30/2021  8:51 AM Patient Name: SHARLYNN SECKINGER MRN: [id number] Epilepsy Attending: Lora Havens Referring Physician/Provider: Dr Shanon Brow Tat Date: 03/29/2021 Duration: 22.31 mins Patient history: 80 year old female with altered mental status.  EEG to evaluate for seizure. Level of alertness: Awake AEDs during  EEG study: TPM, clonazepam Technical aspects: This EEG study was done with scalp electrodes positioned according to the 10-20 International system of electrode placement. Electrical activity was acquired at a sampling rate of 500Hz  and reviewed with a high frequency filter of 70Hz  and a low frequency filter of 1Hz . EEG data were recorded continuously and digitally stored. Description: The posterior dominant rhythm consists of 8 Hz activity of moderate voltage (25-35 uV) seen predominantly in posterior head regions, symmetric and reactive to eye opening and eye closing.  Hyperventilation and photic stimulation were  not performed.  Patient was noted to have right upper extremity tremor-like movements on 03/29/2021 at 1722.  Concomitant EEG before, during and after the event did not show any EEG to suggest seizure. IMPRESSION: This study is within normal limits. No seizures or epileptiform discharges were seen throughout the recording. Patient was noted to have right upper extremity tremor-like movements on 03/29/2021 at 1722 without concomitant EEG change.  Focal motor seizures may not be seen on scalp EEG.  However, given the semiology of the episode this was most likely not an epileptic event. Lora Havens   ECHOCARDIOGRAM COMPLETE  Result Date: 03/29/2021    ECHOCARDIOGRAM REPORT   Patient Name:   TIYONNA SARDINHA Date of Exam: 03/29/2021 Medical Rec #:  [id number]       Height:       60.0 in Accession #:    [id number]      Weight:       175.0 lb Date of Birth:  Nov 12, 1940      BSA:          1.764 m Patient Age:    30 years        BP:           182/80 mmHg Patient Gender: F               HR:           70 bpm. Exam Location:  [facility] Procedure: 2D Echo, Cardiac Doppler and Color Doppler Indications:    Syncope R55  History:        Patient has no prior history of Echocardiogram examinations.                 Risk Factors:Hypertension. Psychosis ([place]).  Sonographer:    Alvino Chapel RCS Referring Phys: [phone]  DAVID TAT IMPRESSIONS  1. Left ventricular ejection fraction, by estimation, is 65 to 70%. The left ventricle has normal function. The left ventricle has no regional wall motion abnormalities. There is mild left ventricular hypertrophy. Left ventricular diastolic parameters are consistent with Grade I diastolic dysfunction (impaired relaxation). Elevated left atrial pressure.  2. Right ventricular systolic function is normal. The right ventricular size is normal. Tricuspid regurgitation signal is inadequate for assessing PA pressure.  3. The mitral valve is normal in structure. No evidence of mitral valve regurgitation. No evidence of mitral stenosis.  4. The aortic valve is tricuspid. Aortic valve regurgitation is not visualized. No aortic stenosis is present.  5. The inferior vena cava is normal in size with greater than 50% respiratory variability, suggesting right atrial pressure of 3 mmHg. FINDINGS  Left Ventricle: Left ventricular ejection fraction, by estimation, is 65 to 70%. The left ventricle has normal function. The left ventricle has no regional wall motion abnormalities. The left ventricular internal cavity size was normal in size. There is  mild left ventricular hypertrophy. Left ventricular diastolic parameters are consistent with Grade I diastolic dysfunction (impaired relaxation). Elevated left atrial pressure. Right Ventricle: The right ventricular size is normal. No increase in right ventricular wall thickness. Right ventricular systolic function is normal. Tricuspid regurgitation signal is inadequate for assessing PA pressure. Left Atrium: Left atrial size was normal in size. Right Atrium: Right atrial size was normal in size. Pericardium: There is no evidence of pericardial effusion. Mitral Valve: The mitral valve is normal in structure. No evidence of mitral valve regurgitation. No evidence of mitral valve stenosis. Tricuspid Valve: The tricuspid valve is normal in structure. Tricuspid valve  regurgitation is trivial. No evidence of tricuspid stenosis. Aortic Valve: The aortic valve is tricuspid. Aortic valve regurgitation is not visualized. No aortic stenosis is present. Aortic valve mean gradient measures 5.8 mmHg. Aortic valve peak gradient measures 10.7 mmHg. Aortic valve area, by VTI measures 2.05  cm. Pulmonic Valve: The pulmonic valve was not well visualized. Pulmonic valve regurgitation is not visualized. No evidence of pulmonic stenosis. Aorta: The aortic root is normal in size and structure. Venous: The inferior vena cava is normal in size with greater than 50% respiratory variability, suggesting right atrial pressure of 3 mmHg. IAS/Shunts: No atrial level shunt detected by color flow Doppler.  LEFT VENTRICLE PLAX 2D LVIDd:         4.30 cm   Diastology LVIDs:         2.40 cm   LV e' medial:    5.66 cm/s LV PW:         1.20 cm   LV E/e' medial:  14.5 LV IVS:        1.20 cm   LV e' lateral:   5.66 cm/s LVOT diam:     1.90 cm   LV E/e' lateral: 14.5 LV SV:         75 LV SV Index:   42 LVOT Area:     2.84 cm  RIGHT VENTRICLE RV S prime:     14.90 cm/s TAPSE (M-mode): 2.9 cm LEFT ATRIUM             Index        RIGHT ATRIUM           Index LA diam:        3.50 cm 1.98 cm/m   RA Area:     14.90 cm LA Vol (A2C):   55.0 ml 31.19 ml/m  RA Volume:   35.50 ml  20.13 ml/m LA Vol (A4C):   50.0 ml 28.35 ml/m LA Biplane Vol: 52.4 ml 29.71 ml/m  AORTIC VALVE AV Area (Vmax):    1.75 cm AV Area (Vmean):   1.76 cm AV Area (VTI):     2.05 cm AV Vmax:           163.50 cm/s AV Vmean:          116.133 cm/s AV VTI:            0.364 m AV Peak Grad:      10.7 mmHg AV Mean Grad:      5.8 mmHg LVOT Vmax:         101.00 cm/s LVOT Vmean:        72.200 cm/s LVOT VTI:          0.263 m LVOT/AV VTI ratio: 0.72  AORTA Ao Root diam: 3.00 cm MITRAL VALVE MV Area (PHT): 3.60 cm     SHUNTS MV Decel Time: 211 msec     Systemic VTI:  0.26 m MV E velocity: 81.80 cm/s   Systemic Diam: 1.90 cm MV A velocity: 108.00 cm/s MV  E/A ratio:  0.76 Carlyle Dolly MD Electronically signed by Carlyle Dolly MD Signature Date/Time: 03/29/2021/4:05:42 PM    Final     SIGNED: Deatra James, MD, FHM. [facility],  Pager (please use [email or web address] to page/text) Please use Epic Secure Chat for non-urgent communication (7AM-7PM)  If 7PM-7AM, please contact night-coverage [email or web address], 03/30/2021, 10:18 AM

## 2021-03-30 NOTE — Procedures (Signed)
Patient Name: Allison Watson  MRN: [id number]  Epilepsy Attending: Lora Havens  Referring Physician/Provider: Dr Shanon Brow Tat Date: 03/29/2021 Duration: 22.31 mins  Patient history: 80 year old female with altered mental status.  EEG to evaluate for seizure.  Level of alertness: Awake  AEDs during EEG study: TPM, clonazepam  Technical aspects: This EEG study was done with scalp electrodes positioned according to the 10-20 International system of electrode placement. Electrical activity was acquired at a sampling rate of 500Hz  and reviewed with a high frequency filter of 70Hz  and a low frequency filter of 1Hz . EEG data were recorded continuously and digitally stored.   Description: The posterior dominant rhythm consists of 8 Hz activity of moderate voltage (25-35 uV) seen predominantly in posterior head regions, symmetric and reactive to eye opening and eye closing.  Hyperventilation and photic stimulation were not performed.    Patient was noted to have right upper extremity tremor-like movements on 03/29/2021 at 1722.  Concomitant EEG before, during and after the event did not show any EEG to suggest seizure.  IMPRESSION: This study is within normal limits. No seizures or epileptiform discharges were seen throughout the recording.   Patient was noted to have right upper extremity tremor-like movements on 03/29/2021 at 1722 without concomitant EEG change.  Focal motor seizures may not be seen on scalp EEG.  However, given the semiology of the episode this was most likely not an epileptic event.  Verdis Koval Barbra Sarks

## 2021-03-30 NOTE — Progress Notes (Signed)
This nurse asked patient if she gets claustrophobic in small areas and she said yes. I asked twice to confirm so she could be prepared for her MRI. Patient wouldn't open mouth wide enough for po valium. So I asked Neurologist to switch to IV.

## 2021-03-30 NOTE — Consult Note (Signed)
I connected with  Allison Watson on 03/30/21 by a video enabled telemedicine application and verified that I am speaking with the correct person using two identifiers.   I discussed the limitations of evaluation and management by telemedicine. The patient expressed understanding and agreed to proceed.  Location of patient: [facility] Location of physician: [facility]  Neurology Consultation Reason for Consult: ams Referring Physician: Dr Skipper Cliche  CC: ams  History is obtained from: Patient's daughter, chart review  HPI: Allison Watson is a 80 y.o. female with past medical history of hypertension, chronic kidney disease stage III, anemia due to CKD, arthritis due to gout presenting with transient alteration of awareness.  Patient is currently drowsy, she wakes up and answers few orientation questions but is not able to provide any history.  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 Daughter states patient has had significant trouble with maintaining her ADLs and needs help with medications, sometimes even with basic hygiene including urination more recently. She is also physically weak.  [place] were at the bank where patient became confused, was repeating and then eventually lost consciousness.  Patient was brought to [facility] ED where she gradually started waking up but continues to be excessively drowsy.  Of note, on arrival patient's blood pressure was 198/70.  Per daughter, patient may not have been compliant with her medications but for the last few days family has been trying to help her with her medications  Per review of chart, patient was seen by neurology on 10/06/2020 for visual hallucinations during which MRI brain showed abnormal focal sulcal FLAIR hyperintensity and pial enhancement involving the  posterior right cerebrum, including the right parietal, occipital and posterior temporal lobes. Findings are nonspecific with differential considerations including meningitis/encephalitis, postictal hyperemia, neurosarcoidosis, vasculitis, and leptomeningeal carcinomatosis.  Lumbar puncture was done which did not show any evidence of CSF pleocytosis, CSF ACE was normal, no malignant cells were seen on cytology.  Patient was started on olanzapine with improvement in hallucinations.  Patient was seen again by neurology on 10/12/2020 for ongoing encephalopathy. Video EEG was performed which showed cortical dysfunction arising from right temporo-parietal region,  nonspecific but likely secondary to underlying structural abnormality. No seizures or epileptiform discharges were seen throughout the recording.  Plan was to add Depakote but I do not see that being started.  Patient daughter denies any other recent illnesses, medication changes.  ROS: All other systems reviewed and negative except as noted in the HPI.  Past Medical History:  Diagnosis Date   Arthritis    Lt arm   Gout    Hypertension    Family history: Denies family history of epilepsy  Social History:  reports that she has quit smoking. She has never used smokeless tobacco. She reports that she does not drink alcohol and does not use drugs.   Medications Prior to Admission  Medication Sig Dispense Refill Last Dose   albuterol (VENTOLIN HFA) 108 (90 Base) MCG/ACT inhaler Inhale 1-2 puffs into the lungs every 6 (six) hours as needed for wheezing or shortness of breath. Will use when too hot/ too cold outside      cholecalciferol (VITAMIN D3) 25 MCG (1000 UNIT) tablet Take 1,000 Units by mouth daily.      clonazePAM (KLONOPIN) 1 MG tablet Take 1 mg by mouth 2 (two)  times daily as needed.   Past Week   cyanocobalamin (,VITAMIN B-12,) 1000 MCG/ML injection Inject 1,000 mcg into the skin every 30 (thirty) days.   Past Month    hydrALAZINE (APRESOLINE) 100 MG tablet Take 1 tablet (100 mg total) by mouth 2 (two) times daily. 60 tablet 5 Past Week   labetalol (NORMODYNE) 100 MG tablet Take 1 tablet (100 mg total) by mouth 2 (two) times daily. 60 tablet 5 Past Week at unk   lisinopril-hydrochlorothiazide (ZESTORETIC) 20-12.5 MG tablet Take 1 tablet by mouth daily.   Past Week   LORazepam (ATIVAN) 1 MG tablet Take 1 mg by mouth daily as needed.   Past Week   OLANZapine (ZYPREXA) 10 MG tablet Take 1 tablet (10 mg total) by mouth at bedtime. 30 tablet 4 Past Week   oxyCODONE (OXY IR/ROXICODONE) 5 MG immediate release tablet Take 5 mg by mouth 2 (two) times daily as needed.   Past Week   topiramate (TOPAMAX) 100 MG tablet Take 1 tablet (100 mg total) by mouth 2 (two) times daily. 60 tablet 3 Past Week   diltiazem (CARDIZEM CD) 240 MG 24 hr capsule Take 240 mg by mouth daily. (Patient not taking: Reported on 03/29/2021)   Not Taking   doxycycline (VIBRAMYCIN) 100 MG capsule Take 100 mg by mouth 2 (two) times daily. (Patient not taking: Reported on 03/29/2021)   Not Taking   naproxen (NAPROSYN) 500 MG tablet Take 500 mg by mouth daily as needed (pain in arm). (Patient not taking: Reported on 03/29/2021)   Not Taking      Exam: Current vital signs: BP (!) 175/90 (BP Location: Right Arm)   Pulse 70   Temp 98.7 F (37.1 C) (Oral)   Resp 16   Ht 5' (1.524 m)   Wt 79.4 kg   SpO2 98%   BMI 34.18 kg/m  Vital signs in last 24 hours: Temp:  [97.8 F (36.6 C)-98.7 F (37.1 C)] 98.7 F (37.1 C) (12/07 0457) Pulse Rate:  [61-72] 70 (12/07 0457) Resp:  [15-27] 16 (12/07 0457) BP: (159-198)/(65-90) 175/90 (12/07 0457) SpO2:  [95 %-100 %] 98 % (12/07 0457)   Physical Exam  Constitutional: Appears well-developed and well-nourished.  Psych: Drowsy but does wake up and respond to questions  Eyes: No scleral injection Respiratory: Effort normal, non-labored breathing Neuro: Drowsy but does wake up to verbal stimulation, able to  tell me her name, thinks she is at home, month December, follows simple one-step commands, PERRLA, EOMI, no apparent facial asymmetry, antigravity strength in upper extremities without drift, resting tremor in bilateral upper extremities which improves with action as well as jaw tremor  I have reviewed labs in epic and the results pertinent to this consultation are: CBC:  Recent Labs  Lab 03/29/21 0937 03/30/21 0514  WBC 3.9* 4.2  NEUTROABS 1.6*  --   HGB 12.9  12.0 12.0  HCT 38.0  37.0 38.3  MCV 82.6 82.0  PLT 156 546    Basic Metabolic Panel:  Lab Results  Component Value Date   NA 144 03/30/2021   K 3.7 03/30/2021   CO2 21 (L) 03/30/2021   GLUCOSE 103 (H) 03/30/2021   BUN 10 03/30/2021   CREATININE 1.30 (H) 03/30/2021   CALCIUM 9.6 03/30/2021   GFRNONAA 42 (L) 03/30/2021   GFRAA 41 (L) 07/21/2019   Lipid Panel:  Lab Results  Component Value Date   LDLCALC 107 (H) 11/02/2006   HgbA1c: No results found for: HGBA1C Urine Drug  Screen:     Component Value Date/Time   LABOPIA NONE DETECTED 03/29/2021 1248   COCAINSCRNUR NONE DETECTED 03/29/2021 1248   LABBENZ NONE DETECTED 03/29/2021 1248   AMPHETMU NONE DETECTED 03/29/2021 1248   THCU NONE DETECTED 03/29/2021 1248   LABBARB NONE DETECTED 03/29/2021 1248    Alcohol Level     Component Value Date/Time   ETH <10 03/29/2021 9371     I have reviewed the images obtained:  CT head without contrast 03/29/2021: No acute abnormality.  ASSESSMENT/PLAN: 80 year old female presented with altered mental status.  Acute encephalopathy AKI Folate deficiency -No evidence of infection.  Normal electrolytes.  Normal TSH, ammonia, B12 with improving -Patient's presentation is most likely multifactorial :medication related, folate deficiency, seizures, dementia  Recommendations: -We will start folate replacement at 2 mg daily -Patient is on Topamax which can worsen mental status and her speech disturbance at times.  This  was mainly used for headaches.  Plan was to start on Depakote during previous admission but I do not see it being started.  -Her current EEG is within normal limits.  Therefore, I will switch her from Topamax to Depakote 250 mg twice daily as it would help with headache and possible seizures -We will also obtain MRI brain with and without contrast to look for any acute abnormalities as well as resolution of previous MRI changes -We will check orthostatic vital signs, optimize antihypertensives with goal normotension -Depending on MRI finding and patient's mental status tomorrow, will consider transfer to [facility] for LTM EEG. -Continue seizure precautions -Management of rest of comorbidities per primary team -Plan was discussed with patient's daughter as well as Dr. Roger Shelter  Thank you for allowing [place] to participate in the care of this patient. If you have any further questions, please contact  me or neurohospitalist.   [facility]

## 2021-03-31 ENCOUNTER — Ambulatory Visit: Payer: Medicare HMO

## 2021-03-31 ENCOUNTER — Other Ambulatory Visit: Payer: Self-pay | Admitting: Family Medicine

## 2021-03-31 DIAGNOSIS — G9341 Metabolic encephalopathy: Secondary | ICD-10-CM | POA: Diagnosis not present

## 2021-03-31 DIAGNOSIS — R55 Syncope and collapse: Secondary | ICD-10-CM

## 2021-03-31 DIAGNOSIS — G9340 Encephalopathy, unspecified: Secondary | ICD-10-CM | POA: Diagnosis not present

## 2021-03-31 MED ORDER — DIVALPROEX SODIUM 250 MG PO DR TAB: 250.0000 mg | DELAYED_RELEASE_TABLET | Freq: Two times a day (BID) | ORAL | 0 refills | Status: DC

## 2021-03-31 MED ORDER — ASPIRIN EC 81 MG PO TBEC: 81.0000 mg | DELAYED_RELEASE_TABLET | Freq: Every day | ORAL | 2 refills | Status: AC

## 2021-03-31 MED ORDER — FOLIC ACID 1 MG PO TABS: 2.0000 mg | ORAL_TABLET | Freq: Every day | ORAL | 0 refills | Status: AC

## 2021-03-31 NOTE — Progress Notes (Signed)
I connected with  Allison Watson on 03/31/21 by a video enabled telemedicine application and verified that I am speaking with the correct person using two identifiers.   I discussed the limitations of evaluation and management by telemedicine. The patient expressed understanding and agreed to proceed.  Location of patient: [facility] Location of physician: [facility]  Subjective: Patient is more awake, following commands this morning.  Daughter at bedside.  ROS: negative except above  Examination  Vital signs in last 24 hours: Temp:  [98.2 F (36.8 C)] 98.2 F (36.8 C) (12/08 0524) Pulse Rate:  [61-70] 61 (12/08 0524) Resp:  [15-20] 19 (12/08 0524) BP: (158-190)/(68-70) 190/68 (12/08 0524) SpO2:  [96 %-99 %] 99 % (12/08 0524)  General: Sitting in bed, not in apparent distress Psych: Affect appropriate to situation RS: breathing comfortably Neuro: Awake, alert, oriented to self and place but not to time, able to follow simple one-step commands, no evidence of aphasia or dysarthria, poor attention span, cranial nerves II 12 grossly intact, antigravity strength without drift in all 4 extremities, patient had subtle right upper extremity tremor which was difficult to visualize on camera but was present by RN.  Patient also had jaw tremor.  Basic Metabolic Panel: Recent Labs  Lab 03/29/21 0937 03/30/21 0514  NA 142  144 144  K 3.2*  3.4* 3.7  CL 109  107 114*  CO2 25 21*  GLUCOSE 136*  131* 103*  BUN 11  9 10   CREATININE 1.28*  1.40* 1.30*  CALCIUM 9.3 9.6  MG 1.6* 2.1    CBC: Recent Labs  Lab 03/29/21 0937 03/30/21 0514  WBC 3.9* 4.2  NEUTROABS 1.6*  --   HGB 12.9  12.0 12.0  HCT 38.0  37.0 38.3  MCV 82.6 82.0  PLT 156 163     Coagulation Studies: No results for input(s): LABPROT, INR in the last 72 hours.  Imaging MRI brain without contrast/7/22: No acute abnormality.  Study was limited by motion artifact.  ASSESSMENT AND PLAN:  80 year old female presented with altered mental status.   Acute encephalopathy (improving) AKI Folate deficiency Orthostatic hypotension Hypertensive urgency Tremor -No evidence of infection.  Normal electrolytes.  Normal TSH, ammonia, B12 improving -Patient's presentation is most likely multifactorial :medication related, folate deficiency, seizures, dementia   Recommendations: -Continue folate replacement at 2 mg daily -Patient is on Topamax which can worsen mental status and her speech disturbance at times.  This was mainly used for headaches.  Plan was to start on Depakote during previous admission but I do not see it being started.  -Her current EEG is within normal limits.  Therefore, I will switch her from Topamax to Depakote 250 mg twice daily as it would help with headache and possible seizures -Patient had orthostatic hypotension as well as hypertensive urgency with systolics between 622-297.  Optimize antihypertensives with goal normotension -Continue seizure precautions -Management of rest of comorbidities per primary team -Plan was discussed with patient's daughter as well as Dr. Roger Shelter -Recommend follow-up with Dr. Merlene Laughter.  Patient will likely need further dementia evaluation which cannot be performed during current inpatient evaluation -Patient also had developed right upper extremity and jaw tremors which were more visible when patient was confused.  No evidence of rigidity per RN examination.  Unfortunately due to limitation of tele-neurology I was unable to assess for rigidity and tone.  I would recommend follow-up with Dr. Merlene Laughter in clinic for further evaluation of tremor. -This plan was discussed  in detail with patient's daughter at bedside.  Daughter appropriately concerned about worsening of mother's mental and physical status.  I tried answering her questions to the best of my ability and encouraged her to follow-up with Dr. Merlene Laughter in clinic  Thank you for  allowing [place] to participate in the care of this patient. If you have any further questions, please contact  me or neurohospitalist.   I have spent a total of  28  minutes with the patient reviewing hospital notes,  test results, labs and examining the patient as well as establishing an assessment and plan that was discussed personally with the patient and daughter at bedside.  > 50% of time was spent in direct patient care.  [facility] For questions after 5pm please refer to AMION to reach the Neurologist on call

## 2021-03-31 NOTE — Discharge Summary (Signed)
Physician Discharge Summary [facility]    Patient: Allison Watson                   Admit date: 03/29/2021   DOB: 1940-07-10             Discharge date:03/31/2021/12:38 PM JOA:[id number]                          PCP: Celene Squibb, MD  Disposition:   HOME with Home Health   Recommendations for Outpatient Follow-up:   Follow up: Neurologist in 2 weeks Follow with cardiologist in 1 to 2 weeks, for results of Holter monitor (to rule out dysrhythmia) Follow with PCP within 1 week, current medication need to be adjusted for better BP control  Discharge Condition: Stable   Code Status:   Code Status: Full Code  Diet recommendation: Cardiac diet   Discharge Diagnoses:    Principal Problem:   Acute metabolic encephalopathy Active Problems:   Essential hypertension   Obesity (BMI 30-39.9)   Hypomagnesemia   History of Present Illness/ Hospital Course Allison Watson Summary:   Allison Watson is a 80 y.o. female with medical history of CKD stage III, hypertension, anemia of CKD, gouty arthritis presenting with altered mental status. Apparently the patient was at the bank with her daughter at the [place].  They were at the bank the patient bills.  The patient became confused and kept repeating " just give me a minute" on multiple occasions.  Subsequently, the patient closed her eyes and laid her head back onto the headrest.  Shortly thereafter, the patient slumped over unresponsive onto her daughter who was driving.  Her daughter immediately brought her to the emergency department where emergency personnel got the patient out of the car.  The patient's daughter states that by the time the patient got into the room she began waking up.  The entire episode lasted about 20 minutes according to the daughter's recollection.  There was no tonic-clonic activity.  The patient has not had any fevers, chills, headache, chest pain, shortness breath, cough, hemoptysis, nausea, vomiting,  diarrhea.  The patient had been in her usual state of health.  She has not started on any new medications.  She does not take any over-the-counter medications that are new. When EDP initially evaluated the patient, the patient was still a bit lethargic but awake.  As the patient's course progressed in the emergency department, she became more awake and conversant albeit a little confused. At the time of my evaluation, the patient is alert and oriented x2.  She is unaware of the date or year.  However she states that she knew that she was at the bank paying some bills, and she realized she got confused.   Notably, the patient had a hospital admission from 10/05/2020 to 10/09/2020 due to hallucinations.  She had an extensive work-up including an LP which was unremarkable for infection.  Cytology was negative for any malignant cells.  She was found to be B12 deficient and this was repleted.  At the time of discharge, the patient was back to baseline.  However she was readmitted to the hospital the next day from 10/10/2020 to 10/16/2020.  She was transferred to [facility] because of concerns for seizure.  She underwent EEG 24-hour monitoring which did not show any seizure.  She had another EEG 12-hour monitoring episode Which did not show any seizure.  Her mental status improved  and the patient was subsequently discharged back home.  It appears the patient has been on olanzapine since that time.   In the emergency department, the patient was afebrile hemodynamically stable with oxygen saturation 100% room air.  BMP shows sodium 142, potassium 3.2, serum creatinine 1.28.  Magnesium was 1.6.  LFTs were unremarkable.  WBC 3.9, hemoglobin 12.9, platelets 1-56,000.  CT of the brain was negative for acute findings.  Chest x-ray was negative.  EKG showed prolonged QTC with sinus rhythm.     Discharge summary      Acute encephalopathy ?  Syncope vs seizures -Suspicious for possible seizure -neurology consulted for  further evaluation -Mental status continues to improve throughout her ED course --- back to baseline -EEG -reviewed by neurology, within normal limits  -previous records reviewed  -Ammonia 13 -UA negative for pyuria -B12,  TSH, within normal limits  -Neurology discontinue Topamax, started Depakote 250 mg daily  -Echo: Fraction 65-70%, negative for any abnormality, calcified aortic valve negative for any stenosis -Discussed with cardiology, due to multiple episodes of syncope, altered mental status, patient will benefit from Holter monitor placement... Will be arranged by cardiology-and follow-up as an outpatient   -MRI of the brain -revealed no acute abnormality    Low folate level Was repleted with 2 g IV 10 p.o.  Asked cardiology for Holter monitor placement, follow-up as an outpatient if any further episodes may be consistent with syncope...    Tardive dyskinesia -The patient has some extraparametal movement movements on exam -Patient's Topamax was discontinued, was started on Depakote 250 mg p.o. daily   Essential hypertension -On admission was hypotensive now hypertensive again -May resume home medication of labetalol and hydralazine    CKD stage IIIb -Baseline creatinine 1.3-1.6 -Creatinine 1.30 -Monitor clinically   Hypokalemia/Hypomagnesemia -repleted   Obesity -BMI 34.18 -lifestyle modification     Code Status:   FULL Family Communication:  daughter updated 12/6 Disposition Plan: expect        Discharge Instructions:   Discharge Instructions     Activity as tolerated - No restrictions   Complete by: As directed    Activity as tolerated - No restrictions   Complete by: As directed    Call MD for:  extreme fatigue   Complete by: As directed    Call MD for:  persistant dizziness or light-headedness   Complete by: As directed    Diet - low sodium heart healthy   Complete by: As directed    Discharge instructions   Complete by: As directed     Follow-up with cardiologist in 2 to 3 weeks, with result of Holter monitor and findings regarding possible syncope. Follow-up with neurologist in 2 weeks... Medication subjective change by neurologist Follow-up with her PCP in 2 to 3 weeks   Discharge instructions   Complete by: As directed    Follow-up with cardiologist and neurologist within 2  weeks   Increase activity slowly   Complete by: As directed    Increase activity slowly   Complete by: As directed         Medication List     STOP taking these medications    clonazePAM 1 MG tablet Commonly known as: KLONOPIN   diltiazem 240 MG 24 hr capsule Commonly known as: CARDIZEM CD   doxycycline 100 MG capsule Commonly known as: VIBRAMYCIN   LORazepam 1 MG tablet Commonly known as: ATIVAN   naproxen 500 MG tablet Commonly known as: NAPROSYN   oxyCODONE 5 MG  immediate release tablet Commonly known as: Oxy IR/ROXICODONE   topiramate 100 MG tablet Commonly known as: TOPAMAX       TAKE these medications    albuterol 108 (90 Base) MCG/ACT inhaler Commonly known as: VENTOLIN HFA Inhale 1-2 puffs into the lungs every 6 (six) hours as needed for wheezing or shortness of breath. Will use when too hot/ too cold outside   aspirin EC 81 MG tablet Take 1 tablet (81 mg total) by mouth daily. Swallow whole.   cholecalciferol 25 MCG (1000 UNIT) tablet Commonly known as: VITAMIN D3 Take 1,000 Units by mouth daily.   cyanocobalamin 1000 MCG/ML injection Commonly known as: (VITAMIN B-12) Inject 1,000 mcg into the skin every 30 (thirty) days.   divalproex 250 MG DR tablet Commonly known as: DEPAKOTE Take 1 tablet (250 mg total) by mouth every 12 (twelve) hours.   folic acid 1 MG tablet Commonly known as: FOLVITE Take 2 tablets (2 mg total) by mouth daily. Start taking on: April 01, 2021   hydrALAZINE 100 MG tablet Commonly known as: APRESOLINE Take 1 tablet (100 mg total) by mouth 2 (two) times daily.    labetalol 100 MG tablet Commonly known as: NORMODYNE Take 1 tablet (100 mg total) by mouth 2 (two) times daily.   lisinopril-hydrochlorothiazide 20-12.5 MG tablet Commonly known as: ZESTORETIC Take 1 tablet by mouth daily.   OLANZapine 10 MG tablet Commonly known as: ZYPREXA Take 1 tablet (10 mg total) by mouth at bedtime.        Allergies  Allergen Reactions   Codeine Rash   Lorazepam Other (See Comments)    Hallucinations per daughter [name]   Penicillins Rash    Has patient had a PCN reaction causing immediate rash, facial/tongue/throat swelling, SOB or lightheadedness with hypotension: Yes Has patient had a PCN reaction causing severe rash involving mucus membranes or skin necrosis: No Has patient had a PCN reaction that required hospitalization No Has patient had a PCN reaction occurring within the last 10 years: No If all of the above answers are "NO", then may proceed with Cephalosporin use.      Quit smoking:  It is highly recommended that all people especially deals with diabetes to quit smoking or stay away from smoking, avoid secondhand smoking.   Vaccines:  Also highly recommended update your vaccines requirement, including SARS-CoV-2 , yearly  flu vaccine and pneumonia vaccine at least every 5 years.      Exercise: If you are able: 30 -60 minutes a day, 4 days a week, or 150 minutes a week.  The longer the better.  Combine stretch, strength, and aerobic activities.  If you were told in the past that you have high risk for cardiovascular diseases, you may seek evaluation by your heart doctor prior to initiating moderate to intense exercise programs.    One other important lifestyle recommendation is to ensure adequate sleep - at least 6-7 hours of uninterrupted sleep at night.  Procedures /Studies:   CT Head Wo Contrast  Result Date: 03/29/2021 CLINICAL DATA:  Altered mental status EXAM: CT HEAD WITHOUT CONTRAST TECHNIQUE: Contiguous axial images were  obtained from the base of the skull through the vertex without intravenous contrast. COMPARISON:  03/01/2021 FINDINGS: Brain: There is no significant dilation of ventricles. Cortical sulci are prominent. Calcifications are seen in the basal ganglia on both sides. There is no focal edema or mass effect. There is decreased density in the periventricular white matter. Vascular: Unremarkable Skull: Unremarkable. Sinuses/Orbits: Unremarkable Other:  No significant interval changes are noted. IMPRESSION: No acute intracranial findings are seen in noncontrast CT brain. No significant changes are noted since 03/01/2021. Electronically Signed   By: Elmer Picker M.D.   On: 03/29/2021 10:40   CT Head Wo Contrast  Result Date: 03/01/2021 CLINICAL DATA:  Mental status change EXAM: CT HEAD WITHOUT CONTRAST TECHNIQUE: Contiguous axial images were obtained from the base of the skull through the vertex without intravenous contrast. COMPARISON:  MRI 10/05/2020 FINDINGS: Brain: No acute territorial infarction, hemorrhage, or intracranial mass. Patchy white matter hypodensity consistent with chronic small vessel ischemic change. Stable ventricle size. Vascular: No hyperdense vessels.  Carotid vascular calcification. Skull: No fracture. Heterogenous sclerosis and lucency at the left skull base and clivus is chronic. Sinuses/Orbits: No acute finding. Other: None IMPRESSION: 1. No CT evidence for acute intracranial abnormality. 2. Mild chronic small vessel ischemic changes of the white matter Electronically Signed   By: Donavan Foil M.D.   On: 03/01/2021 21:55   MR BRAIN WO CONTRAST  Result Date: 03/30/2021 CLINICAL DATA:  Seizure, new-onset, no history of trauma; mental status change, persistent or worsening EXAM: MRI HEAD WITHOUT CONTRAST TECHNIQUE: Multiplanar, multiecho pulse sequences of the brain and surrounding structures were obtained without intravenous contrast. COMPARISON:  June 2022 FINDINGS: DWI, GRE, and axial  T2 sequences were obtained. Patient could not complete remainder of the study. Artifact obscures much of the posterior fossa on DWI. There is even greater obscuration on GRE. Brain: Within above limitation, there is no evidence of acute infarction or evidence of hemorrhage. There is no intracranial mass, mass effect, or edema. There is no hydrocephalus or extra-axial fluid collection. Prominence of the ventricles and sulci reflects parenchymal volume loss. Patchy foci of T2 hyperintensity in the supratentorial white matter are nonspecific but may reflect chronic microvascular ischemic changes. Vascular: Major vessel flow voids at the skull base are preserved. Skull and upper cervical spine: Normal marrow signal is preserved. Sinuses/Orbits: Mild mucosal thickening.  Orbits are unremarkable. Other: Sella is unremarkable.  Mastoid air cells are clear. IMPRESSION: No acute infarction or other acute abnormality within limitation of artifact. Electronically Signed   By: Macy Mis M.D.   On: 03/30/2021 15:32   US Carotid Bilateral  Result Date: 03/30/2021 CLINICAL DATA:  TIA symptoms, hypertension EXAM: BILATERAL CAROTID DUPLEX ULTRASOUND TECHNIQUE: Pearline Cables scale imaging, color Doppler and duplex ultrasound were performed of bilateral carotid and vertebral arteries in the neck. COMPARISON:  None. FINDINGS: Criteria: Quantification of carotid stenosis is based on velocity parameters that correlate the residual internal carotid diameter with NASCET-based stenosis levels, using the diameter of the distal internal carotid lumen as the denominator for stenosis measurement. The following velocity measurements were obtained: RIGHT ICA: 104/47 cm/sec CCA: 287/86 cm/sec SYSTOLIC ICA/CCA RATIO:  1.5 ECA: 77 cm/sec LEFT ICA: 89/25 cm/sec CCA: 767/20 cm/sec SYSTOLIC ICA/CCA RATIO:  1.6 ECA: 80 cm/sec RIGHT CAROTID ARTERY: Mild to moderate heterogeneous partially calcified bifurcation atherosclerosis. Despite this, no  significant right ICA stenosis, velocity elevation, turbulent flow. Degree of narrowing less than 50% by ultrasound criteria. RIGHT VERTEBRAL ARTERY:  Normal antegrade flow LEFT CAROTID ARTERY: Similar scattered minor echogenic plaque formation. No hemodynamically significant left ICA stenosis, velocity elevation, or turbulent flow. LEFT VERTEBRAL ARTERY:  Normal antegrade flow IMPRESSION: Bilateral carotid atherosclerosis. Negative for stenosis. Degree of narrowing less than 50% bilaterally by ultrasound criteria. Patent antegrade vertebral flow bilaterally Electronically Signed   By: Jerilynn Mages.  Shick M.D.   On: 03/30/2021 16:34   DG  Chest Portable 1 View  Result Date: 03/29/2021 CLINICAL DATA:  Altered mental status EXAM: PORTABLE CHEST 1 VIEW COMPARISON:  10/11/2020 FINDINGS: Heart and mediastinal contours are within normal limits. No focal opacities or effusions. No acute bony abnormality. IMPRESSION: No active disease. Electronically Signed   By: Rolm Baptise M.D.   On: 03/29/2021 09:53   EEG adult  Result Date: 03/30/2021 Lora Havens, MD     03/30/2021  8:51 AM Patient Name: Allison Watson MRN: [id number] Epilepsy Attending: Lora Havens Referring Physician/Provider: Dr Shanon Brow Tat Date: 03/29/2021 Duration: 22.31 mins Patient history: 80 year old female with altered mental status.  EEG to evaluate for seizure. Level of alertness: Awake AEDs during EEG study: TPM, clonazepam Technical aspects: This EEG study was done with scalp electrodes positioned according to the 10-20 International system of electrode placement. Electrical activity was acquired at a sampling rate of 500Hz  and reviewed with a high frequency filter of 70Hz  and a low frequency filter of 1Hz . EEG data were recorded continuously and digitally stored. Description: The posterior dominant rhythm consists of 8 Hz activity of moderate voltage (25-35 uV) seen predominantly in posterior head regions, symmetric and reactive to eye opening and  eye closing.  Hyperventilation and photic stimulation were not performed.  Patient was noted to have right upper extremity tremor-like movements on 03/29/2021 at 1722.  Concomitant EEG before, during and after the event did not show any EEG to suggest seizure. IMPRESSION: This study is within normal limits. No seizures or epileptiform discharges were seen throughout the recording. Patient was noted to have right upper extremity tremor-like movements on 03/29/2021 at 1722 without concomitant EEG change.  Focal motor seizures may not be seen on scalp EEG.  However, given the semiology of the episode this was most likely not an epileptic event. Lora Havens   ECHOCARDIOGRAM COMPLETE  Result Date: 03/29/2021    ECHOCARDIOGRAM REPORT   Patient Name:   Allison Watson Date of Exam: 03/29/2021 Medical Rec #:  [id number]       Height:       60.0 in Accession #:    [id number]      Weight:       175.0 lb Date of Birth:  [birth year]      BSA:          1.764 m Patient Age:    35 years        BP:           182/80 mmHg Patient Gender: F               HR:           70 bpm. Exam Location:  [facility] Procedure: 2D Echo, Cardiac Doppler and Color Doppler Indications:    Syncope R55  History:        Patient has no prior history of Echocardiogram examinations.                 Risk Factors:Hypertension. Psychosis ([place]).  Sonographer:    Alvino Chapel RCS Referring Phys: [phone] DAVID TAT IMPRESSIONS  1. Left ventricular ejection fraction, by estimation, is 65 to 70%. The left ventricle has normal function. The left ventricle has no regional wall motion abnormalities. There is mild left ventricular hypertrophy. Left ventricular diastolic parameters are consistent with Grade I diastolic dysfunction (impaired relaxation). Elevated left atrial pressure.  2. Right ventricular systolic function is normal. The right ventricular size is normal. Tricuspid regurgitation signal is inadequate for assessing  PA pressure.  3. The mitral valve is  normal in structure. No evidence of mitral valve regurgitation. No evidence of mitral stenosis.  4. The aortic valve is tricuspid. Aortic valve regurgitation is not visualized. No aortic stenosis is present.  5. The inferior vena cava is normal in size with greater than 50% respiratory variability, suggesting right atrial pressure of 3 mmHg. FINDINGS  Left Ventricle: Left ventricular ejection fraction, by estimation, is 65 to 70%. The left ventricle has normal function. The left ventricle has no regional wall motion abnormalities. The left ventricular internal cavity size was normal in size. There is  mild left ventricular hypertrophy. Left ventricular diastolic parameters are consistent with Grade I diastolic dysfunction (impaired relaxation). Elevated left atrial pressure. Right Ventricle: The right ventricular size is normal. No increase in right ventricular wall thickness. Right ventricular systolic function is normal. Tricuspid regurgitation signal is inadequate for assessing PA pressure. Left Atrium: Left atrial size was normal in size. Right Atrium: Right atrial size was normal in size. Pericardium: There is no evidence of pericardial effusion. Mitral Valve: The mitral valve is normal in structure. No evidence of mitral valve regurgitation. No evidence of mitral valve stenosis. Tricuspid Valve: The tricuspid valve is normal in structure. Tricuspid valve regurgitation is trivial. No evidence of tricuspid stenosis. Aortic Valve: The aortic valve is tricuspid. Aortic valve regurgitation is not visualized. No aortic stenosis is present. Aortic valve mean gradient measures 5.8 mmHg. Aortic valve peak gradient measures 10.7 mmHg. Aortic valve area, by VTI measures 2.05  cm. Pulmonic Valve: The pulmonic valve was not well visualized. Pulmonic valve regurgitation is not visualized. No evidence of pulmonic stenosis. Aorta: The aortic root is normal in size and structure. Venous: The inferior vena cava is normal in  size with greater than 50% respiratory variability, suggesting right atrial pressure of 3 mmHg. IAS/Shunts: No atrial level shunt detected by color flow Doppler.  LEFT VENTRICLE PLAX 2D LVIDd:         4.30 cm   Diastology LVIDs:         2.40 cm   LV e' medial:    5.66 cm/s LV PW:         1.20 cm   LV E/e' medial:  14.5 LV IVS:        1.20 cm   LV e' lateral:   5.66 cm/s LVOT diam:     1.90 cm   LV E/e' lateral: 14.5 LV SV:         75 LV SV Index:   42 LVOT Area:     2.84 cm  RIGHT VENTRICLE RV S prime:     14.90 cm/s TAPSE (M-mode): 2.9 cm LEFT ATRIUM             Index        RIGHT ATRIUM           Index LA diam:        3.50 cm 1.98 cm/m   RA Area:     14.90 cm LA Vol (A2C):   55.0 ml 31.19 ml/m  RA Volume:   35.50 ml  20.13 ml/m LA Vol (A4C):   50.0 ml 28.35 ml/m LA Biplane Vol: 52.4 ml 29.71 ml/m  AORTIC VALVE AV Area (Vmax):    1.75 cm AV Area (Vmean):   1.76 cm AV Area (VTI):     2.05 cm AV Vmax:           163.50 cm/s AV Vmean:  116.133 cm/s AV VTI:            0.364 m AV Peak Grad:      10.7 mmHg AV Mean Grad:      5.8 mmHg LVOT Vmax:         101.00 cm/s LVOT Vmean:        72.200 cm/s LVOT VTI:          0.263 m LVOT/AV VTI ratio: 0.72  AORTA Ao Root diam: 3.00 cm MITRAL VALVE MV Area (PHT): 3.60 cm     SHUNTS MV Decel Time: 211 msec     Systemic VTI:  0.26 m MV E velocity: 81.80 cm/s   Systemic Diam: 1.90 cm MV A velocity: 108.00 cm/s MV E/A ratio:  0.76 Carlyle Dolly MD Electronically signed by Carlyle Dolly MD Signature Date/Time: 03/29/2021/4:05:42 PM    Final     Subjective:   Patient was seen and examined 03/31/2021, 12:38 PM Patient stable today. No acute distress.  No issues overnight Stable for discharge.  Discharge Exam:    Vitals:   03/30/21 0457 03/30/21 1307 03/30/21 2116 03/31/21 0524  BP: (!) 175/90 (!) 158/70 (!) 173/70 (!) 190/68  Pulse: 70 65 70 61  Resp: 16 15 20 19   Temp: 98.7 F (37.1 C) 98.2 F (36.8 C) 98.2 F (36.8 C) 98.2 F (36.8 C)  TempSrc:  Oral  Oral Oral  SpO2: 98% 97% 96% 99%  Weight:      Height:        General: Pt lying comfortably in bed & appears in no obvious distress. Cardiovascular: S1 & S2 heard, RRR, S1/S2 +. No murmurs, rubs, gallops or clicks. No JVD or pedal edema. Respiratory: Clear to auscultation without wheezing, rhonchi or crackles. No increased work of breathing. Abdominal:  Non-distended, non-tender & soft. No organomegaly or masses appreciated. Normal bowel sounds heard. CNS: Alert and oriented. No focal deficits. Extremities: no edema, no cyanosis      The results of significant diagnostics from this hospitalization (including imaging, microbiology, ancillary and laboratory) are listed below for reference.      Microbiology:   Recent Results (from the past 240 hour(s))  Resp Panel by RT-PCR (Flu A&B, Covid) Nasopharyngeal Swab     Status: None   Collection Time: 03/29/21  9:57 AM   Specimen: Nasopharyngeal Swab; Nasopharyngeal(NP) swabs in vial transport medium  Result Value Ref Range Status   SARS Coronavirus 2 by RT PCR NEGATIVE NEGATIVE Final    Comment: (NOTE) SARS-CoV-2 target nucleic acids are NOT DETECTED.  The SARS-CoV-2 RNA is generally detectable in upper respiratory specimens during the acute phase of infection. The lowest concentration of SARS-CoV-2 viral copies this assay can detect is 138 copies/mL. A negative result does not preclude SARS-Cov-2 infection and should not be used as the sole basis for treatment or other patient management decisions. A negative result may occur with  improper specimen collection/handling, submission of specimen other than nasopharyngeal swab, presence of viral mutation(s) within the areas targeted by this assay, and inadequate number of viral copies(<138 copies/mL). A negative result must be combined with clinical observations, patient history, and epidemiological information. The expected result is Negative.  Fact Sheet for Patients:   EntrepreneurPulse.com.au  Fact Sheet for Healthcare Providers:  IncredibleEmployment.be  This test is no t yet approved or cleared by the [place] FDA and  has been authorized for detection and/or diagnosis of SARS-CoV-2 by FDA under an Emergency Use Authorization (EUA). This EUA will remain  in effect (meaning this test can be used) for the duration of the COVID-19 declaration under Section 564(b)(1) of the Act, 21 U.S.C.section 360bbb-3(b)(1), unless the authorization is terminated  or revoked sooner.       Influenza A by PCR NEGATIVE NEGATIVE Final   Influenza B by PCR NEGATIVE NEGATIVE Final    Comment: (NOTE) The Xpert Xpress SARS-CoV-2/FLU/RSV plus assay is intended as an aid in the diagnosis of influenza from Nasopharyngeal swab specimens and should not be used as a sole basis for treatment. Nasal washings and aspirates are unacceptable for Xpert Xpress SARS-CoV-2/FLU/RSV testing.  Fact Sheet for Patients: EntrepreneurPulse.com.au  Fact Sheet for Healthcare Providers: IncredibleEmployment.be  This test is not yet approved or cleared by the [place] FDA and has been authorized for detection and/or diagnosis of SARS-CoV-2 by FDA under an Emergency Use Authorization (EUA). This EUA will remain in effect (meaning this test can be used) for the duration of the COVID-19 declaration under Section 564(b)(1) of the Act, 21 U.S.C. section 360bbb-3(b)(1), unless the authorization is terminated or revoked.  Performed at [facility], [address] [place], [place] [address]      Labs:   CBC: Recent Labs  Lab 03/29/21 [phone] 03/30/21 0514  WBC 3.9* 4.2  NEUTROABS 1.6*  --   HGB 12.9  12.0 12.0  HCT 38.0  37.0 38.3  MCV 82.6 82.0  PLT 156 150   Basic Metabolic Panel: Recent Labs  Lab 03/29/21 0937 03/30/21 0514  NA 142  144 144  K 3.2*  3.4* 3.7  CL 109  107 114*  CO2 25 21*   GLUCOSE 136*  131* 103*  BUN 11  9 10   CREATININE 1.28*  1.40* 1.30*  CALCIUM 9.3 9.6  MG 1.6* 2.1   Liver Function Tests: Recent Labs  Lab 03/29/21 0937  AST 20  ALT 13  ALKPHOS 57  BILITOT 0.3  PROT 6.0*  ALBUMIN 3.4*   BNP (last 3 results) No results for input(s): BNP in the last 8760 hours. Cardiac Enzymes: No results for input(s): CKTOTAL, CKMB, CKMBINDEX, TROPONINI in the last 168 hours. CBG: Recent Labs  Lab 03/29/21 0921  GLUCAP 133*   Hgb A1c No results for input(s): HGBA1C in the last 72 hours. Lipid Profile No results for input(s): CHOL, HDL, LDLCALC, TRIG, CHOLHDL, LDLDIRECT in the last 72 hours. Thyroid function studies Recent Labs    03/29/21 0937  TSH 3.079   Anemia work up Recent Labs    03/29/21 0937  VITAMINB12 749  FOLATE 4.0*   Urinalysis    Component Value Date/Time   COLORURINE YELLOW 03/29/2021 1248   APPEARANCEUR HAZY (A) 03/29/2021 1248   LABSPEC 1.015 03/29/2021 1248   PHURINE 8.5 (H) 03/29/2021 1248   GLUCOSEU NEGATIVE 03/29/2021 1248   HGBUR NEGATIVE 03/29/2021 [place] 03/29/2021 1248   [place] 03/29/2021 [place] 03/29/2021 1248   NITRITE NEGATIVE 03/29/2021 1248   LEUKOCYTESUR NEGATIVE 03/29/2021 1248         Time coordinating discharge: Over 45 minutes  SIGNED: Deatra James, MD, FACP, FHM. [facility],  Please use [email or web address] to Page If 7PM-7AM, please contact night-coverage [email or web address],  03/31/2021, 12:38 PM

## 2021-05-05 ENCOUNTER — Other Ambulatory Visit: Payer: Self-pay | Admitting: Neurology

## 2021-05-05 ENCOUNTER — Other Ambulatory Visit (HOSPITAL_COMMUNITY): Payer: Self-pay | Admitting: Neurology

## 2021-05-05 DIAGNOSIS — R220 Localized swelling, mass and lump, head: Secondary | ICD-10-CM

## 2021-05-17 ENCOUNTER — Ambulatory Visit (HOSPITAL_COMMUNITY)
Admission: RE | Admit: 2021-05-17 | Discharge: 2021-05-17 | Disposition: A | Discharge status: Home / Self Care | Payer: Medicare HMO | Source: Ambulatory Visit | Attending: Neurology | Admitting: Neurology

## 2021-05-17 ENCOUNTER — Other Ambulatory Visit: Payer: Self-pay

## 2021-05-17 DIAGNOSIS — R220 Localized swelling, mass and lump, head: Secondary | ICD-10-CM | POA: Diagnosis present

## 2021-05-17 IMAGING — MR MR HEAD WO/W CM
13 of 15 series · 30 of 48 positions shown · IV contrast (gadavist)
Comparison: MRI head [DATE]
COMPARISON: MRI head [DATE]

Addendum:
CLINICAL DATA: Localized swelling, mass, and lump of head.

EXAM:
MRI HEAD WITHOUT AND WITH CONTRAST
TECHNIQUE: Multiplanar, multiecho pulse sequences of the brain and surrounding
structures were obtained without and with intravenous contrast.
CONTRAST:  7mL GADAVIST GADOBUTROL 1 MMOL/ML IV SOLN

[Series 5: DWI · axial · 3.0mm · 0.77mm/px · z∈[-45,+92]mm · 4 of 48 slices shown (1 of 4)]
[im 1/48]
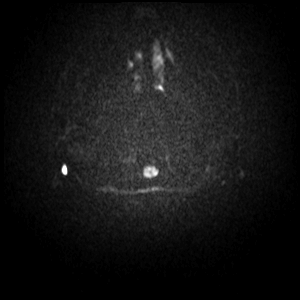
[im 16/48]
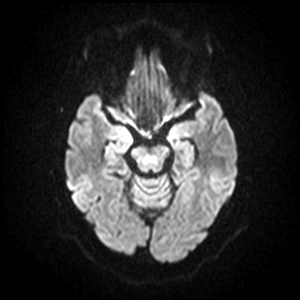
[im 32/48]
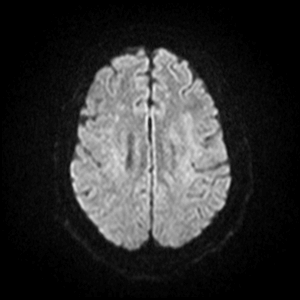
[im 48/48]
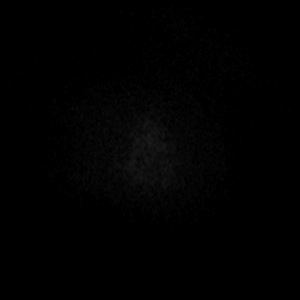

[Series 6: DWI · axial · 3.0mm · 0.77mm/px · z∈[-45,+92]mm · 4 of 48 slices shown (2 of 4)]
[im 1/48]
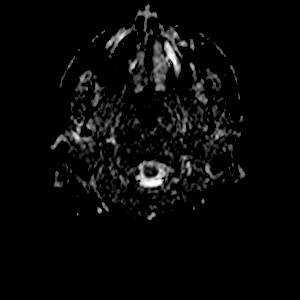
[im 16/48]
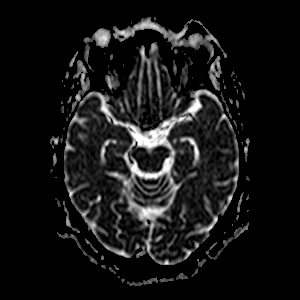
[im 32/48]
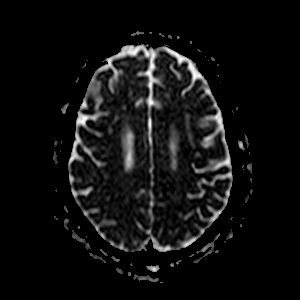
[im 48/48]
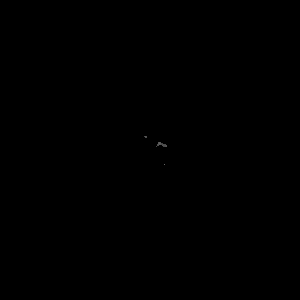

[Series 7: DWI · coronal · 5.0mm · 0.88mm/px · 2 of 32 slices shown (3 of 4)]
[im 1/32]
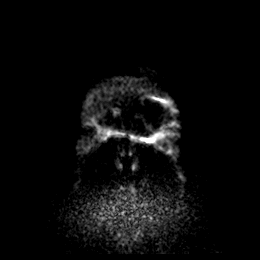
[im 32/32]
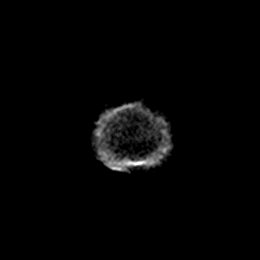

[Series 8: DWI · coronal · 5.0mm · 0.88mm/px · 2 of 32 slices shown (4 of 4)]
[im 1/32]
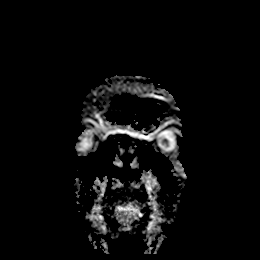
[im 32/32]
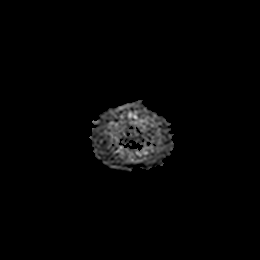

[Series 9: T1 · sagittal · 5.0mm · 0.75mm/px · 1 of 21 slices shown]
[im 1/21]
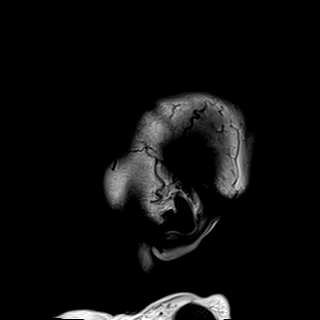

[Series 10: T2 · axial · 5.0mm · 0.72mm/px · 1 of 22 slices shown]
[im 1/22]
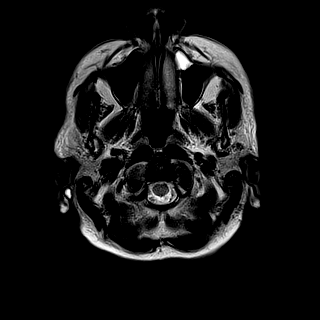

[Series 11: mag_images · axial · 3.0mm · 0.90mm/px · z∈[-51,+98]mm · 3 of 52 slices shown]
[im 1/52]
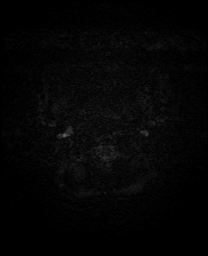
[im 26/52]
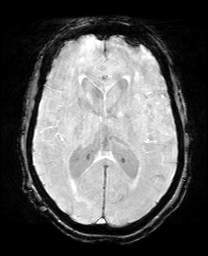
[im 52/52]
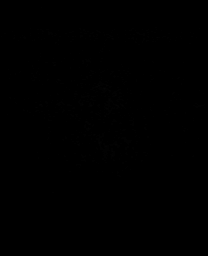

[Series 12: pha_images · axial · 3.0mm · 0.90mm/px · z∈[-48,+89]mm · 3 of 48 slices shown]
[im 1/48]
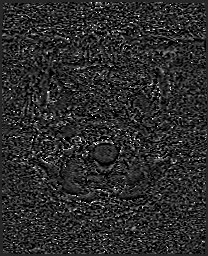
[im 24/48]
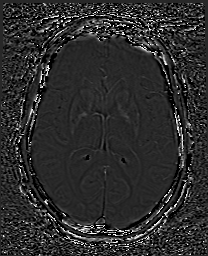
[im 48/48]
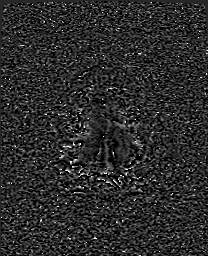

[Series 13: swi_images · axial · 3.0mm · 0.90mm/px · z∈[-51,+95]mm · 3 of 51 slices shown]
[im 1/51]
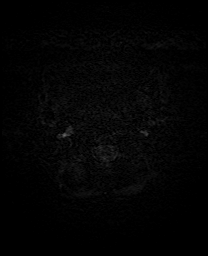
[im 26/51]
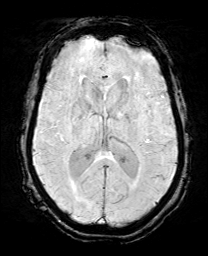
[im 51/51]
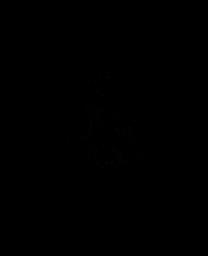

[Series 15: FLAIR · axial · 4.0mm · 0.43mm/px · z∈[-47,+97]mm · 2 of 38 slices shown]
[im 1/38]
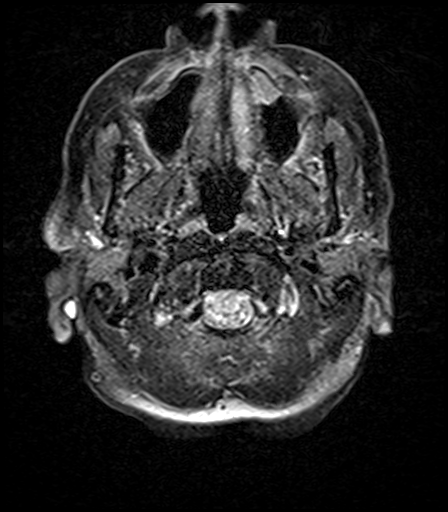
[im 38/38]
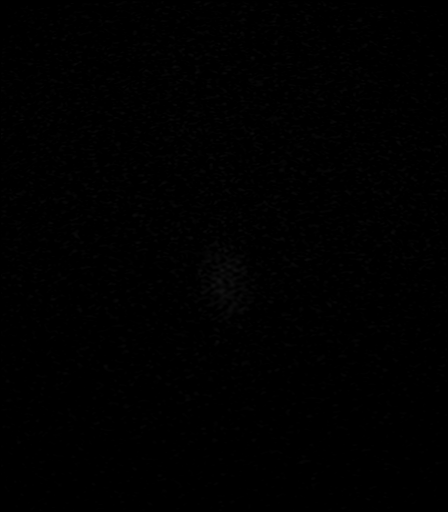

[Series 17: T2 post-contrast · coronal · 5.0mm · 0.72mm/px · 2 of 34 slices shown]
[im 1/34]
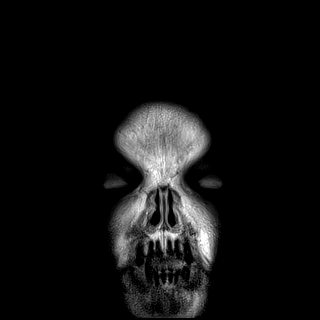
[im 34/34]
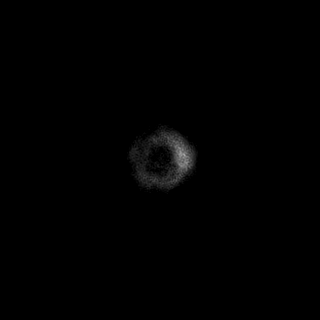

[Series 19: T1 post-contrast · coronal · 5.0mm · 0.34mm/px · 2 of 30 slices shown (1 of 2)]
[im 1/30]
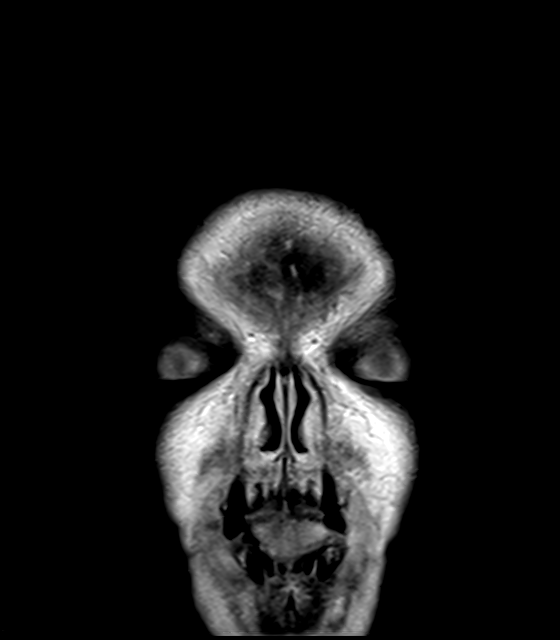
[im 30/30]
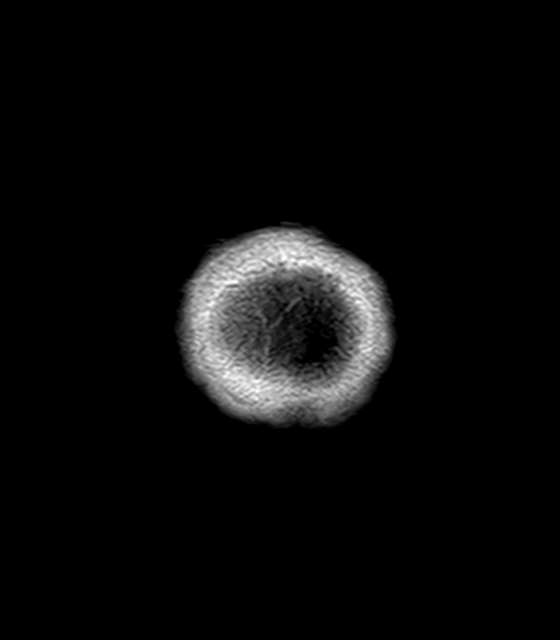

[Series 20: T1 post-contrast · sagittal · 5.0mm · 0.75mm/px · 1 of 21 slices shown (2 of 2)]
[im 1/21]
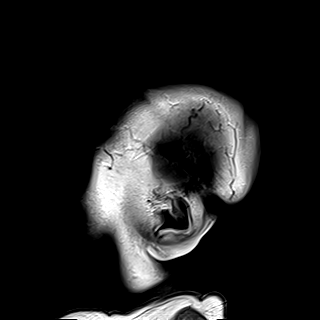

[30 of 48 positions shown; findings below may reference images not displayed]

FINDINGS: Brain: Ventricle size and cerebral volume normal for age. Mild to
moderate white matter changes in both cerebral hemispheres, similar
to the prior study.

Negative for acute infarct, hemorrhage, mass. Normal enhancement
following contrast administration.

Vascular: Normal arterial flow voids

Skull and upper cervical spine: No focal skeletal lesion.

Sinuses/Orbits: Mucosal edema left maxillary sinus. Mild mucosal
edema elsewhere. Negative orbit

Other: 5 mm cyst in the subcutaneous tissues posterior to the right
ear.

5 mm lesion in the right parietal scalp on axial image 11 which is
hypointense on T2. Benign appearance.
IMPRESSION: No acute intracranial abnormality

Mild to moderate chronic microvascular ischemic change in the white
matter

ADDENDUM:
Comparison made with the MRI of [DATE] revealing abnormality in
the right parietal lobe. This abnormality has resolved and now
appears normal. The sulcal FLAIR hyperintensity has resolved.
Subarachnoid enhancement also has resolved. This may have been due
to infection, hemorrhage, seizure disorder or other inflammatory
process in the subarachnoid space.

*** End of Addendum ***
FINDINGS: Brain: Ventricle size and cerebral volume normal for age. Mild to
moderate white matter changes in both cerebral hemispheres, similar
to the prior study.

Negative for acute infarct, hemorrhage, mass. Normal enhancement
following contrast administration.

Vascular: Normal arterial flow voids

Skull and upper cervical spine: No focal skeletal lesion.

Sinuses/Orbits: Mucosal edema left maxillary sinus. Mild mucosal
edema elsewhere. Negative orbit

Other: 5 mm cyst in the subcutaneous tissues posterior to the right
ear.

5 mm lesion in the right parietal scalp on axial image 11 which is
hypointense on T2. Benign appearance.
IMPRESSION: No acute intracranial abnormality

Mild to moderate chronic microvascular ischemic change in the white
matter

## 2021-05-17 MED ORDER — GADOBUTROL 1 MMOL/ML IV SOLN
7.0000 mL | Freq: Once | INTRAVENOUS | Status: AC | PRN
  Administered 2021-05-17: 14:00:00 7 mL via INTRAVENOUS

## 2021-05-30 ENCOUNTER — Other Ambulatory Visit: Payer: Self-pay

## 2021-05-30 ENCOUNTER — Observation Stay (HOSPITAL_COMMUNITY)
Admission: EM | Admit: 2021-05-30 | Discharge: 2021-06-01 | Disposition: A | Discharge status: Home / Self Care | Payer: Medicare HMO | Attending: Internal Medicine | Admitting: Internal Medicine

## 2021-05-30 ENCOUNTER — Emergency Department (HOSPITAL_COMMUNITY): Payer: Medicare HMO

## 2021-05-30 ENCOUNTER — Encounter (HOSPITAL_COMMUNITY): Payer: Self-pay

## 2021-05-30 ENCOUNTER — Inpatient Hospital Stay (HOSPITAL_COMMUNITY): Payer: Medicare HMO

## 2021-05-30 DIAGNOSIS — Z79899 Other long term (current) drug therapy: Secondary | ICD-10-CM | POA: Insufficient documentation

## 2021-05-30 DIAGNOSIS — G9341 Metabolic encephalopathy: Secondary | ICD-10-CM | POA: Diagnosis not present

## 2021-05-30 DIAGNOSIS — W19XXXA Unspecified fall, initial encounter: Secondary | ICD-10-CM | POA: Insufficient documentation

## 2021-05-30 DIAGNOSIS — Z20822 Contact with and (suspected) exposure to covid-19: Secondary | ICD-10-CM | POA: Diagnosis not present

## 2021-05-30 DIAGNOSIS — S06340A Traumatic hemorrhage of right cerebrum without loss of consciousness, initial encounter: Secondary | ICD-10-CM | POA: Diagnosis not present

## 2021-05-30 DIAGNOSIS — R531 Weakness: Secondary | ICD-10-CM

## 2021-05-30 DIAGNOSIS — E876 Hypokalemia: Secondary | ICD-10-CM | POA: Insufficient documentation

## 2021-05-30 DIAGNOSIS — F0280 Dementia in other diseases classified elsewhere without behavioral disturbance: Secondary | ICD-10-CM | POA: Insufficient documentation

## 2021-05-30 DIAGNOSIS — G20 Parkinson's disease: Secondary | ICD-10-CM | POA: Diagnosis not present

## 2021-05-30 DIAGNOSIS — N390 Urinary tract infection, site not specified: Secondary | ICD-10-CM | POA: Diagnosis not present

## 2021-05-30 DIAGNOSIS — R296 Repeated falls: Secondary | ICD-10-CM

## 2021-05-30 DIAGNOSIS — S0634AA Traumatic hemorrhage of right cerebrum with loss of consciousness status unknown, initial encounter: Principal | ICD-10-CM | POA: Insufficient documentation

## 2021-05-30 DIAGNOSIS — I10 Essential (primary) hypertension: Secondary | ICD-10-CM | POA: Diagnosis present

## 2021-05-30 DIAGNOSIS — I619 Nontraumatic intracerebral hemorrhage, unspecified: Secondary | ICD-10-CM | POA: Diagnosis present

## 2021-05-30 DIAGNOSIS — N3001 Acute cystitis with hematuria: Secondary | ICD-10-CM | POA: Diagnosis not present

## 2021-05-30 DIAGNOSIS — S0990XA Unspecified injury of head, initial encounter: Secondary | ICD-10-CM | POA: Diagnosis present

## 2021-05-30 LAB — COMPREHENSIVE METABOLIC PANEL
ALT: 32 U/L (ref 0–44)
AST: 28 U/L (ref 15–41)
Albumin: 3.7 g/dL (ref 3.5–5.0)
Alkaline Phosphatase: 55 U/L (ref 38–126)
Anion gap: 10 (ref 5–15)
BUN: 14 mg/dL (ref 8–23)
CO2: 26 mmol/L (ref 22–32)
Calcium: 9.7 mg/dL (ref 8.9–10.3)
Chloride: 108 mmol/L (ref 98–111)
Creatinine, Ser: 1.31 mg/dL — ABNORMAL HIGH (ref 0.44–1.00)
GFR, Estimated: 42 mL/min — ABNORMAL LOW (ref >60)
Glucose, Bld: 89 mg/dL (ref 70–99)
Potassium: 3.6 mmol/L (ref 3.5–5.1)
Sodium: 144 mmol/L (ref 135–145)
Total Bilirubin: 0.7 mg/dL (ref 0.3–1.2)
Total Protein: 6.4 g/dL — ABNORMAL LOW (ref 6.5–8.1)

## 2021-05-30 LAB — CBC WITH DIFFERENTIAL/PLATELET
Abs Immature Granulocytes: 0.01 10*3/uL (ref 0.00–0.07)
Basophils Absolute: 0 10*3/uL (ref 0.0–0.1)
Basophils Relative: 1 %
Eosinophils Absolute: 0.2 10*3/uL (ref 0.0–0.5)
Eosinophils Relative: 3 %
HCT: 37.7 % (ref 36.0–46.0)
Hemoglobin: 12 g/dL (ref 12.0–15.0)
Immature Granulocytes: 0 %
Lymphocytes Relative: 39 %
Lymphs Abs: 1.8 10*3/uL (ref 0.7–4.0)
MCH: 27 pg (ref 26.0–34.0)
MCHC: 31.8 g/dL (ref 30.0–36.0)
MCV: 84.9 fL (ref 80.0–100.0)
Monocytes Absolute: 0.5 10*3/uL (ref 0.1–1.0)
Monocytes Relative: 11 %
Neutro Abs: 2.1 10*3/uL (ref 1.7–7.7)
Neutrophils Relative %: 46 %
Platelets: 166 10*3/uL (ref 150–400)
RBC: 4.44 MIL/uL (ref 3.87–5.11)
RDW: 16.9 % — ABNORMAL HIGH (ref 11.5–15.5)
WBC: 4.7 10*3/uL (ref 4.0–10.5)
nRBC: 0 % (ref 0.0–0.2)

## 2021-05-30 LAB — URINALYSIS, ROUTINE W REFLEX MICROSCOPIC
Bilirubin Urine: NEGATIVE
Glucose, UA: NEGATIVE mg/dL
Ketones, ur: 20 mg/dL — AB
Nitrite: NEGATIVE
Protein, ur: NEGATIVE mg/dL
Specific Gravity, Urine: 1.028 (ref 1.005–1.030)
pH: 5 (ref 5.0–8.0)

## 2021-05-30 LAB — RESP PANEL BY RT-PCR (FLU A&B, COVID) ARPGX2
Influenza A by PCR: NEGATIVE
Influenza B by PCR: NEGATIVE
SARS Coronavirus 2 by RT PCR: NEGATIVE

## 2021-05-30 LAB — TROPONIN I (HIGH SENSITIVITY)
Troponin I (High Sensitivity): 46 ng/L — ABNORMAL HIGH (ref <18)
Troponin I (High Sensitivity): 44 ng/L — ABNORMAL HIGH (ref <18)

## 2021-05-30 LAB — ETHANOL: Alcohol, Ethyl (B): <10 mg/dL (ref <10)

## 2021-05-30 LAB — CBG MONITORING, ED: Glucose-Capillary: 89 mg/dL (ref 70–99)

## 2021-05-30 LAB — AMMONIA: Ammonia: 19 umol/L (ref 9–35)

## 2021-05-30 IMAGING — DX DG CHEST 2V
2 series · 2 of 2 positions shown · non-contrast
Comparison: Chest x-ray [DATE]

CLINICAL DATA: Cough.

EXAM:
CHEST - 2 VIEW

[chest lat]
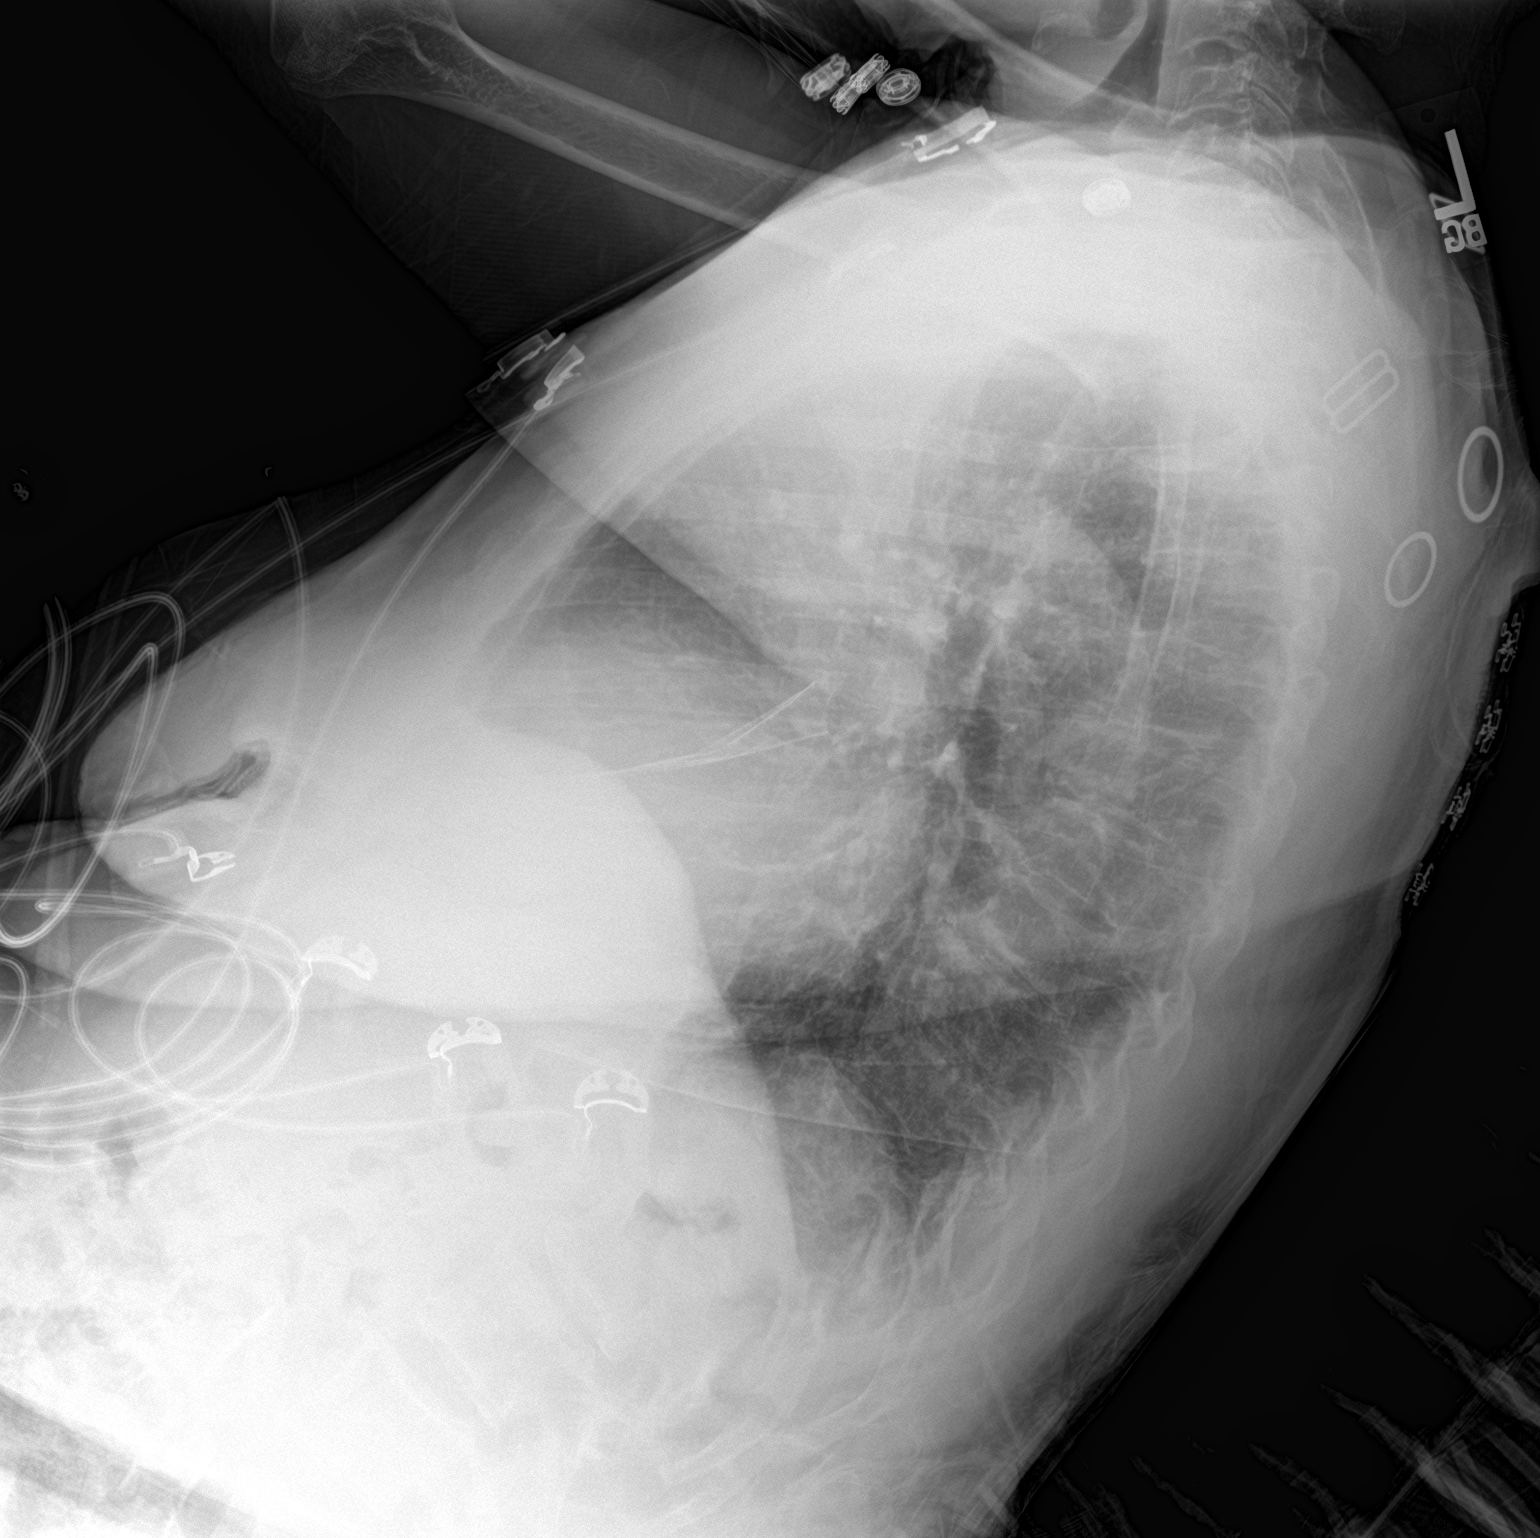

[chest ap]
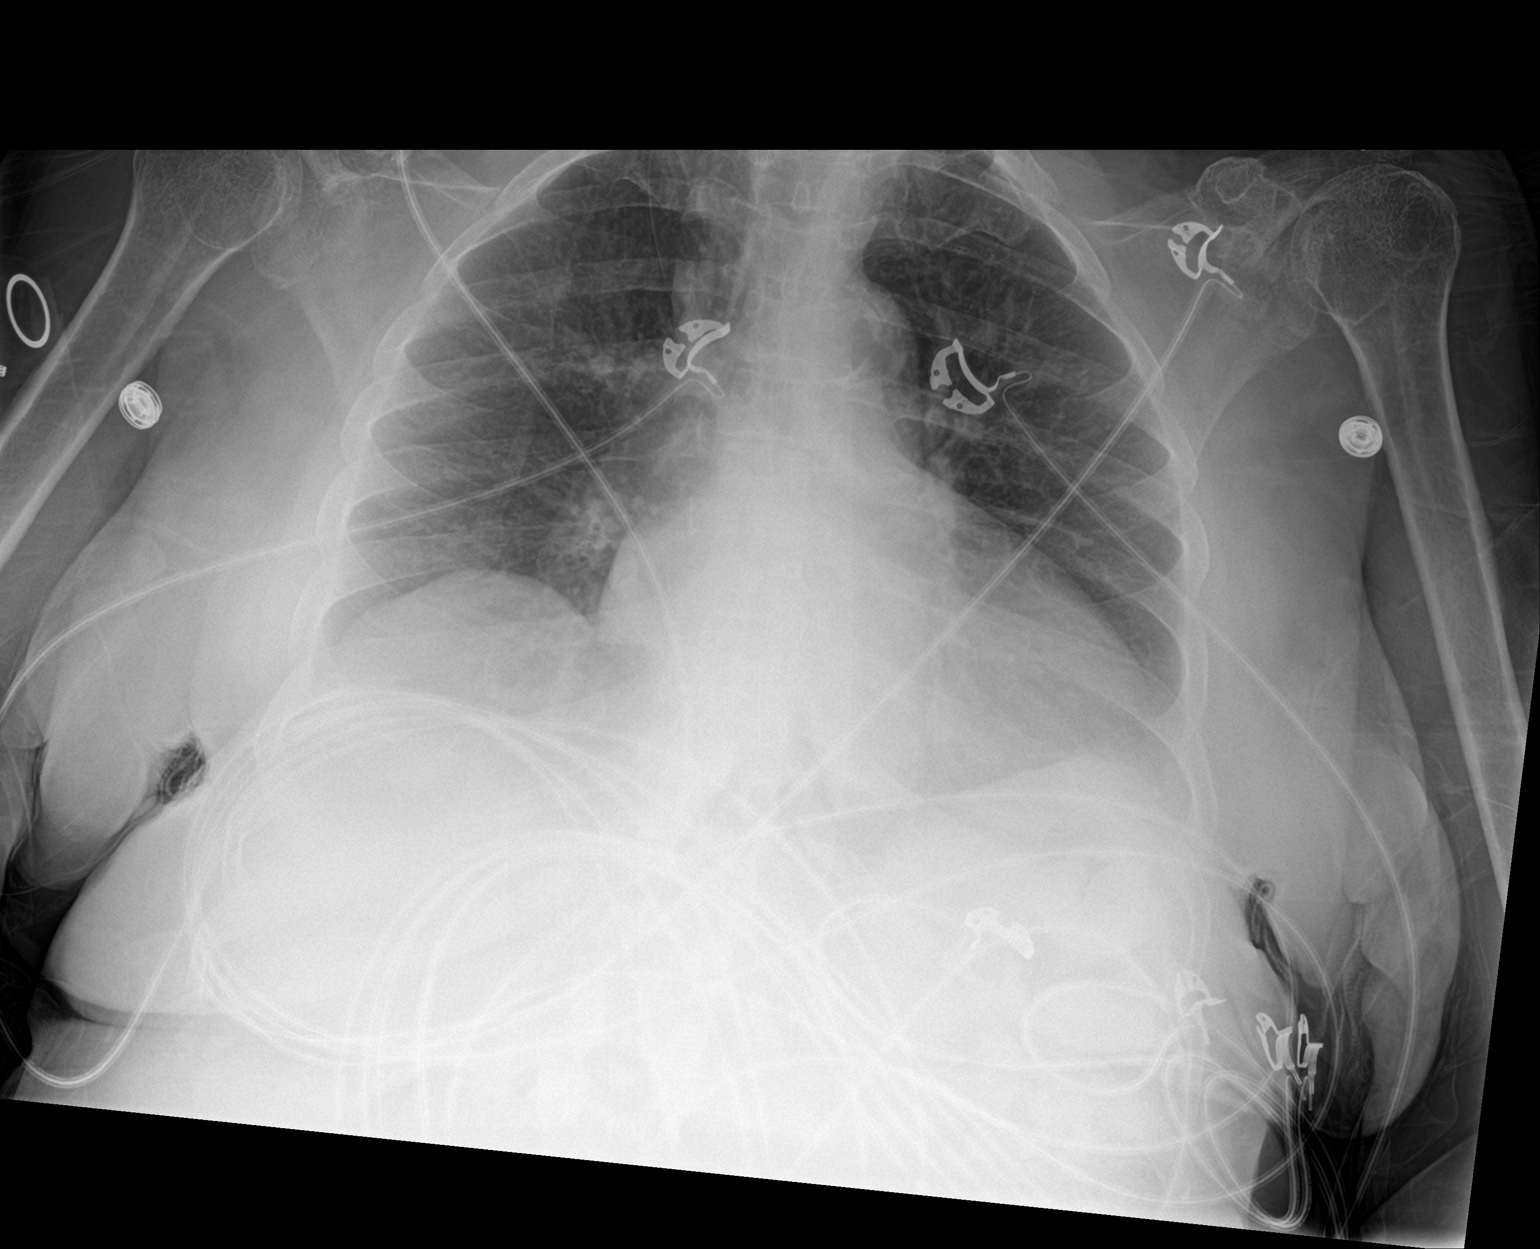

[2 of 2 positions shown; findings below may reference images not displayed]

FINDINGS: Heart is mildly enlarged. The lungs and costophrenic angles are
clear. There is no pneumothorax or acute fracture.
IMPRESSION: 1. No acute cardiopulmonary process.
2. Stable cardiomegaly.

## 2021-05-30 MED ORDER — IOHEXOL 350 MG/ML SOLN
100.0000 mL | Freq: Once | INTRAVENOUS | Status: AC | PRN
  Administered 2021-05-30: 60 mL via INTRAVENOUS

## 2021-05-30 MED ORDER — SODIUM CHLORIDE 0.9 % IV BOLUS
500.0000 mL | Freq: Once | INTRAVENOUS | Status: AC
  Administered 2021-05-30: 500 mL via INTRAVENOUS

## 2021-05-30 MED ORDER — CLEVIDIPINE BUTYRATE 0.5 MG/ML IV EMUL
0.0000 mg/h | INTRAVENOUS | Status: DC
  Administered 2021-05-30: 2 mg/h via INTRAVENOUS
  Filled 2021-05-30: qty 50

## 2021-05-30 NOTE — ED Triage Notes (Addendum)
Pt brought to ED by son for increased generalized weakness over the last 2-3 days, more sleepy than normal. Pt alert and oriented x 4. Pt son states she was started on something for sleep last week by Dr Juel Burrow office, and since she has been taking that medication it seems she has started getting weaker.

## 2021-05-30 NOTE — H&P (Signed)
History and Physical    Patient: Allison Watson UJW:[id number] DOB: 02/04/1941 DOA: 05/30/2021 DOS: the patient was seen and examined on 05/30/2021 PCP: Celene Squibb, MD  Patient coming from: Home  Chief Complaint:  Chief Complaint  Patient presents with   Weakness    HPI: Allison Watson is a 81 y.o. female with medical history significant of with a history of arthritis, gout, hypertension, CKD, and more presents the ED with a chief complaint of altered mental status.  Patient reports she does not know why she is here.  She is alert and oriented x3.  She is irritated with her children, claiming that they are bringing her to the hospital for no reason and she does not remember the things they are saying.  Family reports that patient was lethargic, sleeping all day, decreased p.o. intake, and falls for 3 days.  She had similar occurrence 2 months ago.  She was completely independent of all her ADLs for a month, then for last 2 to 3 days she has been totally different per the report.  They report she was recently started on a new medication for insomnia last week.  That medication is olanzapine.  They deny any behavioral issues or sundowning that the olanzapine might have been for.  They do reports she gets confused but is not necessarily at night, and that she has not been a behavior issue.  She has complained of dizziness, she has had generalized weakness, and she is required help with her ADLs during these last 2 to 3 days.  She has not had any dysuria, nausea, vomiting, diarrhea, constipation.  Family reports that they asked her after she fell what hurting what did she hit.  Patient is not able to tell me about the fall, because she reports she does not remember falling.  This fall was 2 of happened 1 to 2 days ago per the report.  Daughter reports the mother did have a coughing episode during the night a couple nights ago.  Patient adamantly denies that.  Patient has no other complaints at this  time.  Patient does not smoke, does not drink alcohol, does not use illicit drugs.  She is vaccinated for COVID.  Patient is full code.  Review of Systems: As mentioned in the history of present illness. All other systems reviewed and are negative. Past Medical History:  Diagnosis Date   Arthritis    Lt arm   Gout    Hypertension    Past Surgical History:  Procedure Laterality Date   ABDOMINAL HYSTERECTOMY     TUBAL LIGATION     Social History:  reports that she has quit smoking. She has never used smokeless tobacco. She reports that she does not drink alcohol and does not use drugs.  Allergies  Allergen Reactions   Codeine Rash   Lorazepam Other (See Comments)    Hallucinations per daughter [name]   Penicillins Rash    Has patient had a PCN reaction causing immediate rash, facial/tongue/throat swelling, SOB or lightheadedness with hypotension: Yes Has patient had a PCN reaction causing severe rash involving mucus membranes or skin necrosis: No Has patient had a PCN reaction that required hospitalization No Has patient had a PCN reaction occurring within the last 10 years: No If all of the above answers are "NO", then may proceed with Cephalosporin use.     History reviewed. No pertinent family history.  Prior to Admission medications   Medication Sig Start Date End Date  Taking? Authorizing Provider  albuterol (VENTOLIN HFA) 108 (90 Base) MCG/ACT inhaler Inhale 1-2 puffs into the lungs every 6 (six) hours as needed for wheezing or shortness of breath. Will use when too hot/ too cold outside   Yes [provider]  cholecalciferol (VITAMIN D3) 25 MCG (1000 UNIT) tablet Take 1,000 Units by mouth daily.   Yes [provider]  clonazePAM (KLONOPIN) 1 MG tablet Take 1 mg by mouth 2 (two) times daily as needed. 05/20/21  Yes [provider]  cyanocobalamin (,VITAMIN B-12,) 1000 MCG/ML injection Inject 1,000 mcg into the skin every 30 (thirty) days. 12/09/20   Yes [provider]  divalproex (DEPAKOTE) 250 MG DR tablet Take 1 tablet (250 mg total) by mouth every 12 (twelve) hours. 03/31/21 05/30/21 Yes Shahmehdi, Seyed A, MD  hydrALAZINE (APRESOLINE) 100 MG tablet Take 1 tablet (100 mg total) by mouth 2 (two) times daily. 10/09/20  Yes Emokpae, Courage, MD  labetalol (NORMODYNE) 100 MG tablet Take 1 tablet (100 mg total) by mouth 2 (two) times daily. 10/09/20  Yes Roxan Hockey, MD  Multiple Vitamin (MULTIVITAMIN) tablet Take 1 tablet by mouth daily.   Yes [provider]  OLANZapine (ZYPREXA) 10 MG tablet Take 1 tablet (10 mg total) by mouth at bedtime. 10/09/20  Yes Roxan Hockey, MD    Physical Exam: Vitals:   05/30/21 2215 05/30/21 2230 05/30/21 2245 05/30/21 2300  BP: (!) 146/69 (!) 146/84 (!) 158/98 (!) 177/76  Pulse: 80 (!) 42 (!) 50 62  Resp: 19 20 17  (!) 21  Temp:      TempSrc:      SpO2: 97% 92% 97% 98%  Weight:      Height:       1.  General: Patient lying supine in bed,  no acute distress   2. Psychiatric: Alert and oriented x 3, mood and behavior normal for situation, pleasant and cooperative with exam   3. Neurologic: Speech and language are normal, face is symmetric, moves all 4 extremities voluntarily, at baseline without acute deficits on limited exam, bilateral upper extremity tremors-chronic.  Tremor more severe on the right than the left.   4. HEENMT:  Head is atraumatic, normocephalic, pupils reactive to light, neck is supple, trachea is midline, mucous membranes are moist   5. Respiratory : Lungs are clear to auscultation bilaterally without wheezing, rhonchi, rales, no cyanosis, no increase in work of breathing or accessory muscle use   6. Cardiovascular : Heart rate normal, rhythm is regular, no murmurs, rubs or gallops, dependent edema at the ankles, peripheral pulses palpated   7. Gastrointestinal:  Abdomen is soft, nondistended, nontender to palpation bowel sounds active, no masses or  organomegaly palpated   8. Skin:  Skin is warm, dry and intact without rashes, acute lesions, or ulcers on limited exam   9.Musculoskeletal:  No acute deformities or trauma, no asymmetry in tone, peripheral pulses palpated, no tenderness to palpation in the extremities   Data Reviewed: In the ED Temp 99.1, heart rate 44-83, respiratory rate 13-28, blood pressure 133/99, satting at 96% Borderline leukopenia at 4.7, hemoglobin 12.0 Creatinine at baseline 1.31 Troponin flat 46, 44 Negative respiratory panel UA is indicative of UTI with moderate hematuria, ketonuria, moderate leukocytes, many bacteria, 6-10 white blood cells CT head shows new small intraparenchymal hemorrhage in the inferior right frontal lobe new since MRI on 05/17/2021, favor posttraumatic hemorrhage.  No midline shift or mass effect.  No hydrocephalus. CTA head and negative for acute findings  Assessment and Plan: * Intraparenchymal hemorrhage of brain ([place])- (present on admission) Patient has had 2-3 falls in the last week -this is likely the etiology behind her intraparenchymal hemorrhage Neurosurgery reviewed the scan reports that the bleeding is so small there is no intervention required at this time. Advised transferring to [facility], neurochecks, systolic blood pressure less than 150 ED physician had started Cleviprex, but it was discontinued at the recommendation of the neurosurgeon We will likely need repeat scan in the a.m., but neurosurgery to evaluate upon arrival to [facility]  UTI (urinary tract infection)- (present on admission) UA is indicative of UTI Urine culture ordered Start Rocephin Continue to monitor  Recurrent falls Likely multifactorial secondary to infection and polypharmacy Fall started shortly after olanzapine was started Hold olanzapine Treat infection as above PT eval and treat  Acute metabolic encephalopathy- (present on admission) Likely multifactorial, with polypharmacy,  infection, intraparenchymal hemorrhage all contributing Hold new medication-olanzapine Reduce Klonopin dose Treat hemorrhage and UTI as above Reorient frequently Patient is currently alert and oriented x3 during my exam Continue to monitor  Essential hypertension- (present on admission) In the setting of intraparenchymal hemorrhage, systolic blood pressure goal 150 or less. Continue home dose of labetalol, add as needed hydralazine every 6 hours for systolic blood pressure greater than 150       Advance Care Planning:   Code Status: Prior full  Consults: Neurosurgery  Family Communication: Son and daughter at bedside  Severity of Illness: The appropriate patient status for this patient is OBSERVATION. Observation status is judged to be reasonable and necessary in order to provide the required intensity of service to ensure the patient's safety. The patient's presenting symptoms, physical exam findings, and initial radiographic and laboratory data in the context of their medical condition is felt to place them at decreased risk for further clinical deterioration. Furthermore, it is anticipated that the patient will be medically stable for discharge from the hospital within 2 midnights of admission.   Author: Rolla Plate, DO 05/30/2021 11:50 PM  For on call review [email or web address].

## 2021-05-30 NOTE — ED Provider Notes (Signed)
Emergency Department Provider Note   I have reviewed the triage vital signs and the nursing notes.   HISTORY  Chief Complaint Weakness   HPI Allison Watson is a 81 y.o. female past history reviewed below including multiple presentations with mental status change and reported outpatient diagnosis of early dementia with Parkinson's presents with her son.  He states that over the past several days he has noticed that she has been more sleepy than normal and has been less active.  She is awake and alert but sleeping more than normal.  He did not go to her last PCP visit but states that she was put on some medication for sleep which has been helping in that regard but since starting the medicine seems to have been more drowsy during the day.  He is unsure of the exact medication.  He is also noticed cough and congestion over the past 24 hours.  Patient notes some cough but denies any chest pain or abdominal discomfort.  She denies any headaches.  No witnessed seizure activity. Patient and son deny any falls. She is been compliant with her other medicines denies any other changes to dosing.  She did have an outpatient MRI and is due to follow with Dr. Merlene Laughter, with neurology, for review of that scan and discussion about further management.    Past Medical History:  Diagnosis Date   Arthritis    Lt arm   Gout    Hypertension     Review of Systems  Constitutional: No fever/chills. Positive somnolence at times and decreased activity levels.  Eyes: No visual changes. ENT: No sore throat. Cardiovascular: Denies chest pain. Respiratory: Denies shortness of breath. Positive cough.  Gastrointestinal: No abdominal pain.  No nausea, no vomiting.  No diarrhea.  No constipation. Genitourinary: Negative for dysuria. Musculoskeletal: Negative for back pain. Skin: Negative for rash. Neurological: Negative for headaches, focal weakness or  numbness.   ____________________________________________   PHYSICAL EXAM:  VITAL SIGNS: ED Triage Vitals  Enc Vitals Group     BP 05/30/21 1527 (!) 158/109     Pulse Rate 05/30/21 1527 80     Resp 05/30/21 1527 20     Temp 05/30/21 1527 99.1 F (37.3 C)     Temp Source 05/30/21 1527 Oral     SpO2 05/30/21 1527 98 %     Weight 05/30/21 1525 160 lb (72.6 kg)     Height 05/30/21 1525 5' (1.524 m)   Constitutional: Alert and oriented. Well appearing and in no acute distress. Eyes: Conjunctivae are normal. PERRL.  Head: Atraumatic. Nose: No congestion/rhinnorhea. Mouth/Throat: Mucous membranes are moist.  Neck: No stridor.   Cardiovascular: Normal rate, regular rhythm. Good peripheral circulation. Grossly normal heart sounds.   Respiratory: Normal respiratory effort.  No retractions. Lungs CTAB. Gastrointestinal: Soft and nontender. No distention.  Musculoskeletal: No lower extremity tenderness. Trace edema in the feet. No gross deformities of extremities. Neurologic:  Normal speech and language.  5/5 strength in the bilateral upper and lower extremities.  Patient has a slight tremor worse in the right upper extremity noted to be baseline by patient and son.  Skin:  Skin is warm, dry and intact. No rash noted.   ____________________________________________   LABS (all labs ordered are listed, but only abnormal results are displayed)  Labs Reviewed  URINALYSIS, ROUTINE W REFLEX MICROSCOPIC - Abnormal; Notable for the following components:      Result Value   APPearance HAZY (*)    Hgb  urine dipstick MODERATE (*)    Ketones, ur 20 (*)    Leukocytes,Ua MODERATE (*)    Bacteria, UA MANY (*)    All other components within normal limits  COMPREHENSIVE METABOLIC PANEL - Abnormal; Notable for the following components:   Creatinine, Ser 1.31 (*)    Total Protein 6.4 (*)    GFR, Estimated 42 (*)    All other components within normal limits  CBC WITH DIFFERENTIAL/PLATELET -  Abnormal; Notable for the following components:   RDW 16.9 (*)    All other components within normal limits  TROPONIN I (HIGH SENSITIVITY) - Abnormal; Notable for the following components:   Troponin I (High Sensitivity) 46 (*)    All other components within normal limits  TROPONIN I (HIGH SENSITIVITY) - Abnormal; Notable for the following components:   Troponin I (High Sensitivity) 44 (*)    All other components within normal limits  RESP PANEL BY RT-PCR (FLU A&B, COVID) ARPGX2  ETHANOL  AMMONIA  CBG MONITORING, ED   ____________________________________________  EKG   EKG Interpretation  Date/Time:  Monday May 30 2021 15:32:04 EST Ventricular Rate:  79 PR Interval:  140 QRS Duration: 74 QT Interval:  404 QTC Calculation: 463 R Axis:   -54 Text Interpretation: Normal sinus rhythm Left axis deviation Septal infarct , age undetermined ST & T wave abnormality, consider lateral ischemia Abnormal ECG When compared with ECG of 29-Mar-2021 11:40, PREVIOUS ECG IS PRESENT Confirmed by Nanda Quinton [phone]) on 05/30/2021 11:32:49 PM        ____________________________________________  RADIOLOGY  CT Angio Head W or Wo Contrast  Result Date: 05/30/2021 CLINICAL DATA:  Initial evaluation for cerebral aneurysm screening, high risk. EXAM: CT ANGIOGRAPHY HEAD TECHNIQUE: Multidetector CT imaging of the head was performed using the standard protocol during bolus administration of intravenous contrast. Multiplanar CT image reconstructions and MIPs were obtained to evaluate the vascular anatomy. RADIATION DOSE REDUCTION: This exam was performed according to the departmental dose-optimization program which includes automated exposure control, adjustment of the mA and/or kV according to patient size and/or use of iterative reconstruction technique. CONTRAST:  60mL OMNIPAQUE IOHEXOL 350 MG/ML SOLN COMPARISON:  Prior CT from earlier the same day. FINDINGS: CTA HEAD Anterior circulation: Visualized  distal cervical segments of the internal carotid arteries are patent bilaterally. Distal cervical right ICA tortuous. Petrous segments widely patent. Scattered atheromatous plaque within the carotid siphons with no more than mild multifocal narrowing. A1 segments widely patent. Normal anterior communicating artery complex. Anterior cerebral arteries patent to their distal aspects. No M1 stenosis or occlusion. Normal MCA bifurcations. Distal MCA branches well perfused and symmetric. Posterior circulation: Both vertebral arteries patent to the vertebrobasilar junction without stenosis. Right vertebral artery dominant. Both PICA origins patent and normal. Basilar patent to its distal aspect without stenosis. Superior cerebellar arteries patent bilaterally. Both PCAs primarily supplied via the basilar well perfused or distal aspects. Venous sinuses: Patent allowing for timing the contrast bolus. Anatomic variants: None significant. No intracranial aneurysm or other vascular malformation. Review of the MIP images confirms the above findings. IMPRESSION: 1. Negative CTA of the head. No aneurysm or other vascular malformation identified. 2. Mild atheromatous change about the carotid siphons without hemodynamically significant or correctable stenosis. Electronically Signed   By: Jeannine Boga M.D.   On: 05/30/2021 21:28   DG Chest 2 View  Result Date: 05/30/2021 CLINICAL DATA:  Cough. EXAM: CHEST - 2 VIEW COMPARISON:  Chest x-ray 03/29/2021 FINDINGS: Heart is mildly enlarged. The lungs  and costophrenic angles are clear. There is no pneumothorax or acute fracture. IMPRESSION: 1. No acute cardiopulmonary process. 2. Stable cardiomegaly. Electronically Signed   By: Ronney Asters M.D.   On: 05/30/2021 19:04   CT HEAD WO CONTRAST (5MM)  Result Date: 05/30/2021 CLINICAL DATA:  Mental status change.  Increased weakness EXAM: CT HEAD WITHOUT CONTRAST TECHNIQUE: Contiguous axial images were obtained from the base of the  skull through the vertex without intravenous contrast. RADIATION DOSE REDUCTION: This exam was performed according to the departmental dose-optimization program which includes automated exposure control, adjustment of the mA and/or kV according to patient size and/or use of iterative reconstruction technique. COMPARISON:  Brain MRI 05/17/2021 FINDINGS: Brain: Within the inferior RIGHT frontal lobe there is a new hypodensity within the white matter and cerebral cortex (image 13/2 and image 21/5). This region of involvement measures approximately 1.5 x 2 cm. No lesion present on recent MRI of the brain therefore findings favored acute parenchymal hemorrhage. Additional intracranial hemorrhage evident. No midline shift or mass effect. No intraventricular hemorrhage. No extra-axial fluid. Vascular: No hyperdense vessel or unexpected calcification. Skull: Normal. Negative for fracture or focal lesion. Sinuses/Orbits: No acute finding. Other: None. IMPRESSION: 1. New small intraparenchymal hemorrhage in the inferior RIGHT frontal lobe. Finding new from recent MRI (05/17/2021). Favor posttraumatic hemorrhage. 2. No midline shift or mass effect. 3. No hydrocephalus.  Basal cisterns are patent. Critical Value/emergent results were called by telephone at the time of interpretation on 05/30/2021 at 7:01 pm to provider Shoshannah Faubert , who verbally acknowledged these results. Electronically Signed   By: Suzy Bouchard M.D.   On: 05/30/2021 19:01    ____________________________________________   PROCEDURES  Procedure(s) performed:   Procedures  CRITICAL CARE Performed by: Margette Fast Total critical care time: 35 minutes Critical care time was exclusive of separately billable procedures and treating other patients. Critical care was necessary to treat or prevent imminent or life-threatening deterioration. Critical care was time spent personally by me on the following activities: development of treatment plan with  patient and/or surrogate as well as nursing, discussions with consultants, evaluation of patient's response to treatment, examination of patient, obtaining history from patient or surrogate, ordering and performing treatments and interventions, ordering and review of laboratory studies, ordering and review of radiographic studies, pulse oximetry and re-evaluation of patient's condition.  Nanda Quinton, MD Emergency Medicine  ____________________________________________   INITIAL IMPRESSION / ASSESSMENT AND PLAN / ED COURSE  Pertinent labs & imaging results that were available during my care of the patient were reviewed by me and considered in my medical decision making (see chart for details).   This patient is Presenting for Evaluation of AMS, which does require a range of treatment options, and is a complaint that involves a high risk of morbidity and mortality.  The Differential Diagnoses includes but is not exclusive to alcohol, illicit or prescription medications, intracranial pathology such as stroke, intracerebral hemorrhage, fever or infectious causes including sepsis, hypoxemia, uremia, trauma, endocrine related disorders such as diabetes, hypoglycemia, thyroid-related diseases, etc.   Critical Interventions- IVF and CT head with labs   Medications  sodium chloride 0.9 % bolus 500 mL (0 mLs Intravenous Stopped 05/30/21 2002)  iohexol (OMNIPAQUE) 350 MG/ML injection 100 mL (60 mLs Intravenous Contrast Given 05/30/21 2059)    Reassessment after intervention: mental status unchanged. Vitals remain stable.    I did obtain Additional Historical Information from son at bedside.   I decided to review pertinent External Data, and  in summary patient with multiple ED visits with AMS. She has had extensive w/u including EEG, MRI, LP, and labs.   Clinical Laboratory Tests Ordered, included UA showing moderate leukocytes with bacteria.  Patient denies any UTI symptoms.  No leukocytosis or fever.   Hold on treatment for now and will send for culture.  COVID and flu were negative.  Ammonia, ethanol within normal limits.  Patient does not appear uremic on labs.  Troponin mildly elevated but no active chest pain.  Radiologic Tests Ordered, included CT head. I independently interpreted the images and agree with radiology interpretation.   Cardiac Monitor Tracing which shows NSR.   Social Determinants of Health Risk former smoker   Medical Decision Making: Summary:  Patient presents to the emergency department with altered mental status (somnolence).   Reevaluation with update and discussion with patient. Denies any falls although pattern appears traumatic. Son reports some recent dementia diagnosis and so history may be less reliable but no outward sign of injury. Per discussed with Dr. Cheral Marker will admit with plan for CTA head and BP control (SBP< 150).   Consult complete with Neurology. Discussed case with Dr. Earnestine Leys. No known trauma history, despite radiology read, although history is spotty. Will get CTA and control BP with plan for admit to [facility] service at [facility].   09:50 PM  Additional history now available from additional family and bleed appears to be from trauma related to multiple falls.  Spoke with Dr. Cheral Marker who defers to neurosurgery.  Spoke with NSG [facility]) who advises keep blood pressure below 150 but likely does not require Cleviprex for traumatic hemorrhage.  Doubt that this amount of blood would lead to the patient's somnolence.  Recommend medical service admit to [facility] and they can consult but no NSG intervention planned.   Discussed patient's case with [facility], Dr. Clearence Ped to request admission. Patient and family (if present) updated with plan. Care transferred to [facility] service.  I reviewed all nursing notes, vitals, pertinent old records, EKGs, labs, imaging (as available).  Disposition: admit   ____________________________________________  FINAL CLINICAL  IMPRESSION(S) / ED DIAGNOSES  Final diagnoses:  Generalized weakness  Traumatic hemorrhage of right cerebrum with unknown loss of consciousness status, initial encounter    Note:  This document was prepared using Dragon voice recognition software and may include unintentional dictation errors.  Nanda Quinton, MD, [facility] Emergency Medicine    Admire Bunnell, Wonda [facility], MD 05/30/21 [phone]

## 2021-05-30 NOTE — ED Notes (Signed)
Son reports that patient has fallen 3 times in the last 2 days. Son went to patients house today and found her on the bedroom floor laying prone, pt was unsure of how long she was laying there. Pt has been taking Olanzapine for sleep and this affects her coordination but pt reports it helps her sleep. Son is concerned this medication is causing her to fall.

## 2021-05-30 NOTE — ED Notes (Signed)
Pt given small sips of Diet Sprite per her request. Pt tolerated well.

## 2021-05-31 DIAGNOSIS — R296 Repeated falls: Secondary | ICD-10-CM

## 2021-05-31 DIAGNOSIS — I619 Nontraumatic intracerebral hemorrhage, unspecified: Secondary | ICD-10-CM

## 2021-05-31 DIAGNOSIS — N390 Urinary tract infection, site not specified: Secondary | ICD-10-CM | POA: Diagnosis present

## 2021-05-31 DIAGNOSIS — I10 Essential (primary) hypertension: Secondary | ICD-10-CM | POA: Diagnosis not present

## 2021-05-31 DIAGNOSIS — G20 Parkinson's disease: Secondary | ICD-10-CM | POA: Diagnosis not present

## 2021-05-31 DIAGNOSIS — S0634AA Traumatic hemorrhage of right cerebrum with loss of consciousness status unknown, initial encounter: Secondary | ICD-10-CM | POA: Diagnosis not present

## 2021-05-31 DIAGNOSIS — G9341 Metabolic encephalopathy: Secondary | ICD-10-CM | POA: Diagnosis not present

## 2021-05-31 DIAGNOSIS — S0990XA Unspecified injury of head, initial encounter: Secondary | ICD-10-CM | POA: Diagnosis present

## 2021-05-31 DIAGNOSIS — Z20822 Contact with and (suspected) exposure to covid-19: Secondary | ICD-10-CM | POA: Diagnosis not present

## 2021-05-31 DIAGNOSIS — F0280 Dementia in other diseases classified elsewhere without behavioral disturbance: Secondary | ICD-10-CM | POA: Diagnosis not present

## 2021-05-31 DIAGNOSIS — E876 Hypokalemia: Secondary | ICD-10-CM | POA: Diagnosis not present

## 2021-05-31 DIAGNOSIS — W19XXXA Unspecified fall, initial encounter: Secondary | ICD-10-CM | POA: Diagnosis not present

## 2021-05-31 DIAGNOSIS — Z79899 Other long term (current) drug therapy: Secondary | ICD-10-CM | POA: Diagnosis not present

## 2021-05-31 LAB — CBC WITH DIFFERENTIAL/PLATELET
Abs Immature Granulocytes: 0.01 10*3/uL (ref 0.00–0.07)
Basophils Absolute: 0 10*3/uL (ref 0.0–0.1)
Basophils Relative: 1 %
Eosinophils Absolute: 0.2 10*3/uL (ref 0.0–0.5)
Eosinophils Relative: 4 %
HCT: 36.9 % (ref 36.0–46.0)
Hemoglobin: 12 g/dL (ref 12.0–15.0)
Immature Granulocytes: 0 %
Lymphocytes Relative: 45 %
Lymphs Abs: 1.8 10*3/uL (ref 0.7–4.0)
MCH: 27 pg (ref 26.0–34.0)
MCHC: 32.5 g/dL (ref 30.0–36.0)
MCV: 82.9 fL (ref 80.0–100.0)
Monocytes Absolute: 0.5 10*3/uL (ref 0.1–1.0)
Monocytes Relative: 11 %
Neutro Abs: 1.6 10*3/uL — ABNORMAL LOW (ref 1.7–7.7)
Neutrophils Relative %: 39 %
Platelets: 150 10*3/uL (ref 150–400)
RBC: 4.45 MIL/uL (ref 3.87–5.11)
RDW: 16.7 % — ABNORMAL HIGH (ref 11.5–15.5)
WBC: 4.1 10*3/uL (ref 4.0–10.5)
nRBC: 0 % (ref 0.0–0.2)

## 2021-05-31 LAB — MAGNESIUM: Magnesium: 1.9 mg/dL (ref 1.7–2.4)

## 2021-05-31 LAB — TSH: TSH: 1.518 u[IU]/mL (ref 0.350–4.500)

## 2021-05-31 MED ORDER — HYDRALAZINE HCL 20 MG/ML IJ SOLN: 10.0000 mg | Freq: Four times a day (QID) | INTRAMUSCULAR | Status: DC | PRN

## 2021-05-31 MED ORDER — ACETAMINOPHEN 650 MG RE SUPP: 650.0000 mg | Freq: Four times a day (QID) | RECTAL | Status: DC | PRN

## 2021-05-31 MED ORDER — ALBUTEROL SULFATE (2.5 MG/3ML) 0.083% IN NEBU: 2.5000 mg | INHALATION_SOLUTION | Freq: Four times a day (QID) | RESPIRATORY_TRACT | Status: DC | PRN

## 2021-05-31 MED ORDER — LABETALOL HCL 100 MG PO TABS
100.0000 mg | ORAL_TABLET | Freq: Two times a day (BID) | ORAL | Status: DC
  Administered 2021-05-31 – 2021-06-01 (×4): 100 mg via ORAL
  Filled 2021-05-31 (×4): qty 1

## 2021-05-31 MED ORDER — HYDRALAZINE HCL 50 MG PO TABS
50.0000 mg | ORAL_TABLET | Freq: Two times a day (BID) | ORAL | Status: DC
  Administered 2021-05-31 – 2021-06-01 (×2): 50 mg via ORAL
  Filled 2021-05-31 (×2): qty 1

## 2021-05-31 MED ORDER — ONDANSETRON HCL 4 MG/2ML IJ SOLN: 4.0000 mg | Freq: Four times a day (QID) | INTRAMUSCULAR | Status: DC | PRN

## 2021-05-31 MED ORDER — OXYCODONE HCL 5 MG PO TABS: 5.0000 mg | ORAL_TABLET | ORAL | Status: DC | PRN

## 2021-05-31 MED ORDER — DIVALPROEX SODIUM 250 MG PO DR TAB
250.0000 mg | DELAYED_RELEASE_TABLET | Freq: Two times a day (BID) | ORAL | Status: DC
  Administered 2021-05-31 – 2021-06-01 (×4): 250 mg via ORAL
  Filled 2021-05-31 (×4): qty 1

## 2021-05-31 MED ORDER — CLONAZEPAM 0.5 MG PO TABS: 0.5000 mg | ORAL_TABLET | Freq: Two times a day (BID) | ORAL | Status: DC | PRN

## 2021-05-31 MED ORDER — ONDANSETRON HCL 4 MG PO TABS: 4.0000 mg | ORAL_TABLET | Freq: Four times a day (QID) | ORAL | Status: DC | PRN

## 2021-05-31 MED ORDER — HYDRALAZINE HCL 20 MG/ML IJ SOLN: 5.0000 mg | Freq: Four times a day (QID) | INTRAMUSCULAR | Status: DC | PRN

## 2021-05-31 MED ORDER — ACETAMINOPHEN 325 MG PO TABS: 650.0000 mg | ORAL_TABLET | Freq: Four times a day (QID) | ORAL | Status: DC | PRN

## 2021-05-31 MED ORDER — SODIUM CHLORIDE 0.9 % IV SOLN
1.0000 g | INTRAVENOUS | Status: DC
  Administered 2021-06-01: 1 g via INTRAVENOUS
  Filled 2021-05-31: qty 10

## 2021-05-31 MED ORDER — SODIUM CHLORIDE 0.9 % IV SOLN
1.0000 g | Freq: Once | INTRAVENOUS | Status: AC
  Administered 2021-05-31: 1 g via INTRAVENOUS
  Filled 2021-05-31: qty 10

## 2021-05-31 NOTE — Assessment & Plan Note (Signed)
Likely multifactorial secondary to infection and polypharmacy Fall started shortly after olanzapine was started Hold olanzapine Treat infection as above PT eval and treat

## 2021-05-31 NOTE — Assessment & Plan Note (Signed)
UA is indicative of UTI Urine culture ordered Start Rocephin Continue to monitor

## 2021-05-31 NOTE — Assessment & Plan Note (Signed)
Likely multifactorial, with polypharmacy, infection, intraparenchymal hemorrhage all contributing Hold new medication-olanzapine Reduce Klonopin dose Treat hemorrhage and UTI as above Reorient frequently Patient is currently alert and oriented x3 during my exam Continue to monitor

## 2021-05-31 NOTE — Care Management CC44 (Signed)
Condition Code 44 Documentation Completed  Patient Details  Name: Allison Watson MRN: [id number] Date of Birth: Jul 09, 1940   Condition Code 44 given:  Yes Patient signature on Condition Code 44 notice:  Yes Documentation of 2 MD's agreement:  Yes Code 44 added to claim:  Yes    Geralynn Ochs, LCSW 05/31/2021, 1:35 PM

## 2021-05-31 NOTE — Assessment & Plan Note (Signed)
Patient has had 2-3 falls in the last week -this is likely the etiology behind her intraparenchymal hemorrhage Neurosurgery reviewed the scan reports that the bleeding is so small there is no intervention required at this time. Advised transferring to [facility], neurochecks, systolic blood pressure less than 150 ED physician had started Cleviprex, but it was discontinued at the recommendation of the neurosurgeon We will likely need repeat scan in the a.m., but neurosurgery to evaluate upon arrival to [facility]

## 2021-05-31 NOTE — Evaluation (Addendum)
Clinical/Bedside Swallow Evaluation Patient Details  Name: Allison Watson MRN: [id number] Date of Birth: July 16, 1940  Today's Date: 05/31/2021 Time: SLP Start Time (ACUTE ONLY): 1200 SLP Stop Time (ACUTE ONLY): 1220 SLP Time Calculation (min) (ACUTE ONLY): 20 min  Past Medical History:  Past Medical History:  Diagnosis Date   Arthritis    Lt arm   Gout    Hypertension    Past Surgical History:  Past Surgical History:  Procedure Laterality Date   ABDOMINAL HYSTERECTOMY     TUBAL LIGATION     HPI:  81 y.o. female presenting to ED 2/6 with AMS, poor p.o. intake, generalized weakness and recurrent falls x3 in 2 days. Imaging (+) for small intraparenchymal hemorrhage. No intervention required per neurology. UA (+) UTI. PMHx significant for HTN, insomnia recently started on olanzapine, gout and arthritis.    Assessment / Plan / Recommendation  Clinical Impression  Pt has tremors of her mandible and tongue noted during oral motor exam, which per chart appears to have been present over the last year or so. Pt does however appear to have adequate ability to orally manipulate and clear boluses at least with use of a liquid wash, as she does not have any significant oral residue noted. She also not have overt signs concerning for aspiration either. However, her swallowing seems to be most impacted by current mentation. She is distractible, also exhibiting imoulsivity and reduced awareness. Due to her mentation, would start with Dys 2 (finely chopped) solids and thin liquids, although anticipate good potential for advancement particularly if her mentation improves. SLP Visit Diagnosis: Dysphagia, oral phase (R13.11)    Aspiration Risk  Mild aspiration risk;Moderate aspiration risk;Risk for inadequate nutrition/hydration    Diet Recommendation Dysphagia 2 (Fine chop);Thin liquid   Liquid Administration via: Cup;Straw Medication Administration: Whole meds with puree Supervision: Staff to assist  with self feeding (full supervision at least for first meal) Compensations: Minimize environmental distractions;Small sips/bites;Slow rate Postural Changes: Seated upright at 90 degrees    Other  Recommendations Oral Care Recommendations: Oral care BID    Recommendations for follow up therapy are one component of a multi-disciplinary discharge planning process, led by the attending physician.  Recommendations may be updated based on patient status, additional functional criteria and insurance authorization.  Follow up Recommendations Home health SLP vs Outpatient SLP     Assistance Recommended at Discharge Frequent or constant Supervision/Assistance  Functional Status Assessment Patient has had a recent decline in their functional status and demonstrates the ability to make significant improvements in function in a reasonable and predictable amount of time.  Frequency and Duration min 2x/week  2 weeks       Prognosis Prognosis for Safe Diet Advancement: Good Barriers to Reach Goals: Cognitive deficits      Swallow Study   General HPI: 81 y.o. female presenting to ED 2/6 with AMS, poor p.o. intake, generalized weakness and recurrent falls x3 in 2 days. Imaging (+) for small intraparenchymal hemorrhage. No intervention required per neurology. UA (+) UTI. PMHx significant for HTN, insomnia recently started on olanzapine, gout and arthritis. Type of Study: Bedside Swallow Evaluation Previous Swallow Assessment: none in chart Diet Prior to this Study: NPO Temperature Spikes Noted: No Respiratory Status: Room air History of Recent Intubation: No Behavior/Cognition: Alert;Cooperative;Distractible;Requires cueing Oral Cavity Assessment: Within Functional Limits Oral Care Completed by SLP: No Oral Cavity - Dentition: Adequate natural dentition Vision: Functional for self-feeding Self-Feeding Abilities: Needs assist Patient Positioning: Upright in bed Baseline Vocal Quality:  Normal Volitional Cough: Weak Volitional Swallow: Able to elicit    Oral/Motor/Sensory Function Overall Oral Motor/Sensory Function: Other (comment) (tremors of jaw, tongue)   Ice Chips Ice chips: Not tested   Thin Liquid Thin Liquid: Within functional limits Presentation: Straw;Self Fed    Nectar Thick Nectar Thick Liquid: Not tested   Honey Thick Honey Thick Liquid: Not tested   Puree Puree: Impaired Presentation: Spoon Oral Phase Impairments: Poor awareness of bolus   Solid     Solid: Impaired Presentation: Self Fed Oral Phase Impairments: Impaired mastication      Osie Bond., M.A. [place] [facility] Pager [phone] Office [phone]  05/31/2021,1:03 PM

## 2021-05-31 NOTE — Evaluation (Signed)
Occupational Therapy Evaluation Patient Details Name: Allison Watson MRN: [id number] DOB: 1940/07/03 Today's Date: 05/31/2021   History of Present Illness 81 y.o. female presenting to ED 2/6 with AMS, poor p.o. intake, generalized weakness and recurrent falls x3 in 2 days. Imaging (+) for small intraparenchymal hemorrhage. No intervention required per neurology. UA (+) UTI. PMHx significant for HTN, insomnia recently started on olanzapine, gout and arthritis.   Clinical Impression   PTA patient was living alone in an apartment and was grossly Mod I with ADLs without AD. Per med chart patient has recently required assist with ADLs secondary to confusion and weakness. Children usually assist with IADLs including meal prep and housekeeping. Patient reports family also provides transportation and supervision A for money management and med management. Patient currently functioning below baseline demonstrating observed ADLs with supervision to light Min A overall. Strength equal bilaterally and patient denies changes in vision. Patient also demonstrates bed mobility, ADL transfers and short-distance mobility with supervision to Min guard overall without AD.  Patient limited by deficits listed below including decreased cognition scoring 18/28 on SBT indicating presence of cognitive deficits, decreased coordination and generalized weakness and would benefit from continued acute OT services in prep for safe d/c home with family. Patient likely to progress well with treatment of UTI with no follow-up OT recommended at this time. Recommend initial 24hr supervision/assist at time of d/c. Noted R hand and facial tremors which patient reports have been present for nearly 1 year. No diagnosis documented in med chart to explain tremors.      Recommendations for follow up therapy are one component of a multi-disciplinary discharge planning process, led by the attending physician.  Recommendations may be updated based on  patient status, additional functional criteria and insurance authorization.   Follow Up Recommendations  No OT follow up    Assistance Recommended at Discharge Frequent or constant Supervision/Assistance  Patient can return home with the following A little help with walking and/or transfers;A little help with bathing/dressing/bathroom;Direct supervision/assist for medications management;Direct supervision/assist for financial management;Assist for transportation;Assistance with cooking/housework    Functional Status Assessment  Patient has had a recent decline in their functional status and demonstrates the ability to make significant improvements in function in a reasonable and predictable amount of time.  Equipment Recommendations  Other (comment) (TBD)    Recommendations for Other Services       Precautions / Restrictions Precautions Precautions: Fall Precaution Comments: Hx of falls; R hand resting tremor; facial tremor; bradykinesia; UTI      Mobility Bed Mobility Overal bed mobility: Needs Assistance Bed Mobility: Supine to Sit     Supine to sit: Min assist     General bed mobility comments: Min A to bring trunk upright with HOB flat.    Transfers Overall transfer level: Needs assistance Equipment used: 1 person hand held assist Transfers: Sit to/from Stand Sit to Stand: Min guard           General transfer comment: Min gaurd for sit to stand from EOB positioned in lowest setting and from standard heigth commode in bathroom +rail. Increased time to rise. Bradykinesia.      Balance Overall balance assessment: Mild deficits observed, not formally tested                                         ADL either performed or assessed with clinical judgement  ADL Overall ADL's : Needs assistance/impaired     Grooming: Supervision/safety;Standing   Upper Body Bathing: Supervision/ safety;Sitting   Lower Body Bathing: Minimal assistance;Sit  to/from stand   Upper Body Dressing : Supervision/safety;Sitting   Lower Body Dressing: Minimal assistance;Sit to/from stand   Toilet Transfer: Magazine features editor Details (indicate cue type and reason): Min guard without AD. Assit for IV management. Toileting- Water quality scientist and Hygiene: Min guard;Sit to/from stand               Vision Baseline Vision/History: 1 Wears glasses Ability to See in Adequate Light: 0 Adequate Patient Visual Report: No change from baseline Vision Assessment?: No apparent visual deficits     Perception     Praxis      Pertinent Vitals/Pain Pain Assessment Pain Assessment: No/denies pain     Hand Dominance Right   Extremity/Trunk Assessment Upper Extremity Assessment Upper Extremity Assessment: Generalized weakness (MMT grossly 4/5 bilaterally)   Lower Extremity Assessment Lower Extremity Assessment: Defer to PT evaluation       Communication Communication Communication: No difficulties   Cognition Arousal/Alertness: Awake/alert Behavior During Therapy: Flat affect Overall Cognitive Status: No family/caregiver present to determine baseline cognitive functioning                                 General Comments: A&Ox4; deficits in STM and processing. Score 18/28 on SBT indicating cognitive impairment.     General Comments       Exercises     Shoulder Instructions      Home Living Family/patient expects to be discharged to:: Private residence Living Arrangements: Alone Available Help at Discharge: Family;Available PRN/intermittently Type of Home: Apartment Home Access: Level entry     Home Layout: One level     Bathroom Shower/Tub: Teacher, early years/pre: Standard     Home Equipment: None          Prior Functioning/Environment Prior Level of Function : Independent/Modified Independent             Mobility Comments: Ambulates in home and community dwellings without  AD. ADLs Comments: Mod I for ADLs at baseline; recently requiring assist for ADLs/IADLs. 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 Children assist with meal prep, housekeeping, money management and med management.        OT Problem List: Decreased strength;Decreased coordination;Decreased cognition;Decreased knowledge of use of DME or AE      OT Treatment/Interventions: Self-care/ADL training;Therapeutic exercise;Energy conservation;DME and/or AE instruction;Therapeutic activities;Cognitive remediation/compensation;Patient/family education;Balance training    OT Goals(Current goals can be found in the care plan section) Acute Rehab OT Goals Patient Stated Goal: To return home. OT Goal Formulation: With patient Time For Goal Achievement: 06/14/21 Potential to Achieve Goals: Good ADL Goals Pt Will Perform Grooming: with modified independence;standing Pt Will Perform Upper Body Dressing: with modified independence Pt Will Perform Lower Body Dressing: with modified independence;sit to/from stand Pt Will Transfer to Toilet: with modified independence;ambulating Pt Will Perform Toileting - Clothing Manipulation and hygiene: with modified independence;sit to/from stand Pt/caregiver will Perform Home Exercise Program: Increased strength;Both right and left upper extremity;With Supervision Additional ADL Goal #1: Patient will score >4/28 on SBT indicating improve cognition in prep for ADLs/IADLs.  OT Frequency: Min 2X/week    Co-evaluation              AM-PAC OT "6 Clicks" Daily Activity     Outcome Measure Help from  another person eating meals?: None Help from another person taking care of personal grooming?: A Little Help from another person toileting, which includes using toliet, bedpan, or urinal?: A Little Help from another person bathing (including washing, rinsing, drying)?: A Little Help from another person to put on and taking off regular upper body clothing?: A Little Help from  another person to put on and taking off regular lower body clothing?: A Little 6 Click Score: 19   End of Session Equipment Utilized During Treatment: Gait belt Nurse Communication: Mobility status  Activity Tolerance: Patient tolerated treatment well Patient left: in chair;with call bell/phone within reach;with chair alarm set  OT Visit Diagnosis: Unsteadiness on feet (R26.81);Muscle weakness (generalized) (M62.81);History of falling (Z91.81);Other symptoms and signs involving cognitive function                Time: 6761-9509 OT Time Calculation (min): 30 min Charges:  OT General Charges $OT Visit: 1 Visit OT Evaluation $OT Eval Low Complexity: 1 Low OT Treatments $Self Care/Home Management : 8-22 mins  Chrisopher Pustejovsky H. OTR/L Supplemental OT, Department of rehab services [phone]  Calie Buttrey R H. 05/31/2021, 9:18 AM

## 2021-05-31 NOTE — Assessment & Plan Note (Signed)
In the setting of intraparenchymal hemorrhage, systolic blood pressure goal 150 or less. Continue home dose of labetalol, add as needed hydralazine every 6 hours for systolic blood pressure greater than 150

## 2021-05-31 NOTE — Progress Notes (Signed)
PROGRESS NOTE  Allison Watson:[id number] DOB: [birth year] DOA: 05/30/2021 PCP: Celene Squibb, MD  HPI/Recap of past 24 hours: Allison Watson is a 81 y.o. female with medical history significant of arthritis, gout, HTN, CKD, SDH, presents to the ED with a chief complaint of AMS. Family reports that patient was lethargic, sleeping all day, decreased p.o. intake, and falls for 3 days.  She had similar occurrence 2 months ago. She was completely independent of all her ADLs for a month, then for last 2 to 3 days she has been totally different per the report.  They report she was recently started on olanzapine for insomnia last week. She has complained of dizziness, she has had generalized weakness, and she has required help with her ADLs during these last 2 to 3 days. Pt is alert and oriented x3.  Patient denies any other new complaints.  In the ED, CT head showed a small intraparenchymal hemorrhage in the right frontal lobe.  Neurosurgery consulted, recommended no neurosurgical intervention and no need for follow-up imaging unless a decline in neurological function. Patient transferred from [facility] to [facility] for further evaluation.  Patient admitted for further management.     Today, patient denies any new complaints, alert, awake, oriented x3.  Noted to be somewhat drooling while talking.  Noted right upper extremity tremor.  Denies any chest pain, abdominal pain, headaches, dizziness, shortness of breath, nausea/vomiting, fever/chills.    Assessment/Plan: Principal Problem:   Intraparenchymal hemorrhage of brain (HCC) Active Problems:   Essential hypertension   Acute metabolic encephalopathy   Recurrent falls   UTI (urinary tract infection)   Intraparenchymal hemorrhage of brain (HCC)- (present on admission) Likely 2/2 recent falls CT head showed above Neurosurgery consulted, recommended no neurosurgical intervention and no need for follow-up imaging unless a decline in  neurological function BP control, fall precautions Monitor closely  Recurrent falls Noted RUE tremor, ?? undiagnosed Parkinson's disease Neurology consulted to evaluate for Parkinson's disease,pending PT/OT/SLP Fall precautions   UTI (urinary tract infection)- (present on admission) Afebrile, with no leukocytosis UA showed moderate leukocytes, many bacteria Urine culture has not been collected yet (it was previously added on) Started already on Rocephin Monitor  Acute metabolic encephalopathy- (present on admission) Likely multifactorial, with polypharmacy, possible UTI, intraparenchymal hemorrhage all contributing Fall started shortly after olanzapine was started Hold new medication-olanzapine Reduce Klonopin dose Further management as above   Essential hypertension- (present on admission) BP somewhat elevated In the setting of intraparenchymal hemorrhage, SBP 150 or less Continue home dose of labetalol, decrease home hydralazine to 50 mg twice daily, may adjust pending BP IV hydralazine as needed Monitor closely  CKD stage IIIb Currently better than baseline Daily BMP  Obesity Lifestyle modification advised       Estimated body mass index is 31.25 kg/m as calculated from the following:   Height as of this encounter: 5' (1.524 m).   Weight as of this encounter: 72.6 kg.       Code Status: Full  Family Communication: None at bedside  Disposition Plan: Status is: Observation The patient remains OBS appropriate and will d/c before 2 midnights.      Consultants: Neurosurgery Neurology  Procedures: None  Antimicrobials: Ceftriaxone  DVT prophylaxis: SCDs   Objective: Vitals:   05/31/21 0000 05/31/21 0122 05/31/21 0318 05/31/21 0733  BP: (!) 177/83 (!) 153/78 (!) 173/66 (!) 157/88  Pulse: 75 74 76 73  Resp: 14  17 20   Temp:  99  F (37.2 C) 98.7 F (37.1 C) 97.6 F (36.4 C)  TempSrc:  Oral Oral Oral  SpO2: 99% 97% 97% 97%  Weight:       Height:        Intake/Output Summary (Last 24 hours) at 05/31/2021 1625 Last data filed at 05/31/2021 0400 Gross per 24 hour  Intake 115.54 ml  Output --  Net 115.54 ml   Filed Weights   05/30/21 1525  Weight: 72.6 kg    Exam: General: NAD, noted RUE tremor, alert, awake, oriented x3 Cardiovascular: S1, S2 present Respiratory: CTAB Abdomen: Soft, nontender, nondistended, bowel sounds present Musculoskeletal: No bilateral pedal edema noted Skin: Normal Psychiatry: Normal mood  Neurology: No obvious focal neurologic deficits noted except above    Data Reviewed: CBC: Recent Labs  Lab 05/30/21 1838 05/31/21 0243  WBC 4.7 4.1  NEUTROABS 2.1 1.6*  HGB 12.0 12.0  HCT 37.7 36.9  MCV 84.9 82.9  PLT 166 269   Basic Metabolic Panel: Recent Labs  Lab 05/30/21 1838 05/31/21 0243  NA 144  --   K 3.6  --   CL 108  --   CO2 26  --   GLUCOSE 89  --   BUN 14  --   CREATININE 1.31*  --   CALCIUM 9.7  --   MG  --  1.9   GFR: Estimated Creatinine Clearance: 31.5 mL/min (A) (by C-G formula based on SCr of 1.31 mg/dL (H)). Liver Function Tests: Recent Labs  Lab 05/30/21 1838  AST 28  ALT 32  ALKPHOS 55  BILITOT 0.7  PROT 6.4*  ALBUMIN 3.7   No results for input(s): LIPASE, AMYLASE in the last 168 hours. Recent Labs  Lab 05/30/21 1838  AMMONIA 19   Coagulation Profile: No results for input(s): INR, PROTIME in the last 168 hours. Cardiac Enzymes: No results for input(s): CKTOTAL, CKMB, CKMBINDEX, TROPONINI in the last 168 hours. BNP (last 3 results) No results for input(s): PROBNP in the last 8760 hours. HbA1C: No results for input(s): HGBA1C in the last 72 hours. CBG: Recent Labs  Lab 05/30/21 1535  GLUCAP 89   Lipid Profile: No results for input(s): CHOL, HDL, LDLCALC, TRIG, CHOLHDL, LDLDIRECT in the last 72 hours. Thyroid Function Tests: Recent Labs    05/31/21 0243  TSH 1.518   Anemia Panel: No results for input(s): VITAMINB12, FOLATE,  FERRITIN, TIBC, IRON, RETICCTPCT in the last 72 hours. Urine analysis:    Component Value Date/Time   COLORURINE YELLOW 05/30/2021 2125   APPEARANCEUR HAZY (A) 05/30/2021 2125   LABSPEC 1.028 05/30/2021 2125   PHURINE 5.0 05/30/2021 2125   GLUCOSEU NEGATIVE 05/30/2021 2125   HGBUR MODERATE (A) 05/30/2021 2125   BILIRUBINUR NEGATIVE 05/30/2021 2125   KETONESUR 20 (A) 05/30/2021 2125   PROTEINUR NEGATIVE 05/30/2021 2125   NITRITE NEGATIVE 05/30/2021 2125   LEUKOCYTESUR MODERATE (A) 05/30/2021 2125   Sepsis Labs: @LABRCNTIP (procalcitonin:4,lacticidven:4)  ) Recent Results (from the past 240 hour(s))  Resp Panel by RT-PCR (Flu A&B, Covid) Nasopharyngeal Swab     Status: None   Collection Time: 05/30/21  6:47 PM   Specimen: Nasopharyngeal Swab; Nasopharyngeal(NP) swabs in vial transport medium  Result Value Ref Range Status   SARS Coronavirus 2 by RT PCR NEGATIVE NEGATIVE Final    Comment: (NOTE) SARS-CoV-2 target nucleic acids are NOT DETECTED.  The SARS-CoV-2 RNA is generally detectable in upper respiratory specimens during the acute phase of infection. The lowest concentration of SARS-CoV-2 viral copies this assay can  detect is 138 copies/mL. A negative result does not preclude SARS-Cov-2 infection and should not be used as the sole basis for treatment or other patient management decisions. A negative result may occur with  improper specimen collection/handling, submission of specimen other than nasopharyngeal swab, presence of viral mutation(s) within the areas targeted by this assay, and inadequate number of viral copies(<138 copies/mL). A negative result must be combined with clinical observations, patient history, and epidemiological information. The expected result is Negative.  Fact Sheet for Patients:  EntrepreneurPulse.com.au  Fact Sheet for Healthcare Providers:  IncredibleEmployment.be  This test is no t yet approved or  cleared by the [place] FDA and  has been authorized for detection and/or diagnosis of SARS-CoV-2 by FDA under an Emergency Use Authorization (EUA). This EUA will remain  in effect (meaning this test can be used) for the duration of the COVID-19 declaration under Section 564(b)(1) of the Act, 21 U.S.C.section 360bbb-3(b)(1), unless the authorization is terminated  or revoked sooner.       Influenza A by PCR NEGATIVE NEGATIVE Final   Influenza B by PCR NEGATIVE NEGATIVE Final    Comment: (NOTE) The Xpert Xpress SARS-CoV-2/FLU/RSV plus assay is intended as an aid in the diagnosis of influenza from Nasopharyngeal swab specimens and should not be used as a sole basis for treatment. Nasal washings and aspirates are unacceptable for Xpert Xpress SARS-CoV-2/FLU/RSV testing.  Fact Sheet for Patients: EntrepreneurPulse.com.au  Fact Sheet for Healthcare Providers: IncredibleEmployment.be  This test is not yet approved or cleared by the [place] FDA and has been authorized for detection and/or diagnosis of SARS-CoV-2 by FDA under an Emergency Use Authorization (EUA). This EUA will remain in effect (meaning this test can be used) for the duration of the COVID-19 declaration under Section 564(b)(1) of the Act, 21 U.S.C. section 360bbb-3(b)(1), unless the authorization is terminated or revoked.  Performed at [facility], [address] [place], [place] [address]       Studies: CT Angio Head W or Wo Contrast  Result Date: 05/30/2021 CLINICAL DATA:  Initial evaluation for cerebral aneurysm screening, high risk. EXAM: CT ANGIOGRAPHY HEAD TECHNIQUE: Multidetector CT imaging of the head was performed using the standard protocol during bolus administration of intravenous contrast. Multiplanar CT image reconstructions and MIPs were obtained to evaluate the vascular anatomy. RADIATION DOSE REDUCTION: This exam was performed according to the departmental  dose-optimization program which includes automated exposure control, adjustment of the mA and/or kV according to patient size and/or use of iterative reconstruction technique. CONTRAST:  44mL OMNIPAQUE IOHEXOL 350 MG/ML SOLN COMPARISON:  Prior CT from earlier the same day. FINDINGS: CTA HEAD Anterior circulation: Visualized distal cervical segments of the internal carotid arteries are patent bilaterally. Distal cervical right ICA tortuous. Petrous segments widely patent. Scattered atheromatous plaque within the carotid siphons with no more than mild multifocal narrowing. A1 segments widely patent. Normal anterior communicating artery complex. Anterior cerebral arteries patent to their distal aspects. No M1 stenosis or occlusion. Normal MCA bifurcations. Distal MCA branches well perfused and symmetric. Posterior circulation: Both vertebral arteries patent to the vertebrobasilar junction without stenosis. Right vertebral artery dominant. Both PICA origins patent and normal. Basilar patent to its distal aspect without stenosis. Superior cerebellar arteries patent bilaterally. Both PCAs primarily supplied via the basilar well perfused or distal aspects. Venous sinuses: Patent allowing for timing the contrast bolus. Anatomic variants: None significant. No intracranial aneurysm or other vascular malformation. Review of the MIP images confirms the above findings. IMPRESSION: 1. Negative  CTA of the head. No aneurysm or other vascular malformation identified. 2. Mild atheromatous change about the carotid siphons without hemodynamically significant or correctable stenosis. Electronically Signed   By: Jeannine Boga M.D.   On: 05/30/2021 21:28   DG Chest 2 View  Result Date: 05/30/2021 CLINICAL DATA:  Cough. EXAM: CHEST - 2 VIEW COMPARISON:  Chest x-ray 03/29/2021 FINDINGS: Heart is mildly enlarged. The lungs and costophrenic angles are clear. There is no pneumothorax or acute fracture. IMPRESSION: 1. No acute  cardiopulmonary process. 2. Stable cardiomegaly. Electronically Signed   By: Ronney Asters M.D.   On: 05/30/2021 19:04   CT HEAD WO CONTRAST (5MM)  Result Date: 05/30/2021 CLINICAL DATA:  Mental status change.  Increased weakness EXAM: CT HEAD WITHOUT CONTRAST TECHNIQUE: Contiguous axial images were obtained from the base of the skull through the vertex without intravenous contrast. RADIATION DOSE REDUCTION: This exam was performed according to the departmental dose-optimization program which includes automated exposure control, adjustment of the mA and/or kV according to patient size and/or use of iterative reconstruction technique. COMPARISON:  Brain MRI 05/17/2021 FINDINGS: Brain: Within the inferior RIGHT frontal lobe there is a new hypodensity within the white matter and cerebral cortex (image 13/2 and image 21/5). This region of involvement measures approximately 1.5 x 2 cm. No lesion present on recent MRI of the brain therefore findings favored acute parenchymal hemorrhage. Additional intracranial hemorrhage evident. No midline shift or mass effect. No intraventricular hemorrhage. No extra-axial fluid. Vascular: No hyperdense vessel or unexpected calcification. Skull: Normal. Negative for fracture or focal lesion. Sinuses/Orbits: No acute finding. Other: None. IMPRESSION: 1. New small intraparenchymal hemorrhage in the inferior RIGHT frontal lobe. Finding new from recent MRI (05/17/2021). Favor posttraumatic hemorrhage. 2. No midline shift or mass effect. 3. No hydrocephalus.  Basal cisterns are patent. Critical Value/emergent results were called by telephone at the time of interpretation on 05/30/2021 at 7:01 pm to provider JOSHUA LONG , who verbally acknowledged these results. Electronically Signed   By: Suzy Bouchard M.D.   On: 05/30/2021 19:01    Scheduled Meds:  divalproex  250 mg Oral Q12H   labetalol  100 mg Oral BID    Continuous Infusions:  [START ON 06/01/2021] cefTRIAXone (ROCEPHIN)  IV        LOS: 1 day     Alma Friendly, MD [facility]  If 7PM-7AM, please contact night-coverage [email or web address] 05/31/2021, 4:25 PM

## 2021-05-31 NOTE — Consult Note (Signed)
Neurology Consultation  Reason for Consult: Frequent falls and tremor Referring Physician: Dr. Horris Latino  CC: Frequent falls  History is obtained from: patient's son and daughter at bedside, chart review, patient is able to provide some information regarding her presentation  HPI: Allison Watson is a 81 y.o. female with a medical history significant for arthritis due to gout, essential hypertension, CKD 3b, and anemia due to CKD who presented to [facility] ED on 2/6 for evaluation of altered mental status. Of note, family reports a possible diagnosis in the past of Parkinson's versus Parkinsonism by Dr. Merlene Laughter at [facility] though review of notes do not reveal supporting documentation for any Parkinson's diagnosis or concern.   Patient arrived to the ED on 2/6 due to family reports of patient's fluctuating degrees of lethargy, confusion, alertness, and ability to care for self since July of 2022. Over the past 3 days prior to hospital arrival, the patient had been more lethargic, sleeping throughout the day, and had decreased oral intake. They have also reported at least 5 falls within the past two months that are unwitnessed where the patient was unable to get up on her own. Patient only recalls two falls where she tripped over a rug and rolled out of the bed because she did not realize how close to the side that she was. She denies any LOC. Patient's family expresses concern for intermittent confusion, levels of alertness, tremors, inability to complete ADLs independently, and falls since around July of 2022. A CTH was obtained on patient's arrival to the ED 2/6 revealing a small right frontal IPH s/p neurosurgery evaluation without recommendation for acute intervention.   Patient was seen in December of 2022 with concerns for drowsiness, decreased ability to complete her ADLs, and an episode of loss of consciousness with initial ED findings of severe hypertension with a blood pressure reading of 198/70. She  was also seen by neurology in June of 2022 for visual hallucinations for which she was placed on olanzapine with noted EPS but improvement in her symptoms at that time. Per Dr. Karolee Stamps note on 03/30/2021: "Per review of chart, patient was seen by neurology on 10/06/2020 for visual hallucinations during which MRI brain showed abnormal focal sulcal FLAIR hyperintensity and pial enhancement involving the posterior right cerebrum, including the right parietal, occipital and posterior temporal lobes. Findings are nonspecific with differential considerations including meningitis/encephalitis, postictal hyperemia, neurosarcoidosis, vasculitis, and leptomeningeal carcinomatosis.  Lumbar puncture was done which did not show any evidence of CSF pleocytosis, CSF ACE was normal, no malignant cells were seen on cytology.  Patient was started on olanzapine with improvement in hallucinations." During this admission, patient was started on folate replacement, taken off of Topamax as it can worsen mental status, and started on Depakote. She did have noted right upper extremity and jaw tremors that were more visible with increased patient confusion but without rigidity and was requested to follow up with Dr. Merlene Laughter outpatient, though there are no records for review of outpatient follow up.   ROS: A complete ROS was performed and is negative except as noted in the HPI.   Past Medical History:  Diagnosis Date   Arthritis    Lt arm   Gout    Hypertension    Past Surgical History:  Procedure Laterality Date   ABDOMINAL HYSTERECTOMY     TUBAL LIGATION     History reviewed. No pertinent family history.  Social History:   reports that she has quit smoking. She has never used  smokeless tobacco. She reports that she does not drink alcohol and does not use drugs.  Medications  Current Facility-Administered Medications:    acetaminophen (TYLENOL) tablet 650 mg, 650 mg, Oral, Q6H PRN **OR** acetaminophen (TYLENOL)  suppository 650 mg, 650 mg, Rectal, Q6H PRN, Zierle-Ghosh, Asia B, DO   albuterol (PROVENTIL) (2.5 MG/3ML) 0.083% nebulizer solution 2.5 mg, 2.5 mg, Inhalation, Q6H PRN, Zierle-Ghosh, Asia B, DO   [START ON 06/01/2021] cefTRIAXone (ROCEPHIN) 1 g in sodium chloride 0.9 % 100 mL IVPB, 1 g, Intravenous, Q24H, Zierle-Ghosh, Asia B, DO   clonazePAM (KLONOPIN) tablet 0.5 mg, 0.5 mg, Oral, BID PRN, Zierle-Ghosh, Asia B, DO   divalproex (DEPAKOTE) DR tablet 250 mg, 250 mg, Oral, Q12H, Zierle-Ghosh, Asia B, DO, 250 mg at 05/31/21 1032   hydrALAZINE (APRESOLINE) injection 10 mg, 10 mg, Intravenous, Q6H PRN, Alma Friendly, MD   hydrALAZINE (APRESOLINE) tablet 50 mg, 50 mg, Oral, BID, Horris Latino, Adline Peals, MD   labetalol (NORMODYNE) tablet 100 mg, 100 mg, Oral, BID, Zierle-Ghosh, Asia B, DO, 100 mg at 05/31/21 1032   ondansetron (ZOFRAN) tablet 4 mg, 4 mg, Oral, Q6H PRN **OR** ondansetron (ZOFRAN) injection 4 mg, 4 mg, Intravenous, Q6H PRN, Zierle-Ghosh, Asia B, DO  Exam: Current vital signs: BP (!) 134/100 (BP Location: Left Arm)    Pulse 82    Temp 98.2 F (36.8 C) (Oral)    Resp 20    Ht 5' (1.524 m)    Wt 72.6 kg    SpO2 99%    BMI 31.25 kg/m  Vital signs in last 24 hours: Temp:  [97.6 F (36.4 C)-99 F (37.2 C)] 98.2 F (36.8 C) (02/07 1703) Pulse Rate:  [42-83] 82 (02/07 1703) Resp:  [12-28] 20 (02/07 1703) BP: (118-189)/(63-100) 134/100 (02/07 1703) SpO2:  [92 %-99 %] 99 % (02/07 1703)  GENERAL: Awake, alert, in no acute distress Psych: Affect appropriate for situation, patient is calm and cooperative with examination Head: Normocephalic and family notes some mild left facial swelling and redness from baseline EENT: Normal conjunctivae, dry mucous membranes, no OP obstruction LUNGS: Normal respiratory effort. Non-labored breathing on room air CV: Regular rate and rhythm on telemetry ABDOMEN: Soft, non-tender, non-distended Extremities: Warm, well perfused, without obvious  deformity  NEURO:  Mental Status: Awake, alert, and oriented to person, place, time, and situation. She is able to provide a some history of present illness while missing many details per family at bedside Speech/Language: speech is intact without dysarthria    Naming, repetition, fluency, and comprehension intact without aphasia  No neglect is noted Cranial Nerves:  II: PERRL 3 mm/brisk. Visual fields full.  III, IV, VI: EOMI without gaze preference or nystagmus V: Sensation is intact to light touch and symmetrical to face.   VII: Face is symmetric resting and smiling.  VIII: Hearing is intact to voice IX, X: Palate elevation is symmetric. Phonation normal.  XI: Normal sternocleidomastoid and trapezius muscle strength XII: Tongue protrudes midline without fasciculations.   Motor: 5/5 strength present in bilateral lower extremities with minimal weakness of bilateral upper extremities.  Tone is normal. Bulk is normal.  Resting tremor that is present with arms outstretched.  Sensation: Intact to light touch bilaterally in all four extremities.  Coordination: FTN intact bilaterally. HKS intact bilaterally.  DTRs: 2+ and symmetric throughout.  Gait: Stable with 1 person stand-by assist.   Labs I have reviewed labs in epic and the results pertinent to this consultation are: CBC    Component Value Date/Time  WBC 4.1 05/31/2021 0243   RBC 4.45 05/31/2021 0243   HGB 12.0 05/31/2021 0243   HCT 36.9 05/31/2021 0243   PLT 150 05/31/2021 0243   MCV 82.9 05/31/2021 0243   MCH 27.0 05/31/2021 0243   MCHC 32.5 05/31/2021 0243   RDW 16.7 (H) 05/31/2021 0243   LYMPHSABS 1.8 05/31/2021 0243   MONOABS 0.5 05/31/2021 0243   EOSABS 0.2 05/31/2021 0243   BASOSABS 0.0 05/31/2021 0243   CMP     Component Value Date/Time   NA 144 05/30/2021 1838   K 3.6 05/30/2021 1838   CL 108 05/30/2021 1838   CO2 26 05/30/2021 1838   GLUCOSE 89 05/30/2021 1838   BUN 14 05/30/2021 1838   CREATININE  1.31 (H) 05/30/2021 1838   CALCIUM 9.7 05/30/2021 1838   PROT 6.4 (L) 05/30/2021 1838   ALBUMIN 3.7 05/30/2021 1838   AST 28 05/30/2021 1838   ALT 32 05/30/2021 1838   ALKPHOS 55 05/30/2021 1838   BILITOT 0.7 05/30/2021 1838   GFRNONAA 42 (L) 05/30/2021 1838   GFRAA 41 (L) 07/21/2019 2023   Lipid Panel     Component Value Date/Time   CHOL 170 11/02/2006 2302   TRIG 107 11/02/2006 2302   HDL 42 11/02/2006 2302   CHOLHDL 4.0 Ratio 11/02/2006 2302   VLDL 21 11/02/2006 2302   LDLCALC 107 (H) 11/02/2006 2302   No results found for: HGBA1C  Lab Results  Component Value Date   VITAMINB12 749 03/29/2021   Lab Results  Component Value Date   FOLATE 4.0 (L) 03/29/2021   Urinalysis    Component Value Date/Time   COLORURINE YELLOW 05/30/2021 2125   APPEARANCEUR HAZY (A) 05/30/2021 2125   LABSPEC 1.028 05/30/2021 2125   PHURINE 5.0 05/30/2021 2125   GLUCOSEU NEGATIVE 05/30/2021 2125   HGBUR MODERATE (A) 05/30/2021 2125   [place] NEGATIVE 05/30/2021 2125   KETONESUR 20 (A) 05/30/2021 2125   PROTEINUR NEGATIVE 05/30/2021 2125   NITRITE NEGATIVE 05/30/2021 2125   LEUKOCYTESUR MODERATE (A) 05/30/2021 2125   Imaging I have reviewed the images obtained:  CT-scan of the brain 2/6: 1. New small intraparenchymal hemorrhage in the inferior RIGHT frontal lobe. Finding new from recent MRI (05/17/2021). Favor posttraumatic hemorrhage. 2. No midline shift or mass effect. 3. No hydrocephalus.  Basal cisterns are patent.  CT angio head wwo 2/6: 1. Negative CTA of the head. No aneurysm or other vascular malformation identified. 2. Mild atheromatous change about the carotid siphons without hemodynamically significant or correctable stenosis.  Assessment: 81 y.o. female who presented to the ED with acute encephalopathy with intermittent lethargy, confusion, decreased PO intake, and multiple recent falls since June of 2022 with findings of a small right frontal IPH concerning for  posttraumatic hemorrhage.  - Examination reveals patient that is awake, alert, and oriented but with some confusion regarding past events and underreporting of her recent falls. She states she does not recall some of the falls that her family reports. She does have a resting tremor of bilateral upper extremities and jaw that appear consistent with EPS. Patient was started on Olanzapine in June of 2022 due to hallucinations and family reports concern with increased falls and tremors since this mediation was initiated. She does not have supporting documentation for Parkinson's disease and does not have examinations supporting rigidity.  - Patient's UA reveals moderate leukocytes and many bacteria which could contribute to her fluctuating mental status.  - Imaging revealed an acute small right IPH that is likely  post-traumatic and unlikely to be the etiology of patient's falls and confusion over the past 8 months. Patient has been recommended to follow up outpatient for further dementia evaluation without supportive documentation of follow up.   Recommendations:  - Discontinue olanzapine due to concern for EPS that may contribute to patient's tremors, increased lethargy, and falls - Initiate Seroquel 25 mg PO qhs for sleep and continue to monitor  - Treatment of possible UTI per primary team - At this time, with ongoing fluctuation of her mental status over the past 8 months and declining ability to care for herself, patient should have outpatient dementia evaluation.  - No further acute inpatient neurology recommendations at this time.  - Patient will need close outpatient follow up with her neurologist due to medication adjustments   Pt seen by NP/Neuro and later by MD. Note/plan to be edited by MD as needed.  Anibal Henderson, AGAC-NP [facility] Pager: [phone]  ATTENDING ATTESTATION:  Pt with tremors and falls. No evidence of idiopathic parkinsons on exam but maybe due to  medication induced movement. Med changes as above.  Trial of sinemet outpt if still warranted but this best diagnosed outpt.   Dr. Reeves Forth evaluated pt independently, reviewed imaging, chart, labs. Discussed and formulated plan with the APP. Please see APP note above for details.   Total 40 minutes spent on counseling patient and coordinating care, writing notes and reviewing chart.   Dannon Perlow,MD

## 2021-05-31 NOTE — TOC Initial Note (Addendum)
Transition of Care [facility]) - Initial/Assessment Note    Patient Details  Name: Allison Watson MRN: [id number] Date of Birth: Feb 16, 1941  Transition of Care [facility]) CM/SW Contact:    Geralynn Ochs, LCSW Phone Number: 05/31/2021, 1:48 PM  Clinical Narrative:          CSW met with patient to discuss [place], patient agreeable. [place] set up with [facility]. No other needs identified at this time.        UPDATE: [facility] not in network. Patient set up with [facility] for home health.   Expected Discharge Plan: [place] Barriers to Discharge: Continued Medical Work up   Patient Goals and CMS Choice Patient states their goals for this hospitalization and ongoing recovery are:: to get back home CMS Medicare.gov Compare Post Acute Care list provided to:: Patient Choice offered to / list presented to : Patient  Expected Discharge Plan and Services Expected Discharge Plan: [place] Choice: [place] arrangements for the past 2 months: Apartment                           HH Arranged: PT, Speech Therapy, Nurse's Aide HH Agency: [place] Date [facility] Agency Contacted: 05/31/21   Representative spoke with at [place]: [place] Arrangements/Services Living arrangements for the past 2 months: [place] with:: Self Patient language and need for interpreter reviewed:: No Do you feel safe going back to the place where you live?: Yes      Need for Family Participation in Patient Care: No (Comment) Care giver support system in place?: Yes (comment)   Criminal Activity/Legal Involvement Pertinent to Current Situation/Hospitalization: No - Comment as needed  Activities of Daily Living      Permission Sought/Granted Permission sought to share information with : Facility Art therapist granted to share information with : Yes, Verbal Permission Granted     Permission granted to  share info w AGENCY: HH        Emotional Assessment Appearance:: Appears stated age Attitude/Demeanor/Rapport: Engaged Affect (typically observed): Appropriate Orientation: : Oriented to Self, Oriented to Place, Oriented to  Time, Oriented to Situation Alcohol / Substance Use: Not Applicable Psych Involvement: No (comment)  Admission diagnosis:  ICH (intracerebral hemorrhage) ([place]) [I61.9] Generalized weakness [R53.1] Intraparenchymal hemorrhage of brain (HCC) [I61.9] Traumatic hemorrhage of right cerebrum with unknown loss of consciousness status, initial encounter [O12.24MG] Patient Active Problem List   Diagnosis Date Noted   Recurrent falls 05/31/2021   UTI (urinary tract infection) 05/31/2021   Intraparenchymal hemorrhage of brain ([place]) 05/30/2021   CKD (chronic kidney disease), stage IV ([place]) 50/06/7046   Toxic metabolic encephalopathy 88/91/6945   Altered mental status 10/11/2020   Obesity (BMI 30-39.9) 10/11/2020   Hypomagnesemia 10/11/2020   Normocytic anemia 10/11/2020   Anemia due to chronic kidney disease 10/11/2020   Psychosis ([place]) 03/88/8280   Acute metabolic encephalopathy 03/49/1791   INSOMNIA, CHRONIC 05/08/2007   BRONCHITIS, ACUTE WITH BRONCHOSPASM 04/16/2007   CELLULITIS/ABSCESS, TRUNK 10/31/2006   MUSCLE SPASM 10/31/2006   Essential hypertension 03/13/2006   ALLERGIC RHINITIS 03/13/2006   Osteoarthrosis, unspecified whether generalized or localized, unspecified site 03/13/2006   ARTHRITIS 03/13/2006   PCP:  Celene Squibb, MD Pharmacy:   [place], [place] - [address] [place] [address] Phone: [phone] Fax: [phone]  Social Determinants of Health (SDOH) Interventions    Readmission Risk Interventions No flowsheet data found.

## 2021-05-31 NOTE — Consult Note (Signed)
Reason for Consult:ich   Referring Physician: edp  Allison Watson is an 81 y.o. female.   HPI:  81 year old female who presented to [facility] emergency department last night with altered mental status.  Family reports that she has had several falls over the last 3 days.  There is a significant history of gout hypertension chronic knee disease.  She was apparently fairly lethargic and sleeping all day yesterday.  She was recently started on medications for sleeping.  Denies any headaches.  She seems to be back to baseline today.  She is alert and oriented x4.  Denies any headaches, nausea vomiting dizziness or vision changes.  Past Medical History:  Diagnosis Date   Arthritis    Lt arm   Gout    Hypertension     Past Surgical History:  Procedure Laterality Date   ABDOMINAL HYSTERECTOMY     TUBAL LIGATION      Allergies  Allergen Reactions   Codeine Rash   Lorazepam Other (See Comments)    Hallucinations per daughter [name]   Penicillins Rash    Has patient had a PCN reaction causing immediate rash, facial/tongue/throat swelling, SOB or lightheadedness with hypotension: Yes Has patient had a PCN reaction causing severe rash involving mucus membranes or skin necrosis: No Has patient had a PCN reaction that required hospitalization No Has patient had a PCN reaction occurring within the last 10 years: No If all of the above answers are "NO", then may proceed with Cephalosporin use.     Social History   Tobacco Use   Smoking status: Former   Smokeless tobacco: Never  Substance Use Topics   Alcohol use: No    History reviewed. No pertinent family history.   Review of Systems  Positive ROS: as above  All other systems have been reviewed and were otherwise negative with the exception of those mentioned in the HPI and as above.  Objective: Vital signs in last 24 hours: Temp:  [97.6 F (36.4 C)-99.1 F (37.3 C)] 97.6 F (36.4 C) (02/07 0733) Pulse Rate:  [42-83] 73  (02/07 0733) Resp:  [12-28] 20 (02/07 0733) BP: (118-189)/(63-109) 157/88 (02/07 0733) SpO2:  [92 %-99 %] 97 % (02/07 0733) Weight:  [72.6 kg] 72.6 kg (02/06 1525)  General Appearance: Alert, cooperative, no distress, appears stated age Head: Normocephalic, without obvious abnormality, atraumatic Eyes: PERRL, conjunctiva/corneas clear, EOM's intact, fundi benign, both eyes      Lungs:respirations unlabored Heart: Regular rate and rhythm Pulses: 2+ and symmetric all extremities Skin: Skin color, texture, turgor normal, no rashes or lesions  NEUROLOGIC:   Mental status: A&O x4, no aphasia, good attention span, Memory and fund of knowledge Motor Exam - grossly normal, normal tone and bulk, resting tremors Sensory Exam - grossly normal Reflexes: symmetric, no pathologic reflexes, No Hoffman's, No clonus Coordination - grossly normal Gait - no tested Balance -not tested  cranial Nerves: I: smell Not tested  II: visual acuity  OS: na    OD: na  II: visual fields Full to confrontation  II: pupils Equal, round, reactive to light  III,VII: ptosis None  III,IV,VI: extraocular muscles  Full ROM  V: mastication Normal  V: facial light touch sensation  Normal  V,VII: corneal reflex  Present  VII: facial muscle function - upper  Normal  VII: facial muscle function - lower Normal  VIII: hearing Not tested  IX: soft palate elevation  Normal  IX,X: gag reflex Present  XI: trapezius strength  5/5  XI: sternocleidomastoid strength 5/5  XI: neck flexion strength  5/5  XII: tongue strength  Normal    Data Review Lab Results  Component Value Date   WBC 4.1 05/31/2021   HGB 12.0 05/31/2021   HCT 36.9 05/31/2021   MCV 82.9 05/31/2021   PLT 150 05/31/2021   Lab Results  Component Value Date   NA 144 05/30/2021   K 3.6 05/30/2021   CL 108 05/30/2021   CO2 26 05/30/2021   BUN 14 05/30/2021   CREATININE 1.31 (H) 05/30/2021   GLUCOSE 89 05/30/2021   Lab Results  Component Value  Date   INR 1.1 10/11/2020    Radiology: CT Angio Head W or Wo Contrast  Result Date: 05/30/2021 CLINICAL DATA:  Initial evaluation for cerebral aneurysm screening, high risk. EXAM: CT ANGIOGRAPHY HEAD TECHNIQUE: Multidetector CT imaging of the head was performed using the standard protocol during bolus administration of intravenous contrast. Multiplanar CT image reconstructions and MIPs were obtained to evaluate the vascular anatomy. RADIATION DOSE REDUCTION: This exam was performed according to the departmental dose-optimization program which includes automated exposure control, adjustment of the mA and/or kV according to patient size and/or use of iterative reconstruction technique. CONTRAST:  25mL OMNIPAQUE IOHEXOL 350 MG/ML SOLN COMPARISON:  Prior CT from earlier the same day. FINDINGS: CTA HEAD Anterior circulation: Visualized distal cervical segments of the internal carotid arteries are patent bilaterally. Distal cervical right ICA tortuous. Petrous segments widely patent. Scattered atheromatous plaque within the carotid siphons with no more than mild multifocal narrowing. A1 segments widely patent. Normal anterior communicating artery complex. Anterior cerebral arteries patent to their distal aspects. No M1 stenosis or occlusion. Normal MCA bifurcations. Distal MCA branches well perfused and symmetric. Posterior circulation: Both vertebral arteries patent to the vertebrobasilar junction without stenosis. Right vertebral artery dominant. Both PICA origins patent and normal. Basilar patent to its distal aspect without stenosis. Superior cerebellar arteries patent bilaterally. Both PCAs primarily supplied via the basilar well perfused or distal aspects. Venous sinuses: Patent allowing for timing the contrast bolus. Anatomic variants: None significant. No intracranial aneurysm or other vascular malformation. Review of the MIP images confirms the above findings. IMPRESSION: 1. Negative CTA of the head. No  aneurysm or other vascular malformation identified. 2. Mild atheromatous change about the carotid siphons without hemodynamically significant or correctable stenosis. Electronically Signed   By: Jeannine Boga M.D.   On: 05/30/2021 21:28   DG Chest 2 View  Result Date: 05/30/2021 CLINICAL DATA:  Cough. EXAM: CHEST - 2 VIEW COMPARISON:  Chest x-ray 03/29/2021 FINDINGS: Heart is mildly enlarged. The lungs and costophrenic angles are clear. There is no pneumothorax or acute fracture. IMPRESSION: 1. No acute cardiopulmonary process. 2. Stable cardiomegaly. Electronically Signed   By: Ronney Asters M.D.   On: 05/30/2021 19:04   CT HEAD WO CONTRAST (5MM)  Result Date: 05/30/2021 CLINICAL DATA:  Mental status change.  Increased weakness EXAM: CT HEAD WITHOUT CONTRAST TECHNIQUE: Contiguous axial images were obtained from the base of the skull through the vertex without intravenous contrast. RADIATION DOSE REDUCTION: This exam was performed according to the departmental dose-optimization program which includes automated exposure control, adjustment of the mA and/or kV according to patient size and/or use of iterative reconstruction technique. COMPARISON:  Brain MRI 05/17/2021 FINDINGS: Brain: Within the inferior RIGHT frontal lobe there is a new hypodensity within the white matter and cerebral cortex (image 13/2 and image 21/5). This region of involvement measures approximately 1.5 x 2 cm. No lesion  present on recent MRI of the brain therefore findings favored acute parenchymal hemorrhage. Additional intracranial hemorrhage evident. No midline shift or mass effect. No intraventricular hemorrhage. No extra-axial fluid. Vascular: No hyperdense vessel or unexpected calcification. Skull: Normal. Negative for fracture or focal lesion. Sinuses/Orbits: No acute finding. Other: None. IMPRESSION: 1. New small intraparenchymal hemorrhage in the inferior RIGHT frontal lobe. Finding new from recent MRI (05/17/2021). Favor  posttraumatic hemorrhage. 2. No midline shift or mass effect. 3. No hydrocephalus.  Basal cisterns are patent. Critical Value/emergent results were called by telephone at the time of interpretation on 05/30/2021 at 7:01 pm to provider JOSHUA LONG , who verbally acknowledged these results. Electronically Signed   By: Suzy Bouchard M.D.   On: 05/30/2021 19:01     Assessment/Plan: 81 year old patient came into the ED last night with a chief complaint of altered mental status per family.  CT head showed a small intraparenchymal hemorrhage in the right frontal lobe.  No midline shift or mass effect.  No neurosurgical intervention is warranted at this time.  Continue neurochecks per unit.  Would also recommend neurology consult for her tremors.  She reports she has never been diagnosed with Parkinson's.  No need for follow-up imaging unless patient has a decline in neurological function.  We will sign off at this time.  Please call with any concerns or questions.   Ocie Cornfield Sharel Behne 05/31/2021 8:40 AM

## 2021-05-31 NOTE — Plan of Care (Signed)
  Problem: Nutrition: Goal: Risk of aspiration will decrease Outcome: Progressing   

## 2021-05-31 NOTE — Evaluation (Signed)
Speech Language Pathology Evaluation Patient Details Name: Allison Watson MRN: [id number] DOB: 1940-07-24 Today's Date: 05/31/2021 Time: 1200-1220 SLP Time Calculation (min) (ACUTE ONLY): 20 min  Problem List:  Patient Active Problem List   Diagnosis Date Noted   Recurrent falls 05/31/2021   UTI (urinary tract infection) 05/31/2021   Intraparenchymal hemorrhage of brain ([place]) 05/30/2021   CKD (chronic kidney disease), stage IV ([place]) 03/54/6568   Toxic metabolic encephalopathy 12/75/1700   Altered mental status 10/11/2020   Obesity (BMI 30-39.9) 10/11/2020   Hypomagnesemia 10/11/2020   Normocytic anemia 10/11/2020   Anemia due to chronic kidney disease 10/11/2020   Psychosis ([place]) 17/49/4496   Acute metabolic encephalopathy 75/91/6384   INSOMNIA, CHRONIC 05/08/2007   BRONCHITIS, ACUTE WITH BRONCHOSPASM 04/16/2007   CELLULITIS/ABSCESS, TRUNK 10/31/2006   MUSCLE SPASM 10/31/2006   Essential hypertension 03/13/2006   ALLERGIC RHINITIS 03/13/2006   Osteoarthrosis, unspecified whether generalized or localized, unspecified site 03/13/2006   ARTHRITIS 03/13/2006   Past Medical History:  Past Medical History:  Diagnosis Date   Arthritis    Lt arm   Gout    Hypertension    Past Surgical History:  Past Surgical History:  Procedure Laterality Date   ABDOMINAL HYSTERECTOMY     TUBAL LIGATION     HPI:  81 y.o. female presenting to ED 2/6 with AMS, poor p.o. intake, generalized weakness and recurrent falls x3 in 2 days. Imaging (+) for small intraparenchymal hemorrhage. No intervention required per neurology. UA (+) UTI. PMHx significant for HTN, insomnia recently started on olanzapine, gout and arthritis.   Assessment / Plan / Recommendation Clinical Impression  Pt presents with cognitive impairments that per chart appear to be acute on chronic (family not present to establish a more specific baseline). She is disoriented to location and situation, and has reduced emergent and  safety awareness. Cueing is needed for completion of one-step commands, and although she has reduced recall of new information, she does start to show some carryover of information when given repetition during this evaluation. She also exhibits difficulty with sustained attention and simple problem solving. She would benefit from increased assistance upon initial return home as well as OP SLP f/u for cognitive therapy as this is a more acute change.    SLP Assessment  SLP Recommendation/Assessment: Patient needs continued Speech [place] Pathology Services SLP Visit Diagnosis: Cognitive communication deficit (R41.841)    Recommendations for follow up therapy are one component of a multi-disciplinary discharge planning process, led by the attending physician.  Recommendations may be updated based on patient status, additional functional criteria and insurance authorization.    Follow Up Recommendations  Home Health SLP vs Outpatient SLP    Assistance Recommended at Discharge  Frequent or constant Supervision/Assistance  Functional Status Assessment Patient has had a recent decline in their functional status and demonstrates the ability to make significant improvements in function in a reasonable and predictable amount of time.  Frequency and Duration min 2x/week  2 weeks      SLP Evaluation Cognition  Overall Cognitive Status: No family/caregiver present to determine baseline cognitive functioning Arousal/Alertness: Awake/alert Orientation Level: Oriented to person;Disoriented to place;Disoriented to situation Attention: Sustained Sustained Attention: Impaired Sustained Attention Impairment: Verbal basic;Functional basic Memory: Impaired Memory Impairment: Decreased recall of new information Awareness: Impaired Awareness Impairment: Emergent impairment Problem Solving: Impaired Problem Solving Impairment: Functional basic Safety/Judgment: Impaired       Comprehension  Auditory  Comprehension Overall Auditory Comprehension: Impaired Commands: Impaired One Step Basic Commands: 50-74% accurate  Conversation: Simple    Expression Expression Primary Mode of Expression: Verbal Verbal Expression Overall Verbal Expression: Appears within functional limits for tasks assessed Written Expression Dominant Hand: Right   Oral / Motor  Oral Motor/Sensory Function Overall Oral Motor/Sensory Function: Other (comment) (tremors of jaw, tongue) Motor Speech Overall Motor Speech: Appears within functional limits for tasks assessed             Osie Bond., M.A. [place] [facility] Services Pager [phone] Office [phone]  05/31/2021, 2:09 PM

## 2021-05-31 NOTE — Care Management Obs Status (Signed)
[place] NOTIFICATION   Patient Details  Name: Allison Watson MRN: [id number] Date of Birth: 1940/06/10   Medicare Observation Status Notification Given:  Yes    Geralynn Ochs, LCSW 05/31/2021, 1:35 PM

## 2021-05-31 NOTE — Evaluation (Signed)
Physical Therapy Evaluation Patient Details Name: Allison Watson MRN: [id number] DOB: [birth year] Today's Date: 05/31/2021  History of Present Illness  81 y.o. female presenting to ED 2/6 with AMS, poor p.o. intake, generalized weakness and recurrent falls x3 in 2 days. Imaging (+) for small intraparenchymal hemorrhage. No intervention required per neurology. UA (+) UTI. PMHx significant for HTN, insomnia recently started on olanzapine, gout and arthritis.    Clinical Impression  Allison Watson is 81 y.o. female admitted with above HPI and diagnosis. Patient is currently limited by functional impairments below (see PT problem list). Patient lives alone but has assist from her daughter regularly as she works form home. PTA pt reports she was independent with mobility and had assist from family for ADL's occasionally and her children provide transportation. She has had multiple falls and has noted a decline in her balance. Patient has noticeable tremor on Rt hand and face, she has overall reduced step length on Rt LE and gait is shuffled with pauses required for motor planing to negotiate obstacle like steps. Overall she required in guard/assist for transfers and gait this date with no device and occasional HHA. Patient will benefit from continued skilled PT interventions to address impairments and progress independence with mobility, recommending OPPT for neuro-rehab/balance with return home if family can provide 24/7 assist initially. Acute PT will follow and progress as able.        Recommendations for follow up therapy are one component of a multi-disciplinary discharge planning process, led by the attending physician.  Recommendations may be updated based on patient status, additional functional criteria and insurance authorization.  Follow Up Recommendations Outpatient PT    Assistance Recommended at Discharge Frequent or constant Supervision/Assistance  Patient can return home with the  following  A little help with walking and/or transfers;A little help with bathing/dressing/bathroom;Assistance with cooking/housework;Assist for transportation;Help with stairs or ramp for entrance    Equipment Recommendations None recommended by PT  Recommendations for Other Services       Functional Status Assessment Patient has had a recent decline in their functional status and demonstrates the ability to make significant improvements in function in a reasonable and predictable amount of time.     Precautions / Restrictions Precautions Precautions: Fall Precaution Comments: Hx of falls; R hand resting tremor; facial tremor; bradykinesia; UTI Restrictions Weight Bearing Restrictions: No      Mobility  Bed Mobility               General bed mobility comments: pt OOB in recliner at start. pt attempting to get up from recliner and cues needed for safety.    Transfers Overall transfer level: Needs assistance Equipment used: None Transfers: Sit to/from Stand Sit to Stand: Min guard           General transfer comment: guarding for rise from recliner and pt steady once standing with no device.    Ambulation/Gait Ambulation/Gait assistance: Min guard, Min assist Gait Distance (Feet): 120 Feet Assistive device: None Gait Pattern/deviations: Step-through pattern, Decreased stride length, Drifts right/left, Decreased step length - right, Decreased dorsiflexion - right, Decreased dorsiflexion - left, Festinating Gait velocity: decr     General Gait Details: pt with festinating gait and shuffled steps step length and dorsiflexion improved as pt continued ambulated but Rt step length remained slightly decreased from Lt. Patient required min  Stairs Stairs: Yes Stairs assistance: Min assist Stair Management: No rails, Step to pattern, Forwards Number of Stairs: 1 General stair comments: pt  required HHA/min assist for safety with curb negotiation. pt stopping at base of  step and at top of step taking extra time to plan step up/down with Rt LE. pt requried increased assist for controlled lowering and to prevent LOB with descent.  Wheelchair Mobility    Modified Rankin (Stroke Patients Only)       Balance Overall balance assessment: Mild deficits observed, not formally tested                                           Pertinent Vitals/Pain Pain Assessment Pain Assessment: No/denies pain    Home Living Family/patient expects to be discharged to:: Private residence Living Arrangements: Alone Available Help at Discharge: Family;Available PRN/intermittently Type of Home: Apartment Home Access: Level entry       Home Layout: One level Home Equipment: None      Prior Function Prior Level of Function : Independent/Modified Independent             Mobility Comments: Ambulates in home and community dwellings without AD. ADLs Comments: Mod I for ADLs at baseline; recently requiring assist for ADLs/IADLs. 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 Children assist with meal prep, housekeeping, money management and med management.     Hand Dominance   Dominant Hand: Right    Extremity/Trunk Assessment   Upper Extremity Assessment Upper Extremity Assessment: Defer to OT evaluation    Lower Extremity Assessment Lower Extremity Assessment: Generalized weakness (4/5 or less for LE strength throughout, pt equal bil.)    Cervical / Trunk Assessment Cervical / Trunk Assessment: Normal  Communication   Communication: No difficulties  Cognition Arousal/Alertness: Awake/alert Behavior During Therapy: Flat affect Overall Cognitive Status: No family/caregiver present to determine baseline cognitive functioning Area of Impairment: Memory, Problem solving, Safety/judgement                     Memory: Decreased short-term memory   Safety/Judgement: Decreased awareness of safety, Decreased awareness of deficits   Problem  Solving: Difficulty sequencing, Requires verbal cues General Comments: pt pleasant and A&Ox4. pt with some decresaed awareness of deficits and cues needed for safety.        General Comments      Exercises     Assessment/Plan    PT Assessment Patient needs continued PT services  PT Problem List Decreased strength;Decreased activity tolerance;Decreased balance;Decreased mobility;Decreased safety awareness;Decreased knowledge of use of DME;Decreased knowledge of precautions       PT Treatment Interventions DME instruction;Gait training;Stair training;Functional mobility training;Therapeutic activities;Therapeutic exercise;Balance training;Neuromuscular re-education;Patient/family education    PT Goals (Current goals can be found in the Care Plan section)  Acute Rehab PT Goals Patient Stated Goal: get home and back to normal independent routine PT Goal Formulation: With patient Time For Goal Achievement: 06/14/21 Potential to Achieve Goals: Good    Frequency Min 4X/week     Co-evaluation               AM-PAC PT "6 Clicks" Mobility  Outcome Measure Help needed turning from your back to your side while in a flat bed without using bedrails?: A Little Help needed moving from lying on your back to sitting on the side of a flat bed without using bedrails?: A Little Help needed moving to and from a bed to a chair (including a wheelchair)?: A Little Help needed standing up from a chair  using your arms (e.g., wheelchair or bedside chair)?: A Little Help needed to walk in hospital room?: A Little Help needed climbing 3-5 steps with a railing? : A Little 6 Click Score: 18    End of Session Equipment Utilized During Treatment: Gait belt Activity Tolerance: Patient tolerated treatment well Patient left: in chair;with call bell/phone within reach;with chair alarm set Nurse Communication: Mobility status PT Visit Diagnosis: Unsteadiness on feet (R26.81);Muscle weakness  (generalized) (M62.81);History of falling (Z91.81);Difficulty in walking, not elsewhere classified (R26.2);Other symptoms and signs involving the nervous system (Z61.096)    Time: 0454-0981 PT Time Calculation (min) (ACUTE ONLY): 19 min   Charges:   PT Evaluation $PT Eval Moderate Complexity: 1 Mod          Verner Mould, DPT Acute Rehabilitation Services Office [phone] Pager [phone]   Jacques Navy 05/31/2021, 1:37 PM

## 2021-06-01 DIAGNOSIS — I619 Nontraumatic intracerebral hemorrhage, unspecified: Secondary | ICD-10-CM | POA: Diagnosis not present

## 2021-06-01 DIAGNOSIS — I10 Essential (primary) hypertension: Secondary | ICD-10-CM | POA: Diagnosis not present

## 2021-06-01 DIAGNOSIS — G9341 Metabolic encephalopathy: Secondary | ICD-10-CM | POA: Diagnosis not present

## 2021-06-01 DIAGNOSIS — R531 Weakness: Secondary | ICD-10-CM

## 2021-06-01 LAB — CBC WITH DIFFERENTIAL/PLATELET
Abs Immature Granulocytes: 0.01 10*3/uL (ref 0.00–0.07)
Basophils Absolute: 0 10*3/uL (ref 0.0–0.1)
Basophils Relative: 1 %
Eosinophils Absolute: 0.3 10*3/uL (ref 0.0–0.5)
Eosinophils Relative: 5 %
HCT: 33.8 % — ABNORMAL LOW (ref 36.0–46.0)
Hemoglobin: 11 g/dL — ABNORMAL LOW (ref 12.0–15.0)
Immature Granulocytes: 0 %
Lymphocytes Relative: 45 %
Lymphs Abs: 2.3 10*3/uL (ref 0.7–4.0)
MCH: 26.8 pg (ref 26.0–34.0)
MCHC: 32.5 g/dL (ref 30.0–36.0)
MCV: 82.2 fL (ref 80.0–100.0)
Monocytes Absolute: 0.5 10*3/uL (ref 0.1–1.0)
Monocytes Relative: 11 %
Neutro Abs: 2 10*3/uL (ref 1.7–7.7)
Neutrophils Relative %: 38 %
Platelets: 156 10*3/uL (ref 150–400)
RBC: 4.11 MIL/uL (ref 3.87–5.11)
RDW: 16.4 % — ABNORMAL HIGH (ref 11.5–15.5)
WBC: 5.1 10*3/uL (ref 4.0–10.5)
nRBC: 0 % (ref 0.0–0.2)

## 2021-06-01 LAB — BASIC METABOLIC PANEL
Anion gap: 12 (ref 5–15)
BUN: 12 mg/dL (ref 8–23)
CO2: 23 mmol/L (ref 22–32)
Calcium: 9.3 mg/dL (ref 8.9–10.3)
Chloride: 108 mmol/L (ref 98–111)
Creatinine, Ser: 1.48 mg/dL — ABNORMAL HIGH (ref 0.44–1.00)
GFR, Estimated: 36 mL/min — ABNORMAL LOW (ref >60)
Glucose, Bld: 92 mg/dL (ref 70–99)
Potassium: 3.4 mmol/L — ABNORMAL LOW (ref 3.5–5.1)
Sodium: 143 mmol/L (ref 135–145)

## 2021-06-01 MED ORDER — QUETIAPINE FUMARATE 25 MG PO TABS: 12.5000 mg | ORAL_TABLET | Freq: Every day | ORAL | Status: DC

## 2021-06-01 MED ORDER — HYDRALAZINE HCL 50 MG PO TABS: 50.0000 mg | ORAL_TABLET | Freq: Three times a day (TID) | ORAL | 1 refills | Status: DC

## 2021-06-01 MED ORDER — CEPHALEXIN 500 MG PO CAPS: 500.0000 mg | ORAL_CAPSULE | Freq: Two times a day (BID) | ORAL | 0 refills | Status: AC

## 2021-06-01 MED ORDER — QUETIAPINE FUMARATE 25 MG PO TABS: 12.5000 mg | ORAL_TABLET | Freq: Every day | ORAL | 0 refills | Status: DC

## 2021-06-01 NOTE — Progress Notes (Signed)
Pt discharged to home with grandson at her side, transportation provided by grandson. Teaching/goals all met. All belongings, discharge information with scheduled appointments/instructions in packet placed in discharge bag, and given to patient and grandson

## 2021-06-01 NOTE — TOC Transition Note (Signed)
Transition of Care [facility]) - CM/SW Discharge Note   Patient Details  Name: Allison Watson MRN: [id number] Date of Birth: 1940/12/25  [facility] of [facility]) CM/SW Contact:  Geralynn Ochs, LCSW Phone Number: 06/01/2021, 1:20 PM   Clinical Narrative:   Patient being discharged home today. CSW notified [facility] of discharge home. Information for home health placed on AVS. No other needs identified.    Final next level of care: [place] Barriers to Discharge: Barriers Resolved   Patient Goals and CMS Choice Patient states their goals for this hospitalization and ongoing recovery are:: to get back home CMS Medicare.gov Compare Post Acute Care list provided to:: Patient Choice offered to / list presented to : Patient  Discharge Placement                Patient to be transferred to facility by: Family Name of family member notified: Self Patient and family notified of of transfer: 06/01/21  Discharge Plan and Services     Post Acute Care Choice: Home Health                    HH Arranged: PT, Speech Therapy [place] Agency: [place] Date Secretary: 06/01/21   Representative spoke with at [place]: [place]) Interventions     Readmission Risk Interventions No flowsheet data found.

## 2021-06-01 NOTE — Progress Notes (Signed)
Completed patient's dc instructions. Spoke with patient about dc needs and transportation home. Attempted to call both patient's son and daughter. Was unsuccessful.   Patient's RN was notified.

## 2021-06-01 NOTE — Discharge Summary (Signed)
Physician Discharge Summary   Patient: Allison Watson MRN: [id number] DOB: 06/12/1940  Admit date:     05/30/2021  Discharge date: 06/01/21  Discharge Physician: Hosie Poisson   PCP: Celene Squibb, MD   Recommendations at discharge:    Please follow up with PCP in one week.  Please follow up with Neurology in 2 weeks for dementia work up and parkinson's disease work up.    Discharge Diagnoses: Principal Problem:   Intraparenchymal hemorrhage of brain [facility]) Active Problems:   Essential hypertension   Acute metabolic encephalopathy   Recurrent falls   UTI (urinary tract infection)  Resolved Problems:   ICH (intracerebral hemorrhage) [facility])   Hospital Course:  Allison Watson is a 81 y.o. female with medical history significant of arthritis, gout, HTN, CKD, SDH, presents to the ED with a chief complaint of AMS. Family reports that patient was lethargic, sleeping all day, decreased p.o. intake, and falls for 3 days.  She had similar occurrence 2 months ago. She was completely independent of all her ADLs for a month, then for last 2 to 3 days she has been totally different per the report.  They report she was recently started on olanzapine for insomnia last week. She has complained of dizziness, she has had generalized weakness, and she has required help with her ADLs during these last 2 to 3 days. Pt is alert and oriented x3.  Patient denies any other new complaints.  In the ED, CT head showed a small intraparenchymal hemorrhage in the right frontal lobe.  Neurosurgery consulted, recommended no neurosurgical intervention and no need for follow-up imaging unless a decline in neurological function. Patient [facility] from [facility] for further evaluation.  Patient admitted for further management.  Assessment and Plan: * Intraparenchymal hemorrhage of brain ([place])- (present on admission) Patient has had 2-3 falls in the last week -this is likely the etiology behind her  intraparenchymal hemorrhage Neurosurgery reviewed the scan reports that the bleeding is so small there is no intervention required at this time. Advised transferring to [facility], neurochecks, systolic blood pressure less than 150 ED physician had started Cleviprex, but it was discontinued at the recommendation of the neurosurgeon   UTI (urinary tract infection)- (present on admission) UA is indicative of UTI Urine culture ordered , was started on IV rocephin , transitioned to oral keflex on discharge.   Recurrent falls Likely multifactorial secondary to infection and polypharmacy Fall started shortly after olanzapine was started Hold olanzapine Treat infection as above PT eval and treat  Acute metabolic encephalopathy- (present on admission) Likely multifactorial, with polypharmacy, infection, intraparenchymal hemorrhage all contributing Holding olanzapine. Resume home meds and added seroquel as per neurology recommendations.  Treat hemorrhage and UTI as above Reorient frequently Patient is currently alert and oriented x3 during my exam Continue to monitor  Essential hypertension- (present on admission) Better controlled. Resume home meds.     Hypokalemia Replaced.       Consultants: neurology       Procedures performed: none.   Disposition: Home Diet recommendation:  Regular diet  DISCHARGE MEDICATION: Allergies as of 06/01/2021       Reactions   Codeine Rash   Lorazepam Other (See Comments)   Hallucinations per daughter [name]   Penicillins Rash   Has patient had a PCN reaction causing immediate rash, facial/tongue/throat swelling, SOB or lightheadedness with hypotension: Yes Has patient had a PCN reaction causing severe rash involving mucus membranes or skin necrosis:  No Has patient had a PCN reaction that required hospitalization No Has patient had a PCN reaction occurring within the last 10 years: No If all of the above answers are "NO", then may proceed  with Cephalosporin use.        Medication List     STOP taking these medications    OLANZapine 10 MG tablet Commonly known as: ZYPREXA       TAKE these medications    albuterol 108 (90 Base) MCG/ACT inhaler Commonly known as: VENTOLIN HFA Inhale 1-2 puffs into the lungs every 6 (six) hours as needed for wheezing or shortness of breath. Will use when too hot/ too cold outside   cephALEXin 500 MG capsule Commonly known as: KEFLEX Take 1 capsule (500 mg total) by mouth 2 (two) times daily for 5 days.   cholecalciferol 25 MCG (1000 UNIT) tablet Commonly known as: VITAMIN D3 Take 1,000 Units by mouth daily.   clonazePAM 1 MG tablet Commonly known as: KLONOPIN Take 1 mg by mouth 2 (two) times daily as needed.   cyanocobalamin 1000 MCG/ML injection Commonly known as: (VITAMIN B-12) Inject 1,000 mcg into the skin every 30 (thirty) days.   divalproex 250 MG DR tablet Commonly known as: DEPAKOTE Take 1 tablet (250 mg total) by mouth every 12 (twelve) hours.   hydrALAZINE 50 MG tablet Commonly known as: APRESOLINE Take 1 tablet (50 mg total) by mouth 3 (three) times daily. What changed:  medication strength how much to take when to take this   labetalol 100 MG tablet Commonly known as: NORMODYNE Take 1 tablet (100 mg total) by mouth 2 (two) times daily.   multivitamin tablet Take 1 tablet by mouth daily.   QUEtiapine 25 MG tablet Commonly known as: SEROQUEL Take 0.5 tablets (12.5 mg total) by mouth at bedtime.        Follow-up Information     [facility] [place] Follow up.   Specialty: Home Health Services Why: Representative from [facility] will contact you to schedule start of home health services. Contact information: [place] [address] [phone]                 Discharge Exam: Danley Danker Weights   05/30/21 1525  Weight: 72.6 kg     Condition at discharge: good  The results of significant diagnostics from  this hospitalization (including imaging, microbiology, ancillary and laboratory) are listed below for reference.   Imaging Studies: CT Angio Head W or Wo Contrast  Result Date: 05/30/2021 CLINICAL DATA:  Initial evaluation for cerebral aneurysm screening, high risk. EXAM: CT ANGIOGRAPHY HEAD TECHNIQUE: Multidetector CT imaging of the head was performed using the standard protocol during bolus administration of intravenous contrast. Multiplanar CT image reconstructions and MIPs were obtained to evaluate the vascular anatomy. RADIATION DOSE REDUCTION: This exam was performed according to the departmental dose-optimization program which includes automated exposure control, adjustment of the mA and/or kV according to patient size and/or use of iterative reconstruction technique. CONTRAST:  41mL OMNIPAQUE IOHEXOL 350 MG/ML SOLN COMPARISON:  Prior CT from earlier the same day. FINDINGS: CTA HEAD Anterior circulation: Visualized distal cervical segments of the internal carotid arteries are patent bilaterally. Distal cervical right ICA tortuous. Petrous segments widely patent. Scattered atheromatous plaque within the carotid siphons with no more than mild multifocal narrowing. A1 segments widely patent. Normal anterior communicating artery complex. Anterior cerebral arteries patent to their distal aspects. No M1 stenosis or occlusion. Normal MCA bifurcations. Distal MCA branches  well perfused and symmetric. Posterior circulation: Both vertebral arteries patent to the vertebrobasilar junction without stenosis. Right vertebral artery dominant. Both PICA origins patent and normal. Basilar patent to its distal aspect without stenosis. Superior cerebellar arteries patent bilaterally. Both PCAs primarily supplied via the basilar well perfused or distal aspects. Venous sinuses: Patent allowing for timing the contrast bolus. Anatomic variants: None significant. No intracranial aneurysm or other vascular malformation. Review of  the MIP images confirms the above findings. IMPRESSION: 1. Negative CTA of the head. No aneurysm or other vascular malformation identified. 2. Mild atheromatous change about the carotid siphons without hemodynamically significant or correctable stenosis. Electronically Signed   By: Jeannine Boga M.D.   On: 05/30/2021 21:28   DG Chest 2 View  Result Date: 05/30/2021 CLINICAL DATA:  Cough. EXAM: CHEST - 2 VIEW COMPARISON:  Chest x-ray 03/29/2021 FINDINGS: Heart is mildly enlarged. The lungs and costophrenic angles are clear. There is no pneumothorax or acute fracture. IMPRESSION: 1. No acute cardiopulmonary process. 2. Stable cardiomegaly. Electronically Signed   By: Ronney Asters M.D.   On: 05/30/2021 19:04   CT HEAD WO CONTRAST (5MM)  Result Date: 05/30/2021 CLINICAL DATA:  Mental status change.  Increased weakness EXAM: CT HEAD WITHOUT CONTRAST TECHNIQUE: Contiguous axial images were obtained from the base of the skull through the vertex without intravenous contrast. RADIATION DOSE REDUCTION: This exam was performed according to the departmental dose-optimization program which includes automated exposure control, adjustment of the mA and/or kV according to patient size and/or use of iterative reconstruction technique. COMPARISON:  Brain MRI 05/17/2021 FINDINGS: Brain: Within the inferior RIGHT frontal lobe there is a new hypodensity within the white matter and cerebral cortex (image 13/2 and image 21/5). This region of involvement measures approximately 1.5 x 2 cm. No lesion present on recent MRI of the brain therefore findings favored acute parenchymal hemorrhage. Additional intracranial hemorrhage evident. No midline shift or mass effect. No intraventricular hemorrhage. No extra-axial fluid. Vascular: No hyperdense vessel or unexpected calcification. Skull: Normal. Negative for fracture or focal lesion. Sinuses/Orbits: No acute finding. Other: None. IMPRESSION: 1. New small intraparenchymal  hemorrhage in the inferior RIGHT frontal lobe. Finding new from recent MRI (05/17/2021). Favor posttraumatic hemorrhage. 2. No midline shift or mass effect. 3. No hydrocephalus.  Basal cisterns are patent. Critical Value/emergent results were called by telephone at the time of interpretation on 05/30/2021 at 7:01 pm to provider JOSHUA LONG , who verbally acknowledged these results. Electronically Signed   By: Suzy Bouchard M.D.   On: 05/30/2021 19:01   MR BRAIN W WO CONTRAST  Addendum Date: 05/18/2021   ADDENDUM REPORT: 05/18/2021 16:41 ADDENDUM: Comparison made with the MRI of 10/05/2020 revealing abnormality in the right parietal lobe. This abnormality has resolved and now appears normal. The sulcal FLAIR hyperintensity has resolved. Subarachnoid enhancement also has resolved. This may have been due to infection, hemorrhage, seizure disorder or other inflammatory process in the subarachnoid space. Electronically Signed   By: Franchot Gallo M.D.   On: 05/18/2021 16:41   Result Date: 05/18/2021 CLINICAL DATA:  Localized swelling, mass, and lump of head. EXAM: MRI HEAD WITHOUT AND WITH CONTRAST TECHNIQUE: Multiplanar, multiecho pulse sequences of the brain and surrounding structures were obtained without and with intravenous contrast. CONTRAST:  75mL GADAVIST GADOBUTROL 1 MMOL/ML IV SOLN COMPARISON:  MRI head 03/30/2021 FINDINGS: Brain: Ventricle size and cerebral volume normal for age. Mild to moderate white matter changes in both cerebral hemispheres, similar to the prior study. Negative for  acute infarct, hemorrhage, mass. Normal enhancement following contrast administration. Vascular: Normal arterial flow voids Skull and upper cervical spine: No focal skeletal lesion. Sinuses/Orbits: Mucosal edema left maxillary sinus. Mild mucosal edema elsewhere. Negative orbit Other: 5 mm cyst in the subcutaneous tissues posterior to the right ear. 5 mm lesion in the right parietal scalp on axial image 11 which is  hypointense on T2. Benign appearance. IMPRESSION: No acute intracranial abnormality Mild to moderate chronic microvascular ischemic change in the white matter Electronically Signed: By: Franchot Gallo M.D. On: 05/18/2021 16:23    Microbiology: Results for orders placed or performed during the hospital encounter of 05/30/21  Resp Panel by RT-PCR (Flu A&B, Covid) Nasopharyngeal Swab     Status: None   Collection Time: 05/30/21  6:47 PM   Specimen: Nasopharyngeal Swab; Nasopharyngeal(NP) swabs in vial transport medium  Result Value Ref Range Status   SARS Coronavirus 2 by RT PCR NEGATIVE NEGATIVE Final    Comment: (NOTE) SARS-CoV-2 target nucleic acids are NOT DETECTED.  The SARS-CoV-2 RNA is generally detectable in upper respiratory specimens during the acute phase of infection. The lowest concentration of SARS-CoV-2 viral copies this assay can detect is 138 copies/mL. A negative result does not preclude SARS-Cov-2 infection and should not be used as the sole basis for treatment or other patient management decisions. A negative result may occur with  improper specimen collection/handling, submission of specimen other than nasopharyngeal swab, presence of viral mutation(s) within the areas targeted by this assay, and inadequate number of viral copies(<138 copies/mL). A negative result must be combined with clinical observations, patient history, and epidemiological information. The expected result is Negative.  Fact Sheet for Patients:  EntrepreneurPulse.com.au  Fact Sheet for Healthcare Providers:  IncredibleEmployment.be  This test is no t yet approved or cleared by the [place] FDA and  has been authorized for detection and/or diagnosis of SARS-CoV-2 by FDA under an Emergency Use Authorization (EUA). This EUA will remain  in effect (meaning this test can be used) for the duration of the COVID-19 declaration under Section 564(b)(1) of the Act,  21 U.S.C.section 360bbb-3(b)(1), unless the authorization is terminated  or revoked sooner.       Influenza A by PCR NEGATIVE NEGATIVE Final   Influenza B by PCR NEGATIVE NEGATIVE Final    Comment: (NOTE) The Xpert Xpress SARS-CoV-2/FLU/RSV plus assay is intended as an aid in the diagnosis of influenza from Nasopharyngeal swab specimens and should not be used as a sole basis for treatment. Nasal washings and aspirates are unacceptable for Xpert Xpress SARS-CoV-2/FLU/RSV testing.  Fact Sheet for Patients: EntrepreneurPulse.com.au  Fact Sheet for Healthcare Providers: IncredibleEmployment.be  This test is not yet approved or cleared by the [place] FDA and has been authorized for detection and/or diagnosis of SARS-CoV-2 by FDA under an Emergency Use Authorization (EUA). This EUA will remain in effect (meaning this test can be used) for the duration of the COVID-19 declaration under Section 564(b)(1) of the Act, 21 U.S.C. section 360bbb-3(b)(1), unless the authorization is terminated or revoked.  Performed at [facility], [address], [place], [place] [address]     Labs: CBC: Recent Labs  Lab 05/30/21 1838 05/31/21 0243 06/01/21 0125  WBC 4.7 4.1 5.1  NEUTROABS 2.1 1.6* 2.0  HGB 12.0 12.0 11.0*  HCT 37.7 36.9 33.8*  MCV 84.9 82.9 82.2  PLT 166 150 638   Basic Metabolic Panel: Recent Labs  Lab 05/30/21 1838 05/31/21 0243 06/01/21 0125  NA 144  --  143  K 3.6  --  3.4*  CL 108  --  108  CO2 26  --  23  GLUCOSE 89  --  92  BUN 14  --  12  CREATININE 1.31*  --  1.48*  CALCIUM 9.7  --  9.3  MG  --  1.9  --    Liver Function Tests: Recent Labs  Lab 05/30/21 1838  AST 28  ALT 32  ALKPHOS 55  BILITOT 0.7  PROT 6.4*  ALBUMIN 3.7   CBG: Recent Labs  Lab 05/30/21 1535  GLUCAP 89    Discharge time spent: greater than 30 minutes.  Signed: Hosie Poisson, MD [facility] 06/01/2021

## 2021-06-01 NOTE — TOC CAGE-AID Note (Signed)
Transition of Care [facility]) - CAGE-AID Screening   Patient Details  Name: Allison Watson MRN: [id number] Date of Birth: [birth year]  Transition of Care [facility]) CM/SW Contact:    Gaetano Hawthorne Tarpley-Carter, [place] Phone Number: 06/01/2021, 9:28 AM   Clinical Narrative: Pt participated in [place].  Pt stated she does not use substance or ETOH.  Pt was not offered resources, due to no usage of substance or ETOH.     Daveena Elmore Tarpley-Carter, MSW, LCSW-A Pronouns:  She/Her/Hers [place] Transitions of Care Clinical Social Worker Direct Number:  [phone] [email or web address]    CAGE-AID Screening:    Have You Ever Felt You Ought to Cut Down on Your Drinking or Drug Use?: No Have People Annoyed You By [employer] Your Drinking Or Drug Use?: No Have You Felt Bad Or Guilty About Your Drinking Or Drug Use?: No Have You Ever Had a Drink or Used Drugs First Thing In The Morning to Steady Your Nerves or to Get Rid of a Hangover?: No CAGE-AID Score: 0  Substance Abuse Education Offered: No

## 2021-06-09 ENCOUNTER — Emergency Department (HOSPITAL_COMMUNITY)
Admission: EM | Admit: 2021-06-09 | Discharge: 2021-06-10 | Disposition: A | Discharge status: Home / Self Care | Payer: Medicare HMO | Attending: Emergency Medicine | Admitting: Emergency Medicine

## 2021-06-09 ENCOUNTER — Emergency Department (HOSPITAL_COMMUNITY): Payer: Medicare HMO

## 2021-06-09 ENCOUNTER — Other Ambulatory Visit: Payer: Self-pay

## 2021-06-09 ENCOUNTER — Encounter (HOSPITAL_COMMUNITY): Payer: Self-pay | Admitting: Emergency Medicine

## 2021-06-09 DIAGNOSIS — Z79899 Other long term (current) drug therapy: Secondary | ICD-10-CM | POA: Diagnosis not present

## 2021-06-09 DIAGNOSIS — R944 Abnormal results of kidney function studies: Secondary | ICD-10-CM | POA: Insufficient documentation

## 2021-06-09 DIAGNOSIS — F0390 Unspecified dementia without behavioral disturbance: Secondary | ICD-10-CM | POA: Insufficient documentation

## 2021-06-09 DIAGNOSIS — S161XXA Strain of muscle, fascia and tendon at neck level, initial encounter: Secondary | ICD-10-CM | POA: Insufficient documentation

## 2021-06-09 DIAGNOSIS — W19XXXA Unspecified fall, initial encounter: Secondary | ICD-10-CM | POA: Insufficient documentation

## 2021-06-09 DIAGNOSIS — S0990XA Unspecified injury of head, initial encounter: Secondary | ICD-10-CM | POA: Diagnosis not present

## 2021-06-09 DIAGNOSIS — U071 COVID-19: Secondary | ICD-10-CM | POA: Diagnosis not present

## 2021-06-09 DIAGNOSIS — Y92009 Unspecified place in unspecified non-institutional (private) residence as the place of occurrence of the external cause: Secondary | ICD-10-CM | POA: Insufficient documentation

## 2021-06-09 DIAGNOSIS — E876 Hypokalemia: Secondary | ICD-10-CM | POA: Insufficient documentation

## 2021-06-09 LAB — BASIC METABOLIC PANEL
Anion gap: 10 (ref 5–15)
BUN: 16 mg/dL (ref 8–23)
CO2: 26 mmol/L (ref 22–32)
Calcium: 9.2 mg/dL (ref 8.9–10.3)
Chloride: 107 mmol/L (ref 98–111)
Creatinine, Ser: 1.43 mg/dL — ABNORMAL HIGH (ref 0.44–1.00)
GFR, Estimated: 38 mL/min — ABNORMAL LOW (ref >60)
Glucose, Bld: 86 mg/dL (ref 70–99)
Potassium: 3.4 mmol/L — ABNORMAL LOW (ref 3.5–5.1)
Sodium: 143 mmol/L (ref 135–145)

## 2021-06-09 LAB — CBC WITH DIFFERENTIAL/PLATELET
Abs Immature Granulocytes: 0 10*3/uL (ref 0.00–0.07)
Basophils Absolute: 0 10*3/uL (ref 0.0–0.1)
Basophils Relative: 1 %
Eosinophils Absolute: 0.2 10*3/uL (ref 0.0–0.5)
Eosinophils Relative: 5 %
HCT: 38.7 % (ref 36.0–46.0)
Hemoglobin: 12 g/dL (ref 12.0–15.0)
Immature Granulocytes: 0 %
Lymphocytes Relative: 47 %
Lymphs Abs: 2.1 10*3/uL (ref 0.7–4.0)
MCH: 26.8 pg (ref 26.0–34.0)
MCHC: 31 g/dL (ref 30.0–36.0)
MCV: 86.6 fL (ref 80.0–100.0)
Monocytes Absolute: 0.3 10*3/uL (ref 0.1–1.0)
Monocytes Relative: 8 %
Neutro Abs: 1.8 10*3/uL (ref 1.7–7.7)
Neutrophils Relative %: 39 %
Platelets: 130 10*3/uL — ABNORMAL LOW (ref 150–400)
RBC: 4.47 MIL/uL (ref 3.87–5.11)
RDW: 17 % — ABNORMAL HIGH (ref 11.5–15.5)
WBC: 4.5 10*3/uL (ref 4.0–10.5)
nRBC: 0 % (ref 0.0–0.2)

## 2021-06-09 IMAGING — CT CT HEAD W/O CM
4 series · 15 of 47 positions shown, 17 images · non-contrast
Comparison: CT head dated [DATE].

CLINICAL DATA: Fall



[Series 2: head w o · axial · 0.45mm/px · z∈[+49,+159]mm · 7 of 30 slices shown, 9 images]
[im 4/30  brain]
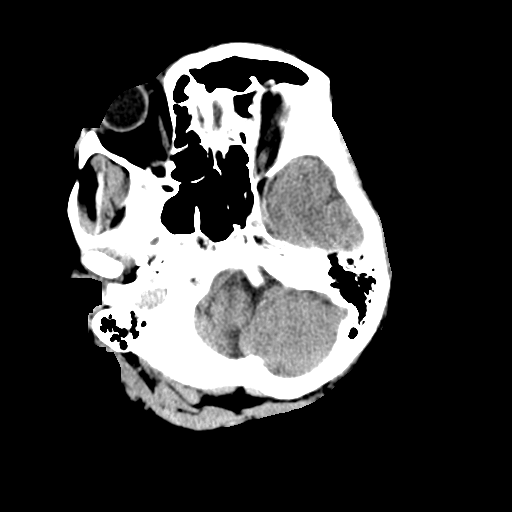
[im 4/30  bone]
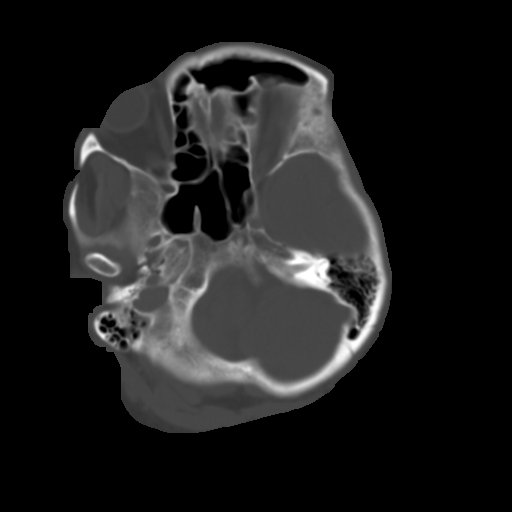
[im 8/30  brain]
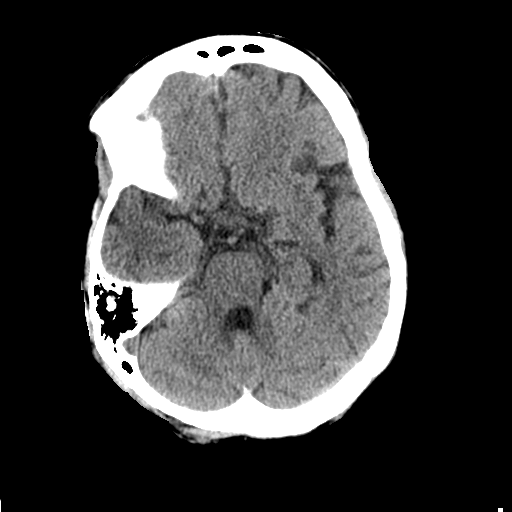
[im 11/30  brain]
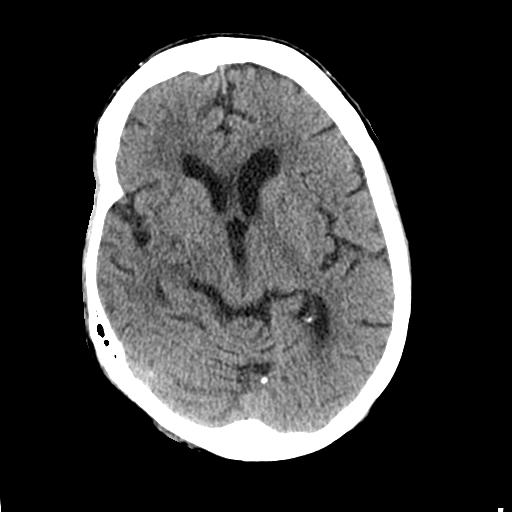
[im 15/30  brain]
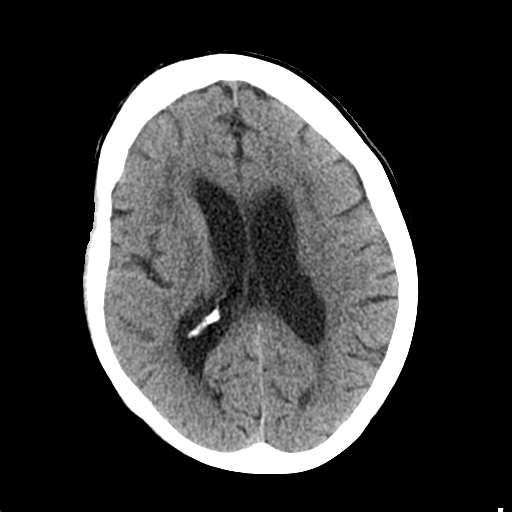
[im 19/30  brain]
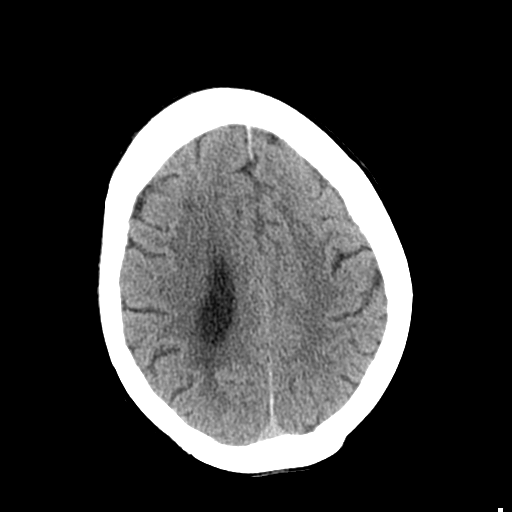
[im 19/30  bone]
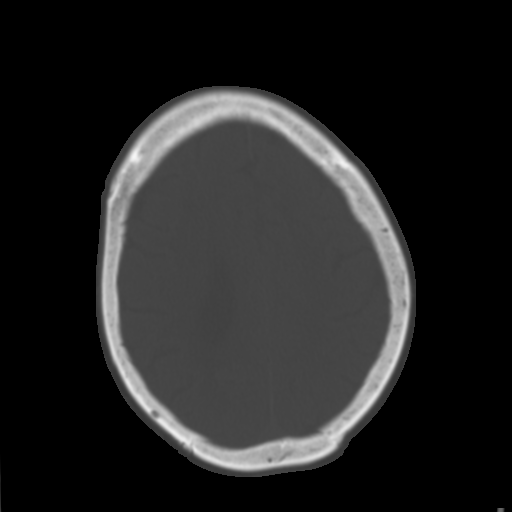
[im 22/30  brain]
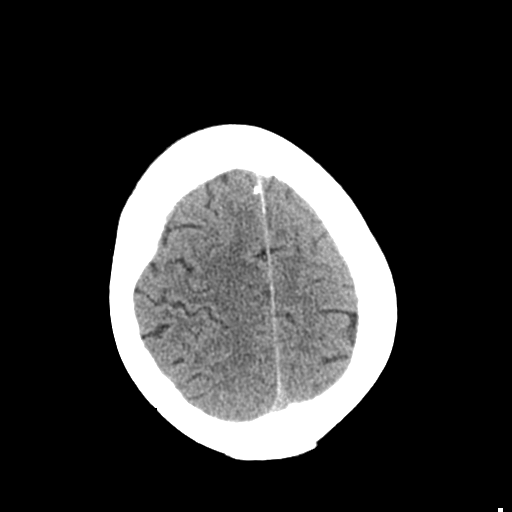
[im 26/30  brain]
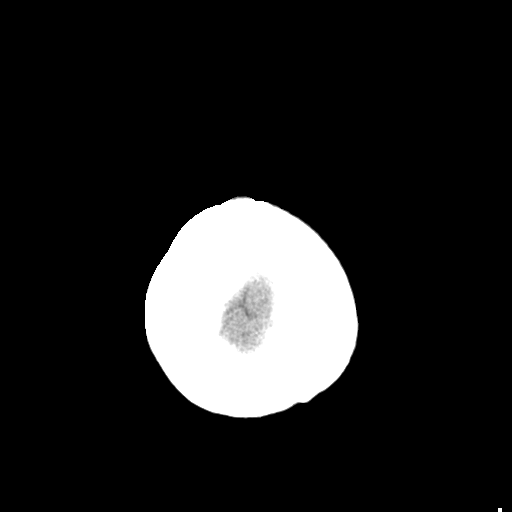

[Series 3: head bone · axial · 0.45mm/px · z∈[+48,+62]mm · 2 of 74 slices shown]
[im 8/74  bone]
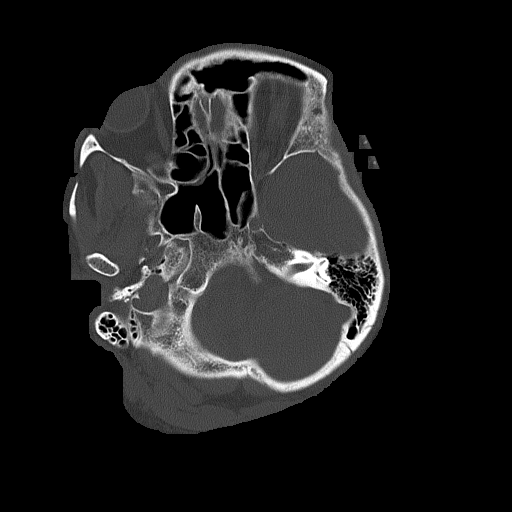
[im 15/74  bone]
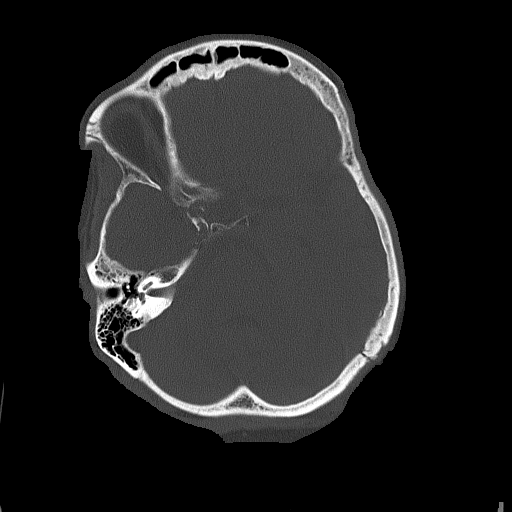

[Series 4: coronal soft · coronal · 0.29mm/px · 3 of 74 slices shown]
[im 25/74  brain]
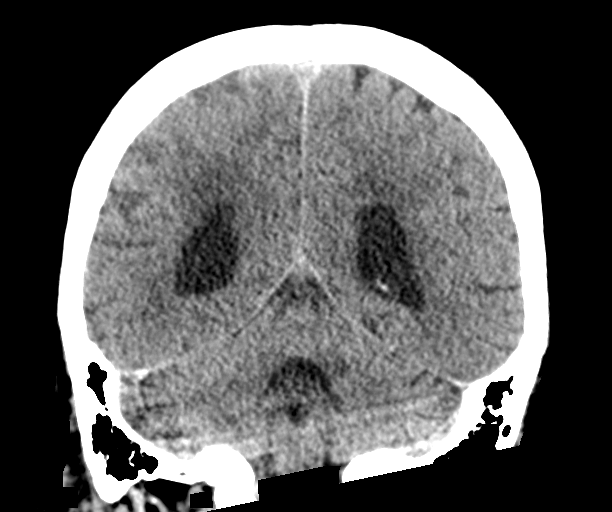
[im 33/74  brain]
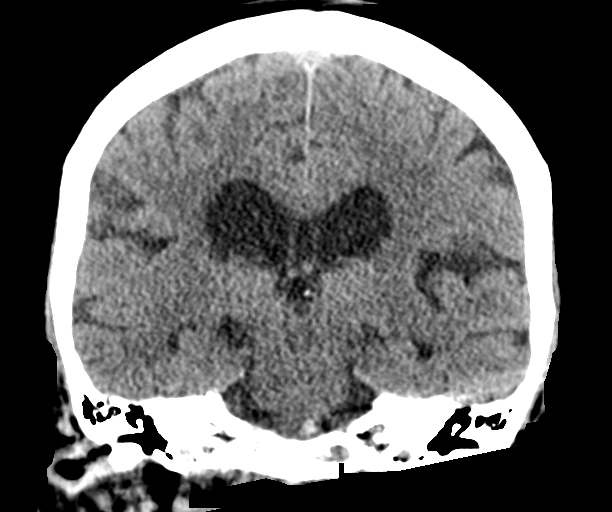
[im 41/74  brain]
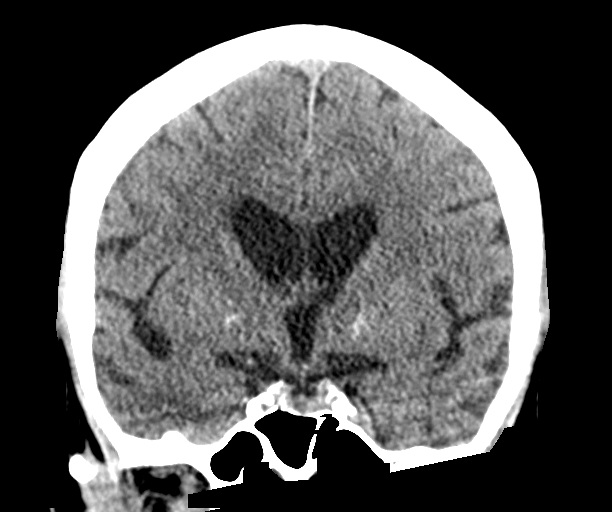

[Series 5: sagittal soft · sagittal · 0.29mm/px · 3 of 56 slices shown]
[im 22/56  brain]
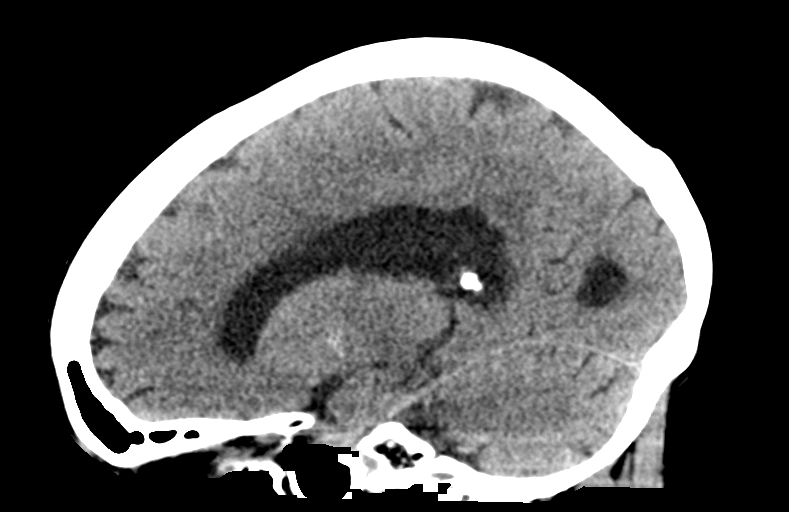
[im 28/56  brain]
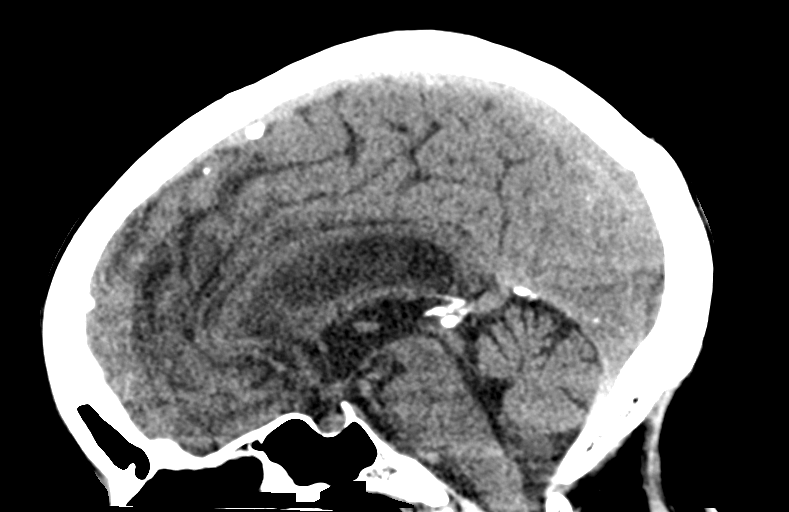
[im 34/56  brain]
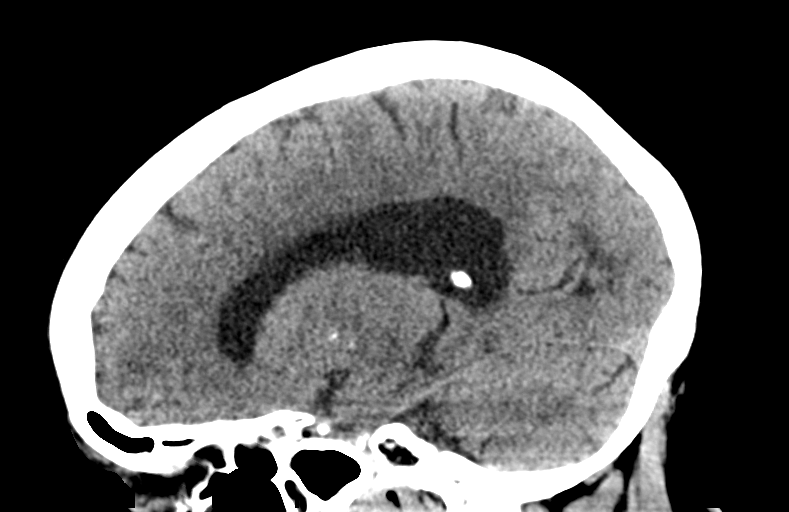

[15 of 47 positions shown; findings below may reference images not displayed]

FINDINGS: CT HEAD FINDINGS

Brain: No evidence of acute infarction, hemorrhage, hydrocephalus,
extra-axial collection or mass lesion/mass effect. Subcortical white
matter and periventricular small vessel ischemic changes.

Vascular: Intracranial atherosclerosis.

Skull: Normal. Negative for fracture or focal lesion.

Sinuses/Orbits: Minimal layering fluid in the right maxillary sinus.
Visualized paranasal sinuses and mastoid air cells are otherwise
clear.

Other: None.

CT CERVICAL SPINE FINDINGS

Alignment: Straightening of the cervical spine, likely positional.

Skull base and vertebrae: No acute fracture. No primary bone lesion
or focal pathologic process.

Soft tissues and spinal canal: No prevertebral fluid or swelling. No
visible canal hematoma.

Disc levels: Mild degenerative changes of the mid/lower cervical
spine. Spinal canal is patent.

Upper chest: Visualized lung apices are clear.

Other: Visualized thyroid is unremarkable.
IMPRESSION: No evidence of acute intracranial abnormality. Small vessel ischemic
changes.

No evidence of traumatic injury to the cervical spine. Mild
degenerative changes.

## 2021-06-09 IMAGING — DX DG CHEST 1V PORT
1 series · 1 of 1 positions shown · non-contrast
Comparison: [DATE]

CLINICAL DATA: Weakness, fell

EXAM:
PORTABLE CHEST 1 VIEW

[chest ap]
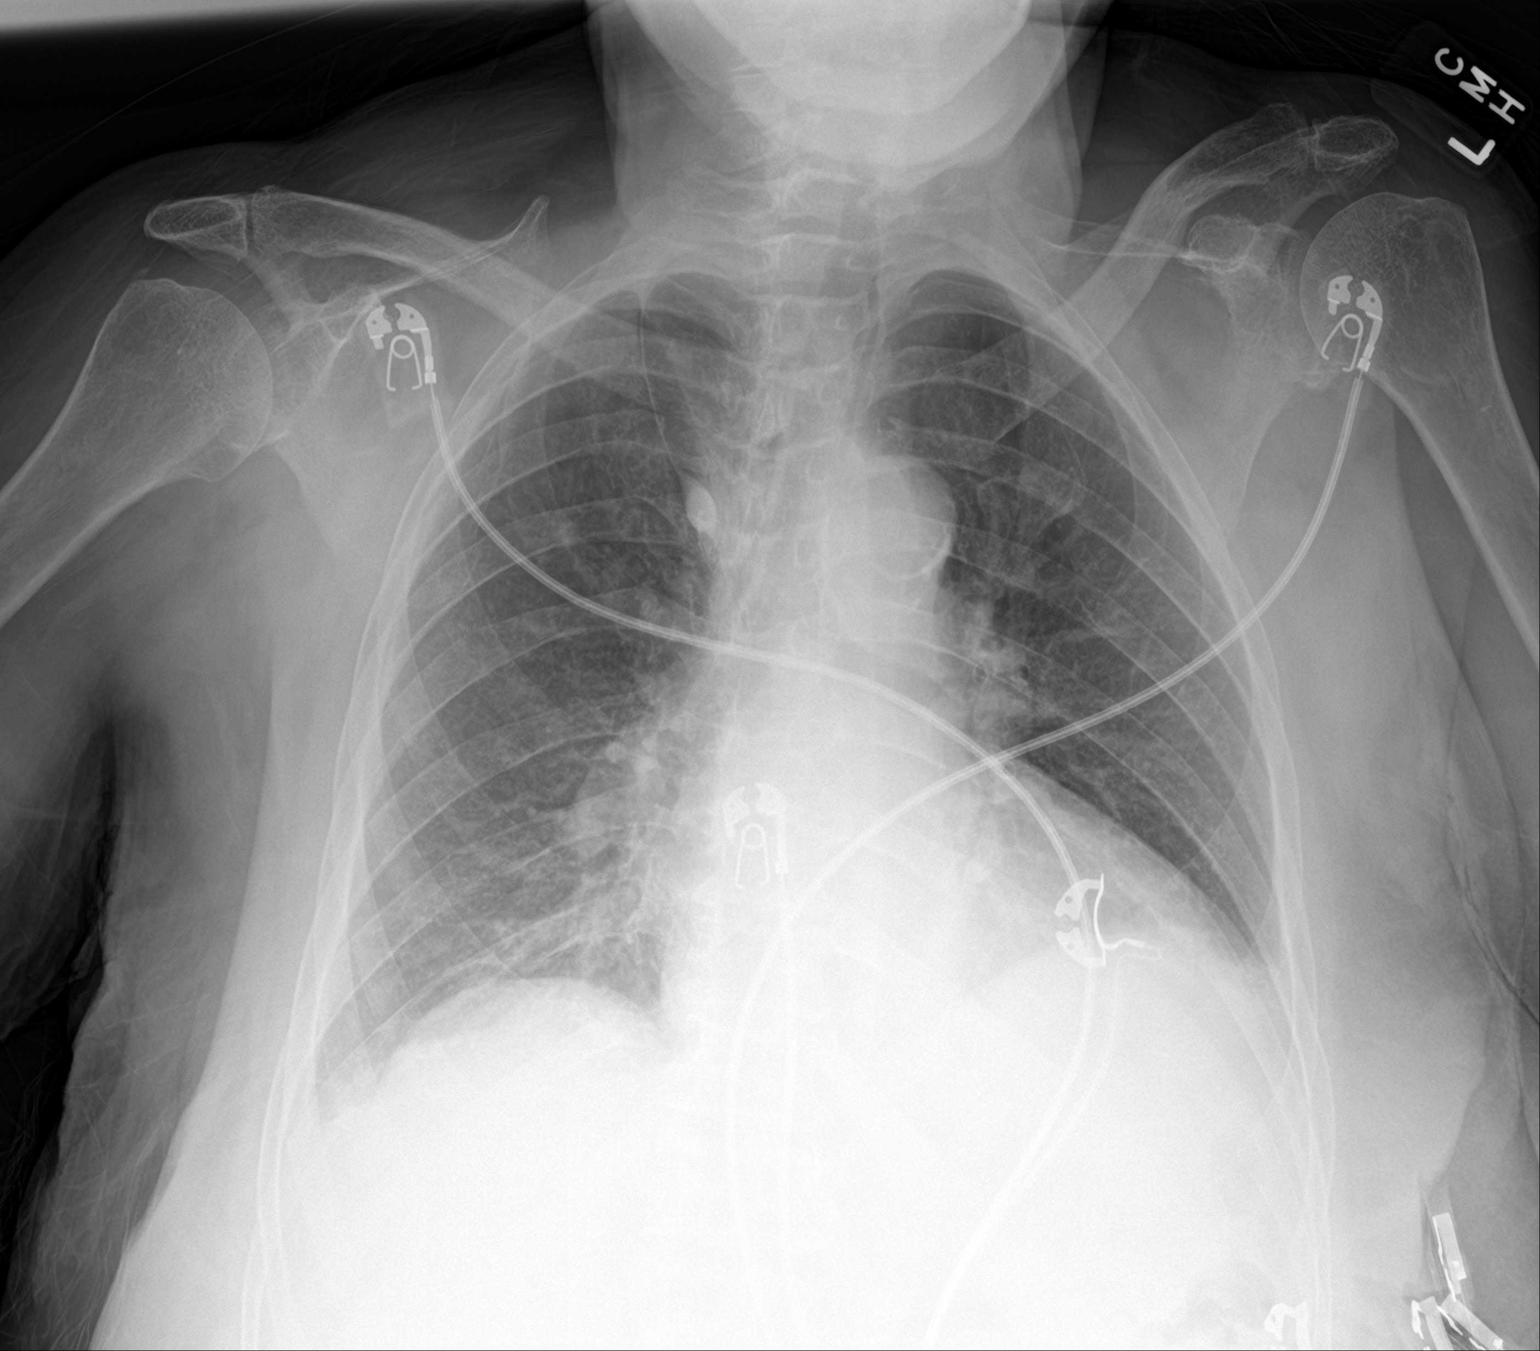

[1 of 1 positions shown; findings below may reference images not displayed]

FINDINGS: Single frontal view of the chest demonstrates stable enlargement the
cardiac silhouette. Mild central vascular congestion without
airspace disease, effusion, or pneumothorax. Incidental azygous
fissure. No acute fractures.
IMPRESSION: 1. Mild central vascular congestion.  No acute airspace disease.

## 2021-06-09 IMAGING — CT CT CERVICAL SPINE W/O CM
3 of 4 series · 13 of 35 positions shown, 16 images · non-contrast
Comparison: CT head dated [DATE].

CLINICAL DATA: Fall



[Series 5: sagittal bone · sagittal · 0.23mm/px · 5 of 61 slices shown, 6 images]
[im 21/61  bone]
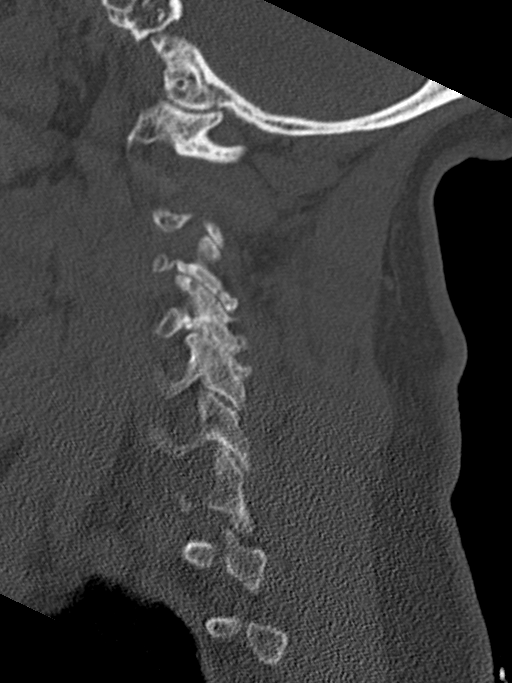
[im 26/61  bone]
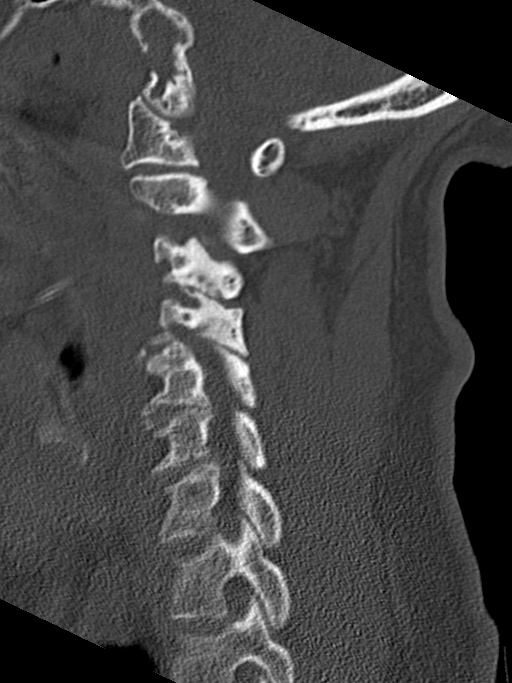
[im 31/61  soft-tissue]
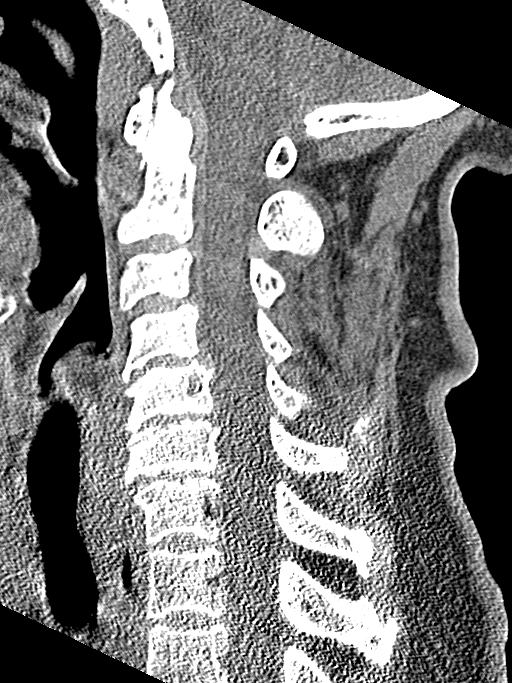
[im 31/61  bone]
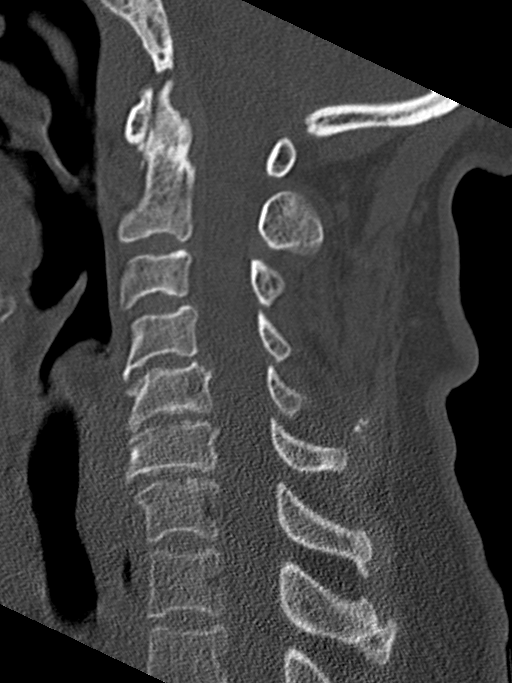
[im 36/61  bone]
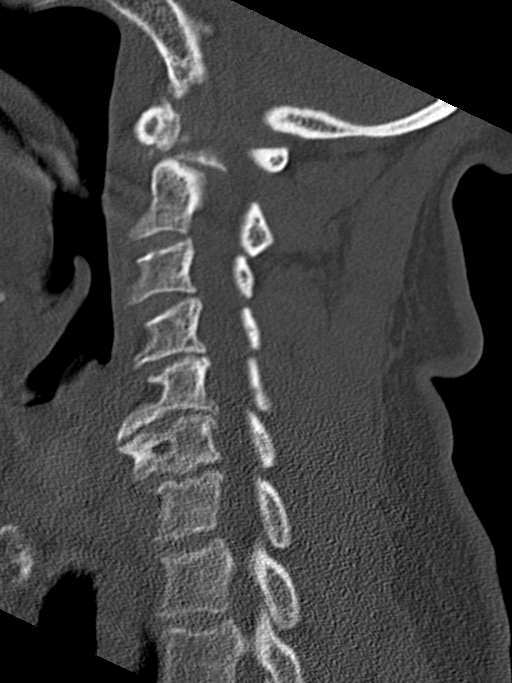
[im 41/61  bone]
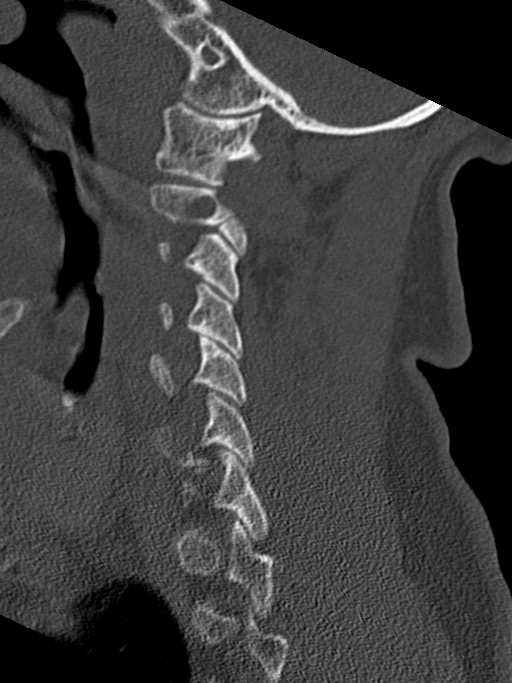

[Series 6: coronal bone · coronal · 0.23mm/px · 3 of 61 slices shown]
[im 14/61  bone]
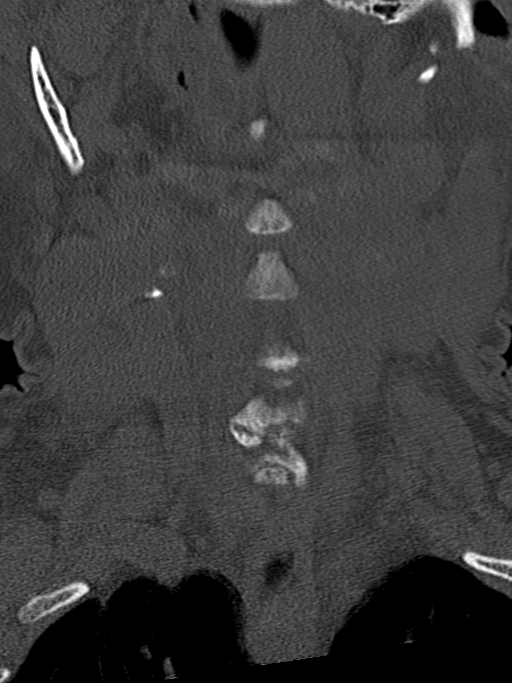
[im 25/61  bone]
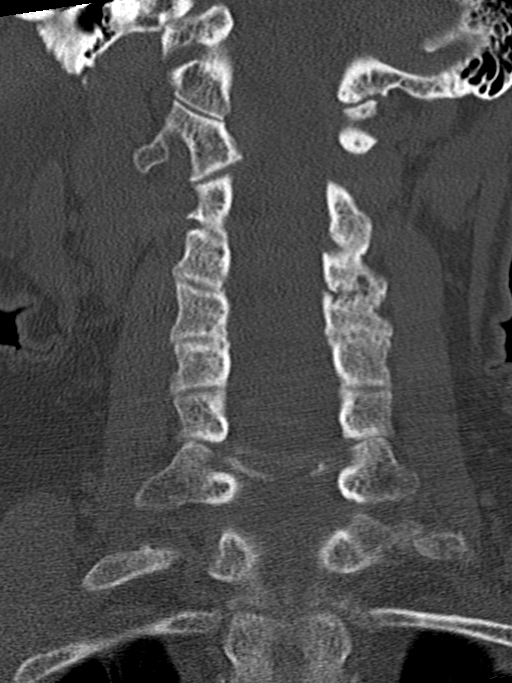
[im 36/61  bone]
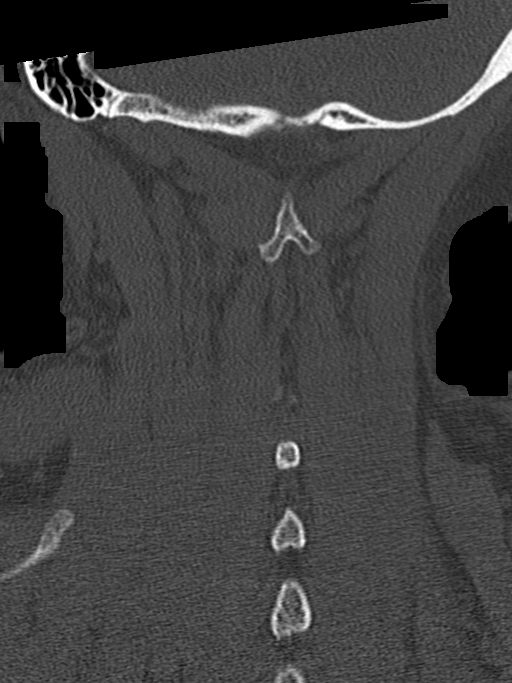

[Series 7: orthogonal axials · axial · 0.21mm/px · z∈[-103,-7]mm · 5 of 83 slices shown, 7 images]
[im 14/83  soft-tissue]
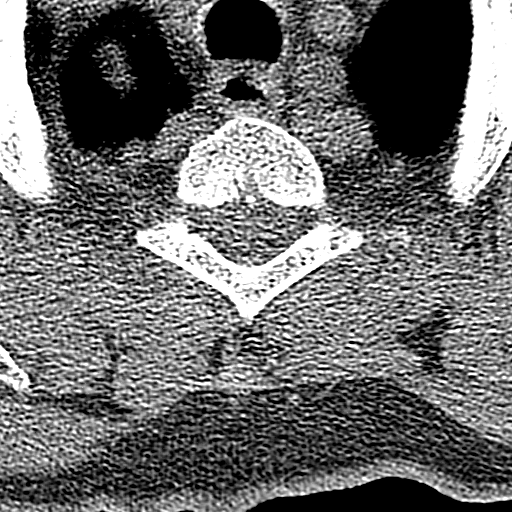
[im 14/83  bone]
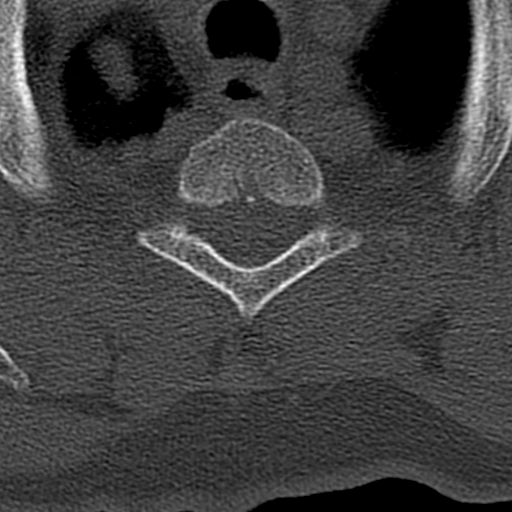
[im 28/83  bone]
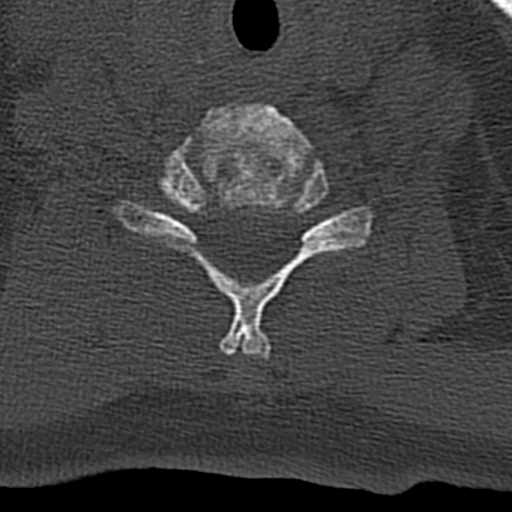
[im 42/83  bone]
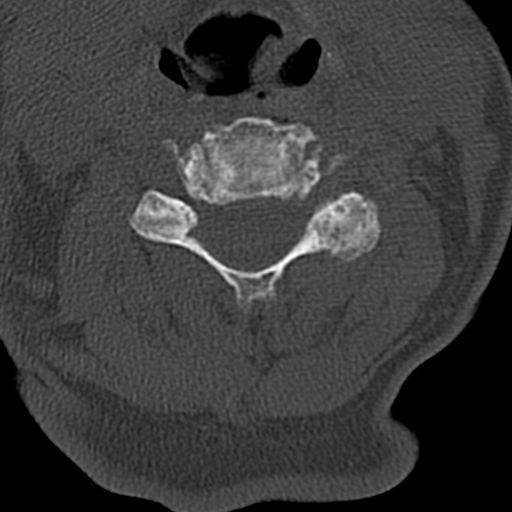
[im 55/83  bone]
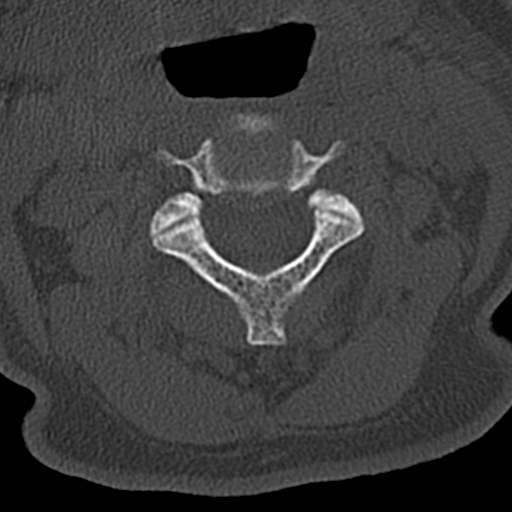
[im 69/83  soft-tissue]
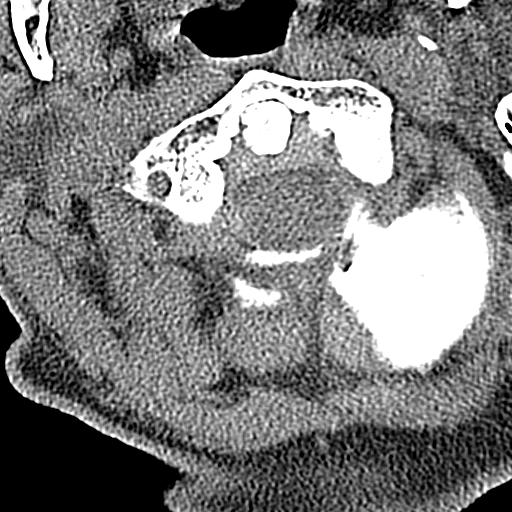
[im 69/83  bone]
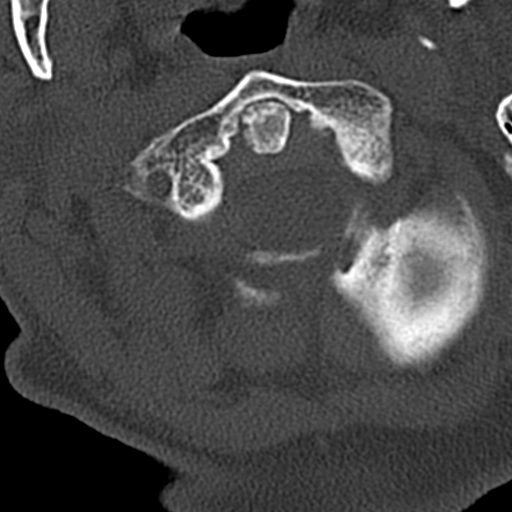

[13 of 35 positions shown; findings below may reference images not displayed]

FINDINGS: CT HEAD FINDINGS

Brain: No evidence of acute infarction, hemorrhage, hydrocephalus,
extra-axial collection or mass lesion/mass effect. Subcortical white
matter and periventricular small vessel ischemic changes.

Vascular: Intracranial atherosclerosis.

Skull: Normal. Negative for fracture or focal lesion.

Sinuses/Orbits: Minimal layering fluid in the right maxillary sinus.
Visualized paranasal sinuses and mastoid air cells are otherwise
clear.

Other: None.

CT CERVICAL SPINE FINDINGS

Alignment: Straightening of the cervical spine, likely positional.

Skull base and vertebrae: No acute fracture. No primary bone lesion
or focal pathologic process.

Soft tissues and spinal canal: No prevertebral fluid or swelling. No
visible canal hematoma.

Disc levels: Mild degenerative changes of the mid/lower cervical
spine. Spinal canal is patent.

Upper chest: Visualized lung apices are clear.

Other: Visualized thyroid is unremarkable.
IMPRESSION: No evidence of acute intracranial abnormality. Small vessel ischemic
changes.

No evidence of traumatic injury to the cervical spine. Mild
degenerative changes.

## 2021-06-09 MED ORDER — SODIUM CHLORIDE 0.9 % IV BOLUS: 500.0000 mL | Freq: Once | INTRAVENOUS | Status: DC

## 2021-06-09 NOTE — ED Notes (Signed)
IV access attempted x 2- unsuccessful- per Dr Melina Copa, obtain blood- no need for IV fluids at this time.

## 2021-06-09 NOTE — ED Triage Notes (Signed)
Pt brought in by granddaughter after falling tonight. Granddaughter states they were napping when she woke up to a thud and found the pt on the ground. Unknown if she LOC or if she hit her head. Family denies blood thinners. Pt has hx of dementia

## 2021-06-09 NOTE — ED Provider Notes (Signed)
[facility] EMERGENCY DEPARTMENT Provider Note   CSN: [id number] Arrival date & time: 06/09/21  2125     History  Chief Complaint  Patient presents with   Lytle Michaels    Allison Watson is a 81 y.o. female.  Level 5 caveat secondary to dementia. she is brought in from home for evaluation after 2 falls today at home.  Granddaughter is providing most of the history.  She had an unwitnessed fall tonight.  She denied being heard.  She was able to get up afterwards with help.  Granddaughter felt like she was swaying a little more when she was standing.  Patient denies any complaints and does recall falling.  She was admitted about 12 days ago for a fall in which they found a small intraparenchymal hemorrhage.  No interventions were needed.  No nausea or vomiting no abdominal pain no chest pain no shortness of breath.  The history is provided by the patient and a relative.  Fall This is a recurrent problem. The current episode started 3 to 5 hours ago. The problem has been resolved. Pertinent negatives include no chest pain, no abdominal pain, no headaches and no shortness of breath. Nothing aggravates the symptoms. Nothing relieves the symptoms. She has tried nothing for the symptoms. The treatment provided no relief.      Home Medications Prior to Admission medications   Medication Sig Start Date End Date Taking? Authorizing Provider  albuterol (VENTOLIN HFA) 108 (90 Base) MCG/ACT inhaler Inhale 1-2 puffs into the lungs every 6 (six) hours as needed for wheezing or shortness of breath. Will use when too hot/ too cold outside    [provider]  cholecalciferol (VITAMIN D3) 25 MCG (1000 UNIT) tablet Take 1,000 Units by mouth daily.    [provider]  clonazePAM (KLONOPIN) 1 MG tablet Take 1 mg by mouth 2 (two) times daily as needed. 05/20/21   [provider]  cyanocobalamin (,VITAMIN B-12,) 1000 MCG/ML injection Inject 1,000 mcg into the skin every 30 (thirty) days.  12/09/20   [provider]  divalproex (DEPAKOTE) 250 MG DR tablet Take 1 tablet (250 mg total) by mouth every 12 (twelve) hours. 03/31/21 05/30/21  Deatra James, MD  hydrALAZINE (APRESOLINE) 50 MG tablet Take 1 tablet (50 mg total) by mouth 3 (three) times daily. 06/01/21   Hosie Poisson, MD  labetalol (NORMODYNE) 100 MG tablet Take 1 tablet (100 mg total) by mouth 2 (two) times daily. 10/09/20   Roxan Hockey, MD  Multiple Vitamin (MULTIVITAMIN) tablet Take 1 tablet by mouth daily.    [provider]  QUEtiapine (SEROQUEL) 25 MG tablet Take 0.5 tablets (12.5 mg total) by mouth at bedtime. 06/01/21   Hosie Poisson, MD      Allergies    Codeine, Lorazepam, and Penicillins    Review of Systems   Review of Systems  Constitutional:  Negative for fever.  HENT:  Negative for sore throat.   Eyes:  Negative for visual disturbance.  Respiratory:  Negative for shortness of breath.   Cardiovascular:  Negative for chest pain.  Gastrointestinal:  Negative for abdominal pain.  Genitourinary:  Negative for dysuria.  Musculoskeletal:  Negative for neck pain.  Skin:  Negative for rash.  Neurological:  Negative for headaches.   Physical Exam Updated Vital Signs BP (!) 118/104    Pulse 76    Temp 98.4 F (36.9 C)    Resp 18    Ht 5' (1.524 m)    Wt  72.6 kg    SpO2 99%    BMI 31.26 kg/m  Physical Exam Vitals and nursing note reviewed.  Constitutional:      General: She is not in acute distress.    Appearance: Normal appearance. She is well-developed.  HENT:     Head: Normocephalic and atraumatic.  Eyes:     Conjunctiva/sclera: Conjunctivae normal.  Cardiovascular:     Rate and Rhythm: Normal rate and regular rhythm.     Pulses: Normal pulses.     Heart sounds: No murmur heard. Pulmonary:     Effort: Pulmonary effort is normal. No respiratory distress.     Breath sounds: Normal breath sounds.  Abdominal:     Palpations: Abdomen is soft.     Tenderness: There is no  abdominal tenderness. There is no guarding or rebound.  Musculoskeletal:        General: No swelling or tenderness. Normal range of motion.     Cervical back: Neck supple.  Skin:    General: Skin is warm and dry.     Capillary Refill: Capillary refill takes less than 2 seconds.  Neurological:     General: No focal deficit present.     Mental Status: She is alert.     Comments: She has no obvious facial asymmetry or dysarthria.  Moving all extremities without any pain or limitations.  No weakness.    ED Results / Procedures / Treatments   Labs (all labs ordered are listed, but only abnormal results are displayed) Labs Reviewed  RESP PANEL BY RT-PCR (FLU A&B, COVID) ARPGX2 - Abnormal; Notable for the following components:      Result Value   SARS Coronavirus 2 by RT PCR POSITIVE (*)    All other components within normal limits  BASIC METABOLIC PANEL - Abnormal; Notable for the following components:   Potassium 3.4 (*)    Creatinine, Ser 1.43 (*)    GFR, Estimated 38 (*)    All other components within normal limits  CBC WITH DIFFERENTIAL/PLATELET - Abnormal; Notable for the following components:   RDW 17.0 (*)    Platelets 130 (*)    All other components within normal limits  VALPROIC ACID LEVEL    EKG None  Radiology CT Head Wo Contrast  Result Date: 06/09/2021 CLINICAL DATA:  Fall EXAM: CT HEAD WITHOUT CONTRAST CT CERVICAL SPINE WITHOUT CONTRAST TECHNIQUE: Multidetector CT imaging of the head and cervical spine was performed following the standard protocol without intravenous contrast. Multiplanar CT image reconstructions of the cervical spine were also generated. RADIATION DOSE REDUCTION: This exam was performed according to the departmental dose-optimization program which includes automated exposure control, adjustment of the mA and/or kV according to patient size and/or use of iterative reconstruction technique. COMPARISON:  CT head dated 05/30/2021. FINDINGS: CT HEAD  FINDINGS Brain: No evidence of acute infarction, hemorrhage, hydrocephalus, extra-axial collection or mass lesion/mass effect. Subcortical white matter and periventricular small vessel ischemic changes. Vascular: Intracranial atherosclerosis. Skull: Normal. Negative for fracture or focal lesion. Sinuses/Orbits: Minimal layering fluid in the right maxillary sinus. Visualized paranasal sinuses and mastoid air cells are otherwise clear. Other: None. CT CERVICAL SPINE FINDINGS Alignment: Straightening of the cervical spine, likely positional. Skull base and vertebrae: No acute fracture. No primary bone lesion or focal pathologic process. Soft tissues and spinal canal: No prevertebral fluid or swelling. No visible canal hematoma. Disc levels: Mild degenerative changes of the mid/lower cervical spine. Spinal canal is patent. Upper chest: Visualized lung apices are clear. Other:  Visualized thyroid is unremarkable. IMPRESSION: No evidence of acute intracranial abnormality. Small vessel ischemic changes. No evidence of traumatic injury to the cervical spine. Mild degenerative changes. Electronically Signed   By: Julian Hy M.D.   On: 06/09/2021 23:52   CT Cervical Spine Wo Contrast  Result Date: 06/09/2021 CLINICAL DATA:  Fall EXAM: CT HEAD WITHOUT CONTRAST CT CERVICAL SPINE WITHOUT CONTRAST TECHNIQUE: Multidetector CT imaging of the head and cervical spine was performed following the standard protocol without intravenous contrast. Multiplanar CT image reconstructions of the cervical spine were also generated. RADIATION DOSE REDUCTION: This exam was performed according to the departmental dose-optimization program which includes automated exposure control, adjustment of the mA and/or kV according to patient size and/or use of iterative reconstruction technique. COMPARISON:  CT head dated 05/30/2021. FINDINGS: CT HEAD FINDINGS Brain: No evidence of acute infarction, hemorrhage, hydrocephalus, extra-axial collection  or mass lesion/mass effect. Subcortical white matter and periventricular small vessel ischemic changes. Vascular: Intracranial atherosclerosis. Skull: Normal. Negative for fracture or focal lesion. Sinuses/Orbits: Minimal layering fluid in the right maxillary sinus. Visualized paranasal sinuses and mastoid air cells are otherwise clear. Other: None. CT CERVICAL SPINE FINDINGS Alignment: Straightening of the cervical spine, likely positional. Skull base and vertebrae: No acute fracture. No primary bone lesion or focal pathologic process. Soft tissues and spinal canal: No prevertebral fluid or swelling. No visible canal hematoma. Disc levels: Mild degenerative changes of the mid/lower cervical spine. Spinal canal is patent. Upper chest: Visualized lung apices are clear. Other: Visualized thyroid is unremarkable. IMPRESSION: No evidence of acute intracranial abnormality. Small vessel ischemic changes. No evidence of traumatic injury to the cervical spine. Mild degenerative changes. Electronically Signed   By: Julian Hy M.D.   On: 06/09/2021 23:52   DG Chest Port 1 View  Result Date: 06/09/2021 CLINICAL DATA:  Weakness, fell EXAM: PORTABLE CHEST 1 VIEW COMPARISON:  05/30/2021 FINDINGS: Single frontal view of the chest demonstrates stable enlargement the cardiac silhouette. Mild central vascular congestion without airspace disease, effusion, or pneumothorax. Incidental azygous fissure. No acute fractures. IMPRESSION: 1. Mild central vascular congestion.  No acute airspace disease. Electronically Signed   By: Randa Ngo M.D.   On: 06/09/2021 23:55    Procedures Procedures    Medications Ordered in ED Medications  sodium chloride 0.9 % bolus 500 mL (has no administration in time range)    ED Course/ Medical Decision Making/ A&P                           Medical Decision Making Amount and/or Complexity of Data Reviewed Labs: ordered. Radiology: ordered.  This patient complains of fall;  this involves an extensive number of treatment Options and is a complaint that carries with it a high risk of complications and morbidity. The differential includes intracranial injury, fracture, metabolic derangement, dehydration, infection, arrhythmia  I ordered, reviewed and interpreted labs, which included CBC with normal white count normal hemoglobin, platelets slightly low, chemistries with mildly low potassium elevated creatinine stable from priors.  I ordered imaging studies which included CT head and cervical spine and chest x-ray and these are pending at time of signout  Additional history obtained from patient's granddaughter Previous records obtained and reviewed in epic including recent admission for intracranial bleed after fall  Cardiac monitoring reviewed, normal sinus rhythm Social determinants considered, patient has dementia Critical Interventions: None  After the interventions stated above, I reevaluated the patient and found patient to be fairly  asymptomatic and with stable vital signs.  Her care is signed out to oncoming provider to follow-up on results of lab work and imaging.  If no acute findings patient can likely be discharged and outpatient follow-up with her PCP           Final Clinical Impression(s) / ED Diagnoses Final diagnoses:  Fall in home, initial encounter  Closed head injury, initial encounter  Strain of neck muscle, initial encounter  Lab test positive for detection of COVID-19 virus    Rx / DC Orders ED Discharge Orders     None         Hayden Rasmussen, MD 06/10/21 1008

## 2021-06-09 NOTE — ED Notes (Signed)
Patient transported to CT 

## 2021-06-09 NOTE — ED Notes (Signed)
Lab at bedside to draw blood.

## 2021-06-10 LAB — RESP PANEL BY RT-PCR (FLU A&B, COVID) ARPGX2
Influenza A by PCR: NEGATIVE
Influenza B by PCR: NEGATIVE
SARS Coronavirus 2 by RT PCR: POSITIVE — AB

## 2021-06-10 LAB — VALPROIC ACID LEVEL: Valproic Acid Lvl: 84 ug/mL (ref 50.0–100.0)

## 2021-06-10 NOTE — Discharge Instructions (Addendum)
Follow-up with your neurologist in the next week.  Continue medications as previously prescribed.

## 2021-06-10 NOTE — ED Provider Notes (Signed)
°  Physical Exam  BP (!) 155/95    Pulse 63    Temp 98.4 F (36.9 C)    Resp 17    Ht 5' (1.524 m)    Wt 72.6 kg    SpO2 94%    BMI 31.26 kg/m   Physical Exam Vitals and nursing note reviewed.  Constitutional:      Appearance: Normal appearance.  HENT:     Head: Normocephalic and atraumatic.  Pulmonary:     Effort: Pulmonary effort is normal.  Skin:    General: Skin is warm and dry.  Neurological:     General: No focal deficit present.     Mental Status: She is alert.     Comments: Patient awake and alert.  She seems somewhat tremulous, disoriented, but according to daughter at her baseline prior to the fall.    Procedures  Procedures  ED Course / MDM   Care assumed from Dr. Melina Copa at shift change.  Patient awaiting results of imaging studies and laboratory studies.  She apparently fell at home this evening.  Imaging studies are unremarkable and she seems back at her baseline.  Nothing tonight seems emergent and I feel as though patient can safely be discharged.  Daughter is present at bedside and has concerns about her progressive decline.  She has apparently been experiencing increased confusion, discoordination, and behavioral changes over the past 8 months.  She was recently admitted for intraparenchymal hemorrhage related to fall.  I have encouraged the daughter to follow-up with a neurologist to present him with these questions.  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  Thus far it sounds as though she has just been told it is dementia, but would like a more specific diagnosis.        Veryl Speak, MD 06/10/21 0100

## 2021-06-20 ENCOUNTER — Inpatient Hospital Stay (HOSPITAL_COMMUNITY)
Admission: EM | Admit: 2021-06-20 | Discharge: 2021-06-23 | DRG: 641 | Disposition: A | Discharge status: Skilled Nursing Facility | Payer: Medicare HMO | Attending: Family Medicine | Admitting: Family Medicine

## 2021-06-20 ENCOUNTER — Encounter (HOSPITAL_COMMUNITY): Payer: Self-pay

## 2021-06-20 ENCOUNTER — Emergency Department (HOSPITAL_COMMUNITY): Payer: Medicare HMO

## 2021-06-20 ENCOUNTER — Other Ambulatory Visit: Payer: Self-pay

## 2021-06-20 DIAGNOSIS — N184 Chronic kidney disease, stage 4 (severe): Secondary | ICD-10-CM | POA: Diagnosis present

## 2021-06-20 DIAGNOSIS — Z87891 Personal history of nicotine dependence: Secondary | ICD-10-CM | POA: Diagnosis not present

## 2021-06-20 DIAGNOSIS — S0990XA Unspecified injury of head, initial encounter: Secondary | ICD-10-CM

## 2021-06-20 DIAGNOSIS — Z515 Encounter for palliative care: Secondary | ICD-10-CM

## 2021-06-20 DIAGNOSIS — M109 Gout, unspecified: Secondary | ICD-10-CM | POA: Diagnosis present

## 2021-06-20 DIAGNOSIS — Z880 Allergy status to penicillin: Secondary | ICD-10-CM | POA: Diagnosis not present

## 2021-06-20 DIAGNOSIS — Z885 Allergy status to narcotic agent status: Secondary | ICD-10-CM | POA: Diagnosis not present

## 2021-06-20 DIAGNOSIS — Z8616 Personal history of COVID-19: Secondary | ICD-10-CM

## 2021-06-20 DIAGNOSIS — R627 Adult failure to thrive: Principal | ICD-10-CM | POA: Diagnosis present

## 2021-06-20 DIAGNOSIS — E860 Dehydration: Secondary | ICD-10-CM | POA: Diagnosis present

## 2021-06-20 DIAGNOSIS — E440 Moderate protein-calorie malnutrition: Secondary | ICD-10-CM | POA: Diagnosis present

## 2021-06-20 DIAGNOSIS — R1310 Dysphagia, unspecified: Secondary | ICD-10-CM

## 2021-06-20 DIAGNOSIS — I10 Essential (primary) hypertension: Secondary | ICD-10-CM | POA: Diagnosis present

## 2021-06-20 DIAGNOSIS — G9341 Metabolic encephalopathy: Secondary | ICD-10-CM | POA: Diagnosis present

## 2021-06-20 DIAGNOSIS — Z6831 Body mass index (BMI) 31.0-31.9, adult: Secondary | ICD-10-CM | POA: Diagnosis not present

## 2021-06-20 DIAGNOSIS — R64 Cachexia: Secondary | ICD-10-CM | POA: Diagnosis present

## 2021-06-20 DIAGNOSIS — Z79899 Other long term (current) drug therapy: Secondary | ICD-10-CM | POA: Diagnosis not present

## 2021-06-20 DIAGNOSIS — R253 Fasciculation: Secondary | ICD-10-CM | POA: Diagnosis present

## 2021-06-20 DIAGNOSIS — Z888 Allergy status to other drugs, medicaments and biological substances status: Secondary | ICD-10-CM

## 2021-06-20 DIAGNOSIS — D696 Thrombocytopenia, unspecified: Secondary | ICD-10-CM | POA: Diagnosis present

## 2021-06-20 DIAGNOSIS — E669 Obesity, unspecified: Secondary | ICD-10-CM | POA: Diagnosis present

## 2021-06-20 DIAGNOSIS — R531 Weakness: Secondary | ICD-10-CM | POA: Diagnosis present

## 2021-06-20 DIAGNOSIS — I1310 Hypertensive heart and chronic kidney disease without heart failure, with stage 1 through stage 4 chronic kidney disease, or unspecified chronic kidney disease: Secondary | ICD-10-CM | POA: Diagnosis present

## 2021-06-20 DIAGNOSIS — F0393 Unspecified dementia, unspecified severity, with mood disturbance: Secondary | ICD-10-CM | POA: Diagnosis present

## 2021-06-20 DIAGNOSIS — U071 COVID-19: Secondary | ICD-10-CM | POA: Diagnosis present

## 2021-06-20 DIAGNOSIS — E8889 Other specified metabolic disorders: Secondary | ICD-10-CM | POA: Diagnosis present

## 2021-06-20 DIAGNOSIS — R824 Acetonuria: Secondary | ICD-10-CM | POA: Diagnosis present

## 2021-06-20 DIAGNOSIS — R296 Repeated falls: Secondary | ICD-10-CM | POA: Diagnosis present

## 2021-06-20 DIAGNOSIS — Z7189 Other specified counseling: Secondary | ICD-10-CM | POA: Diagnosis not present

## 2021-06-20 LAB — URINALYSIS, ROUTINE W REFLEX MICROSCOPIC
Bilirubin Urine: NEGATIVE
Glucose, UA: NEGATIVE mg/dL
Hgb urine dipstick: NEGATIVE
Ketones, ur: 80 mg/dL — AB
Leukocytes,Ua: NEGATIVE
Nitrite: NEGATIVE
Protein, ur: 30 mg/dL — AB
Specific Gravity, Urine: 1.027 (ref 1.005–1.030)
pH: 5 (ref 5.0–8.0)

## 2021-06-20 LAB — CBC WITH DIFFERENTIAL/PLATELET
Abs Immature Granulocytes: 0.02 10*3/uL (ref 0.00–0.07)
Basophils Absolute: 0 10*3/uL (ref 0.0–0.1)
Basophils Relative: 1 %
Eosinophils Absolute: 0.1 10*3/uL (ref 0.0–0.5)
Eosinophils Relative: 3 %
HCT: 42.3 % (ref 36.0–46.0)
Hemoglobin: 13 g/dL (ref 12.0–15.0)
Immature Granulocytes: 0 %
Lymphocytes Relative: 37 %
Lymphs Abs: 1.8 10*3/uL (ref 0.7–4.0)
MCH: 25.9 pg — ABNORMAL LOW (ref 26.0–34.0)
MCHC: 30.7 g/dL (ref 30.0–36.0)
MCV: 84.3 fL (ref 80.0–100.0)
Monocytes Absolute: 0.5 10*3/uL (ref 0.1–1.0)
Monocytes Relative: 10 %
Neutro Abs: 2.4 10*3/uL (ref 1.7–7.7)
Neutrophils Relative %: 49 %
Platelets: 100 10*3/uL — ABNORMAL LOW (ref 150–400)
RBC: 5.02 MIL/uL (ref 3.87–5.11)
RDW: 16.9 % — ABNORMAL HIGH (ref 11.5–15.5)
WBC: 4.8 10*3/uL (ref 4.0–10.5)
nRBC: 0 % (ref 0.0–0.2)

## 2021-06-20 LAB — COMPREHENSIVE METABOLIC PANEL
ALT: 19 U/L (ref 0–44)
AST: 24 U/L (ref 15–41)
Albumin: 3.8 g/dL (ref 3.5–5.0)
Alkaline Phosphatase: 40 U/L (ref 38–126)
Anion gap: 12 (ref 5–15)
BUN: 26 mg/dL — ABNORMAL HIGH (ref 8–23)
CO2: 24 mmol/L (ref 22–32)
Calcium: 10 mg/dL (ref 8.9–10.3)
Chloride: 112 mmol/L — ABNORMAL HIGH (ref 98–111)
Creatinine, Ser: 1.65 mg/dL — ABNORMAL HIGH (ref 0.44–1.00)
GFR, Estimated: 32 mL/min — ABNORMAL LOW (ref >60)
Glucose, Bld: 71 mg/dL (ref 70–99)
Potassium: 4.3 mmol/L (ref 3.5–5.1)
Sodium: 148 mmol/L — ABNORMAL HIGH (ref 135–145)
Total Bilirubin: 0.7 mg/dL (ref 0.3–1.2)
Total Protein: 6.9 g/dL (ref 6.5–8.1)

## 2021-06-20 LAB — RESP PANEL BY RT-PCR (FLU A&B, COVID) ARPGX2
Influenza A by PCR: NEGATIVE
Influenza B by PCR: NEGATIVE
SARS Coronavirus 2 by RT PCR: POSITIVE — AB

## 2021-06-20 LAB — MAGNESIUM: Magnesium: 2 mg/dL (ref 1.7–2.4)

## 2021-06-20 IMAGING — DX DG CHEST 1V PORT
1 series · 1 of 1 positions shown · non-contrast
Comparison: [DATE]

CLINICAL DATA: Possible aspiration. Sob. Failure to thrive

EXAM:
PORTABLE CHEST - 1 VIEW

[chest ap]
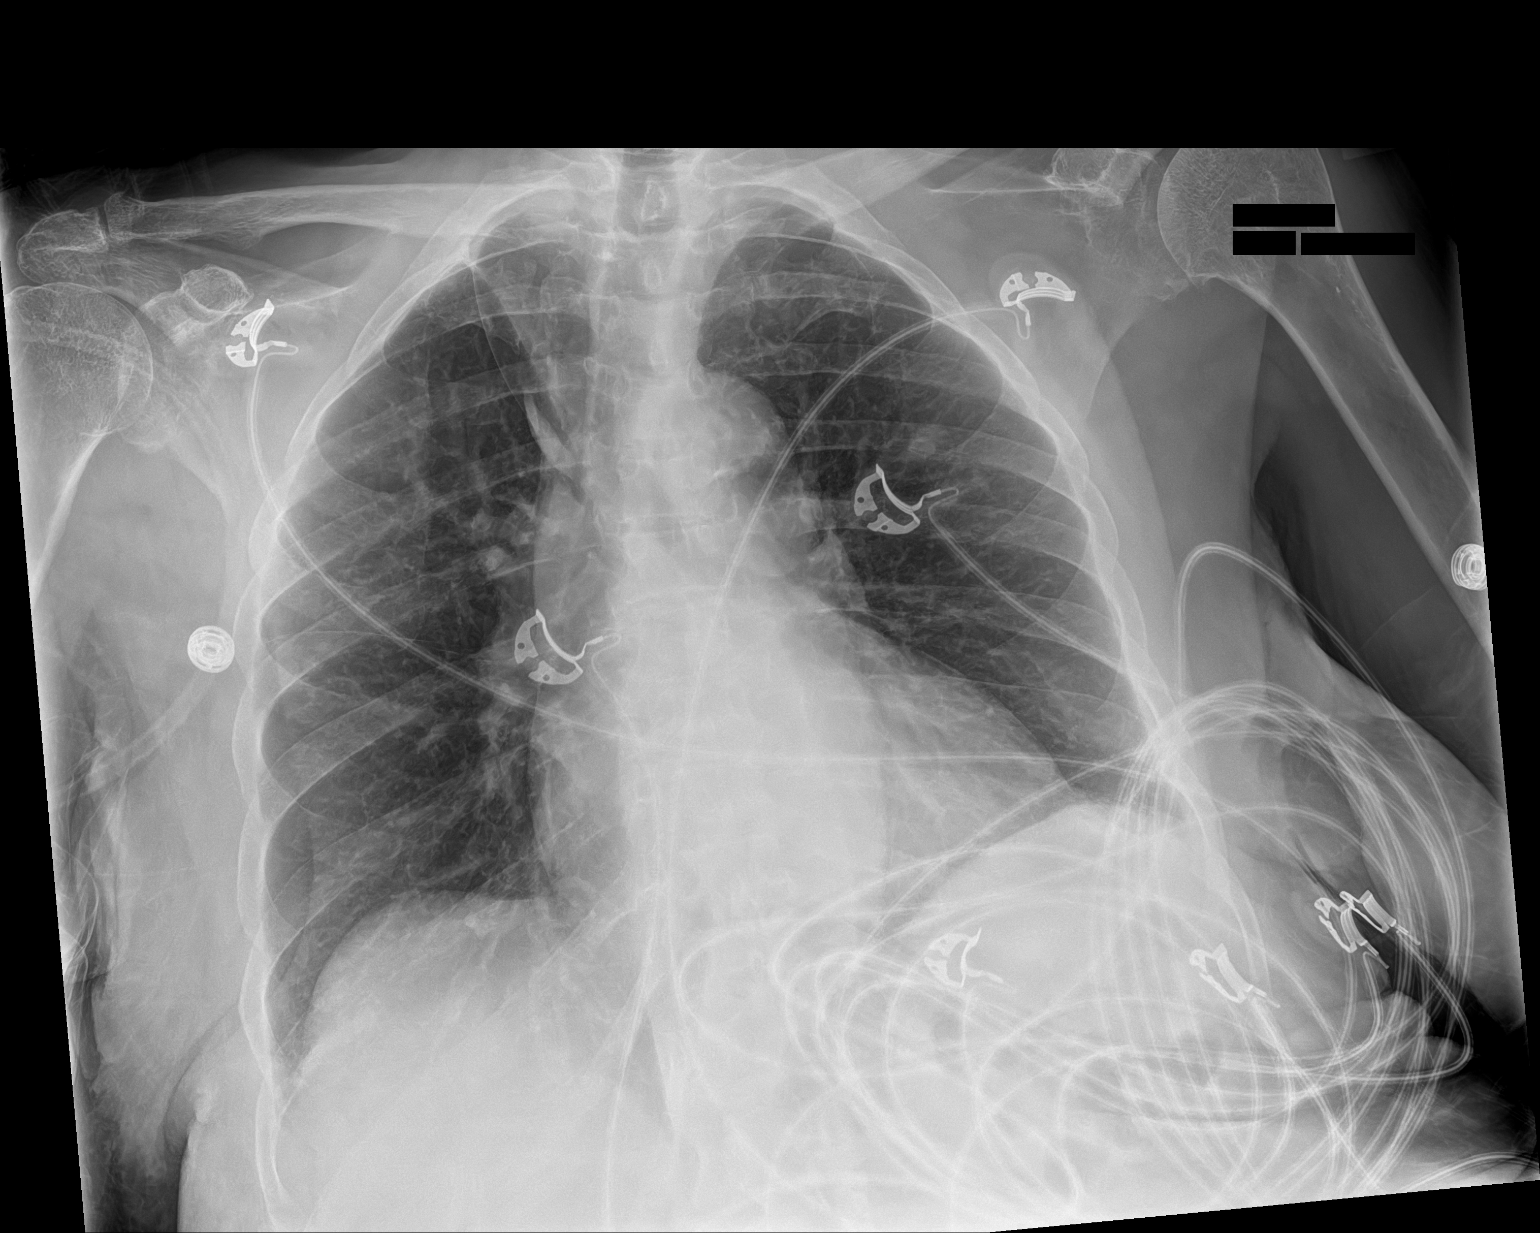

[1 of 1 positions shown; findings below may reference images not displayed]

FINDINGS: Lungs are clear. Azygos fissure, an anatomic variant. Skin fold
overlies the right lateral thorax.

Heart size and mediastinal contours are within normal limits. Aortic
Atherosclerosis ([87]-170.0).

No effusion.

Visualized bones unremarkable.
IMPRESSION: No acute cardiopulmonary disease.

## 2021-06-20 IMAGING — CT CT HEAD W/O CM
3 of 4 series · 15 of 47 positions shown, 18 images · non-contrast
Comparison: [DATE]

CLINICAL DATA: Failure to thrive, fell, head injury



[Series 2: head w o · axial · 0.43mm/px · z∈[-583,-458]mm · 9 of 31 slices shown, 12 images]
[im 3/31  brain]
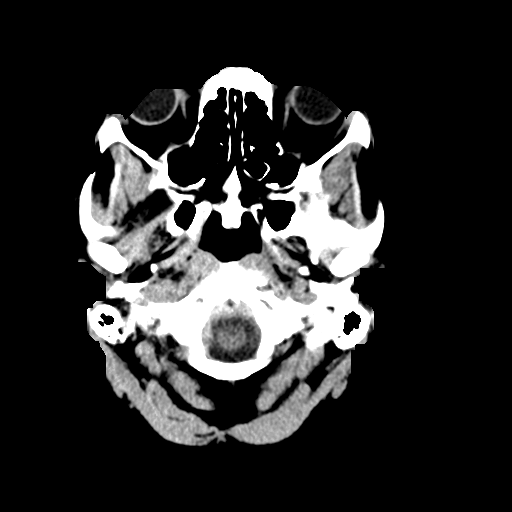
[im 3/31  bone]
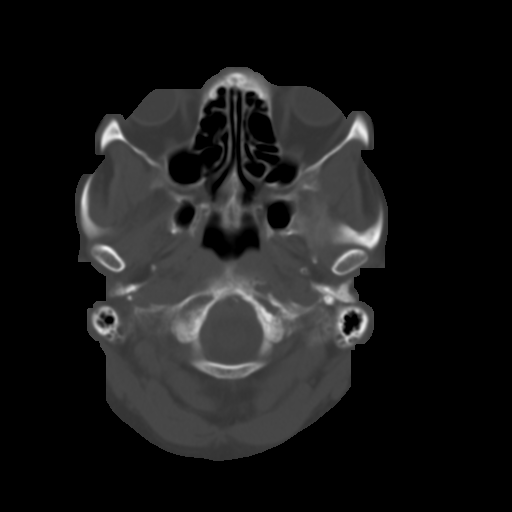
[im 7/31  brain]
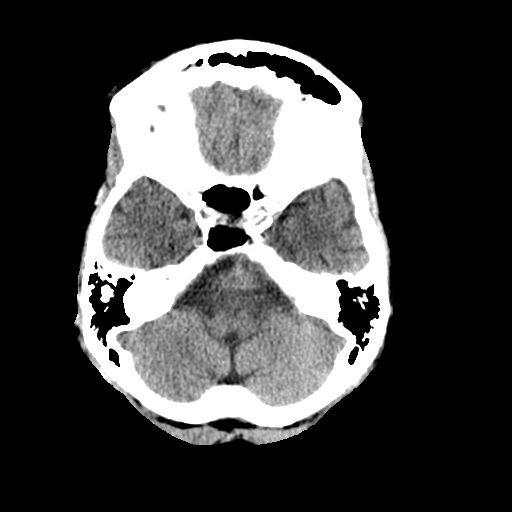
[im 9/31  brain]
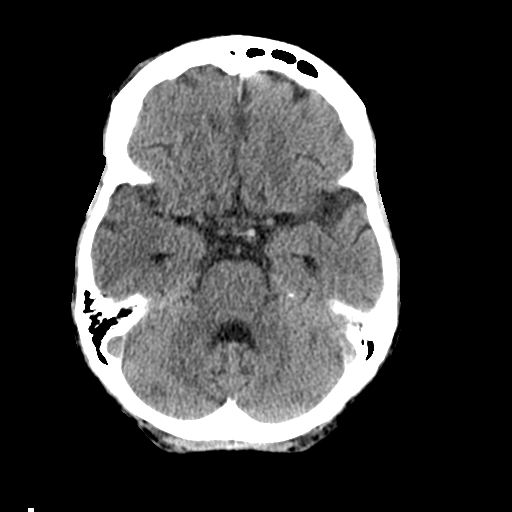
[im 13/31  brain]
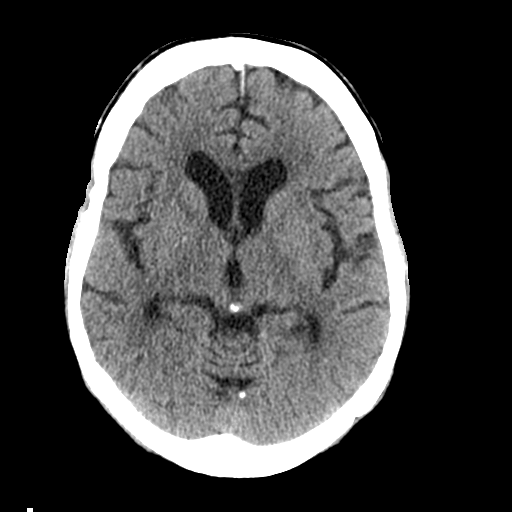
[im 16/31  brain]
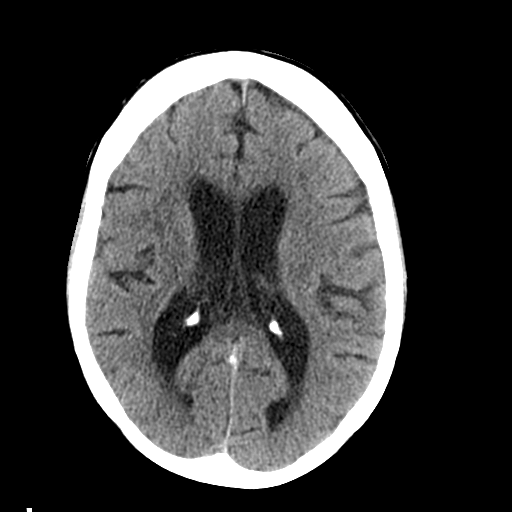
[im 16/31  bone]
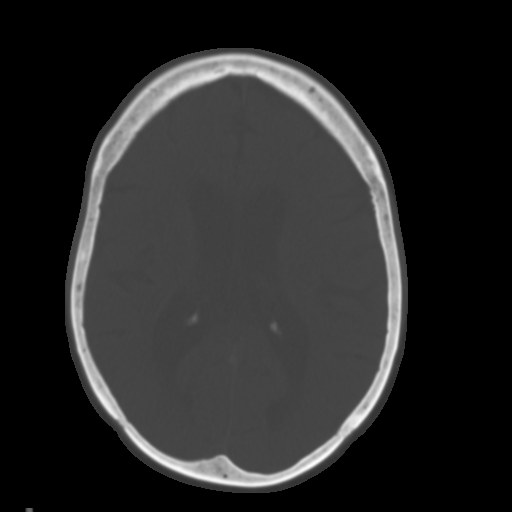
[im 18/31  brain]
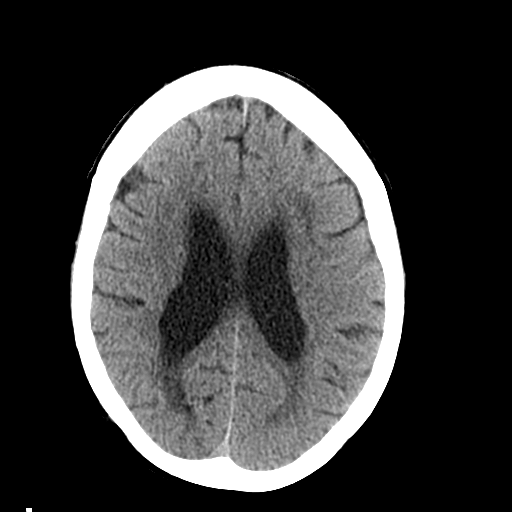
[im 22/31  brain]
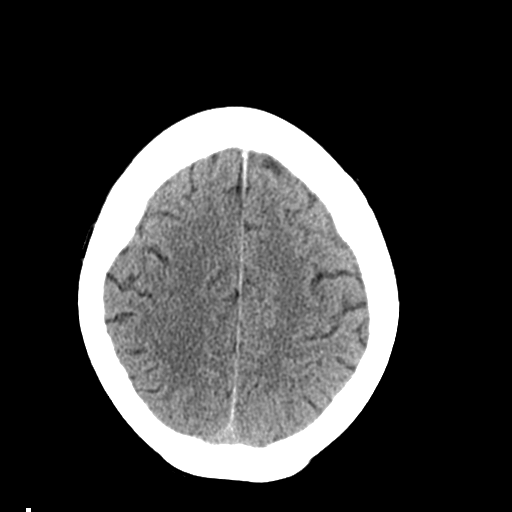
[im 24/31  brain]
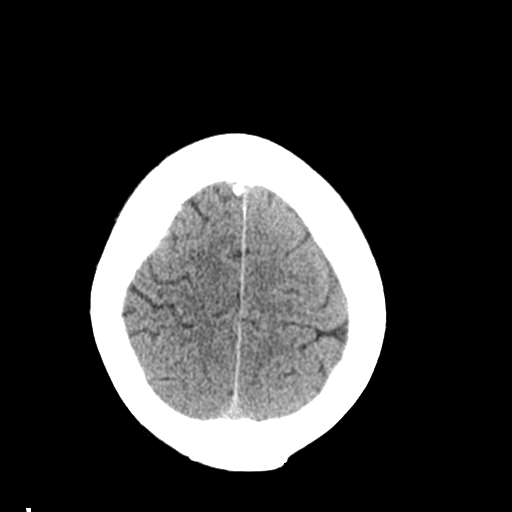
[im 28/31  brain]
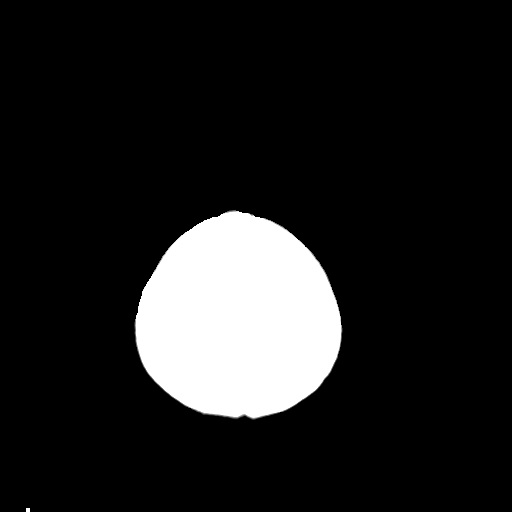
[im 28/31  bone]
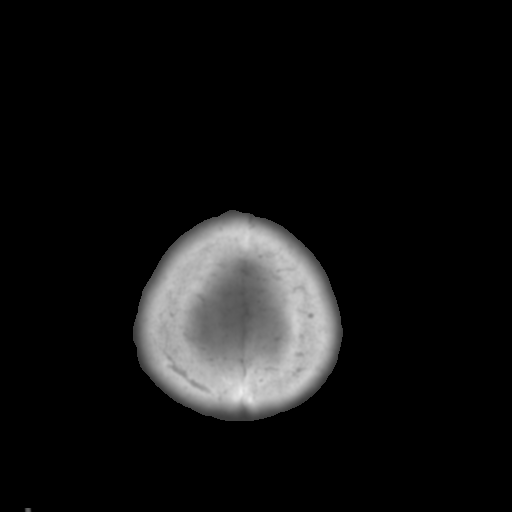

[Series 4: coronal soft · coronal · 0.31mm/px · 3 of 67 slices shown]
[im 23/67  brain]
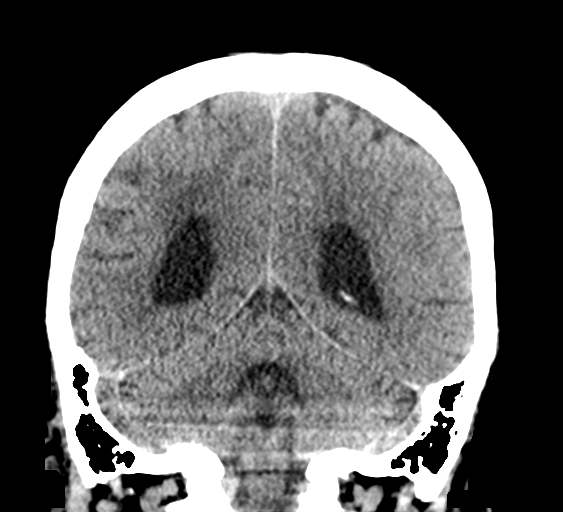
[im 30/67  brain]
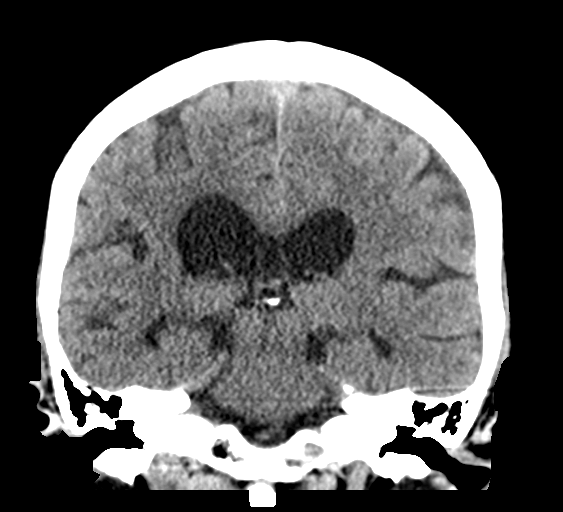
[im 37/67  brain]
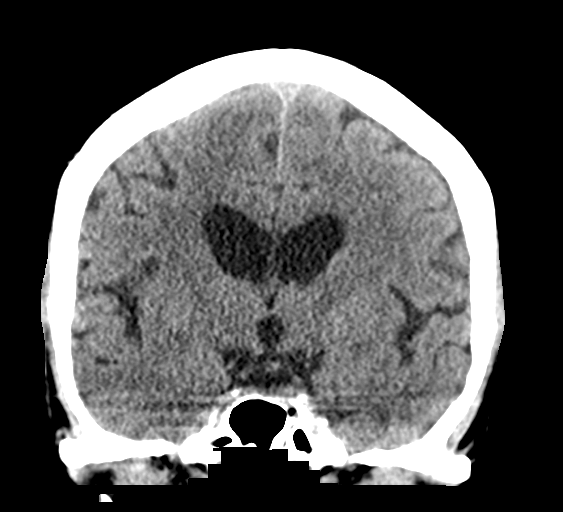

[Series 5: sagittal soft · sagittal · 0.33mm/px · 3 of 55 slices shown]
[im 19/55  brain]
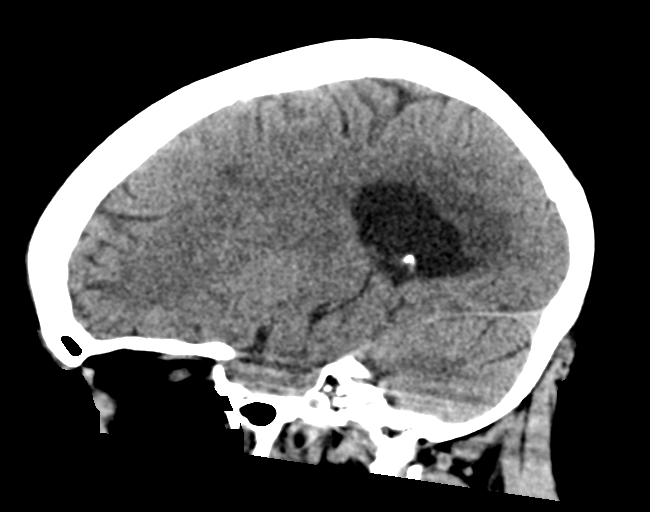
[im 28/55  brain]
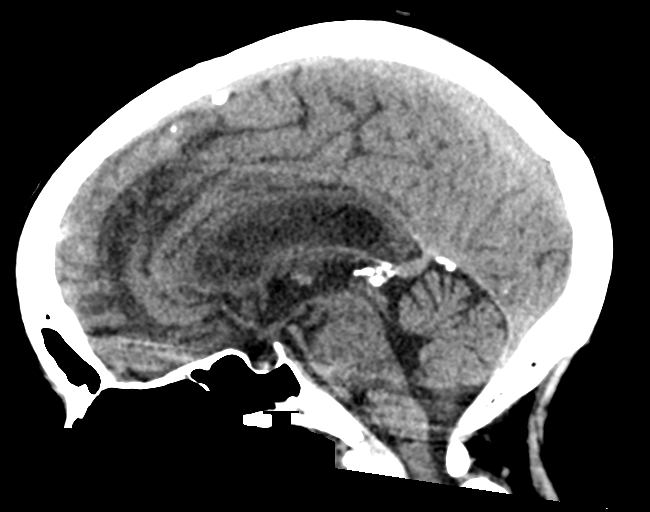
[im 37/55  brain]
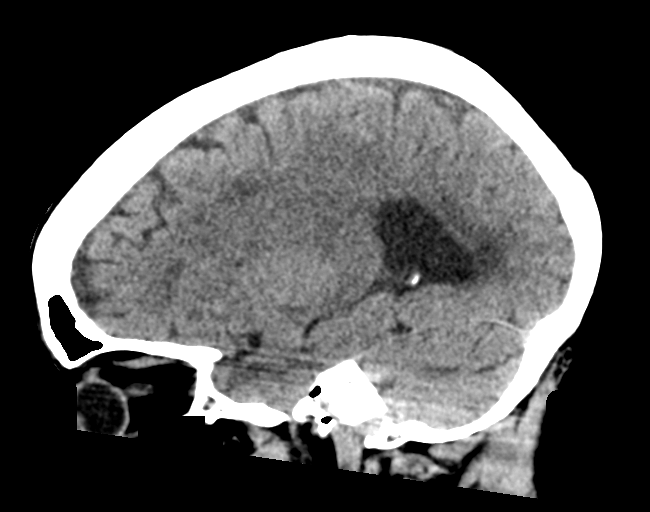

[15 of 47 positions shown; findings below may reference images not displayed]

FINDINGS: Brain: Scattered hypodensities throughout the periventricular and
subcortical white matter are stable, consistent with chronic small
vessel ischemic change. No evidence of acute infarct or hemorrhage.
Lateral ventricles and midline structures are unremarkable. No acute
extra-axial fluid collections. No mass effect.

Vascular: Stable atherosclerosis.  No hyperdense vessel.

Skull: Normal. Negative for fracture or focal lesion.

Sinuses/Orbits: No acute finding.

Other: None.
IMPRESSION: 1. Stable head CT, no acute intracranial process.

## 2021-06-20 MED ORDER — ALBUTEROL SULFATE (2.5 MG/3ML) 0.083% IN NEBU: 2.5000 mg | INHALATION_SOLUTION | RESPIRATORY_TRACT | Status: DC | PRN

## 2021-06-20 MED ORDER — QUETIAPINE FUMARATE 25 MG PO TABS
12.5000 mg | ORAL_TABLET | Freq: Every day | ORAL | Status: DC
  Administered 2021-06-20 – 2021-06-22 (×3): 12.5 mg via ORAL
  Filled 2021-06-20 (×3): qty 1

## 2021-06-20 MED ORDER — ACETAMINOPHEN 650 MG RE SUPP: 650.0000 mg | Freq: Four times a day (QID) | RECTAL | Status: DC | PRN

## 2021-06-20 MED ORDER — DIVALPROEX SODIUM 250 MG PO DR TAB
250.0000 mg | DELAYED_RELEASE_TABLET | Freq: Two times a day (BID) | ORAL | Status: DC
  Administered 2021-06-20 – 2021-06-23 (×6): 250 mg via ORAL
  Filled 2021-06-20 (×6): qty 1

## 2021-06-20 MED ORDER — HYDRALAZINE HCL 25 MG PO TABS
50.0000 mg | ORAL_TABLET | Freq: Three times a day (TID) | ORAL | Status: DC
  Administered 2021-06-20 – 2021-06-23 (×8): 50 mg via ORAL
  Filled 2021-06-20 (×8): qty 2

## 2021-06-20 MED ORDER — SODIUM CHLORIDE 0.9 % IV SOLN: INTRAVENOUS | Status: DC

## 2021-06-20 MED ORDER — DEXTROSE-NACL 5-0.9 % IV SOLN: INTRAVENOUS | Status: DC

## 2021-06-20 MED ORDER — ACETAMINOPHEN 325 MG PO TABS: 650.0000 mg | ORAL_TABLET | Freq: Four times a day (QID) | ORAL | Status: DC | PRN

## 2021-06-20 MED ORDER — CLONAZEPAM 0.25 MG PO TBDP
0.2500 mg | ORAL_TABLET | Freq: Two times a day (BID) | ORAL | Status: DC | PRN
  Administered 2021-06-20 – 2021-06-23 (×4): 0.25 mg via ORAL
  Filled 2021-06-20 (×4): qty 1

## 2021-06-20 MED ORDER — HEPARIN SODIUM (PORCINE) 5000 UNIT/ML IJ SOLN
5000.0000 [IU] | Freq: Three times a day (TID) | INTRAMUSCULAR | Status: DC
  Administered 2021-06-20 – 2021-06-21 (×3): 5000 [IU] via SUBCUTANEOUS
  Filled 2021-06-20 (×3): qty 1

## 2021-06-20 MED ORDER — LABETALOL HCL 200 MG PO TABS
100.0000 mg | ORAL_TABLET | Freq: Two times a day (BID) | ORAL | Status: DC
  Administered 2021-06-20 – 2021-06-23 (×6): 100 mg via ORAL
  Filled 2021-06-20 (×6): qty 1

## 2021-06-20 MED ORDER — ONDANSETRON HCL 4 MG/2ML IJ SOLN: 4.0000 mg | Freq: Four times a day (QID) | INTRAMUSCULAR | Status: DC | PRN

## 2021-06-20 MED ORDER — ONDANSETRON HCL 4 MG PO TABS: 4.0000 mg | ORAL_TABLET | Freq: Four times a day (QID) | ORAL | Status: DC | PRN

## 2021-06-20 MED ORDER — OXYCODONE HCL 5 MG PO TABS
5.0000 mg | ORAL_TABLET | ORAL | Status: DC | PRN
  Administered 2021-06-20 – 2021-06-22 (×3): 5 mg via ORAL
  Filled 2021-06-20 (×3): qty 1

## 2021-06-20 NOTE — ED Provider Notes (Signed)
[facility] EMERGENCY DEPARTMENT Provider Note   CSN: [id number] Arrival date & time: 06/20/21  1107     History  CC: Weakness   Allison Watson is a 81 y.o. female presenting from home with concern for failure to thrive and confusion and weakness.  The patient is an extremely poor historian and cannot provide any further information upon her arrival.  Supplemental history provided by the paramedics, and her son at the bedside, who reports the patient was referred into the ED by her PCP today after an office visit with concern for continued failure to thrive.  Her son reports patient has had continued weakness primarily the lower extremities to the point that she is not able to bear any weight at all or ambulate.  She was walking independently he states 2 months ago.  Per my review of the external records, the patient was hospitalized at the start of this month with concern for intraparenchymal hemorrhage of the brain and acute metabolic encephalopathy, as well as recurring falls and UTI.  Neurosurgery been consulted recommending no neurosurgical intervention.  The patient was treated with IV Rocephin and then Keflex for UTI.  Her falls were felt to be multifactorial to her infection and polypharmacy.  She also was noted to have possible acute metabolic encephalopathy, although her ammonia level, thyroid studies were grossly unremarkable.  She subsequently was seen in the ER on February 16, 11 days ago, with concern for an unwitnessed fall.  She had a repeat CT scan of the head which showed no further concern for intraparenchymal bleeding.  Her COVID test did return positive, which was a new diagnosis (negative on earlier admission in Feb).  Her son was unaware of this diagnosis  HPI     Home Medications Prior to Admission medications   Medication Sig Start Date End Date Taking? Authorizing Provider  albuterol (VENTOLIN HFA) 108 (90 Base) MCG/ACT inhaler Inhale 1-2 puffs into the lungs  every 6 (six) hours as needed for wheezing or shortness of breath. Will use when too hot/ too cold outside   Yes [provider]  cholecalciferol (VITAMIN D3) 25 MCG (1000 UNIT) tablet Take 1,000 Units by mouth daily.   Yes [provider]  clonazePAM (KLONOPIN) 1 MG tablet Take 1 mg by mouth 2 (two) times daily as needed for anxiety. 05/20/21  Yes [provider]  cyanocobalamin (,VITAMIN B-12,) 1000 MCG/ML injection Inject 1,000 mcg into the skin every 30 (thirty) days. 12/09/20  Yes [provider]  divalproex (DEPAKOTE) 250 MG DR tablet Take 1 tablet (250 mg total) by mouth every 12 (twelve) hours. 03/31/21 06/20/21 Yes Shahmehdi, Seyed A, MD  hydrALAZINE (APRESOLINE) 50 MG tablet Take 1 tablet (50 mg total) by mouth 3 (three) times daily. 06/01/21  Yes Hosie Poisson, MD  labetalol (NORMODYNE) 100 MG tablet Take 1 tablet (100 mg total) by mouth 2 (two) times daily. 10/09/20  Yes Roxan Hockey, MD  Multiple Vitamin (MULTIVITAMIN) tablet Take 1 tablet by mouth daily.   Yes [provider]  QUEtiapine (SEROQUEL) 25 MG tablet Take 0.5 tablets (12.5 mg total) by mouth at bedtime. 06/01/21  Yes Hosie Poisson, MD      Allergies    Codeine, Lorazepam, and Penicillins    Review of Systems   Review of Systems  Physical Exam Updated Vital Signs BP 115/71    Pulse 79    Temp 98.6 F (37 C) (Oral)    Resp (!) 26    Ht 5' (  1.524 m)    Wt 72.6 kg    SpO2 97%    BMI 31.26 kg/m  Physical Exam Constitutional:      General: She is not in acute distress.    Comments: Appears catatoniic, will not speak to me, eyes closed  HENT:     Head: Normocephalic and atraumatic.  Eyes:     Conjunctiva/sclera: Conjunctivae normal.     Pupils: Pupils are equal, round, and reactive to light.  Cardiovascular:     Rate and Rhythm: Normal rate and regular rhythm.  Pulmonary:     Effort: Pulmonary effort is normal. No respiratory distress.  Abdominal:     General: There is no  distension.     Tenderness: There is no abdominal tenderness.  Skin:    General: Skin is warm and dry.  Neurological:     Mental Status: She is alert.    ED Results / Procedures / Treatments   Labs (all labs ordered are listed, but only abnormal results are displayed) Labs Reviewed  COMPREHENSIVE METABOLIC PANEL - Abnormal; Notable for the following components:      Result Value   Sodium 148 (*)    Chloride 112 (*)    BUN 26 (*)    Creatinine, Ser 1.65 (*)    GFR, Estimated 32 (*)    All other components within normal limits  CBC WITH DIFFERENTIAL/PLATELET - Abnormal; Notable for the following components:   MCH 25.9 (*)    RDW 16.9 (*)    Platelets 100 (*)    All other components within normal limits  MAGNESIUM  URINALYSIS, ROUTINE W REFLEX MICROSCOPIC    EKG None  Radiology DG Chest Portable 1 View  Result Date: 06/20/2021 CLINICAL DATA:  Possible aspiration. Sob. Failure to thrive EXAM: PORTABLE CHEST - 1 VIEW COMPARISON:  06/09/2021 FINDINGS: Lungs are clear. Azygos fissure, an anatomic variant. Skin fold overlies the right lateral thorax. Heart size and mediastinal contours are within normal limits. Aortic Atherosclerosis (ICD10-170.0). No effusion. Visualized bones unremarkable. IMPRESSION: No acute cardiopulmonary disease. Electronically Signed   By: Lucrezia Europe M.D.   On: 06/20/2021 15:33    Procedures Procedures    Medications Ordered in ED Medications - No data to display  ED Course/ Medical Decision Making/ A&P Clinical Course as of 06/20/21 1547  Mon Jun 20, 2021  1509 My reassessment was made aware by the patient's son as well as PCP NP by phone that the patient had another fall 3 days ago and may have struck her head at the time.  Given that she is altered I have ordered a repeat CT scan of the head.  If this is unremarkable anticipate she will require admission regardless for failure to thrive, high aspiration risk as her son reports that she is not able to  swallow her pills.  Also has had a significant deterioration in her physical and mental function [MT]  1546 Signed out to Dr Almyra Free EDP pending [facility], anticipate admission for failure to thrive/covid syndrome, speech & swallow evaluation, and likely SW for placement.   [MT]    Clinical Course User Index [MT] Nehan Flaum, Carola Rhine, MD                           Medical Decision Making Amount and/or Complexity of Data Reviewed Labs: ordered. Radiology: ordered.  Risk Decision regarding hospitalization.   Patient is here with altered mental status, failure to thrive She appears to  have dementia from report -I suspect her issues are multifactorial; she may have worsening of her generalized weakness and failure to thrive and poor appetite in the setting of her COVID illness.  COVID test was positive earlier this month, 12 days ago, unlikely to be contagious at this point, but may have exacerbated the patient's underlying dementia and refusal to eat or drink.  External records personally reviewed including hospital course in February 2023 as noted above.  Supplemental history provided by paramedics and patient's son at the bedside.  I have a lower suspicion for acute stroke or recurring intracranial hemorrhage or bleed, she had an unremarkable CT scan of the head 12 days ago with similar symptoms.  I personally reviewed and interpreted patient's labs and imaging, notable for Na elevated, hgb at baseline.         Final Clinical Impression(s) / ED Diagnoses Final diagnoses:  Dysphagia, unspecified type  Weakness  Injury of head, initial encounter    Rx / DC Orders ED Discharge Orders     None         Wyvonnia Dusky, MD 06/20/21 [phone]

## 2021-06-20 NOTE — Assessment & Plan Note (Addendum)
Treated with gentle IV hydration Encouraged oral intake and assist with feeding/drinking

## 2021-06-20 NOTE — Assessment & Plan Note (Addendum)
Family reports that patient continues to have recurrent falls at home Last fall was 3 days prior to admission CT head repeated and shows no acute changes Patient is not complaining of any pain PT eval and treat and recommending SNF Plan for placement at [facility]

## 2021-06-20 NOTE — Assessment & Plan Note (Addendum)
Soft foods diet recommended.  Pt was seen by SLP and eventually advanced to soft diet which she seems to tolerate at this time.

## 2021-06-20 NOTE — ED Provider Notes (Signed)
CT imaging of the brain is unremarkable.  Patient was signed out to me is pending resolution of her studies.  On my exam she is awake and alert and does try to answer questions but appears very confused.  Patient will be admitted to the hospitalist team for failure to thrive, decreased oral intake.   Luna Fuse, MD 06/20/21 [phone]

## 2021-06-20 NOTE — ED Triage Notes (Signed)
Pt brought to ED for failure to thrive. Pt has been unable to stand and walk, unable to eat, legs swollen. Daughter states she has steadily declined since her stroke.

## 2021-06-20 NOTE — Assessment & Plan Note (Addendum)
Continue hydralazine and labetalol

## 2021-06-20 NOTE — Assessment & Plan Note (Addendum)
Diagnosed more than 10 days prior to admission Incidental Case at this time.  No oxygen requirement at this time Patient is vaccinated for COVID

## 2021-06-20 NOTE — Assessment & Plan Note (Addendum)
Patient has not been eating and drinking well   Assist with feeding orders recommended Nutritional supplements and encourage oral drinking three times daily recommended.  Palliative consult requested for goals of care.  PEG not recommended at this time and family agreeable to outpatient palliative care with [facility].

## 2021-06-20 NOTE — ED Notes (Signed)
NP called and reported that pt has been having several falls over the last month. She had a small brain bleed in the beginning of this month after a fall. She had another fall on Friday morning and has had even more decline since her last fall. She has also had increasing weakness and has become a max assist. NP concerned she needs another scan of her head.

## 2021-06-20 NOTE — H&P (Signed)
3 History and Physical    Patient: Allison Watson:[id number] DOB: 08/27/1940 DOA: 06/20/2021 DOS: the patient was seen and examined on 06/20/2021 PCP: Celene Squibb, MD  Patient coming from: Home  Chief Complaint: No chief complaint on file.   HPI: Allison Watson is a 81 y.o. female with medical history significant of history of gout, hypertension, falls with recent admission for intraparenchymal bleed, dementia with behavioral disturbance requiring Seroquel, Klonopin, MR presents ED with a chief complaint of declining condition.  Family has been complaining that patient is declining for several months.  Today, they report the progression has been slightly more brisk than normal.  Patient was walking a little on Friday.  Today she is not able to walk at all.  They report her speech is impaired.  Reported baseline she may not answer you, may not hold a normal conversation.  They are describing the speech impairment is muffled speech.  Patient is also having difficulty swallowing.  They report that she is spitting out her food even when it pured.  She is spitting out her pills even when they put them in applesauce.  Up until a couple months ago she was taking her pills as whole pills with water.  Family is concerned that she is only eating 1-2 teaspoons of pured foods per day after and the amount that she spits out.  They think that she has been more somnolent.  They noticed lipsmacking and hands twitching but this has been going on for several months.  This is likely why patient is on Depakote.  Family went to PCP office to discuss all of this today, and were advised to come to the ED.  Family is interested in placement.  They are interested in PEG tube if necessary.  Review of Systems: unable to review all systems due to the inability of the patient to answer questions. Past Medical History:  Diagnosis Date   Arthritis    Lt arm   Gout    Hypertension    Past Surgical History:  Procedure  Laterality Date   ABDOMINAL HYSTERECTOMY     TUBAL LIGATION     Social History:  reports that she has quit smoking. She has never used smokeless tobacco. She reports that she does not drink alcohol and does not use drugs.  Allergies  Allergen Reactions   Codeine Rash   Lorazepam Other (See Comments)    Hallucinations per daughter [name]   Penicillins Rash    Has patient had a PCN reaction causing immediate rash, facial/tongue/throat swelling, SOB or lightheadedness with hypotension: Yes Has patient had a PCN reaction causing severe rash involving mucus membranes or skin necrosis: No Has patient had a PCN reaction that required hospitalization No Has patient had a PCN reaction occurring within the last 10 years: No If all of the above answers are "NO", then may proceed with Cephalosporin use.     No family history on file.  Prior to Admission medications   Medication Sig Start Date End Date Taking? Authorizing Provider  albuterol (VENTOLIN HFA) 108 (90 Base) MCG/ACT inhaler Inhale 1-2 puffs into the lungs every 6 (six) hours as needed for wheezing or shortness of breath. Will use when too hot/ too cold outside   Yes [provider]  cholecalciferol (VITAMIN D3) 25 MCG (1000 UNIT) tablet Take 1,000 Units by mouth daily.   Yes [provider]  clonazePAM (KLONOPIN) 1 MG tablet Take 1 mg by mouth 2 (two) times daily  as needed for anxiety. 05/20/21  Yes [provider]  cyanocobalamin (,VITAMIN B-12,) 1000 MCG/ML injection Inject 1,000 mcg into the skin every 30 (thirty) days. 12/09/20  Yes [provider]  divalproex (DEPAKOTE) 250 MG DR tablet Take 1 tablet (250 mg total) by mouth every 12 (twelve) hours. 03/31/21 06/20/21 Yes Shahmehdi, Seyed A, MD  hydrALAZINE (APRESOLINE) 50 MG tablet Take 1 tablet (50 mg total) by mouth 3 (three) times daily. 06/01/21  Yes Hosie Poisson, MD  labetalol (NORMODYNE) 100 MG tablet Take 1 tablet (100 mg total) by mouth 2  (two) times daily. 10/09/20  Yes Roxan Hockey, MD  Multiple Vitamin (MULTIVITAMIN) tablet Take 1 tablet by mouth daily.   Yes [provider]  QUEtiapine (SEROQUEL) 25 MG tablet Take 0.5 tablets (12.5 mg total) by mouth at bedtime. 06/01/21  Yes Hosie Poisson, MD    Physical Exam: Vitals:   06/20/21 1500 06/20/21 1530 06/20/21 1630 06/20/21 1930  BP: (!) 120/36 115/71 125/85 130/70  Pulse: 60 79 79 72  Resp: 11 (!) 26 18 14   Temp:      TempSrc:      SpO2: 93% 97% 97% 97%  Weight:      Height:       1.  General: Patient lying supine in bed,  no acute distress   2. Psychiatric: Alert and oriented to self, flat affect, but cooperative with exam   3. Neurologic: Speech and language are normal (son at bedside agrees that she sounds normal at this time as well), face is symmetric, moves all 4 extremities voluntarily, at baseline without acute deficits on limited exam   4. HEENMT:  Head is atraumatic, normocephalic, pupils reactive to light, neck is cachectic, trachea is midline, mucous membranes are dry  5. Respiratory : Lungs are clear to auscultation bilaterally without wheezing, rhonchi, rales, no cyanosis, no increase in work of breathing or accessory muscle use   6. Cardiovascular : Heart rate normal, rhythm is regular, no murmurs, rubs or gallops, no peripheral edema, peripheral pulses palpated   7. Gastrointestinal:  Abdomen is soft, nondistended, nontender to palpation bowel sounds active, no masses or organomegaly palpated   8. Skin:  Skin is warm, dry and intact without rashes, acute lesions, or ulcers on limited exam   9.Musculoskeletal:  No acute deformities or trauma, no asymmetry in tone, no peripheral edema, peripheral pulses palpated, no tenderness to palpation in the extremities   Data Reviewed:  In the ED Temp 98.6, heart rate 60-80, respiratory rate 11-26, blood pressure 125/85, satting 97% No leukocytosis with a white blood cell count of 4.8,  hemoglobin 13.0, platelets 100 Chemistry panel reveals an elevated creatinine of 1.65 and a glucose 71 UA shows 80 ketones, 30 protein CT head is stable Chest x-ray shows no acute cardiopulmonary disease   Assessment and Plan: * Failure to thrive in adult- (present on admission) Patient has not been eating for 3 days.   Ketonuria has evidence of poor p.o. intake Patient has become more weak Likely partially attributable to recent COVID-19 diagnosis 10 days ago Continue to monitor electrolytes and replace as needed Dehydration also contributing with a BUN to creatinine ratio 26:1 0.65-continue gentle hydration IV So far no evidence of infection Patient has been borderline hypoglycemic with a glucose of 71, starting D5 normal saline Continue to monitor  Dysphagia Family reports that they believe p.o. intake reduction is due to dysphagia Patient spitting out her meds, spitting out food They are interested in a  PEG tube if needed Consult speech for swallow study Continue to monitor  Dehydration- (present on admission) Continue gentle IV hydration Monitor clinical volume Status Slight bump in creatinine to 1.65 from 1.43 Ketonuria is evidence of 4 p.o. intake Patient appears clinically dry  COVID-19 virus infection- (present on admission) Diagnosed 10 days ago Continue supportive care No oxygen requirement at this time Patient is vaccinated for COVID  Recurrent falls Family reports that patient continues to have recurrent falls Last fall was 3 days ago CT head repeated today shows no acute changes Patient is not complaining of any pain PT eval and treat Plan for placement at SNF  Essential hypertension- (present on admission) Continue hydralazine and labetalol       Advance Care Planning:   Code Status: Prior full  Consults: None  Family Communication: Son at bedside  Severity of Illness: The appropriate patient status for this patient is INPATIENT. Inpatient  status is judged to be reasonable and necessary in order to provide the required intensity of service to ensure the patient's safety. The patient's presenting symptoms, physical exam findings, and initial radiographic and laboratory data in the context of their chronic comorbidities is felt to place them at high risk for further clinical deterioration. Furthermore, it is not anticipated that the patient will be medically stable for discharge from the hospital within 2 midnights of admission.   * I certify that at the point of admission it is my clinical judgment that the patient will require inpatient hospital care spanning beyond 2 midnights from the point of admission due to high intensity of service, high risk for further deterioration and high frequency of surveillance required.*  Author: Rolla Plate, DO 06/20/2021 9:00 PM  For on call review [email or web address].

## 2021-06-21 DIAGNOSIS — R627 Adult failure to thrive: Secondary | ICD-10-CM | POA: Diagnosis not present

## 2021-06-21 LAB — CBC WITH DIFFERENTIAL/PLATELET
Abs Immature Granulocytes: 0.01 10*3/uL (ref 0.00–0.07)
Basophils Absolute: 0 10*3/uL (ref 0.0–0.1)
Basophils Relative: 1 %
Eosinophils Absolute: 0.1 10*3/uL (ref 0.0–0.5)
Eosinophils Relative: 4 %
HCT: 43.3 % (ref 36.0–46.0)
Hemoglobin: 13.4 g/dL (ref 12.0–15.0)
Immature Granulocytes: 0 %
Lymphocytes Relative: 56 %
Lymphs Abs: 2 10*3/uL (ref 0.7–4.0)
MCH: 26.7 pg (ref 26.0–34.0)
MCHC: 30.9 g/dL (ref 30.0–36.0)
MCV: 86.4 fL (ref 80.0–100.0)
Monocytes Absolute: 0.4 10*3/uL (ref 0.1–1.0)
Monocytes Relative: 12 %
Neutro Abs: 1 10*3/uL — ABNORMAL LOW (ref 1.7–7.7)
Neutrophils Relative %: 27 %
Platelets: 75 10*3/uL — ABNORMAL LOW (ref 150–400)
RBC: 5.01 MIL/uL (ref 3.87–5.11)
RDW: 17.3 % — ABNORMAL HIGH (ref 11.5–15.5)
WBC: 3.7 10*3/uL — ABNORMAL LOW (ref 4.0–10.5)
nRBC: 0 % (ref 0.0–0.2)

## 2021-06-21 LAB — COMPREHENSIVE METABOLIC PANEL
ALT: 18 U/L (ref 0–44)
AST: 21 U/L (ref 15–41)
Albumin: 3.6 g/dL (ref 3.5–5.0)
Alkaline Phosphatase: 37 U/L — ABNORMAL LOW (ref 38–126)
Anion gap: 13 (ref 5–15)
BUN: 21 mg/dL (ref 8–23)
CO2: 24 mmol/L (ref 22–32)
Calcium: 10 mg/dL (ref 8.9–10.3)
Chloride: 111 mmol/L (ref 98–111)
Creatinine, Ser: 1.38 mg/dL — ABNORMAL HIGH (ref 0.44–1.00)
GFR, Estimated: 39 mL/min — ABNORMAL LOW (ref >60)
Glucose, Bld: 85 mg/dL (ref 70–99)
Potassium: 3.7 mmol/L (ref 3.5–5.1)
Sodium: 148 mmol/L — ABNORMAL HIGH (ref 135–145)
Total Bilirubin: 0.6 mg/dL (ref 0.3–1.2)
Total Protein: 6.7 g/dL (ref 6.5–8.1)

## 2021-06-21 LAB — MAGNESIUM: Magnesium: 1.9 mg/dL (ref 1.7–2.4)

## 2021-06-21 MED ORDER — ENSURE MAX PROTEIN PO LIQD
11.0000 [oz_av] | Freq: Every day | ORAL | Status: DC
  Administered 2021-06-21 – 2021-06-23 (×2): 11 [oz_av] via ORAL

## 2021-06-21 MED ORDER — DEXTROSE-NACL 5-0.45 % IV SOLN: INTRAVENOUS | Status: DC

## 2021-06-21 MED ORDER — ENSURE ENLIVE PO LIQD
237.0000 mL | Freq: Two times a day (BID) | ORAL | Status: DC
  Administered 2021-06-21 – 2021-06-23 (×3): 237 mL via ORAL

## 2021-06-21 NOTE — NC FL2 (Signed)
[place] LEVEL OF CARE SCREENING TOOL     IDENTIFICATION  Patient Name: Allison Watson Birthdate: 1940-11-09 Sex: female Admission Date (Current Location): 06/20/2021  [facility] and [place] Number:  [employer] and Address:  [facility] [address], [place]      Provider Number: [id number]  Attending Physician Name and Address:  Roxan Hockey, MD  Relative Name and Phone Number:       Current Level of Care: Hospital Recommended Level of Care: [place] Prior Approval Number:    Date Approved/Denied:   PASRR Number: [id number]  Discharge Plan: SNF    Current Diagnoses: Patient Active Problem List   Diagnosis Date Noted   Failure to thrive in adult 06/20/2021   COVID-19 virus infection 06/20/2021   Dehydration 06/20/2021   Dysphagia 06/20/2021   Recurrent falls 05/31/2021   UTI (urinary tract infection) 05/31/2021   Intraparenchymal hemorrhage of brain ([place]) 05/30/2021   CKD (chronic kidney disease), stage IV ([place]) 85/88/5027   Toxic metabolic encephalopathy 74/03/8785   Altered mental status 10/11/2020   Obesity (BMI 30-39.9) 10/11/2020   Hypomagnesemia 10/11/2020   Normocytic anemia 10/11/2020   Anemia due to chronic kidney disease 10/11/2020   Psychosis ([place]) 76/72/0947   Acute metabolic encephalopathy 09/62/8366   Shoulder pain 02/20/2019   INSOMNIA, CHRONIC 05/08/2007   BRONCHITIS, ACUTE WITH BRONCHOSPASM 04/16/2007   CELLULITIS/ABSCESS, TRUNK 10/31/2006   MUSCLE SPASM 10/31/2006   Essential hypertension 03/13/2006   ALLERGIC RHINITIS 03/13/2006   Osteoarthrosis, unspecified whether generalized or localized, unspecified site 03/13/2006   ARTHRITIS 03/13/2006    Orientation RESPIRATION BLADDER Height & Weight     Self  Normal Incontinent Weight: 160 lb 0.9 oz (72.6 kg) Height:  5' (152.4 cm)  BEHAVIORAL SYMPTOMS/MOOD NEUROLOGICAL BOWEL NUTRITION STATUS      Continent Diet (see  dc summary)  AMBULATORY STATUS COMMUNICATION OF NEEDS Skin   Extensive Assist Verbally Normal                       Personal Care Assistance Level of Assistance  Bathing, Feeding, Dressing Bathing Assistance: Limited assistance Feeding assistance: Limited assistance Dressing Assistance: Limited assistance     Functional Limitations Info  Sight, Hearing, Speech Sight Info: Impaired Hearing Info: Adequate Speech Info: Adequate    SPECIAL CARE FACTORS FREQUENCY  PT (By licensed PT), OT (By licensed OT)     PT Frequency: 5x week OT Frequency: 3x week            Contractures Contractures Info: Not present    Additional Factors Info  Code Status, Allergies Code Status Info: Full Allergies Info: Codeine. Lorazepam, Penicillins           Current Medications (06/21/2021):  This is the current hospital active medication list Current Facility-Administered Medications  Medication Dose Route Frequency Provider Last Rate Last Admin   acetaminophen (TYLENOL) tablet 650 mg  650 mg Oral Q6H PRN Zierle-Ghosh, Asia B, DO       Or   acetaminophen (TYLENOL) suppository 650 mg  650 mg Rectal Q6H PRN Zierle-Ghosh, Asia B, DO       albuterol (PROVENTIL) (2.5 MG/3ML) 0.083% nebulizer solution 2.5 mg  2.5 mg Nebulization Q2H PRN Zierle-Ghosh, Asia B, DO       clonazePAM (KLONOPIN) disintegrating tablet 0.25 mg  0.25 mg Oral BID PRN Zierle-Ghosh, Asia B, DO   0.25 mg at 06/20/21 2152   dextrose 5 %-0.9 % sodium  chloride infusion   Intravenous Continuous Zierle-Ghosh, Asia B, DO 75 mL/hr at 06/21/21 0621 [employer] at 06/21/21 0621   divalproex (DEPAKOTE) DR tablet 250 mg  250 mg Oral Q12H Zierle-Ghosh, Asia B, DO   250 mg at 06/21/21 0912   feeding supplement (ENSURE ENLIVE / ENSURE PLUS) liquid 237 mL  237 mL Oral BID BM Zierle-Ghosh, Asia B, DO       heparin injection 5,000 Units  5,000 Units Subcutaneous Q8H Zierle-Ghosh, Asia B, DO   5,000 Units at 06/21/21 2010    hydrALAZINE (APRESOLINE) tablet 50 mg  50 mg Oral TID Zierle-Ghosh, Asia B, DO   50 mg at 06/21/21 0911   labetalol (NORMODYNE) tablet 100 mg  100 mg Oral BID Zierle-Ghosh, Asia B, DO   100 mg at 06/21/21 0911   ondansetron (ZOFRAN) tablet 4 mg  4 mg Oral Q6H PRN Zierle-Ghosh, Asia B, DO       Or   ondansetron (ZOFRAN) injection 4 mg  4 mg Intravenous Q6H PRN Zierle-Ghosh, Asia B, DO       oxyCODONE (Oxy IR/ROXICODONE) immediate release tablet 5 mg  5 mg Oral Q4H PRN Zierle-Ghosh, Asia B, DO   5 mg at 06/20/21 2152   QUEtiapine (SEROQUEL) tablet 12.5 mg  12.5 mg Oral QHS Zierle-Ghosh, Asia B, DO   12.5 mg at 06/20/21 2152     Discharge Medications: Please see discharge summary for a list of discharge medications.  Relevant Imaging Results:  Relevant Lab Results:   Additional Information SSN: [id number], [place]

## 2021-06-21 NOTE — Evaluation (Signed)
Clinical/Bedside Swallow Evaluation Patient Details  Name: Allison Watson MRN: [id number] Date of Birth: 10-11-1940  Today's Date: 06/21/2021 Time: SLP Start Time (ACUTE ONLY): 57 SLP Stop Time (ACUTE ONLY): 1429 SLP Time Calculation (min) (ACUTE ONLY): 14 min  Past Medical History:  Past Medical History:  Diagnosis Date   Arthritis    Lt arm   Gout    Hypertension    Past Surgical History:  Past Surgical History:  Procedure Laterality Date   ABDOMINAL HYSTERECTOMY     TUBAL LIGATION     HPI:       Assessment / Plan / Recommendation  Clinical Impression  Pt presents with primary cognitive based oral dysphagia. Pt was alert and verbally responded to SLP, however, she is pleasantly confused and disoriented X4. Pt consumed thin liquids via straw and cup without overt s/sx of aspiration consuming ~150 ccs of water. Pt consumed one tiny bite of a cracker and demonstrated very prolonged mastication and regular texture remained at anterior portion of mouth and was eventually manipulated and swallowed after presentation of pudding followed. Pt consumed pudding with prolonged AP transit and poor awareness of bolus but no overt s/sx of aspiration. Recommend downgrade Pt's diet to D1/puree secondary to AMS resulting in poor awareness of bolus, difficulty/inability to thoroughly masticate regular/solid textures. Pt is at risk for malnutrition and dehydration. However, with 1:1 feeder/suppport Pt did consume puree and thin liquids; she drank water via straw relatively quickly and question if Pt would be a good candidate for liquid supplements for nutritional benefit. Defer to RD for further nutritional recommendations. ST will continue to follow to ensure diet tolerance. SLP Visit Diagnosis: Dysphagia, unspecified (R13.10)    Aspiration Risk  Risk for inadequate nutrition/hydration;Mild aspiration risk    Diet Recommendation Dysphagia 1 (Puree);Thin liquid   Liquid Administration via:  Cup;Straw Medication Administration: Crushed with puree Supervision: Staff to assist with self feeding Compensations: Minimize environmental distractions;Small sips/bites;Slow rate    Other  Recommendations Oral Care Recommendations: Oral care BID    Recommendations for follow up therapy are one component of a multi-disciplinary discharge planning process, led by the attending physician.  Recommendations may be updated based on patient status, additional functional criteria and insurance authorization.  Follow up Recommendations Skilled nursing-short term rehab (<3 hours/day)      Assistance Recommended at Discharge Frequent or constant Supervision/Assistance  Functional Status Assessment Patient has had a recent decline in their functional status and demonstrates the ability to make significant improvements in function in a reasonable and predictable amount of time.  Frequency and Duration min 2x/week  2 weeks       Prognosis Prognosis for Safe Diet Advancement: Good Barriers to Reach Goals: Cognitive deficits      Swallow Study   General Date of Onset: 06/20/21 Type of Study: Bedside Swallow Evaluation Previous Swallow Assessment: BSE 05/31/21 Diet Prior to this Study: Dysphagia 3 (soft);Thin liquids Temperature Spikes Noted: No Respiratory Status: Room air History of Recent Intubation: No Behavior/Cognition: Alert;Cooperative;Distractible;Requires cueing Oral Cavity Assessment: Within Functional Limits Oral Care Completed by SLP: Recent completion by staff Oral Cavity - Dentition: Adequate natural dentition Vision: Functional for self-feeding Self-Feeding Abilities: Total assist Patient Positioning: Upright in bed Baseline Vocal Quality: Normal Volitional Cough: Cognitively unable to elicit Volitional Swallow: Unable to elicit    Oral/Motor/Sensory Function Overall Oral Motor/Sensory Function: Other (comment)   Ice Chips Ice chips: Not tested   Thin Liquid Thin Liquid:  Within functional limits    Nectar Thick  Nectar Thick Liquid: Not tested   Honey Thick Honey Thick Liquid: Not tested   Puree Puree: Impaired Presentation: Spoon Oral Phase Impairments: Poor awareness of bolus   Solid     Solid: Impaired Presentation: Spoon Oral Phase Impairments: Impaired mastication     Standley Bargo H. Roddie Mc, CCC-SLP Speech Language Pathologist  Wende Bushy 06/21/2021,3:07 PM

## 2021-06-21 NOTE — Progress Notes (Signed)
Initial Nutrition Assessment  DOCUMENTATION CODES:   Non-severe (moderate) malnutrition in context of acute illness/injury  INTERVENTION:  Ensure Enlive po TID, each supplement provides 350 kcal and 20 grams of protein.   Assistance with meals and oral supplements per ST evaluation   Multivitamin daily    NUTRITION DIAGNOSIS:   Moderate Malnutrition related to acute illness (altered mental status, recent UTI and acute metabolic encephalopathy) as evidenced by energy intake < 75% for > 7 days, mild muscle depletion.   GOAL:  Patient will meet greater than or equal to 90% of their needs  MONITOR:  PO intake, Supplement acceptance, Labs, Weight trends  REASON FOR ASSESSMENT:   Malnutrition Screening Tool    ASSESSMENT: Patient is a 81 yo female with hx of HTN,Gout and arthritis. COVID- positive, altered mental status, decrease in oral intake 3 days and spitting out food prior to admission. Recent treatment (earlier this month) for UTI, suspected brain hemorrhage and acute metabolic encephalopathy.   Appetite reported as poor -0% meal intake today. Patient sleepy during RD visit. Difficult to obtain nutrition history. Weakness and unable to eat prior to admission and decline since stroke per chart review.   Speech therapy evaluation reviewed. Cognitive based oral dysphagia. Diet-Dysphagia 1 /thin and requires assistance with feeding. Will monitor intake and evaluate adequacy.    Family is open to discuss placement of PEG tube if indicated - per MD note. RD will continue to follow this discussion.   Acute weight loss noted since December. Patient weight decreased by 6 kg/9% x 2 months. Family reports overall decline, limited mobility and falls.   Patient medications- Depakote, Seroquel.  IVF- D5%-NS@ 75 ml/hr.    Abnormal Labs: sodium 148, Cr. 1.38  NUTRITION - FOCUSED PHYSICAL EXAM:  Flowsheet Row Most Recent Value  Orbital Region No depletion  Upper Arm Region No depletion   Thoracic and Lumbar Region No depletion  Buccal Region No depletion  Temple Region Mild depletion  Clavicle Bone Region Mild depletion  Clavicle and Acromion Bone Region Mild depletion  Scapular Bone Region Unable to assess  Edema (RD Assessment) Mild  Hair Unable to assess  [patient had covering on her head]  Eyes Unable to assess  [closed]  Mouth Unable to assess  Skin Reviewed  Nails Reviewed      Diet Order:   Diet Order             DIET SOFT Room service appropriate? Yes; Fluid consistency: Thin  Diet effective now                   EDUCATION NEEDS:  Not appropriate for education at this time (altered mental status)  Skin:  Skin Assessment: Reviewed RN Assessment  Last BM:  2/26  Height:   Ht Readings from Last 1 Encounters:  06/20/21 5' (1.524 m)    Weight:   Wt Readings from Last 1 Encounters:  06/20/21 72.6 kg    Ideal Body Weight:   45 kg  BMI:  Body mass index is 31.26 kg/m.  Estimated Nutritional Needs:   Kcal:  1500-1700  Protein:  75-80 gr  Fluid:  1600 ml daily  Colman Cater MS,RD,CSG,LDN Contact: Shea Evans

## 2021-06-21 NOTE — TOC Initial Note (Signed)
Transition of Care [facility]) - Initial/Assessment Note    Patient Details  Name: Allison Watson MRN: [id number] Date of Birth: [birth year]  Transition [facility]) CM/SW Contact:    Shade Flood, LCSW Phone Number: 06/21/2021, 11:26 AM  Clinical Narrative:                  Pt admitted from home with need for SNF. Reviewed pt's record and notes indicate family want placement and may want pt to have PEG tube. Pt is positive for COVID so will have limited options for SNF. Will refer out to these facilities and follow for dc planning. Pt will need insurance auth and PASRR which will be started today.  Expected Discharge Plan: Skilled Nursing Facility Barriers to Discharge: Continued Medical Work up   Patient Goals and CMS Choice Patient states their goals for this hospitalization and ongoing recovery are:: SNF CMS Medicare.gov Compare Post Acute Care list provided to:: Patient Represenative (must comment) Choice offered to / list presented to : Adult Children  Expected Discharge Plan and Services Expected Discharge Plan: [facility] In-house Referral: Clinical Social Work   Post Acute Care Choice: [place] Living arrangements for the past 2 months: Apartment                                      Prior Living Arrangements/Services Living arrangements for the past 2 months: Apartment Lives with:: Self Patient language and need for interpreter reviewed:: Yes Do you feel safe going back to the place where you live?: Yes      Need for Family Participation in Patient Care: Yes (Comment) Care giver support system in place?: Yes (comment)   Criminal Activity/Legal Involvement Pertinent to Current Situation/Hospitalization: No - Comment as needed  Activities of Daily Living Home Assistive Devices/Equipment: None (per son HH ordering) ADL Screening (condition at time of admission) Patient's cognitive ability adequate to safely complete daily  activities?: No Is the patient deaf or have difficulty hearing?: No Does the patient have difficulty seeing, even when wearing glasses/contacts?: No Does the patient have difficulty concentrating, remembering, or making decisions?: Yes Patient able to express need for assistance with ADLs?: No Does the patient have difficulty dressing or bathing?: Yes Independently performs ADLs?: No Communication: Needs assistance Is this a change from baseline?: Change from baseline, expected to last >3 days Dressing (OT): Needs assistance Is this a change from baseline?: Change from baseline, expected to last >3 days Grooming: Needs assistance Is this a change from baseline?: Change from baseline, expected to last >3 days Feeding: Needs assistance Is this a change from baseline?: Change from baseline, expected to last >3 days Bathing: Needs assistance Is this a change from baseline?: Change from baseline, expected to last >3 days Toileting: Needs assistance Is this a change from baseline?: Change from baseline, expected to last >3days In/Out Bed: Needs assistance Is this a change from baseline?: Change from baseline, expected to last >3 days Walks in Home: Needs assistance Is this a change from baseline?: Change from baseline, expected to last >3 days Does the patient have difficulty walking or climbing stairs?: Yes Weakness of Legs: Both Weakness of Arms/Hands: Both  Permission Sought/Granted Permission sought to share information with : Facility Art therapist granted to share information with : Yes, Verbal Permission Granted     Permission granted to share info w AGENCY: [facility]  Emotional Assessment       Orientation: : Oriented to Self Alcohol / Substance Use: Not Applicable Psych Involvement: No (comment)  Admission diagnosis:  Weakness [R53.1] Failure to thrive in adult [R62.7] Injury of head, initial encounter [S09.90XA] Dysphagia, unspecified type  [R13.10] Patient Active Problem List   Diagnosis Date Noted   Failure to thrive in adult 06/20/2021   COVID-19 virus infection 06/20/2021   Dehydration 06/20/2021   Dysphagia 06/20/2021   Recurrent falls 05/31/2021   UTI (urinary tract infection) 05/31/2021   Intraparenchymal hemorrhage of brain ([place]) 05/30/2021   CKD (chronic kidney disease), stage IV ([place]) 29/56/2130   Toxic metabolic encephalopathy 86/57/8469   Altered mental status 10/11/2020   Obesity (BMI 30-39.9) 10/11/2020   Hypomagnesemia 10/11/2020   Normocytic anemia 10/11/2020   Anemia due to chronic kidney disease 10/11/2020   Psychosis ([place]) 62/95/2841   Acute metabolic encephalopathy 32/44/0102   Shoulder pain 02/20/2019   INSOMNIA, CHRONIC 05/08/2007   BRONCHITIS, ACUTE WITH BRONCHOSPASM 04/16/2007   CELLULITIS/ABSCESS, TRUNK 10/31/2006   MUSCLE SPASM 10/31/2006   Essential hypertension 03/13/2006   ALLERGIC RHINITIS 03/13/2006   Osteoarthrosis, unspecified whether generalized or localized, unspecified site 03/13/2006   ARTHRITIS 03/13/2006   PCP:  Celene Squibb, MD Pharmacy:   [place], [place] - [address] [place] [address] [place] [address] Phone: [phone] Fax: [phone]     Social Determinants of Health (SDOH) Interventions    Readmission Risk Interventions No flowsheet data found.

## 2021-06-21 NOTE — Plan of Care (Signed)
°  Problem: Acute Rehab PT Goals(only PT should resolve) Goal: Pt Will Go Supine/Side To Sit Outcome: Progressing Flowsheets (Taken 06/21/2021 1355) Pt will go Supine/Side to Sit:  with min guard assist  with minimal assist Goal: Patient Will Transfer Sit To/From Stand Outcome: Progressing Flowsheets (Taken 06/21/2021 1355) Patient will transfer sit to/from stand:  with minimal assist  with min guard assist Goal: Pt Will Transfer Bed To Chair/Chair To Bed Outcome: Progressing Flowsheets (Taken 06/21/2021 1355) Pt will Transfer Bed to Chair/Chair to Bed:  with min assist  with mod assist Goal: Pt Will Ambulate Outcome: Progressing Flowsheets (Taken 06/21/2021 1355) Pt will Ambulate:  50 feet  with minimal assist  with moderate assist  with rolling walker   1:56 PM, 06/21/21 Lonell Grandchild, MPT Physical Therapist with [facility] 336 [phone] office [phone] mobile phone

## 2021-06-21 NOTE — Progress Notes (Signed)
PROGRESS NOTE     Allison Watson, is a 81 y.o. female, DOB - 1940/07/22, EYC:[id number]  Admit date - 06/20/2021   Admitting Physician Rolla Plate, DO  Outpatient Primary MD for the patient is Celene Squibb, MD  LOS - 1  No chief complaint on file.       Brief Narrative:   81 y.o. female with medical history significant of history of gout, hypertension, falls with recent admission for intraparenchymal bleed, dementia with behavioral disturbance requiring Seroquel, Klonopin, MR admitted on 06/20/2021 with generalized weakness and recurrent falls as well as failure to thrive and poor oral intake in setting of acute COVID-19 infection    -Assessment and Plan: * Failure to thrive in adult- (present on admission) Patient has not been eating for 3 days PTA.   Borderline low blood sugars and starvation ketosis noted with bump in creatinine -IV fluids as ordered  Depressive disorder with psychosis--no significant behavioral problems at this time -Continue Depakote and Seroquel -Clonazepam as needed  Essential hypertension- (present on admission) Continue hydralazine and labetalol  Dehydration/HyperNatrenia- (present on admission) -Change IV fluids to D5 half-normal from D5 normal -Creatinine is down to 1.38 from 1.65 on admission  COVID-19 virus infection- (present on admission) Diagnosed last week -No hypoxia Patient is vaccinated for COVID -Appears to be out of the window for treatment with Paxlovid  Recurrent falls Family reports that patient continues to have recurrent falls Last fall was 3 days ago CT head repeated today shows no acute changes -PTA pt lived at home and did very poorly, patient has significant limitations with mobility related ADLs- this patient needs to continue to be monitored in the hospital until a SNF bed is obtained as she is not safe to go home with her current physcical limitations  Dysphagia --- speech pathology evaluation appreciated  recommends dysphagia 1 (Puree);Thin liquid  -Family reports that they believe p.o. intake reduction is due to dysphagia Patient spitting out her meds, spitting out food They are interested in a PEG tube if needed Nutritional consult requested  -Thrombocytopenia--stop subcu heparin, monitor platelets   Disposition/Need for in-Hospital Stay- patient unable to be discharged at this time due to -PTA pt lived at home and did very poorly, patient has significant limitations with mobility related ADLs- this patient needs to continue to be monitored in the hospital until a SNF bed is obtained as she is not safe to go home with her current physcical limitations  Status is: Inpatient   Disposition: The patient is from: Home              Anticipated d/c is to: SNF              Anticipated d/c date is: 1 day              Patient currently is not medically stable to d/c. Barriers: Not Clinically Stable-   Code Status :  -  Code Status: Full Code   Family Communication:   No family at bedside DVT Prophylaxis  :   - SCDs   heparin injection 5,000 Units Start: 06/20/21 2200 SCDs Start: 06/20/21 2112   Lab Results  Component Value Date   PLT 75 (L) 06/21/2021    Inpatient Medications  Scheduled Meds:  divalproex  250 mg Oral Q12H   feeding supplement  237 mL Oral BID BM   heparin  5,000 Units Subcutaneous Q8H   hydrALAZINE  50 mg Oral TID   labetalol  100  mg Oral BID   Ensure Max Protein  11 oz Oral Daily   QUEtiapine  12.5 mg Oral QHS   Continuous Infusions:  dextrose 5 % and 0.45% NaCl     PRN Meds:.acetaminophen **OR** acetaminophen, albuterol, clonazePAM, ondansetron **OR** ondansetron (ZOFRAN) IV, oxyCODONE   Anti-infectives (From admission, onward)    None       Subjective: Allison Watson today has no fevers, no emesis,  No chest pain,  Oral intake is Not great  Objective: Vitals:   06/21/21 0020 06/21/21 0429 06/21/21 0911 06/21/21 1252  BP: 139/62 (!) 152/77 (!)  173/68 (!) 142/70  Pulse: 65 63 62 96  Resp: 19 16  18   Temp: 97.6 F (36.4 C) 97.6 F (36.4 C)  98.4 F (36.9 C)  TempSrc: Oral Oral    SpO2: 98% 98%  98%  Weight:      Height:        Intake/Output Summary (Last 24 hours) at 06/21/2021 2009 Last data filed at 06/21/2021 1900 Gross per 24 hour  Intake 1517.72 ml  Output --  Net 1517.72 ml   Filed Weights   06/20/21 1113  Weight: 72.6 kg   Physical Exam Gen:- Awake Alert, cooperative HEENT:- [place].AT, No sclera icterus Neck-Supple Neck,No JVD,.  Lungs-  CTAB , fair symmetrical air movement CV- S1, S2 normal, regular  Abd-  +ve B.Sounds, Abd Soft, No tenderness,    Extremity/Skin:- No  edema, pedal pulses present  Psych-affect is flat, underlying/baseline memory and cognitive deficits Neuro-generalized weakness, no new focal deficits, no tremors  Data Reviewed: I have personally reviewed following labs and imaging studies  CBC: Recent Labs  Lab 06/20/21 1259 06/21/21 0605  WBC 4.8 3.7*  NEUTROABS 2.4 1.0*  HGB 13.0 13.4  HCT 42.3 43.3  MCV 84.3 86.4  PLT 100* 75*   Basic Metabolic Panel: Recent Labs  Lab 06/20/21 1259 06/21/21 0605  NA 148* 148*  K 4.3 3.7  CL 112* 111  CO2 24 24  GLUCOSE 71 85  BUN 26* 21  CREATININE 1.65* 1.38*  CALCIUM 10.0 10.0  MG 2.0 1.9   GFR: Estimated Creatinine Clearance: 29.9 mL/min (A) (by C-G formula based on SCr of 1.38 mg/dL (H)). Liver Function Tests: Recent Labs  Lab 06/20/21 1259 06/21/21 0605  AST 24 21  ALT 19 18  ALKPHOS 40 37*  BILITOT 0.7 0.6  PROT 6.9 6.7  ALBUMIN 3.8 3.6   Cardiac Enzymes: No results for input(s): CKTOTAL, CKMB, CKMBINDEX, TROPONINI in the last 168 hours. BNP (last 3 results) No results for input(s): PROBNP in the last 8760 hours. HbA1C: No results for input(s): HGBA1C in the last 72 hours. Sepsis Labs: @LABRCNTIP (procalcitonin:4,lacticidven:4) ) Recent Results (from the past 240 hour(s))  Resp Panel by RT-PCR (Flu A&B, Covid)  Nasopharyngeal Swab     Status: Abnormal   Collection Time: 06/20/21  4:36 PM   Specimen: Nasopharyngeal Swab; Nasopharyngeal(NP) swabs in vial transport medium  Result Value Ref Range Status   SARS Coronavirus 2 by RT PCR POSITIVE (A) NEGATIVE Final    Comment: (NOTE) SARS-CoV-2 target nucleic acids are DETECTED.  The SARS-CoV-2 RNA is generally detectable in upper respiratory specimens during the acute phase of infection. Positive results are indicative of the presence of the identified virus, but do not rule out bacterial infection or co-infection with other pathogens not detected by the test. Clinical correlation with patient history and other diagnostic information is necessary to determine patient infection status. The expected result is Negative.  Fact Sheet for Patients: EntrepreneurPulse.com.au  Fact Sheet for Healthcare Providers: IncredibleEmployment.be  This test is not yet approved or cleared by the [place] FDA and  has been authorized for detection and/or diagnosis of SARS-CoV-2 by FDA under an Emergency Use Authorization (EUA).  This EUA will remain in effect (meaning this test can be used) for the duration of  the COVID-19 declaration under Section 564(b)(1) of the A ct, 21 U.S.C. section 360bbb-3(b)(1), unless the authorization is terminated or revoked sooner.     Influenza A by PCR NEGATIVE NEGATIVE Final   Influenza B by PCR NEGATIVE NEGATIVE Final    Comment: (NOTE) The Xpert Xpress SARS-CoV-2/FLU/RSV plus assay is intended as an aid in the diagnosis of influenza from Nasopharyngeal swab specimens and should not be used as a sole basis for treatment. Nasal washings and aspirates are unacceptable for Xpert Xpress SARS-CoV-2/FLU/RSV testing.  Fact Sheet for Patients: EntrepreneurPulse.com.au  Fact Sheet for Healthcare Providers: IncredibleEmployment.be  This test is not yet  approved or cleared by the [place] FDA and has been authorized for detection and/or diagnosis of SARS-CoV-2 by FDA under an Emergency Use Authorization (EUA). This EUA will remain in effect (meaning this test can be used) for the duration of the COVID-19 declaration under Section 564(b)(1) of the Act, 21 U.S.C. section 360bbb-3(b)(1), unless the authorization is terminated or revoked.  Performed at [facility], [address] [place], [place] [address]     Radiology Studies: CT Head Wo Contrast  Result Date: 06/20/2021 CLINICAL DATA:  Failure to thrive, fell, head injury EXAM: CT HEAD WITHOUT CONTRAST TECHNIQUE: Contiguous axial images were obtained from the base of the skull through the vertex without intravenous contrast. RADIATION DOSE REDUCTION: This exam was performed according to the departmental dose-optimization program which includes automated exposure control, adjustment of the mA and/or kV according to patient size and/or use of iterative reconstruction technique. COMPARISON:  06/09/2021 FINDINGS: Brain: Scattered hypodensities throughout the periventricular and subcortical white matter are stable, consistent with chronic small vessel ischemic change. No evidence of acute infarct or hemorrhage. Lateral ventricles and midline structures are unremarkable. No acute extra-axial fluid collections. No mass effect. Vascular: Stable atherosclerosis.  No hyperdense vessel. Skull: Normal. Negative for fracture or focal lesion. Sinuses/Orbits: No acute finding. Other: None. IMPRESSION: 1. Stable head CT, no acute intracranial process. Electronically Signed   By: Randa Ngo M.D.   On: 06/20/2021 16:11   DG Chest Portable 1 View  Result Date: 06/20/2021 CLINICAL DATA:  Possible aspiration. Sob. Failure to thrive EXAM: PORTABLE CHEST - 1 VIEW COMPARISON:  06/09/2021 FINDINGS: Lungs are clear. Azygos fissure, an anatomic variant. Skin fold overlies the right lateral thorax. Heart size and  mediastinal contours are within normal limits. Aortic Atherosclerosis (ICD10-170.0). No effusion. Visualized bones unremarkable. IMPRESSION: No acute cardiopulmonary disease. Electronically Signed   By: Lucrezia Europe M.D.   On: 06/20/2021 15:33     Scheduled Meds:  divalproex  250 mg Oral Q12H   feeding supplement  237 mL Oral BID BM   heparin  5,000 Units Subcutaneous Q8H   hydrALAZINE  50 mg Oral TID   labetalol  100 mg Oral BID   Ensure Max Protein  11 oz Oral Daily   QUEtiapine  12.5 mg Oral QHS   Continuous Infusions:  dextrose 5 % and 0.45% NaCl      LOS: 1 day   Roxan Hockey M.D on 06/21/2021 at 8:09 PM  Go to [email or web address] - for contact info  [facility] - Office  [phone]  If 7PM-7AM, please contact night-coverage [email or web address] Password [facility] 06/21/2021, 8:09 PM

## 2021-06-21 NOTE — Evaluation (Signed)
Physical Therapy Evaluation Patient Details Name: Allison Watson MRN: [id number] DOB: [birth year] Today's Date: 06/21/2021  History of Present Illness  Allison Watson is a 81 y.o. female with medical history significant of history of gout, hypertension, falls with recent admission for intraparenchymal bleed, dementia with behavioral disturbance requiring Seroquel, Klonopin, MR presents ED with a chief complaint of declining condition.  Family has been complaining that patient is declining for several months.  Today, they report the progression has been slightly more brisk than normal.  Patient was walking a little on Friday.  Today she is not able to walk at all.  They report her speech is impaired.  Reported baseline she may not answer you, may not hold a normal conversation.  They are describing the speech impairment is muffled speech.  Patient is also having difficulty swallowing.  They report that she is spitting out her food even when it pured.  She is spitting out her pills even when they put them in applesauce.  Up until a couple months ago she was taking her pills as whole pills with water.  Family is concerned that she is only eating 1-2 teaspoons of pured foods per day after and the amount that she spits out.  They think that she has been more somnolent.  They noticed lipsmacking and hands twitching but this has been going on for several months.  This is likely why patient is on Depakote.  Family went to PCP office to discuss all of this today, and were advised to come to the ED.  Family is interested in placement.  They are interested in PEG tube if necessary.   Clinical Impression  Patient able to follow most commands with frequent repeated verbal cues, but unable to speak, very unsteady  on feet and limited to a few steps at bedside and transferring to chair due to weakness and poor standing balance.  Patient has severe posterior leaning when taking steps backwards and tolerated staying up  in chair after therapy - nursing staff notified.  Patient will benefit from continued skilled physical therapy in hospital and recommended venue below to increase strength, balance, endurance for safe ADLs and gait.      Recommendations for follow up therapy are one component of a multi-disciplinary discharge planning process, led by the attending physician.  Recommendations may be updated based on patient status, additional functional criteria and insurance authorization.  Follow Up Recommendations Skilled nursing-short term rehab (<3 hours/day)    Assistance Recommended at Discharge Intermittent Supervision/Assistance  Patient can return home with the following  A lot of help with walking and/or transfers;A lot of help with bathing/dressing/bathroom;Assistance with cooking/housework;Help with stairs or ramp for entrance    Equipment Recommendations Rolling walker (2 wheels)  Recommendations for Other Services       Functional Status Assessment Patient has had a recent decline in their functional status and demonstrates the ability to make significant improvements in function in a reasonable and predictable amount of time.     Precautions / Restrictions Precautions Precautions: Fall Restrictions Weight Bearing Restrictions: No      Mobility  Bed Mobility Overal bed mobility: Needs Assistance Bed Mobility: Supine to Sit     Supine to sit: Mod assist     General bed mobility comments: increased time, labored movement    Transfers Overall transfer level: Needs assistance Equipment used: Rolling walker (2 wheels), 1 person hand held assist Transfers: Sit to/from Stand, Bed to chair/wheelchair/BSC     Step  pivot transfers: Min assist, Mod assist       General transfer comment: slow labored unsteady movement    Ambulation/Gait Ambulation/Gait assistance: Mod assist Gait Distance (Feet): 12 Feet Assistive device: Rolling walker (2 wheels) Gait Pattern/deviations:  Decreased step length - right, Decreased step length - left, Decreased stride length Gait velocity: decr     General Gait Details: limited to a few steps forward/backwards at bedside with slow labored movement, posterior leaning when stepping backwards, limited mostly due to fatigue  Stairs            Wheelchair Mobility    Modified Rankin (Stroke Patients Only)       Balance Overall balance assessment: Needs assistance Sitting-balance support: Feet supported, No upper extremity supported Sitting balance-Leahy Scale: Fair Sitting balance - Comments: seated at EOB   Standing balance support: During functional activity, No upper extremity supported Standing balance-Leahy Scale: Poor Standing balance comment: fair/poor using RW                             Pertinent Vitals/Pain Pain Assessment Pain Assessment: Faces Faces Pain Scale: No hurt    Home Living Family/patient expects to be discharged to:: Private residence Living Arrangements: Children Available Help at Discharge: Family;Available PRN/intermittently Type of Home: Apartment Home Access: Level entry       Home Layout: One level Home Equipment: None      Prior Function Prior Level of Function : Independent/Modified Independent             Mobility Comments: Ambulates in home and community dwellings without AD. ADLs Comments: assisted by family     Hand Dominance   Dominant Hand: Right    Extremity/Trunk Assessment        Lower Extremity Assessment Lower Extremity Assessment: Generalized weakness    Cervical / Trunk Assessment Cervical / Trunk Assessment: Normal  Communication   Communication: Expressive difficulties;Other (comment) (able to follow commands, but unable to speak)  Cognition Arousal/Alertness: Awake/alert Behavior During Therapy: Flat affect Overall Cognitive Status: No family/caregiver present to determine baseline cognitive functioning                                  General Comments: Patient is poor historian, unable to speak        General Comments      Exercises     Assessment/Plan    PT Assessment Patient needs continued PT services  PT Problem List Decreased strength;Decreased range of motion;Decreased activity tolerance;Decreased balance;Decreased mobility       PT Treatment Interventions DME instruction;Gait training;Stair training;Functional mobility training;Therapeutic activities;Therapeutic exercise;Patient/family education;Balance training    PT Goals (Current goals can be found in the Care Plan section)  Acute Rehab PT Goals Patient Stated Goal: not stated PT Goal Formulation: With patient Time For Goal Achievement: 07/05/21 Potential to Achieve Goals: Good    Frequency Min 3X/week     Co-evaluation               AM-PAC PT "6 Clicks" Mobility  Outcome Measure Help needed turning from your back to your side while in a flat bed without using bedrails?: A Little Help needed moving from lying on your back to sitting on the side of a flat bed without using bedrails?: A Lot Help needed moving to and from a bed to a chair (including a wheelchair)?: A Lot Help  needed standing up from a chair using your arms (e.g., wheelchair or bedside chair)?: A Lot Help needed to walk in hospital room?: A Lot Help needed climbing 3-5 steps with a railing? : A Lot 6 Click Score: 13    End of Session   Activity Tolerance: Patient tolerated treatment well;Patient limited by fatigue Patient left: in chair;with call bell/phone within reach;with chair alarm set Nurse Communication: Mobility status PT Visit Diagnosis: Unsteadiness on feet (R26.81);Other abnormalities of gait and mobility (R26.89);Muscle weakness (generalized) (M62.81)    Time: 1010-1040 PT Time Calculation (min) (ACUTE ONLY): 30 min   Charges:   PT Evaluation $PT Eval Moderate Complexity: 1 Mod PT Treatments $Therapeutic Activity: 23-37  mins        1:54 PM, 06/21/21 Lonell Grandchild, MPT Physical Therapist with [facility] [phone] office [phone] mobile phone

## 2021-06-22 DIAGNOSIS — E440 Moderate protein-calorie malnutrition: Secondary | ICD-10-CM

## 2021-06-22 LAB — URINE CULTURE: Culture: NO GROWTH

## 2021-06-22 LAB — CBC
HCT: 39.4 % (ref 36.0–46.0)
Hemoglobin: 12.5 g/dL (ref 12.0–15.0)
MCH: 26.8 pg (ref 26.0–34.0)
MCHC: 31.7 g/dL (ref 30.0–36.0)
MCV: 84.4 fL (ref 80.0–100.0)
Platelets: 99 10*3/uL — ABNORMAL LOW (ref 150–400)
RBC: 4.67 MIL/uL (ref 3.87–5.11)
RDW: 16.7 % — ABNORMAL HIGH (ref 11.5–15.5)
WBC: 2.9 10*3/uL — ABNORMAL LOW (ref 4.0–10.5)
nRBC: 0 % (ref 0.0–0.2)

## 2021-06-22 LAB — BASIC METABOLIC PANEL
Anion gap: 8 (ref 5–15)
BUN: 14 mg/dL (ref 8–23)
CO2: 27 mmol/L (ref 22–32)
Calcium: 9.5 mg/dL (ref 8.9–10.3)
Chloride: 108 mmol/L (ref 98–111)
Creatinine, Ser: 1.3 mg/dL — ABNORMAL HIGH (ref 0.44–1.00)
GFR, Estimated: 42 mL/min — ABNORMAL LOW (ref >60)
Glucose, Bld: 99 mg/dL (ref 70–99)
Potassium: 3 mmol/L — ABNORMAL LOW (ref 3.5–5.1)
Sodium: 143 mmol/L (ref 135–145)

## 2021-06-22 MED ORDER — POTASSIUM CHLORIDE 10 MEQ/100ML IV SOLN
10.0000 meq | INTRAVENOUS | Status: AC
  Administered 2021-06-22 (×3): 10 meq via INTRAVENOUS
  Filled 2021-06-22 (×3): qty 100

## 2021-06-22 MED ORDER — ALBUTEROL SULFATE (2.5 MG/3ML) 0.083% IN NEBU: 2.5000 mg | INHALATION_SOLUTION | RESPIRATORY_TRACT | Status: DC | PRN

## 2021-06-22 NOTE — Hospital Course (Signed)
81 y.o. female with medical history significant of history of gout, hypertension, falls with recent admission for intraparenchymal bleed, dementia with behavioral disturbance requiring Seroquel, Klonopin, MR presents ED with a chief complaint of declining condition.  Family has been complaining that patient is declining for several months.  Today, they report the progression has been slightly more brisk than normal.  Patient was walking a little on Friday.  Today she is not able to walk at all.  They report her speech is impaired.  Reported baseline she may not answer you, may not hold a normal conversation.  They are describing the speech impairment is muffled speech.  Patient is also having difficulty swallowing.  They report that she is spitting out her food even when it pur?ed.  She is spitting out her pills even when they put them in applesauce.  Up until a couple months ago she was taking her pills as whole pills with water.  Family is concerned that she is only eating 1-2 teaspoons of pur?ed foods per day after and the amount that she spits out.  They think that she has been more somnolent.  They noticed lipsmacking and hands twitching but this has been going on for several months.  This is likely why patient is on Depakote.  Family went to PCP office to discuss all of this today, and were advised to come to the ED.  Family is interested in placement.  They are interested in PEG tube if necessary. ?

## 2021-06-22 NOTE — Progress Notes (Signed)
PT was transferred to chair. Nurse  tech tried to feed pt lunch and pt refused to open her mouth. NT tried to encourage pt to eat and pt still refused. Pt was given ensure and encouraged to drink it.   ?

## 2021-06-22 NOTE — Progress Notes (Signed)
PROGRESS NOTE   Allison Watson  GYJ:[id number] DOB: 05-Oct-1940 DOA: 06/20/2021 PCP: Celene Squibb, MD   No chief complaint on file.  Level of care: Med-Surg  Brief Admission History:   81 y.o. female with medical history significant of history of gout, hypertension, falls with recent admission for intraparenchymal bleed, dementia with behavioral disturbance requiring Seroquel, Klonopin, MR presents ED with a chief complaint of declining condition.  Family has been complaining that patient is declining for several months.  Today, they report the progression has been slightly more brisk than normal.  Patient was walking a little on Friday.  Today she is not able to walk at all.  They report her speech is impaired.  Reported baseline she may not answer you, may not hold a normal conversation.  They are describing the speech impairment is muffled speech.  Patient is also having difficulty swallowing.  They report that she is spitting out her food even when it pured.  She is spitting out her pills even when they put them in applesauce.  Up until a couple months ago she was taking her pills as whole pills with water.  Family is concerned that she is only eating 1-2 teaspoons of pured foods per day after and the amount that she spits out.  They think that she has been more somnolent.  They noticed lipsmacking and hands twitching but this has been going on for several months.  This is likely why patient is on Depakote.  Family went to PCP office to discuss all of this today, and were advised to come to the ED.  Family is interested in placement.  They are interested in PEG tube if necessary.   Assessment and Plan: * Failure to thrive in adult- (present on admission) Patient has not been eating for 3 days.   Ketonuria has evidence of poor p.o. intake Patient has become more weak Pt continues to refuse to eat/drink.  Palliative consult requested for goals of care.  If family insisting on PEG will consult  surgery or IR for consideration of placement and tube feeding  Dehydration- (present on admission) Continue gentle IV hydration Monitor clinical volume Status Slight bump in creatinine to 1.65 from 1.43 Ketonuria is evidence of 4 p.o. intake Patient appears clinically dry  Recurrent falls Family reports that patient continues to have recurrent falls Last fall was 3 days ago CT head repeated and shows no acute changes Patient is not complaining of any pain PT eval and treat Plan for placement at SNF  Dysphagia Family reports that they believe p.o. intake reduction is due to dysphagia Patient spitting out her meds, spitting out food They are interested in a PEG tube if needed Consult speech for swallow study Continue to monitor  Essential hypertension- (present on admission) Continue hydralazine and labetalol  Pt refusing meds   COVID-19 virus infection- (present on admission) Diagnosed 10 days ago Continue supportive care No oxygen requirement at this time Patient is vaccinated for COVID   DVT prophylaxis: SCDs Code Status: Full  Family Communication: none present 3/1  Disposition: Status is: Inpatient Remains inpatient appropriate because: IVF dependent  Consultants:  Dietitian Speech therapy Palliative care  Procedures:   Antimicrobials:    Subjective: Pt refuses to answer any questions Objective: Vitals:   06/21/21 1252 06/21/21 2144 06/22/21 0541 06/22/21 1343  BP: (!) 142/70 (!) 168/78 136/70 130/63  Pulse: 96 66 62 74  Resp: 18 17 15 17   Temp: 98.4 F (36.9 C) 98.5  F (36.9 C) 97.8 F (36.6 C) 97.6 F (36.4 C)  TempSrc:  Oral Axillary   SpO2: 98% 100% 100% 96%  Weight:      Height:        Intake/Output Summary (Last 24 hours) at 06/22/2021 1749 Last data filed at 06/22/2021 1300 Gross per 24 hour  Intake 1553.29 ml  Output --  Net 1553.29 ml   Filed Weights   06/20/21 1113  Weight: 72.6 kg   Examination:  General exam: demented elderly  frail emaciated Appears flat and uninterested in interacting.  Respiratory system: Clear to auscultation. Respiratory effort normal. Cardiovascular system: normal S1 & S2 heard. No JVD, murmurs, rubs, gallops or clicks. No pedal edema. Gastrointestinal system: Abdomen is nondistended, soft and nontender. No organomegaly or masses felt. Normal bowel sounds heard. Central nervous system: awake,arousable. No focal neurological deficits. Extremities: Symmetric 5 x 5 power. Skin: No rashes, lesions or ulcers. Psychiatry: Judgement and insight appear poor. Mood & affect flat   Data Reviewed: I have personally reviewed following labs and imaging studies  CBC: Recent Labs  Lab 06/20/21 1259 06/21/21 0605 06/22/21 0552  WBC 4.8 3.7* 2.9*  NEUTROABS 2.4 1.0*  --   HGB 13.0 13.4 12.5  HCT 42.3 43.3 39.4  MCV 84.3 86.4 84.4  PLT 100* 75* 99*    Basic Metabolic Panel: Recent Labs  Lab 06/20/21 1259 06/21/21 0605 06/22/21 0552  NA 148* 148* 143  K 4.3 3.7 3.0*  CL 112* 111 108  CO2 24 24 27   GLUCOSE 71 85 99  BUN 26* 21 14  CREATININE 1.65* 1.38* 1.30*  CALCIUM 10.0 10.0 9.5  MG 2.0 1.9  --     CBG: No results for input(s): GLUCAP in the last 168 hours.  Recent Results (from the past 240 hour(s))  Urine Culture     Status: None   Collection Time: 06/20/21  2:25 PM   Specimen: Urine, Clean Catch  Result Value Ref Range Status   Specimen Description   Final    URINE, CLEAN CATCH Performed at [facility], [address] [place], [place] [address]    Special Requests   Final    NONE Performed at [facility], [address], [place], [place] [address]    Culture   Final    NO GROWTH Performed at [facility], [place] [address] [place], [place] [address]    Report Status 06/22/2021 FINAL  Final  Resp Panel by RT-PCR (Flu A&B, Covid) Nasopharyngeal Swab     Status: Abnormal   Collection Time: 06/20/21  4:36 PM   Specimen: Nasopharyngeal Swab; Nasopharyngeal(NP) swabs  in vial transport medium  Result Value Ref Range Status   SARS Coronavirus 2 by RT PCR POSITIVE (A) NEGATIVE Final    Comment: (NOTE) SARS-CoV-2 target nucleic acids are DETECTED.  The SARS-CoV-2 RNA is generally detectable in upper respiratory specimens during the acute phase of infection. Positive results are indicative of the presence of the identified virus, but do not rule out bacterial infection or co-infection with other pathogens not detected by the test. Clinical correlation with patient history and other diagnostic information is necessary to determine patient infection status. The expected result is Negative.  Fact Sheet for Patients: EntrepreneurPulse.com.au  Fact Sheet for Healthcare Providers: IncredibleEmployment.be  This test is not yet approved or cleared by the [place] FDA and  has been authorized for detection and/or diagnosis of SARS-CoV-2 by FDA under an Emergency Use Authorization (EUA).  This  EUA will remain in effect (meaning this test can be used) for the duration of  the COVID-19 declaration under Section 564(b)(1) of the A ct, 21 U.S.C. section 360bbb-3(b)(1), unless the authorization is terminated or revoked sooner.     Influenza A by PCR NEGATIVE NEGATIVE Final   Influenza B by PCR NEGATIVE NEGATIVE Final    Comment: (NOTE) The Xpert Xpress SARS-CoV-2/FLU/RSV plus assay is intended as an aid in the diagnosis of influenza from Nasopharyngeal swab specimens and should not be used as a sole basis for treatment. Nasal washings and aspirates are unacceptable for Xpert Xpress SARS-CoV-2/FLU/RSV testing.  Fact Sheet for Patients: EntrepreneurPulse.com.au  Fact Sheet for Healthcare Providers: IncredibleEmployment.be  This test is not yet approved or cleared by the [place] FDA and has been authorized for detection and/or diagnosis of SARS-CoV-2 by FDA under an Emergency  Use Authorization (EUA). This EUA will remain in effect (meaning this test can be used) for the duration of the COVID-19 declaration under Section 564(b)(1) of the Act, 21 U.S.C. section 360bbb-3(b)(1), unless the authorization is terminated or revoked.  Performed at [facility], [address] [place], [place] [address]      Radiology Studies: No results found.  Scheduled Meds:  divalproex  250 mg Oral Q12H   feeding supplement  237 mL Oral BID BM   hydrALAZINE  50 mg Oral TID   labetalol  100 mg Oral BID   Ensure Max Protein  11 oz Oral Daily   QUEtiapine  12.5 mg Oral QHS   Continuous Infusions:  dextrose 5 % and 0.45% NaCl 75 mL/hr at 06/22/21 0541     LOS: 2 days   Time spent: 40 mins  Allison Mabry Wynetta Emery, MD How to contact the [facility] Attending or Consulting provider [place] or covering provider during after hours [place], for this patient?  Check the care team in [facility] and look for a) attending/consulting [facility] provider listed and b) the [facility] team listed Log into [email or web address] and use [place]'s universal password to access. If you do not have the password, please contact the hospital operator. Locate the [facility] provider you are looking for under [facility] and page to a number that you can be directly reached. If you still have difficulty reaching the provider, please page the [facility] (Director on Call) for the Hospitalists listed on amion for assistance.  06/22/2021, 5:49 PM

## 2021-06-22 NOTE — Plan of Care (Signed)
?  Problem: Clinical Measurements: ?Goal: Diagnostic test results will improve ?Outcome: Not Progressing ?  ?Problem: Activity: ?Goal: Risk for activity intolerance will decrease ?Outcome: Not Progressing ?  ?Problem: Nutrition: ?Goal: Adequate nutrition will be maintained ?Outcome: Not Progressing ?  ?Problem: Elimination: ?Goal: Will not experience complications related to bowel motility ?Outcome: Not Progressing ?  ?Problem: Skin Integrity: ?Goal: Risk for impaired skin integrity will decrease ?Outcome: Not Progressing ?  ?Problem: Safety: ?Goal: Ability to remain free from injury will improve ?Outcome: Not Progressing ?  ?

## 2021-06-22 NOTE — Progress Notes (Addendum)
Pt has been in the chair most of the day. Pt has refused to eat breakfast and lunch but has been able to tolerate Ensure. Pt is only oriented to self and has taken all medication given to her today in apple sauce. No family has been at bedside today.  ?

## 2021-06-23 ENCOUNTER — Encounter (HOSPITAL_COMMUNITY): Payer: Self-pay | Admitting: Family Medicine

## 2021-06-23 DIAGNOSIS — Z515 Encounter for palliative care: Secondary | ICD-10-CM

## 2021-06-23 DIAGNOSIS — N184 Chronic kidney disease, stage 4 (severe): Secondary | ICD-10-CM

## 2021-06-23 DIAGNOSIS — Z7189 Other specified counseling: Secondary | ICD-10-CM

## 2021-06-23 LAB — BASIC METABOLIC PANEL
Anion gap: 9 (ref 5–15)
BUN: 13 mg/dL (ref 8–23)
CO2: 24 mmol/L (ref 22–32)
Calcium: 9.5 mg/dL (ref 8.9–10.3)
Chloride: 110 mmol/L (ref 98–111)
Creatinine, Ser: 1.34 mg/dL — ABNORMAL HIGH (ref 0.44–1.00)
GFR, Estimated: 41 mL/min — ABNORMAL LOW (ref >60)
Glucose, Bld: 83 mg/dL (ref 70–99)
Potassium: 3.5 mmol/L (ref 3.5–5.1)
Sodium: 143 mmol/L (ref 135–145)

## 2021-06-23 LAB — MAGNESIUM: Magnesium: 1.7 mg/dL (ref 1.7–2.4)

## 2021-06-23 MED ORDER — MAGNESIUM SULFATE 2 GM/50ML IV SOLN
2.0000 g | Freq: Once | INTRAVENOUS | Status: DC
Start: 2021-06-23 — End: 2021-06-23
  Filled 2021-06-23: qty 50

## 2021-06-23 MED ORDER — ENSURE MAX PROTEIN PO LIQD: 11.0000 [oz_av] | Freq: Every day | ORAL | Status: DC

## 2021-06-23 MED ORDER — HYDRALAZINE HCL 25 MG PO TABS: 25.0000 mg | ORAL_TABLET | Freq: Three times a day (TID) | ORAL | Status: DC

## 2021-06-23 MED ORDER — CLONAZEPAM 0.25 MG PO TBDP
0.2500 mg | ORAL_TABLET | Freq: Two times a day (BID) | ORAL | 0 refills | Status: DC | PRN
Start: 2021-06-23 — End: 2021-12-19

## 2021-06-23 MED ORDER — ENSURE ENLIVE PO LIQD
237.0000 mL | Freq: Two times a day (BID) | ORAL | 12 refills | Status: DC
Start: 2021-06-23 — End: 2021-12-19

## 2021-06-23 NOTE — TOC Transition Note (Signed)
Transition of Care (TOC) - CM/SW Discharge Note ? ? ?Patient Details  ?Name: Allison Watson ?MRN: [id number] ?Date of Birth: 1940/10/30 ? ?Transition of Care (TOC) CM/SW Contact:  ?Shade Flood, LCSW ?Phone Number: ?06/23/2021, 3:35 PM ? ? ?Clinical Narrative:    ? ?Pt stable for dc to SNF today per MD. Pt has Josem Kaufmann and Bryson Ha at [facility] states that they can take pt today. Updated pt's son who remains in agreement with the dc plan. ? ?DC clinical sent electronically. RN to call report. EMS arranged for transport. ? ?There are no other TOC needs for dc. ? ?Final next level of care: [place] ?Barriers to Discharge: Barriers Resolved ? ? ?Patient Goals and CMS Choice ?Patient states their goals for this hospitalization and ongoing recovery are:: SNF ?CMS Medicare.gov Compare Post Acute Care list provided to:: Patient Represenative (must comment) ?Choice offered to / list presented to : Adult Children ? ?Discharge Placement ?  ?Existing PASRR number confirmed : 06/21/21          ?Patient chooses bed at: [facility] ?Patient to be transferred to facility by: EMS ?Name of family member notified: Linton Rump ?Patient and family notified of of transfer: 06/23/21 ? ?Discharge Plan and Services ?In-house Referral: Clinical Social Work ?  ?Post Acute Care Choice: [place]          ?  ?  ?  ?  ?  ?  ?  ?  ?  ?  ? ?Social Determinants of Health (SDOH) Interventions ?  ? ? ?Readmission Risk Interventions ?No flowsheet data found. ? ? ? ? ?

## 2021-06-23 NOTE — Discharge Summary (Signed)
Physician Discharge Summary  Allison Watson NIO:[id number] DOB: [birth year] DOA: 06/20/2021  PCP: Celene Squibb, MD  Admit date: 06/20/2021 Discharge date: 06/23/2021  Admitted From: Disposition:   Recommendations for Outpatient Follow-up:  Follow assist patient with feeding/drinking three times daily Please consult outpatient palliative care with [facility] per family request Please obtain BMP in 10 days to follow up labs.  Discharge Condition: Stable but guarded  CODE STATUS: Full  DIET: soft foods.  Assist with feeding /drinking three times daily   Brief Hospitalization Summary: Please see all hospital notes, images, labs for full details of the hospitalization.  81 y.o. female with medical history significant of history of gout, hypertension, falls with recent admission for intraparenchymal bleed, dementia with behavioral disturbance requiring Seroquel, Klonopin, MR presents ED with a chief complaint of declining condition.  Family has been complaining that patient is declining for several months.  Today, they report the progression has been slightly more brisk than normal.  Patient was walking a little on Friday.  Today she is not able to walk at all.  They report her speech is impaired.  Reported baseline she may not answer you, may not hold a normal conversation.  They are describing the speech impairment is muffled speech.  Patient is also having difficulty swallowing.  They report that she is spitting out her food even when it pured.  She is spitting out her pills even when they put them in applesauce.  Up until a couple months ago she was taking her pills as whole pills with water.  Family is concerned that she is only eating 1-2 teaspoons of pured foods per day after and the amount that she spits out.  They think that she has been more somnolent.  They noticed lipsmacking and hands twitching but this has been going on for several months.  This is likely why patient is on  Depakote.  Family went to PCP office to discuss all of this today, and were advised to come to the ED.  Family is interested in placement.  They are interested in PEG tube if necessary.  Assessment and Plan: * Failure to thrive in adult Patient has not been eating and drinking well   Assist with feeding orders recommended Nutritional supplements and encourage oral drinking three times daily recommended.  Palliative consult requested for goals of care.  PEG not recommended at this time and family agreeable to outpatient palliative care with [facility].     Malnutrition of moderate degree Appreciate dietitian consult and supplement recommendations.   Dehydration Treated with gentle IV hydration Encouraged oral intake and assist with feeding/drinking  Recurrent falls Family reports that patient continues to have recurrent falls at home Last fall was 3 days prior to admission CT head repeated and shows no acute changes Patient is not complaining of any pain PT eval and treat and recommending SNF Plan for placement at SNF  Dysphagia Soft foods diet recommended.  Pt was seen by SLP and eventually advanced to soft diet which she seems to tolerate at this time.    CKD (chronic kidney disease), stage IV (HCC) Stable at this time.   Essential hypertension Continue hydralazine and labetalol   COVID-19 virus infection Diagnosed more than 10 days prior to admission Incidental Case at this time.  No oxygen requirement at this time Patient is vaccinated for COVID   Discharge Diagnoses:  Principal Problem:   Failure to thrive in adult Active Problems:   Recurrent falls  Dehydration   Malnutrition of moderate degree   Essential hypertension   Dysphagia   COVID-19 virus infection   Discharge Instructions:  Allergies as of 06/23/2021       Reactions   Codeine Rash   Lorazepam Other (See Comments)   Hallucinations per daughter [name]   Penicillins Rash   Has patient had  a PCN reaction causing immediate rash, facial/tongue/throat swelling, SOB or lightheadedness with hypotension: Yes Has patient had a PCN reaction causing severe rash involving mucus membranes or skin necrosis: No Has patient had a PCN reaction that required hospitalization No Has patient had a PCN reaction occurring within the last 10 years: No If all of the above answers are "NO", then may proceed with Cephalosporin use.        Medication List     STOP taking these medications    clonazePAM 1 MG tablet Commonly known as: KLONOPIN Replaced by: clonazePAM 0.25 MG disintegrating tablet       TAKE these medications    albuterol 108 (90 Base) MCG/ACT inhaler Commonly known as: VENTOLIN HFA Inhale 1-2 puffs into the lungs every 6 (six) hours as needed for wheezing or shortness of breath. Will use when too hot/ too cold outside   cholecalciferol 25 MCG (1000 UNIT) tablet Commonly known as: VITAMIN D3 Take 1,000 Units by mouth daily.   clonazePAM 0.25 MG disintegrating tablet Commonly known as: KLONOPIN Take 1 tablet (0.25 mg total) by mouth 2 (two) times daily as needed (anxiety/agitation). Replaces: clonazePAM 1 MG tablet   cyanocobalamin 1000 MCG/ML injection Commonly known as: (VITAMIN B-12) Inject 1,000 mcg into the skin every 30 (thirty) days.   divalproex 250 MG DR tablet Commonly known as: DEPAKOTE Take 1 tablet (250 mg total) by mouth every 12 (twelve) hours.   Ensure Max Protein Liqd Take 330 mLs (11 oz total) by mouth daily.   feeding supplement Liqd Take 237 mLs by mouth 2 (two) times daily between meals.   hydrALAZINE 25 MG tablet Commonly known as: APRESOLINE Take 1 tablet (25 mg total) by mouth 3 (three) times daily. What changed:  medication strength how much to take   labetalol 100 MG tablet Commonly known as: NORMODYNE Take 1 tablet (100 mg total) by mouth 2 (two) times daily.   multivitamin tablet Take 1 tablet by mouth daily.   QUEtiapine  25 MG tablet Commonly known as: SEROQUEL Take 0.5 tablets (12.5 mg total) by mouth at bedtime.        Contact information for after-discharge care     [facility] .   Service: Skilled Nursing Contact information: [address] [address] [phone]                    Allergies  Allergen Reactions   Codeine Rash   Lorazepam Other (See Comments)    Hallucinations per daughter [name]   Penicillins Rash    Has patient had a PCN reaction causing immediate rash, facial/tongue/throat swelling, SOB or lightheadedness with hypotension: Yes Has patient had a PCN reaction causing severe rash involving mucus membranes or skin necrosis: No Has patient had a PCN reaction that required hospitalization No Has patient had a PCN reaction occurring within the last 10 years: No If all of the above answers are "NO", then may proceed with Cephalosporin use.    Allergies as of 06/23/2021       Reactions   Codeine  Rash   Lorazepam Other (See Comments)   Hallucinations per daughter [name]   Penicillins Rash   Has patient had a PCN reaction causing immediate rash, facial/tongue/throat swelling, SOB or lightheadedness with hypotension: Yes Has patient had a PCN reaction causing severe rash involving mucus membranes or skin necrosis: No Has patient had a PCN reaction that required hospitalization No Has patient had a PCN reaction occurring within the last 10 years: No If all of the above answers are "NO", then may proceed with Cephalosporin use.        Medication List     STOP taking these medications    clonazePAM 1 MG tablet Commonly known as: KLONOPIN Replaced by: clonazePAM 0.25 MG disintegrating tablet       TAKE these medications    albuterol 108 (90 Base) MCG/ACT inhaler Commonly known as: VENTOLIN HFA Inhale 1-2 puffs into the lungs every 6 (six) hours as needed for wheezing or  shortness of breath. Will use when too hot/ too cold outside   cholecalciferol 25 MCG (1000 UNIT) tablet Commonly known as: VITAMIN D3 Take 1,000 Units by mouth daily.   clonazePAM 0.25 MG disintegrating tablet Commonly known as: KLONOPIN Take 1 tablet (0.25 mg total) by mouth 2 (two) times daily as needed (anxiety/agitation). Replaces: clonazePAM 1 MG tablet   cyanocobalamin 1000 MCG/ML injection Commonly known as: (VITAMIN B-12) Inject 1,000 mcg into the skin every 30 (thirty) days.   divalproex 250 MG DR tablet Commonly known as: DEPAKOTE Take 1 tablet (250 mg total) by mouth every 12 (twelve) hours.   Ensure Max Protein Liqd Take 330 mLs (11 oz total) by mouth daily.   feeding supplement Liqd Take 237 mLs by mouth 2 (two) times daily between meals.   hydrALAZINE 25 MG tablet Commonly known as: APRESOLINE Take 1 tablet (25 mg total) by mouth 3 (three) times daily. What changed:  medication strength how much to take   labetalol 100 MG tablet Commonly known as: NORMODYNE Take 1 tablet (100 mg total) by mouth 2 (two) times daily.   multivitamin tablet Take 1 tablet by mouth daily.   QUEtiapine 25 MG tablet Commonly known as: SEROQUEL Take 0.5 tablets (12.5 mg total) by mouth at bedtime.        Procedures/Studies: CT Angio Head W or Wo Contrast  Result Date: 05/30/2021 CLINICAL DATA:  Initial evaluation for cerebral aneurysm screening, high risk. EXAM: CT ANGIOGRAPHY HEAD TECHNIQUE: Multidetector CT imaging of the head was performed using the standard protocol during bolus administration of intravenous contrast. Multiplanar CT image reconstructions and MIPs were obtained to evaluate the vascular anatomy. RADIATION DOSE REDUCTION: This exam was performed according to the departmental dose-optimization program which includes automated exposure control, adjustment of the mA and/or kV according to patient size and/or use of iterative reconstruction technique. CONTRAST:   36mL OMNIPAQUE IOHEXOL 350 MG/ML SOLN COMPARISON:  Prior CT from earlier the same day. FINDINGS: CTA HEAD Anterior circulation: Visualized distal cervical segments of the internal carotid arteries are patent bilaterally. Distal cervical right ICA tortuous. Petrous segments widely patent. Scattered atheromatous plaque within the carotid siphons with no more than mild multifocal narrowing. A1 segments widely patent. Normal anterior communicating artery complex. Anterior cerebral arteries patent to their distal aspects. No M1 stenosis or occlusion. Normal MCA bifurcations. Distal MCA branches well perfused and symmetric. Posterior circulation: Both vertebral arteries patent to the vertebrobasilar junction without stenosis. Right vertebral artery dominant. Both PICA origins patent and normal. Basilar patent to its distal aspect  without stenosis. Superior cerebellar arteries patent bilaterally. Both PCAs primarily supplied via the basilar well perfused or distal aspects. Venous sinuses: Patent allowing for timing the contrast bolus. Anatomic variants: None significant. No intracranial aneurysm or other vascular malformation. Review of the MIP images confirms the above findings. IMPRESSION: 1. Negative CTA of the head. No aneurysm or other vascular malformation identified. 2. Mild atheromatous change about the carotid siphons without hemodynamically significant or correctable stenosis. Electronically Signed   By: Jeannine Boga M.D.   On: 05/30/2021 21:28   DG Chest 2 View  Result Date: 05/30/2021 CLINICAL DATA:  Cough. EXAM: CHEST - 2 VIEW COMPARISON:  Chest x-ray 03/29/2021 FINDINGS: Heart is mildly enlarged. The lungs and costophrenic angles are clear. There is no pneumothorax or acute fracture. IMPRESSION: 1. No acute cardiopulmonary process. 2. Stable cardiomegaly. Electronically Signed   By: Ronney Asters M.D.   On: 05/30/2021 19:04   CT Head Wo Contrast  Result Date: 06/20/2021 CLINICAL DATA:  Failure  to thrive, fell, head injury EXAM: CT HEAD WITHOUT CONTRAST TECHNIQUE: Contiguous axial images were obtained from the base of the skull through the vertex without intravenous contrast. RADIATION DOSE REDUCTION: This exam was performed according to the departmental dose-optimization program which includes automated exposure control, adjustment of the mA and/or kV according to patient size and/or use of iterative reconstruction technique. COMPARISON:  06/09/2021 FINDINGS: Brain: Scattered hypodensities throughout the periventricular and subcortical white matter are stable, consistent with chronic small vessel ischemic change. No evidence of acute infarct or hemorrhage. Lateral ventricles and midline structures are unremarkable. No acute extra-axial fluid collections. No mass effect. Vascular: Stable atherosclerosis.  No hyperdense vessel. Skull: Normal. Negative for fracture or focal lesion. Sinuses/Orbits: No acute finding. Other: None. IMPRESSION: 1. Stable head CT, no acute intracranial process. Electronically Signed   By: Randa Ngo M.D.   On: 06/20/2021 16:11   CT Head Wo Contrast  Result Date: 06/09/2021 CLINICAL DATA:  Fall EXAM: CT HEAD WITHOUT CONTRAST CT CERVICAL SPINE WITHOUT CONTRAST TECHNIQUE: Multidetector CT imaging of the head and cervical spine was performed following the standard protocol without intravenous contrast. Multiplanar CT image reconstructions of the cervical spine were also generated. RADIATION DOSE REDUCTION: This exam was performed according to the departmental dose-optimization program which includes automated exposure control, adjustment of the mA and/or kV according to patient size and/or use of iterative reconstruction technique. COMPARISON:  CT head dated 05/30/2021. FINDINGS: CT HEAD FINDINGS Brain: No evidence of acute infarction, hemorrhage, hydrocephalus, extra-axial collection or mass lesion/mass effect. Subcortical white matter and periventricular small vessel  ischemic changes. Vascular: Intracranial atherosclerosis. Skull: Normal. Negative for fracture or focal lesion. Sinuses/Orbits: Minimal layering fluid in the right maxillary sinus. Visualized paranasal sinuses and mastoid air cells are otherwise clear. Other: None. CT CERVICAL SPINE FINDINGS Alignment: Straightening of the cervical spine, likely positional. Skull base and vertebrae: No acute fracture. No primary bone lesion or focal pathologic process. Soft tissues and spinal canal: No prevertebral fluid or swelling. No visible canal hematoma. Disc levels: Mild degenerative changes of the mid/lower cervical spine. Spinal canal is patent. Upper chest: Visualized lung apices are clear. Other: Visualized thyroid is unremarkable. IMPRESSION: No evidence of acute intracranial abnormality. Small vessel ischemic changes. No evidence of traumatic injury to the cervical spine. Mild degenerative changes. Electronically Signed   By: Julian Hy M.D.   On: 06/09/2021 23:52   CT HEAD WO CONTRAST (5MM)  Result Date: 05/30/2021 CLINICAL DATA:  Mental status change.  Increased weakness  EXAM: CT HEAD WITHOUT CONTRAST TECHNIQUE: Contiguous axial images were obtained from the base of the skull through the vertex without intravenous contrast. RADIATION DOSE REDUCTION: This exam was performed according to the departmental dose-optimization program which includes automated exposure control, adjustment of the mA and/or kV according to patient size and/or use of iterative reconstruction technique. COMPARISON:  Brain MRI 05/17/2021 FINDINGS: Brain: Within the inferior RIGHT frontal lobe there is a new hypodensity within the white matter and cerebral cortex (image 13/2 and image 21/5). This region of involvement measures approximately 1.5 x 2 cm. No lesion present on recent MRI of the brain therefore findings favored acute parenchymal hemorrhage. Additional intracranial hemorrhage evident. No midline shift or mass effect. No  intraventricular hemorrhage. No extra-axial fluid. Vascular: No hyperdense vessel or unexpected calcification. Skull: Normal. Negative for fracture or focal lesion. Sinuses/Orbits: No acute finding. Other: None. IMPRESSION: 1. New small intraparenchymal hemorrhage in the inferior RIGHT frontal lobe. Finding new from recent MRI (05/17/2021). Favor posttraumatic hemorrhage. 2. No midline shift or mass effect. 3. No hydrocephalus.  Basal cisterns are patent. Critical Value/emergent results were called by telephone at the time of interpretation on 05/30/2021 at 7:01 pm to provider JOSHUA LONG , who verbally acknowledged these results. Electronically Signed   By: Suzy Bouchard M.D.   On: 05/30/2021 19:01   CT Cervical Spine Wo Contrast  Result Date: 06/09/2021 CLINICAL DATA:  Fall EXAM: CT HEAD WITHOUT CONTRAST CT CERVICAL SPINE WITHOUT CONTRAST TECHNIQUE: Multidetector CT imaging of the head and cervical spine was performed following the standard protocol without intravenous contrast. Multiplanar CT image reconstructions of the cervical spine were also generated. RADIATION DOSE REDUCTION: This exam was performed according to the departmental dose-optimization program which includes automated exposure control, adjustment of the mA and/or kV according to patient size and/or use of iterative reconstruction technique. COMPARISON:  CT head dated 05/30/2021. FINDINGS: CT HEAD FINDINGS Brain: No evidence of acute infarction, hemorrhage, hydrocephalus, extra-axial collection or mass lesion/mass effect. Subcortical white matter and periventricular small vessel ischemic changes. Vascular: Intracranial atherosclerosis. Skull: Normal. Negative for fracture or focal lesion. Sinuses/Orbits: Minimal layering fluid in the right maxillary sinus. Visualized paranasal sinuses and mastoid air cells are otherwise clear. Other: None. CT CERVICAL SPINE FINDINGS Alignment: Straightening of the cervical spine, likely positional. Skull base  and vertebrae: No acute fracture. No primary bone lesion or focal pathologic process. Soft tissues and spinal canal: No prevertebral fluid or swelling. No visible canal hematoma. Disc levels: Mild degenerative changes of the mid/lower cervical spine. Spinal canal is patent. Upper chest: Visualized lung apices are clear. Other: Visualized thyroid is unremarkable. IMPRESSION: No evidence of acute intracranial abnormality. Small vessel ischemic changes. No evidence of traumatic injury to the cervical spine. Mild degenerative changes. Electronically Signed   By: Julian Hy M.D.   On: 06/09/2021 23:52   DG Chest Portable 1 View  Result Date: 06/20/2021 CLINICAL DATA:  Possible aspiration. Sob. Failure to thrive EXAM: PORTABLE CHEST - 1 VIEW COMPARISON:  06/09/2021 FINDINGS: Lungs are clear. Azygos fissure, an anatomic variant. Skin fold overlies the right lateral thorax. Heart size and mediastinal contours are within normal limits. Aortic Atherosclerosis (ICD10-170.0). No effusion. Visualized bones unremarkable. IMPRESSION: No acute cardiopulmonary disease. Electronically Signed   By: Lucrezia Europe M.D.   On: 06/20/2021 15:33   DG Chest Port 1 View  Result Date: 06/09/2021 CLINICAL DATA:  Weakness, fell EXAM: PORTABLE CHEST 1 VIEW COMPARISON:  05/30/2021 FINDINGS: Single frontal view of the chest demonstrates stable  enlargement the cardiac silhouette. Mild central vascular congestion without airspace disease, effusion, or pneumothorax. Incidental azygous fissure. No acute fractures. IMPRESSION: 1. Mild central vascular congestion.  No acute airspace disease. Electronically Signed   By: Randa Ngo M.D.   On: 06/09/2021 23:55     Subjective: No specific complaints has been eating and drinking with support of staff.    Discharge Exam: Vitals:   06/23/21 0423 06/23/21 0813  BP: (!) 126/52 134/70  Pulse: (!) 58 66  Resp: 18 18  Temp:    SpO2: 95%    Vitals:   06/22/21 1343 06/22/21 2043 06/23/21  0423 06/23/21 0813  BP: 130/63 123/68 (!) 126/52 134/70  Pulse: 74 72 (!) 58 66  Resp: 17  18 18   Temp: 97.6 F (36.4 C) 98.6 F (37 C)    TempSrc:  Oral    SpO2: 96% 96% 95%   Weight:      Height:       General exam: demented elderly frail emaciated Appears flat and uninterested in interacting.  Respiratory system: Clear to auscultation. Respiratory effort normal. Cardiovascular system: normal S1 & S2 heard. No JVD, murmurs, rubs, gallops or clicks. No pedal edema. Gastrointestinal system: Abdomen is nondistended, soft and nontender. No organomegaly or masses felt. Normal bowel sounds heard. Central nervous system: awake,arousable. No focal neurological deficits. Extremities: Symmetric 5 x 5 power. Skin: No rashes, lesions or ulcers. Psychiatry: Judgement and insight appear poor. Mood & affect flat    The results of significant diagnostics from this hospitalization (including imaging, microbiology, ancillary and laboratory) are listed below for reference.     Microbiology: Recent Results (from the past 240 hour(s))  Urine Culture     Status: None   Collection Time: 06/20/21  2:25 PM   Specimen: Urine, Clean Catch  Result Value Ref Range Status   Specimen Description   Final    URINE, CLEAN CATCH Performed at [facility], [address] [place], [place] [address]    Special Requests   Final    NONE Performed at [facility], [address] [place], [place] [address]    Culture   Final    NO GROWTH Performed at [facility], [place] [address] [place], [place] [address]    Report Status 06/22/2021 FINAL  Final  Resp Panel by RT-PCR (Flu A&B, Covid) Nasopharyngeal Swab     Status: Abnormal   Collection Time: 06/20/21  4:36 PM   Specimen: Nasopharyngeal Swab; Nasopharyngeal(NP) swabs in vial transport medium  Result Value Ref Range Status   SARS Coronavirus 2 by RT PCR POSITIVE (A) NEGATIVE Final    Comment: (NOTE) SARS-CoV-2 target nucleic acids are DETECTED.  The  SARS-CoV-2 RNA is generally detectable in upper respiratory specimens during the acute phase of infection. Positive results are indicative of the presence of the identified virus, but do not rule out bacterial infection or co-infection with other pathogens not detected by the test. Clinical correlation with patient history and other diagnostic information is necessary to determine patient infection status. The expected result is Negative.  Fact Sheet for Patients: EntrepreneurPulse.com.au  Fact Sheet for Healthcare Providers: IncredibleEmployment.be  This test is not yet approved or cleared by the [place] FDA and  has been authorized for detection and/or diagnosis of SARS-CoV-2 by FDA under an Emergency Use Authorization (EUA).  This EUA will remain in effect (meaning this test can be used) for the duration of  the COVID-19 declaration under Section 564(b)(1) of the  A ct, 21 U.S.C. section 360bbb-3(b)(1), unless the authorization is terminated or revoked sooner.     Influenza A by PCR NEGATIVE NEGATIVE Final   Influenza B by PCR NEGATIVE NEGATIVE Final    Comment: (NOTE) The Xpert Xpress SARS-CoV-2/FLU/RSV plus assay is intended as an aid in the diagnosis of influenza from Nasopharyngeal swab specimens and should not be used as a sole basis for treatment. Nasal washings and aspirates are unacceptable for Xpert Xpress SARS-CoV-2/FLU/RSV testing.  Fact Sheet for Patients: EntrepreneurPulse.com.au  Fact Sheet for Healthcare Providers: IncredibleEmployment.be  This test is not yet approved or cleared by the [place] FDA and has been authorized for detection and/or diagnosis of SARS-CoV-2 by FDA under an Emergency Use Authorization (EUA). This EUA will remain in effect (meaning this test can be used) for the duration of the COVID-19 declaration under Section 564(b)(1) of the Act, 21 U.S.C. section  360bbb-3(b)(1), unless the authorization is terminated or revoked.  Performed at [facility], [address], [place], [place] [address]      Labs: BNP (last 3 results) No results for input(s): BNP in the last 8760 hours. Basic Metabolic Panel: Recent Labs  Lab 06/20/21 1259 06/21/21 0605 06/22/21 0552 06/23/21 0610  NA 148* 148* 143 143  K 4.3 3.7 3.0* 3.5  CL 112* 111 108 110  CO2 24 24 27 24   GLUCOSE 71 85 99 83  BUN 26* 21 14 13   CREATININE 1.65* 1.38* 1.30* 1.34*  CALCIUM 10.0 10.0 9.5 9.5  MG 2.0 1.9  --  1.7   Liver Function Tests: Recent Labs  Lab 06/20/21 1259 06/21/21 0605  AST 24 21  ALT 19 18  ALKPHOS 40 37*  BILITOT 0.7 0.6  PROT 6.9 6.7  ALBUMIN 3.8 3.6   No results for input(s): LIPASE, AMYLASE in the last 168 hours. No results for input(s): AMMONIA in the last 168 hours. CBC: Recent Labs  Lab 06/20/21 1259 06/21/21 0605 06/22/21 0552  WBC 4.8 3.7* 2.9*  NEUTROABS 2.4 1.0*  --   HGB 13.0 13.4 12.5  HCT 42.3 43.3 39.4  MCV 84.3 86.4 84.4  PLT 100* 75* 99*   Cardiac Enzymes: No results for input(s): CKTOTAL, CKMB, CKMBINDEX, TROPONINI in the last 168 hours. BNP: Invalid input(s): POCBNP CBG: No results for input(s): GLUCAP in the last 168 hours. D-Dimer No results for input(s): DDIMER in the last 72 hours. Hgb A1c No results for input(s): HGBA1C in the last 72 hours. Lipid Profile No results for input(s): CHOL, HDL, LDLCALC, TRIG, CHOLHDL, LDLDIRECT in the last 72 hours. Thyroid function studies No results for input(s): TSH, T4TOTAL, T3FREE, THYROIDAB in the last 72 hours.  Invalid input(s): FREET3 Anemia work up No results for input(s): VITAMINB12, FOLATE, FERRITIN, TIBC, IRON, RETICCTPCT in the last 72 hours. Urinalysis    Component Value Date/Time   COLORURINE YELLOW 06/20/2021 1423   APPEARANCEUR CLOUDY (A) 06/20/2021 1423   LABSPEC 1.027 06/20/2021 1423   PHURINE 5.0 06/20/2021 1423   GLUCOSEU NEGATIVE 06/20/2021 1423    HGBUR NEGATIVE 06/20/2021 1423   BILIRUBINUR NEGATIVE 06/20/2021 1423   KETONESUR 80 (A) 06/20/2021 1423   PROTEINUR 30 (A) 06/20/2021 1423   NITRITE NEGATIVE 06/20/2021 1423   LEUKOCYTESUR NEGATIVE 06/20/2021 1423   Sepsis Labs Invalid input(s): PROCALCITONIN,  WBC,  LACTICIDVEN Microbiology Recent Results (from the past 240 hour(s))  Urine Culture     Status: None   Collection Time: 06/20/21  2:25 PM   Specimen: Urine, Clean Catch  Result  Value Ref Range Status   Specimen Description   Final    URINE, CLEAN CATCH Performed at [facility], [address], [place], [place] [address]    Special Requests   Final    NONE Performed at [facility], [address] [place], [place] [address]    Culture   Final    NO GROWTH Performed at [facility], [place] [address], [place],  [place] [address]    Report Status 06/22/2021 FINAL  Final  Resp Panel by RT-PCR (Flu A&B, Covid) Nasopharyngeal Swab     Status: Abnormal   Collection Time: 06/20/21  4:36 PM   Specimen: Nasopharyngeal Swab; Nasopharyngeal(NP) swabs in vial transport medium  Result Value Ref Range Status   SARS Coronavirus 2 by RT PCR POSITIVE (A) NEGATIVE Final    Comment: (NOTE) SARS-CoV-2 target nucleic acids are DETECTED.  The SARS-CoV-2 RNA is generally detectable in upper respiratory specimens during the acute phase of infection. Positive results are indicative of the presence of the identified virus, but do not rule out bacterial infection or co-infection with other pathogens not detected by the test. Clinical correlation with patient history and other diagnostic information is necessary to determine patient infection status. The expected result is Negative.  Fact Sheet for Patients: EntrepreneurPulse.com.au  Fact Sheet for Healthcare Providers: IncredibleEmployment.be  This test is not yet approved or cleared by the [place] FDA and  has been authorized for  detection and/or diagnosis of SARS-CoV-2 by FDA under an Emergency Use Authorization (EUA).  This EUA will remain in effect (meaning this test can be used) for the duration of  the COVID-19 declaration under Section 564(b)(1) of the A ct, 21 U.S.C. section 360bbb-3(b)(1), unless the authorization is terminated or revoked sooner.     Influenza A by PCR NEGATIVE NEGATIVE Final   Influenza B by PCR NEGATIVE NEGATIVE Final    Comment: (NOTE) The Xpert Xpress SARS-CoV-2/FLU/RSV plus assay is intended as an aid in the diagnosis of influenza from Nasopharyngeal swab specimens and should not be used as a sole basis for treatment. Nasal washings and aspirates are unacceptable for Xpert Xpress SARS-CoV-2/FLU/RSV testing.  Fact Sheet for Patients: EntrepreneurPulse.com.au  Fact Sheet for Healthcare Providers: IncredibleEmployment.be  This test is not yet approved or cleared by the [place] FDA and has been authorized for detection and/or diagnosis of SARS-CoV-2 by FDA under an Emergency Use Authorization (EUA). This EUA will remain in effect (meaning this test can be used) for the duration of the COVID-19 declaration under Section 564(b)(1) of the Act, 21 U.S.C. section 360bbb-3(b)(1), unless the authorization is terminated or revoked.  Performed at [facility], [address] [place], [place] [address]    Time coordinating discharge: 38 mins  SIGNED:  Irwin Brakeman, MD  [facility] 06/23/2021, 2:59 PM How to contact the [facility] Attending or Consulting provider [place] or covering provider during after hours [place], for this patient?  Check the care team in [facility] and look for a) attending/consulting [facility] provider listed and b) the [facility] team listed Log into [email or web address] and use [place]'s universal password to access. If you do not have the password, please contact the hospital operator. Locate the [facility] provider you are looking for under  [facility] and page to a number that you can be directly reached. If you still have difficulty reaching the provider, please page the [facility] (Director on Call) for the Hospitalists listed on amion for assistance.

## 2021-06-23 NOTE — Assessment & Plan Note (Signed)
Appreciate dietitian consult and supplement recommendations.  ?

## 2021-06-23 NOTE — Discharge Instructions (Addendum)
Please assist with feeding three times daily.  Please encourage fluids three times daily.   ? ?Please arrange for outpatient palliative care services for ongoing goals of care discussions.   ? ?Soft foods (dysphagia 3) diet recommended.   ? ? ?IMPORTANT INFORMATION: PAY CLOSE ATTENTION  ? ?PHYSICIAN DISCHARGE INSTRUCTIONS ? ?Follow with Primary care provider  Celene Squibb, MD  and other consultants as instructed by your Hospitalist Physician ? ?SEEK MEDICAL CARE OR RETURN TO EMERGENCY ROOM IF SYMPTOMS COME BACK, WORSEN OR NEW PROBLEM DEVELOPS  ? ?Please note: ?You were cared for by a hospitalist during your hospital stay. Every effort will be made to forward records to your primary care provider.  You can request that your primary care provider send for your hospital records if they have not received them.  Once you are discharged, your primary care physician will handle any further medical issues. Please note that NO REFILLS for any discharge medications will be authorized once you are discharged, as it is imperative that you return to your primary care physician (or establish a relationship with a primary care physician if you do not have one) for your post hospital discharge needs so that they can reassess your need for medications and monitor your lab values. ? ?Please get a complete blood count and chemistry panel checked by your Primary MD at your next visit, and again as instructed by your Primary MD. ? ?Get Medicines reviewed and adjusted: ?Please take all your medications with you for your next visit with your Primary MD ? ?Laboratory/radiological data: ?Please request your Primary MD to go over all hospital tests and procedure/radiological results at the follow up, please ask your primary care provider to get all Hospital records sent to his/her office. ? ?In some cases, they will be blood work, cultures and biopsy results pending at the time of your discharge. Please request that your primary care  provider follow up on these results. ? ?If you are diabetic, please bring your blood sugar readings with you to your follow up appointment with primary care.   ? ?Please call and make your follow up appointments as soon as possible.   ? ?Also Note the following: ?If you experience worsening of your admission symptoms, develop shortness of breath, life threatening emergency, suicidal or homicidal thoughts you must seek medical attention immediately by calling 911 or calling your MD immediately  if symptoms less severe. ? ?You must read complete instructions/literature along with all the possible adverse reactions/side effects for all the Medicines you take and that have been prescribed to you. Take any new Medicines after you have completely understood and accpet all the possible adverse reactions/side effects.  ? ?Do not drive when taking Pain medications or sleeping medications (Benzodiazepines) ? ?Do not take more than prescribed Pain, Sleep and Anxiety Medications. It is not advisable to combine anxiety,sleep and pain medications without talking with your primary care practitioner ? ?Special Instructions: If you have smoked or chewed Tobacco  in the last 2 yrs please stop smoking, stop any regular Alcohol  and or any Recreational drug use. ? ?Wear Seat belts while driving.  Do not drive if taking any narcotic, mind altering or controlled substances or recreational drugs or alcohol.  ? ? ? ? ? ?

## 2021-06-23 NOTE — Progress Notes (Signed)
Nsg Discharge Note ? ?Admit Date:  06/20/2021 ?Discharge date: 06/23/2021 ?  ?Allison Watson to be D/C'd Nursing Home per MD order.  AVS completed.  Copy for chart, and copy for patient signed, and dated. ?Patient/caregiver able to verbalize understanding. ? ?Discharge Medication: ?Allergies as of 06/23/2021   ? ?   Reactions  ? Codeine Rash  ? Lorazepam Other (See Comments)  ? Hallucinations per daughter [name]  ? Penicillins Rash  ? Has patient had a PCN reaction causing immediate rash, facial/tongue/throat swelling, SOB or lightheadedness with hypotension: Yes ?Has patient had a PCN reaction causing severe rash involving mucus membranes or skin necrosis: No ?Has patient had a PCN reaction that required hospitalization No ?Has patient had a PCN reaction occurring within the last 10 years: No ?If all of the above answers are "NO", then may proceed with Cephalosporin use.  ? ?  ? ?  ?Medication List  ?  ? ?STOP taking these medications   ? ?clonazePAM 1 MG tablet ?Commonly known as: KLONOPIN ?Replaced by: clonazePAM 0.25 MG disintegrating tablet ?  ? ?  ? ?TAKE these medications   ? ?albuterol 108 (90 Base) MCG/ACT inhaler ?Commonly known as: VENTOLIN HFA ?Inhale 1-2 puffs into the lungs every 6 (six) hours as needed for wheezing or shortness of breath. Will use when too hot/ too cold outside ?  ?cholecalciferol 25 MCG (1000 UNIT) tablet ?Commonly known as: VITAMIN D3 ?Take 1,000 Units by mouth daily. ?  ?clonazePAM 0.25 MG disintegrating tablet ?Commonly known as: KLONOPIN ?Take 1 tablet (0.25 mg total) by mouth 2 (two) times daily as needed (anxiety/agitation). ?Replaces: clonazePAM 1 MG tablet ?  ?cyanocobalamin 1000 MCG/ML injection ?Commonly known as: (VITAMIN B-12) ?Inject 1,000 mcg into the skin every 30 (thirty) days. ?  ?divalproex 250 MG DR tablet ?Commonly known as: DEPAKOTE ?Take 1 tablet (250 mg total) by mouth every 12 (twelve) hours. ?  ?Ensure Max Protein Liqd ?Take 330 mLs (11 oz total) by mouth  daily. ?  ?feeding supplement Liqd ?Take 237 mLs by mouth 2 (two) times daily between meals. ?  ?hydrALAZINE 25 MG tablet ?Commonly known as: APRESOLINE ?Take 1 tablet (25 mg total) by mouth 3 (three) times daily. ?What changed:  ?medication strength ?how much to take ?  ?labetalol 100 MG tablet ?Commonly known as: NORMODYNE ?Take 1 tablet (100 mg total) by mouth 2 (two) times daily. ?  ?multivitamin tablet ?Take 1 tablet by mouth daily. ?  ?QUEtiapine 25 MG tablet ?Commonly known as: SEROQUEL ?Take 0.5 tablets (12.5 mg total) by mouth at bedtime. ?  ? ?  ? ? ?Discharge Assessment: ?Vitals:  ? 06/23/21 0423 06/23/21 0813  ?BP: (!) 126/52 134/70  ?Pulse: (!) 58 66  ?Resp: 18 18  ?Temp:    ?SpO2: 95%   ? Skin clean, dry and intact without evidence of skin break down, no evidence of skin tears noted. ?IV catheter discontinued intact. Site without signs and symptoms of complications - no redness or edema noted at insertion site, patient denies c/o pain - only slight tenderness at site.  Dressing with slight pressure applied. ? ?D/c Instructions-Education: ?Discharge instructions given to patient/family with verbalized understanding. ?D/c education completed with patient/family including follow up instructions, medication list, d/c activities limitations if indicated, with other d/c instructions as indicated by MD - patient able to verbalize understanding, all questions fully answered. ?Patient instructed to return to ED, call 911, or call MD for any changes in condition.  ?Patient escorted via  WC, and D/C home via private auto. ? ?Clovis Fredrickson, LPN ?05/01/3670 5:50 PM  ?

## 2021-06-23 NOTE — Assessment & Plan Note (Signed)
Stable at this time 

## 2021-06-23 NOTE — Care Management Important Message (Signed)
Important Message ? ?Patient Details  ?Name: Allison Watson ?MRN: [id number] ?Date of Birth: [birth year] ? ? ?Medicare Important Message Given:  Yes - Important Message mailed due to current National Emergency ? ? ? ? ?Tommy Medal ?06/23/2021, 2:58 PM ?

## 2021-06-23 NOTE — Consult Note (Signed)
Consultation Note Date: 06/23/2021   Patient Name: Allison Watson  DOB: [birth year]  MRN: [id number]  Age / Sex: 81 y.o., female  PCP: Allison Squibb, MD Referring Physician: Murlean Iba, MD  Reason for Consultation: Establishing goals of care  HPI/Patient Profile: 81 y.o. female  with past medical history of dementia with behavioral disturbance requiring Seroquel/Allison Watson, HTN, falls with recent admission for intraparenchymal bleed, gout, obesity admitted on 06/20/2021 with failure to thrive, dehydration.   Clinical Assessment and Goals of Care: I have reviewed medical records including EPIC notes, labs and imaging, received report from RN, assessed the patient.    Call to son, Allison Watson, to discuss diagnosis prognosis, Allison Watson, [name], disposition and options.  I introduced Palliative Medicine as specialized medical care for people living with serious illness. It focuses on providing relief from the symptoms and stress of a serious illness. The goal is to improve quality of life for both the patient and the family.  We discussed a brief life review of the patient.  Allison Watson was the youngest of 69 children.  Her family will share cropper's.  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  She had several college scholarships, but came to [place] to work in [place].  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  She just stopped working about a year ago.  She has been legally separated from her husband for over 20 years.  She has 2 children, Allison Watson and Allison Watson.  We then focused on their current illness.  We talk about advancing dementia.  We talked about neurology outpatient visits in the past.  We talk about the chronic illness pathway in relation to memory loss including psychological declines, physiological declines, nutritional declines.    We  talked about poor by mouth intake.  Allison Watson shares that they had been providing Allison Watson with pured foods because they felt that she was having trouble swallowing post brain bleed.  We talked in detail about speech therapy consult and recommendations, the normal changes that occur as memory loss advances.  We talked about PEG tube placement as not recommended by any discipline.  I shared that Allison Watson expressed to me that she would not want PEG to.  I shared that the medical team fears that she would pull it.  Allison Watson agrees.  He states that he and his sister also feel that she would not keep PEG tube in place.  We talked about providing pleasure foods, being mindful that she will become dehydrated again.  I encouraged Allison Watson to consider what this time looks like and feels like for Allison Watson.  The natural disease trajectory and expectations at EOL were discussed.  We talked about how to make choices for loved ones including 1) keeping them at the center of decision-making 2) are we doing something for them or to them 3) the person Allison Watson was 10 years ago, how would that woman tell them to care  for her now.  Advanced directives, concepts specific to code status, artifical feeding and hydration, and rehospitalization were considered and discussed.  At this point no PEG tube.  Continue full scope/full code.  Allison Watson shares that Allison Watson has stated since she was in her 14s that she would want life support.  She would not want this if she were in a vegetative state.  Allison Watson shares a story of frailty in his father, and changing his CODE STATUS from full code to DNR.  He shares that when they see frailty and their mother he and his sister will change her CODE STATUS to DNR.  We talk about disposition.  More he shares that they had always told Allison Watson that they would keep her at home.  He would like short-term rehab, but ultimately returned to Allison Watson's home with 24/7 care provided by  family.  He shares that Allison Watson has been in contact with some of Allison Watson's former coworkers at [employer] on [place] [employer].  They are working to get more equipment and services in the home.  I encouraged him to work closely with Allison Watson at short-term rehab for further needs.  Palliative Care services outpatient were explained and offered.  We talked about the benefits of outpatient palliative continue goals of care discussion, talking about the "what if's and maybe's".  Allison Watson is agreeable to outpatient palliative services.  Provider choice offered, hosp[facility] of [place] [facility].  Transition of care team updated.  Discussed the importance of continued conversation with family and the medical providers regarding overall plan of care and treatment options, ensuring decisions are within the context of the patients values and GOCs. Questions and concerns were addressed.  The family was encouraged to call with questions or concerns.  PMT will continue to support holistically.  Conference with attending, bedside nursing staff, transition of care team related to patient condition, needs, goals of care, disposition.    HCPOA NEXT OF KIN -son and daughter, Allison Watson and Allison Watson    SUMMARY OF RECOMMENDATIONS   At this point continue to treat the treatable Would accept CPR/intubation, but not long-term Short-term rehab with ultimate goal to return home Agreeable to outpatient palliative services with [facility] [place] [facility]: Full code -see note above  Symptom Management:  Per hospitalist, no additional needs at this time.  Palliative Prophylaxis:  Frequent Pain Assessment and Oral Care  Additional Recommendations (Limitations, Scope, Preferences): Full Scope Treatment  Psycho-social/Spiritual:  Desire for further Chaplaincy support:no Additional Recommendations: Caregiving  Support/Resources and [facility]  Prognosis:  Unable to  determine, based on outcomes.  3 to 6 months or less would not be surprising based on decreasing functional status, poor by mouth intake, advancing dementia.  Discharge Planning:  Requesting short-term rehab, ultimately to return home.  Outpatient palliative services with [facility].       Primary Diagnoses: Present on Admission:  Failure to thrive in adult  COVID-19 virus infection  Dehydration  Essential hypertension  Malnutrition of moderate degree   I have reviewed the medical record, interviewed the patient and family, and examined the patient. The following aspects are pertinent.  Past Medical History:  Diagnosis Date   Arthritis    Lt arm   Gout    Hypertension    Social History   Socioeconomic History   Marital status: Legally Separated    Spouse name: Not on file   Number of children: Not on file  Years of Allison: Not on file   Highest Allison level: Not on file  Occupational History   Not on file  Tobacco Use   Smoking status: Former   Smokeless tobacco: Never  Vaping Use   Vaping Use: Never used  Substance and Sexual Activity   Alcohol use: No   Drug use: No   Sexual activity: Not on file  Other Topics Concern   Not on file  Social History Narrative   Not on file   Social Determinants of Health   Watson Resource Strain: Not on file  Food Insecurity: Not on file  Transportation Needs: Not on file  Physical Activity: Not on file  Stress: Not on file  Social Connections: Not on file   History reviewed. No pertinent family history. Scheduled Meds:  divalproex  250 mg Oral Q12H   feeding supplement  237 mL Oral BID BM   hydrALAZINE  50 mg Oral TID   labetalol  100 mg Oral BID   Ensure Max Protein  11 oz Oral Daily   QUEtiapine  12.5 mg Oral QHS   Continuous Infusions:  dextrose 5 % and 0.45% NaCl 60 mL/hr at 06/22/21 1814   magnesium sulfate bolus IVPB     PRN Meds:.acetaminophen **OR** acetaminophen, albuterol,  clonazePAM, ondansetron **OR** ondansetron (ZOFRAN) IV, oxyCODONE Medications Prior to Admission:  Prior to Admission medications   Medication Sig Start Date End Date Taking? Authorizing Provider  albuterol (VENTOLIN HFA) 108 (90 Base) MCG/ACT inhaler Inhale 1-2 puffs into the lungs every 6 (six) hours as needed for wheezing or shortness of breath. Will use when too hot/ too cold outside   Yes [provider]  cholecalciferol (VITAMIN D3) 25 MCG (1000 UNIT) tablet Take 1,000 Units by mouth daily.   Yes [provider]  clonazePAM (Allison Watson) 1 MG tablet Take 1 mg by mouth 2 (two) times daily as needed for anxiety. 05/20/21  Yes [provider]  cyanocobalamin (,VITAMIN B-12,) 1000 MCG/ML injection Inject 1,000 mcg into the skin every 30 (thirty) days. 12/09/20  Yes [provider]  divalproex (DEPAKOTE) 250 MG DR tablet Take 1 tablet (250 mg total) by mouth every 12 (twelve) hours. 03/31/21 06/20/21 Yes Shahmehdi, Seyed A, MD  hydrALAZINE (APRESOLINE) 50 MG tablet Take 1 tablet (50 mg total) by mouth 3 (three) times daily. 06/01/21  Yes Hosie Poisson, MD  labetalol (NORMODYNE) 100 MG tablet Take 1 tablet (100 mg total) by mouth 2 (two) times daily. 10/09/20  Yes Roxan Hockey, MD  Multiple Vitamin (MULTIVITAMIN) tablet Take 1 tablet by mouth daily.   Yes [provider]  QUEtiapine (SEROQUEL) 25 MG tablet Take 0.5 tablets (12.5 mg total) by mouth at bedtime. 06/01/21  Yes Hosie Poisson, MD   Allergies  Allergen Reactions   Codeine Rash   Lorazepam Other (See Comments)    Hallucinations per daughter Allison Watson   Penicillins Rash    Has patient had a PCN reaction causing immediate rash, facial/tongue/throat swelling, SOB or lightheadedness with hypotension: Yes Has patient had a PCN reaction causing severe rash involving mucus membranes or skin necrosis: No Has patient had a PCN reaction that required hospitalization No Has patient had a PCN reaction occurring  within the last 10 years: No If all of the above answers are "NO", then may proceed with Cephalosporin use.    Review of Systems  Unable to perform ROS: Dementia   Physical Exam Vitals and nursing note reviewed.  Constitutional:      General:  She is not in acute distress.    Appearance: She is obese.  Cardiovascular:     Rate and Rhythm: Normal rate.  Pulmonary:     Effort: Pulmonary effort is normal. No respiratory distress.  Skin:    General: Skin is warm and dry.  Neurological:     Mental Status: She is alert.     Comments: Known dementia, oriented to person and place  Psychiatric:     Comments: Calm, not fearful    Vital Signs: BP 134/70 (BP Location: Left Arm)    Pulse 66    Temp 98.6 F (37 C) (Oral)    Resp 18    Ht 5' (1.524 m)    Wt 72.6 kg    SpO2 95%    BMI 31.26 kg/m  Pain Scale: 0-10 POSS *See Group Information*: 1-Acceptable,Awake and alert Pain Score: 0-No pain   SpO2: SpO2: 95 % O2 Device:SpO2: 95 % O2 Flow Rate: .O2 Flow Rate (L/min): 0 L/min  IO: Intake/output summary:  Intake/Output Summary (Last 24 hours) at 06/23/2021 0827 Last data filed at 06/22/2021 1517 Gross per 24 hour  Intake 240 ml  Output --  Net 240 ml    LBM: Last BM Date : 06/22/21 Baseline Weight: Weight: 72.6 kg Most recent weight: Weight: 72.6 kg     Palliative Assessment/Data:   Flowsheet Rows    Flowsheet Row Most Recent Value  Intake Tab   Referral Department Hospitalist  Unit at Time of Referral Med/Surg Unit  Palliative Care Primary Diagnosis Other (Comment)  Date Notified 06/22/21  Palliative Care Type New Palliative care  Reason for referral Clarify Goals of Care  Date of Admission 06/20/21  Date first seen by Palliative Care 06/23/21  # of days Palliative referral response time 1 Day(s)  # of days IP prior to Palliative referral 2  Clinical Assessment   Palliative Performance Scale Score 30%  Pain Max last 24 hours Not able to report  Pain Min Last 24 hours  Not able to report  Dyspnea Max Last 24 Hours Not able to report  Dyspnea Min Last 24 hours Not able to report  Psychosocial & Spiritual Assessment   Palliative Care Outcomes        Time In: 1240 Time Out: 1355 Time Total: 75 minutes  Greater than 50%  of this time was spent counseling and coordinating care related to the above assessment and plan.  Signed by: Drue Novel, NP   Please contact Palliative Medicine Team phone at [phone] for questions and concerns.  For individual provider: See Shea Evans

## 2021-06-23 NOTE — Progress Notes (Signed)
Report given to [facility] ?

## 2021-07-23 DIAGNOSIS — M6281 Muscle weakness (generalized): Secondary | ICD-10-CM | POA: Diagnosis not present

## 2021-08-03 DIAGNOSIS — D631 Anemia in chronic kidney disease: Secondary | ICD-10-CM | POA: Diagnosis not present

## 2021-08-03 DIAGNOSIS — M109 Gout, unspecified: Secondary | ICD-10-CM | POA: Diagnosis not present

## 2021-08-03 DIAGNOSIS — N189 Chronic kidney disease, unspecified: Secondary | ICD-10-CM | POA: Diagnosis not present

## 2021-08-03 DIAGNOSIS — M1909 Primary osteoarthritis, other specified site: Secondary | ICD-10-CM | POA: Diagnosis not present

## 2021-08-03 DIAGNOSIS — E669 Obesity, unspecified: Secondary | ICD-10-CM | POA: Diagnosis not present

## 2021-08-03 DIAGNOSIS — N390 Urinary tract infection, site not specified: Secondary | ICD-10-CM | POA: Diagnosis not present

## 2021-08-03 DIAGNOSIS — G4700 Insomnia, unspecified: Secondary | ICD-10-CM | POA: Diagnosis not present

## 2021-08-03 DIAGNOSIS — G9341 Metabolic encephalopathy: Secondary | ICD-10-CM | POA: Diagnosis not present

## 2021-08-03 DIAGNOSIS — I129 Hypertensive chronic kidney disease with stage 1 through stage 4 chronic kidney disease, or unspecified chronic kidney disease: Secondary | ICD-10-CM | POA: Diagnosis not present

## 2021-08-22 DIAGNOSIS — M6281 Muscle weakness (generalized): Secondary | ICD-10-CM | POA: Diagnosis not present

## 2021-09-12 ENCOUNTER — Ambulatory Visit: Payer: Medicare HMO | Admitting: Nurse Practitioner

## 2021-09-21 DIAGNOSIS — Z1159 Encounter for screening for other viral diseases: Secondary | ICD-10-CM | POA: Diagnosis not present

## 2021-09-21 DIAGNOSIS — N1831 Chronic kidney disease, stage 3a: Secondary | ICD-10-CM | POA: Diagnosis not present

## 2021-09-21 DIAGNOSIS — R7303 Prediabetes: Secondary | ICD-10-CM | POA: Diagnosis not present

## 2021-09-21 DIAGNOSIS — G250 Essential tremor: Secondary | ICD-10-CM | POA: Diagnosis not present

## 2021-09-21 DIAGNOSIS — Z0001 Encounter for general adult medical examination with abnormal findings: Secondary | ICD-10-CM | POA: Diagnosis not present

## 2021-09-21 DIAGNOSIS — Z1329 Encounter for screening for other suspected endocrine disorder: Secondary | ICD-10-CM | POA: Diagnosis not present

## 2021-09-21 DIAGNOSIS — Z79899 Other long term (current) drug therapy: Secondary | ICD-10-CM | POA: Diagnosis not present

## 2021-09-21 DIAGNOSIS — G4700 Insomnia, unspecified: Secondary | ICD-10-CM | POA: Diagnosis not present

## 2021-09-21 DIAGNOSIS — E538 Deficiency of other specified B group vitamins: Secondary | ICD-10-CM | POA: Diagnosis not present

## 2021-09-21 DIAGNOSIS — I10 Essential (primary) hypertension: Secondary | ICD-10-CM | POA: Diagnosis not present

## 2021-09-22 DIAGNOSIS — M6281 Muscle weakness (generalized): Secondary | ICD-10-CM | POA: Diagnosis not present

## 2021-10-17 DIAGNOSIS — Z1331 Encounter for screening for depression: Secondary | ICD-10-CM | POA: Diagnosis not present

## 2021-10-17 DIAGNOSIS — Z1389 Encounter for screening for other disorder: Secondary | ICD-10-CM | POA: Diagnosis not present

## 2021-10-17 DIAGNOSIS — N1831 Chronic kidney disease, stage 3a: Secondary | ICD-10-CM | POA: Diagnosis not present

## 2021-10-17 DIAGNOSIS — E538 Deficiency of other specified B group vitamins: Secondary | ICD-10-CM | POA: Diagnosis not present

## 2021-10-17 DIAGNOSIS — Z0001 Encounter for general adult medical examination with abnormal findings: Secondary | ICD-10-CM | POA: Diagnosis not present

## 2021-10-17 DIAGNOSIS — I10 Essential (primary) hypertension: Secondary | ICD-10-CM | POA: Diagnosis not present

## 2021-10-22 DIAGNOSIS — M6281 Muscle weakness (generalized): Secondary | ICD-10-CM | POA: Diagnosis not present

## 2021-10-31 DIAGNOSIS — R251 Tremor, unspecified: Secondary | ICD-10-CM | POA: Diagnosis not present

## 2021-10-31 DIAGNOSIS — I10 Essential (primary) hypertension: Secondary | ICD-10-CM | POA: Diagnosis not present

## 2021-10-31 DIAGNOSIS — G4700 Insomnia, unspecified: Secondary | ICD-10-CM | POA: Diagnosis not present

## 2021-10-31 DIAGNOSIS — N1831 Chronic kidney disease, stage 3a: Secondary | ICD-10-CM | POA: Diagnosis not present

## 2021-11-14 DIAGNOSIS — N1831 Chronic kidney disease, stage 3a: Secondary | ICD-10-CM | POA: Diagnosis not present

## 2021-11-14 DIAGNOSIS — E538 Deficiency of other specified B group vitamins: Secondary | ICD-10-CM | POA: Diagnosis not present

## 2021-11-14 DIAGNOSIS — I10 Essential (primary) hypertension: Secondary | ICD-10-CM | POA: Diagnosis not present

## 2021-11-22 DIAGNOSIS — M7052 Other bursitis of knee, left knee: Secondary | ICD-10-CM | POA: Diagnosis not present

## 2021-11-22 DIAGNOSIS — M6281 Muscle weakness (generalized): Secondary | ICD-10-CM | POA: Diagnosis not present

## 2021-12-06 ENCOUNTER — Ambulatory Visit: Payer: Medicare HMO | Admitting: Neurology

## 2021-12-12 DIAGNOSIS — E539 Vitamin B deficiency, unspecified: Secondary | ICD-10-CM | POA: Diagnosis not present

## 2021-12-15 ENCOUNTER — Encounter: Payer: Self-pay | Admitting: *Deleted

## 2021-12-19 ENCOUNTER — Ambulatory Visit (INDEPENDENT_AMBULATORY_CARE_PROVIDER_SITE_OTHER): Payer: Medicare HMO | Admitting: Neurology

## 2021-12-19 ENCOUNTER — Encounter: Payer: Self-pay | Admitting: Neurology

## 2021-12-19 VITALS — BP 122/50 | HR 74 | Ht 60.0 in | Wt 130.6 lb

## 2021-12-19 DIAGNOSIS — Z8669 Personal history of other diseases of the nervous system and sense organs: Secondary | ICD-10-CM

## 2021-12-19 DIAGNOSIS — R296 Repeated falls: Secondary | ICD-10-CM

## 2021-12-19 DIAGNOSIS — G20C Parkinsonism, unspecified: Secondary | ICD-10-CM

## 2021-12-19 DIAGNOSIS — G20 Parkinson's disease: Secondary | ICD-10-CM

## 2021-12-19 NOTE — Patient Instructions (Addendum)
I think you have signs and symptoms of parkinson's disease, or parkinson's like disease, called parkinsonism.   I do want to suggest a few things today:  We will monitor your exam and your memory.   Remember to drink plenty of fluid at least 6 glasses (8 oz each), eat healthy meals and do not skip any meals. Try to eat protein with a every meal and eat a healthy snack such as fruit or nuts in between meals. Try to keep a regular sleep-wake schedule and try to exercise daily, particularly in the form of walking, 20-30 minutes a day, if you can.  As far as your medications are concerned, we may consider a new medication soon.   For now, as far as diagnostic testing, I will order a DaT scan: This is a specialized brain scan designed to help with diagnosis of tremor disorders. A radioactive marker gets injected and the uptake is measured in the brain and compared to normal controls and right side is compared to the left, a change in uptake can help with diagnosis of certain tremor disorders. A brain MRI on the other hand is a brain scan that helps look at the brain structure in more detail overall and look for age-related changes, blood vessel related changes and look for stroke and volume loss which we call atrophy.   We will check your kidney function to make sure you can take the DaTscan safely.  We will call you with the results and plan to follow-up afterwards.  We may consider medication for Parkinson's disease at her next visit.  Our phone number is [phone]. We also have an after hours call service for urgent matters and there is a physician on-call for urgent questions, that cannot wait till the next work day. For any emergencies you know to call 911 or go to the nearest emergency room.   You can email me through my chart and also leave a phone message for my nurse.

## 2021-12-19 NOTE — Progress Notes (Signed)
Subjective:    Patient ID: Allison Watson is a 81 y.o. female.  HPI    [name], MD, PhD [facility] [address], [address] [place], [place] [address]  Dear Dr. Legrand Rams,  I saw your patient, Allison Watson, upon your kind request and my neurologic clinic today for initial consultation of her gait disorder, recurrent falls and tremors, concern for parkinsonism.  The patient is accompanied by her son today.  As you know, [name] is a 81 year old right-handed woman with an underlying medical history of arthritis, gout, hypertension, chronic kidney disease, hypertension, and borderline overweight state with recent weight loss and dysphagia, who reports an approximately 1 year history of difficulty with her balance and fine motor control as well as tremors, primarily noticed in her right upper extremity.  I reviewed your office records.  She had an office visit on 10/31/2021.  She had blood work through your office on 09/21/2021 and I was able to review the results.  Lipid panel showed total cholesterol of 176, HDL 55, LDL 103, BUN 15 but creatinine elevated at 1.34, glucose elevated at 139.  A1c 5.3.  Otherwise normal BMP, liver function normal with alk phos 65, AST 12, ALT 8.  TSH normal at 1.98, total T4 normal at 5.7.  CBC with differential and platelets showed benign findings, B12 on the lower end of the spectrum at 373.  Folate borderline low at 4.9.  Of note, she had recent hospitalization for failure to thrive.  She had also an admission last year for concern for brain hemorrhage.  She was hospitalized from 07/18/2021 through 06/23/2021 for failure to thrive and dysphagia.  She had also fallen about 3 days prior to admission.  She had a head CT without contrast on 06/20/2021 and I reviewed the results: Impression: Stable head CT, no acute intracranial process. She had her cervical spine CT without contrast and head CT without contrast after a fall on 06/09/2021 and  I reviewed the results: Impression:No evidence of acute intracranial abnormality. Small vessel ischemic changes.   No evidence of traumatic injury to the cervical spine. Mild degenerative changes. She had a CTU angiogram of the head with and without contrast on 05/30/2021 and I reviewed the results: IMPRESSION: 1. Negative CTA of the head. No aneurysm or other vascular malformation identified. 2. Mild atheromatous change about the carotid siphons without hemodynamically significant or correctable stenosis. She had a brain MRI with and without contrast on 05/17/2021 and I reviewed the results: Impression:No acute intracranial abnormality   Mild to moderate chronic microvascular ischemic change in the white matter. ADDENDUM: Comparison made with the MRI of 10/05/2020 revealing abnormality in the right parietal lobe. This abnormality has resolved and now appears normal. The sulcal FLAIR hyperintensity has resolved. Subarachnoid enhancement also has resolved. This may have been due to infection, hemorrhage, seizure disorder or other inflammatory process in the subarachnoid space.  Her son reports that she had a significant acute illness a year ago.  She was hospitalized in June 5681 with metabolic encephalopathy.  She had presented with hallucinations.  She had extensive work-up during that hospitalization.  She was felt to be B12 deficient and was started on supplementation.  She even had spinal fluid testing which was unremarkable.  There was some concern that she could have underlying seizures, she was started on Depakote.  She has not been on Depakote for the past 8 months per her son.  She has seen Dr. Merlene Laughter as  an outpatient but no longer sees him.  She no longer takes any clonazepam.  She reports feeling a lot better overall.  Her son reports that she has improved quite a bit but she has a tremor and difficulty with fine motor control, no recent falls, has not been using a walker but  previously used a walker.  She has been on trazodone for sleep but does not believe it helps.  She has tried melatonin which caused her to be drowsy the next day and she had headaches.  She is no longer on Seroquel.  Apparently, she also was treated with Zyprexa in the past.  She feels that her tremor is worse in the right upper extremity.  She has no family history of Parkinson's disease.  She does not drink any alcohol, does drink a little bit more water but averages about 2 bottles of water per day.  She drinks caffeine in the form of soda, about 1 can/day on average.  She lives with her grandson.  She has 2 children, 1 son, 1 daughter.  Her Past Medical History Is Significant For: Past Medical History:  Diagnosis Date   Arthritis    Lt arm   Gout    Hypertension     Her Past Surgical History Is Significant For: Past Surgical History:  Procedure Laterality Date   ABDOMINAL HYSTERECTOMY     TUBAL LIGATION      Her Family History Is Significant For: Family History  Problem Relation Age of Onset   Hypertension Mother    Uterine cancer Mother    Alzheimer's disease Father    Breast cancer Sister     Her Social History Is Significant For: Social History   Socioeconomic History   Marital status: Legally Separated    Spouse name: Not on file   Number of children: Not on file   Years of education: Not on file   Highest education level: Not on file  Occupational History   Not on file  Tobacco Use   Smoking status: Former   Smokeless tobacco: Never  Vaping Use   Vaping Use: Never used  Substance and Sexual Activity   Alcohol use: No   Drug use: No   Sexual activity: Not on file  Other Topics Concern   Not on file  Social History Narrative   Caffeine tea/soda 1-2 can.    Social Determinants of Health   Financial Resource Strain: Not on file  Food Insecurity: Not on file  Transportation Needs: Not on file  Physical Activity: Not on file  Stress: Not on file  Social  Connections: Not on file    Her Allergies Are:  Allergies  Allergen Reactions   Codeine Rash   Lorazepam Other (See Comments)    Hallucinations per daughter [name]   Penicillins Rash    Has patient had a PCN reaction causing immediate rash, facial/tongue/throat swelling, SOB or lightheadedness with hypotension: Yes Has patient had a PCN reaction causing severe rash involving mucus membranes or skin necrosis: No Has patient had a PCN reaction that required hospitalization No Has patient had a PCN reaction occurring within the last 10 years: No If all of the above answers are "NO", then may proceed with Cephalosporin use.   :   Her Current Medications Are:  Outpatient Encounter Medications as of 12/19/2021  Medication Sig   amLODipine (NORVASC) 5 MG tablet Take 5 mg by mouth daily.   cyanocobalamin (,VITAMIN B-12,) 1000 MCG/ML injection Inject 1,000 mcg into  the skin every 30 (thirty) days.   hydrALAZINE (APRESOLINE) 50 MG tablet Take 50 mg by mouth in the morning, at noon, and at bedtime.   losartan-hydrochlorothiazide (HYZAAR) 100-12.5 MG tablet Take 1 tablet by mouth daily.   meloxicam (MOBIC) 7.5 MG tablet Take 7.5 mg by mouth at bedtime.   traZODone (DESYREL) 100 MG tablet Take 100 mg by mouth at bedtime as needed. 30 minutes before bed.   divalproex (DEPAKOTE) 250 MG DR tablet Take 1 tablet (250 mg total) by mouth every 12 (twelve) hours. (Patient not taking: Reported on 12/19/2021)   Ensure Max Protein (ENSURE MAX PROTEIN) LIQD Take 330 mLs (11 oz total) by mouth daily. (Patient not taking: Reported on 12/19/2021)   Multiple Vitamin (MULTIVITAMIN) tablet Take 1 tablet by mouth daily. (Patient not taking: Reported on 12/19/2021)   [DISCONTINUED] albuterol (VENTOLIN HFA) 108 (90 Base) MCG/ACT inhaler Inhale 1-2 puffs into the lungs every 6 (six) hours as needed for wheezing or shortness of breath. Will use when too hot/ too cold outside (Patient not taking: Reported on 12/19/2021)    [DISCONTINUED] cholecalciferol (VITAMIN D3) 25 MCG (1000 UNIT) tablet Take 1,000 Units by mouth daily. (Patient not taking: Reported on 12/19/2021)   [DISCONTINUED] clonazePAM (KLONOPIN) 0.25 MG disintegrating tablet Take 1 tablet (0.25 mg total) by mouth 2 (two) times daily as needed (anxiety/agitation). (Patient not taking: Reported on 12/19/2021)   [DISCONTINUED] feeding supplement (ENSURE ENLIVE / ENSURE PLUS) LIQD Take 237 mLs by mouth 2 (two) times daily between meals. (Patient not taking: Reported on 12/19/2021)   [DISCONTINUED] folic acid (FOLVITE) 1 MG tablet  (Patient not taking: Reported on 12/19/2021)   [DISCONTINUED] QUEtiapine (SEROQUEL) 25 MG tablet Take 0.5 tablets (12.5 mg total) by mouth at bedtime. (Patient not taking: Reported on 12/19/2021)   No facility-administered encounter medications on file as of 12/19/2021.  :   Review of Systems:  Out of a complete 14 point review of systems, all are reviewed and negative with the exception of these symptoms as listed below:   Review of Systems  Neurological:        Pt here for tremors in both hands.  R dominant worse then L.      Objective:  Neurological Exam  Physical Exam Physical Examination:   Vitals:   12/19/21 1501  BP: (!) 122/50  Pulse: 74  SpO2: 98%   General Examination: The patient is a very pleasant 81 y.o. female in no acute distress. She appears well-developed and well-nourished and well groomed.   HEENT: Normocephalic, atraumatic, pupils are equal, round and reactive to light, extraocular tracking is good without limitation to gaze excursion or nystagmus noted. Hearing is grossly intact. Face is symmetric with normal facial animation. Speech is clear with no dysarthria noted. There is no hypophonia. There is no lip, neck/head, jaw or voice tremor. Neck with mild nuchal rigidity but full range of passive and active motion. There are no carotid bruits on auscultation. Oropharynx exam reveals: mild mouth dryness,  adequate dental hygiene with nearly edentulous state on top and some missing teeth on the bottom.  Tongue protrudes centrally and palate elevates symmetrically, no abnormal involuntary movements noted in the tongue or face area.    Chest: Clear to auscultation without wheezing, rhonchi or crackles noted.  Heart: S1+S2+0, regular and normal without murmurs, rubs or gallops noted.   Abdomen: Soft, non-tender and non-distended with normal bowel sounds appreciated on auscultation.  Extremities: There is no pitting edema in the distal lower extremities  bilaterally.   Skin: Warm and dry without trophic changes noted.   Musculoskeletal: exam reveals no obvious joint deformities.   Neurologically:  Mental status: The patient is awake, alert and oriented to situation and self, also able to provide fairly good concise history, details are provided by her son. Thought process is linear. Mood is normal and affect is normal.  Cranial nerves II - XII are as described above under HEENT exam.  Motor exam: Normal bulk, global strength of at least 4 out of 5, no focal weakness or atrophy.  She has a mildly increased tone in both upper extremities with slight cogwheeling.  She has a fairly consistent resting tremor on the left more than right upper extremity and intermittent mild resting tremor in the left more than right lower extremities.  Beather Arbour is not tested for safety concerns, reflexes are 1+ throughout, fine motor skills and coordination: She has mild difficulty with finger taps, hand movements and foot taps in the right upper and lower extremities, mild to moderately impaired in the left upper and lower extremities.    On 12/19/2021: On Archimedes spiral drawing she has mild trembling with the left hand, no significant tremor with the right hand.  Handwriting with the right hand is legible, not tremulous, very neat, not particularly micrographic.  She has an intermittent postural tremor in both upper  extremities.  Cerebellar testing: No dysmetria or intention tremor. There is no truncal or gait ataxia.  Sensory exam: intact to light touch in the upper and lower extremities.  Gait, station and balance: She stands without difficulty, needs no assistance.  Posture is age-appropriate.  She walks with a slight decrease in arm swing on the left more than right, no obvious shuffling.  No walking aids.  Assessment and Plan:    In summary, CHRISHAUNA MEE is a very pleasant 81 y.o.-year old female with an underlying medical history of arthritis, gout, hypertension, chronic kidney disease, hypertension, and borderline overweight state with recent weight loss and dysphagia, who presents for evaluation of her tremor disorder, fine motor dyscontrol, balance issues, falls.  She had a significant decline in her functioning last year, was felt to have metabolic encephalopathy, was on multiple psychotropic medications in the past.  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  She has evidence of parkinsonism, left-sided predominance is noted with lateralization of her fine motor dyscontrol to the left.  She has had multiple scans in the recent past.  She had work-up for encephalopathy about a year ago.  She had extensive testing within the past year.  I had a long discussion with the patient and her son about her presentation and symptoms, thankfully, by chart review and in talking with the patient and her son, she has had tremendous improvement over the past several months.  Nevertheless, I would like to consider treatment with symptomatic medication such as generic Sinemet.  For now, I would like to see if we can proceed with a DaTscan which will help [place] with her diagnosis as she may have been at risk for drug-induced parkinsonism at least in the past.  If her kidney function is okay and her  creatinine level is one-point low, we can proceed with a DaTscan.  She was provided information about this test.  We will plan to follow-up after the scan and keep them posted as to the results by phone  call for now.  This was an extended visit of over 1 hour with copious chart review involved with record review of your office records as well as electronic chart review.  I answered all their questions today and the patient and her son were in agreement with our plan.   Thank you very much for allowing me to participate in the care of this nice patient. If I can be of any further assistance to you please do not hesitate to call me at [phone].  Sincerely,   [name], MD, PhD

## 2021-12-20 LAB — COMPREHENSIVE METABOLIC PANEL
ALT: 7 IU/L (ref 0–32)
AST: 13 IU/L (ref 0–40)
Albumin/Globulin Ratio: 1.7 (ref 1.2–2.2)
Albumin: 4.3 g/dL (ref 3.8–4.8)
Alkaline Phosphatase: 84 IU/L (ref 44–121)
BUN/Creatinine Ratio: 15 (ref 12–28)
BUN: 26 mg/dL (ref 8–27)
Bilirubin Total: 0.3 mg/dL (ref 0.0–1.2)
CO2: 26 mmol/L (ref 20–29)
Calcium: 10.2 mg/dL (ref 8.7–10.3)
Chloride: 107 mmol/L — ABNORMAL HIGH (ref 96–106)
Creatinine, Ser: 1.68 mg/dL — ABNORMAL HIGH (ref 0.57–1.00)
Globulin, Total: 2.6 g/dL (ref 1.5–4.5)
Glucose: 81 mg/dL (ref 70–99)
Potassium: 4.6 mmol/L (ref 3.5–5.2)
Sodium: 146 mmol/L — ABNORMAL HIGH (ref 134–144)
Total Protein: 6.9 g/dL (ref 6.0–8.5)
eGFR: 31 mL/min/{1.73_m2} — ABNORMAL LOW (ref >59)

## 2021-12-21 ENCOUNTER — Telehealth: Payer: Self-pay | Admitting: *Deleted

## 2021-12-21 NOTE — Telephone Encounter (Signed)
-----   Message from [name], MD sent at 12/20/2021  8:39 AM EDT ----- Kidney function has become worse. Please ask patient or son to make a follow-up appointment with primary care and try to improve hydration in the meantime, 6 to 8 cups of water per day are recommended generally speaking.  Her sodium and chloride levels are slightly higher as well which may indicate dehydration.  For now, we have to hold the DAT scan until her kidney function is better.  I believe we can go ahead with the authorization but please remind them that we will not schedule the appointment or if they get a phone call to schedule the appointment, we have to put a hold on it d/t impaired kidney function.  I would recommend repeating chemistry panel with primary care in the next month or 2 and we can reassess her ability to go through with the DaTscan at the time based on lab results.

## 2021-12-21 NOTE — Telephone Encounter (Signed)
Lmvm for pts son to return call about lab results and pending test to be delayed.

## 2021-12-22 NOTE — Telephone Encounter (Signed)
Pt's son, Alfonso Ramus, called back and I discussed the patient's lab results with him.  He understands the patient's kidney function has become worse.  He is going to call patient's primary care now to schedule an appointment and follow-up.  I have faxed the results over to Dr. Legrand Rams.  The patient's son understands in the meantime to make sure the patient is hydrated with 6 to 8 cups of water per day as that is generally the recommended amount.  He also understands we will hold on the DaTscan for now so if he receives a call to schedule he should wait at this time.  He understands Dr. Rexene Alberts recommends that patient's primary care repeat the chemistry panel in the next month or 2 and then we can reassess her ability to go through with the DaTscan in regards to her kidney function.

## 2022-01-10 ENCOUNTER — Telehealth: Payer: Self-pay | Admitting: Neurology

## 2022-01-10 NOTE — Telephone Encounter (Signed)
[employer] [facility] ref: [id number] sent to [facility] nuclear medicine

## 2022-01-23 DIAGNOSIS — I10 Essential (primary) hypertension: Secondary | ICD-10-CM | POA: Diagnosis not present

## 2022-01-23 DIAGNOSIS — E538 Deficiency of other specified B group vitamins: Secondary | ICD-10-CM | POA: Diagnosis not present

## 2022-01-23 DIAGNOSIS — N1831 Chronic kidney disease, stage 3a: Secondary | ICD-10-CM | POA: Diagnosis not present

## 2022-01-23 DIAGNOSIS — G250 Essential tremor: Secondary | ICD-10-CM | POA: Diagnosis not present

## 2022-01-31 ENCOUNTER — Encounter (HOSPITAL_COMMUNITY)
Admission: RE | Admit: 2022-01-31 | Discharge: 2022-01-31 | Disposition: A | Discharge status: Home / Self Care | Payer: Medicare HMO | Source: Ambulatory Visit | Attending: Neurology | Admitting: Neurology

## 2022-01-31 DIAGNOSIS — R296 Repeated falls: Secondary | ICD-10-CM

## 2022-01-31 DIAGNOSIS — G20C Parkinsonism, unspecified: Secondary | ICD-10-CM

## 2022-01-31 DIAGNOSIS — Z8669 Personal history of other diseases of the nervous system and sense organs: Secondary | ICD-10-CM

## 2022-01-31 MED ORDER — POTASSIUM IODIDE (ANTIDOTE) 130 MG PO TABS
ORAL_TABLET | ORAL | Status: AC
Start: 2022-01-31 — End: 2022-01-31
  Administered 2022-01-31: 130 mg via ORAL
  Filled 2022-01-31: qty 1

## 2022-01-31 MED ORDER — IOFLUPANE I 123 185 MBQ/2.5ML IV SOLN
4.6000 | Freq: Once | INTRAVENOUS | Status: AC | PRN
Start: 2022-01-31 — End: 2022-01-31
  Administered 2022-01-31: 4.6 via INTRAVENOUS
  Filled 2022-01-31: qty 5

## 2022-01-31 MED ORDER — POTASSIUM IODIDE (ANTIDOTE) 130 MG PO TABS: 130.0000 mg | ORAL_TABLET | Freq: Once | ORAL | Status: DC

## 2022-02-01 DIAGNOSIS — G20A1 Parkinson's disease without dyskinesia, without mention of fluctuations: Secondary | ICD-10-CM | POA: Diagnosis not present

## 2022-02-06 ENCOUNTER — Telehealth: Payer: Self-pay

## 2022-02-06 NOTE — Telephone Encounter (Signed)
-----   Message from [name], MD sent at 02/06/2022  8:48 AM EDT ----- Please call patient and advise patient or son (on [place]) that the recent DaTscan was reported as normal.  This would argue against an underlying true Parkinson's-like disease or Parkinson's disease and favor the possibility of drug-induced tremors and parkinsonism as we have discussed. For now, we can monitor her clinically.  Please offer follow-up appointment in about 3 months.

## 2022-02-06 NOTE — Telephone Encounter (Signed)
I called patient to discuss. No answer, left a message asking patient to call us back.

## 2022-02-07 NOTE — Telephone Encounter (Signed)
Patient's son Linton Rump, per [facility] returned my call.  I discussed patient's DaTscan results.  Patient's son is agreeable to an appointment with Dr. Rexene Alberts.  An appointment was made for May 08, 2022 at 10:45 AM.  Patient's son will call us with interim questions or concerns.  Patient's son verbalized understanding of results.

## 2022-02-08 DIAGNOSIS — E538 Deficiency of other specified B group vitamins: Secondary | ICD-10-CM | POA: Diagnosis not present

## 2022-03-23 DIAGNOSIS — E538 Deficiency of other specified B group vitamins: Secondary | ICD-10-CM | POA: Diagnosis not present

## 2022-03-23 DIAGNOSIS — N1831 Chronic kidney disease, stage 3a: Secondary | ICD-10-CM | POA: Diagnosis not present

## 2022-03-23 DIAGNOSIS — G250 Essential tremor: Secondary | ICD-10-CM | POA: Diagnosis not present

## 2022-05-08 ENCOUNTER — Encounter: Payer: Self-pay | Admitting: Neurology

## 2022-05-08 ENCOUNTER — Ambulatory Visit (INDEPENDENT_AMBULATORY_CARE_PROVIDER_SITE_OTHER): Payer: Medicare HMO | Admitting: Neurology

## 2022-05-08 VITALS — BP 153/68 | HR 67 | Ht 60.0 in | Wt 139.8 lb

## 2022-05-08 DIAGNOSIS — G20C Parkinsonism, unspecified: Secondary | ICD-10-CM | POA: Diagnosis not present

## 2022-05-08 NOTE — Patient Instructions (Addendum)
Your exam looks stable. I am glad you continue to do well.  Use your walker for gait safety as needed.  We can consider low dose medication for mild parkinson's like features, but for now, let's continue to monitor your symptoms and exam. Follow up in 6 months.

## 2022-05-08 NOTE — Progress Notes (Signed)
Subjective:    Patient ID: Allison Watson is a 82 y.o. female.  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  The patient is accompanied by her son again today.  I first met her at the request of her primary care physician on 12/19/2021, at which time she reported an approximately 1 year history of difficulty with her balance and fine motor control, as well as tremors.  She had been in the hospital, tremors in late February 2023 for failure to thrive.  She had been on Seroquel in the past as well as Zyprexa but was no longer taking any of these medications.  She was also no longer on Depakote.  She was taking trazodone at night for sleep.  She had a DaTscan on 01/31/2022 and I reviewed the results: Examination showed some evidence of parkinsonism with some lateralization noted to the left.  We talked about potentially starting a symptomatic medication for parkinsonism.  I suggested we proceed with a DaTscan.  She had brain DaTscan on 01/31/2022 and I reviewed the results: IMPRESSION: Ioflupane scan within normal limits. No reduced radiotracer activity in basal ganglia to suggest Parkinson's syndrome pathology.   Of note, DaTSCAN is not diagnostic of Parkinsonian syndromes, which remains a clinical diagnosis. DaTscan is an adjuvant test to aid in the clinical diagnosis of Parkinsonian syndromes.  We notified them by phone call.  She was advised to return for a follow-up appointment for recheck.  Today, 05/08/2022: She reports feeling stable and doing well.  She feels that her right hand tremor has subsided.  She still has an intermittent left hand tremor.  She has not fallen, she tries to stay active and walks with her walker when she  goes outside.  She lives with her daughter and her family.  Her son reports no new issues and overall feels that she has done quite well especially compared to last year.  She tries to hydrate well with water and reports a good appetite.  She is not keen on starting any new medications at this time.  She is not sure if she still needs vitamin B12 injections.  She had a checkup with her primary care and vitamin B12 was tested at the time, in December 2023 but results are not available for my review today.  She had a normal blood and B12 level in December 2022, below normal in June 2022.  The patient's allergies, current medications, family history, past medical history, past social history, past surgical history and problem list were reviewed and updated as appropriate.   Previously:   12/19/21: (She) reports an approximately 1 year history of difficulty with her balance and fine motor control as well as tremors, primarily noticed in her right upper extremity.  I reviewed your office records.  She had an office visit on 10/31/2021.  She had blood work through your office on 09/21/2021 and I was able to review the results.  Lipid panel showed total cholesterol of 176, HDL 55, LDL 103, BUN 15 but creatinine elevated at 1.34, glucose elevated at 139.  A1c 5.3.  Otherwise normal BMP, liver function normal with alk phos 65, AST 12, ALT 8.  TSH normal at 1.98, total T4 normal at 5.7.  CBC with differential and platelets showed benign  findings, B12 on the lower end of the spectrum at 373.  Folate borderline low at 4.9.  Of note, she had recent hospitalization for failure to thrive.  She had also an admission last year for concern for brain hemorrhage.  She was hospitalized from 07/18/2021 through 06/23/2021 for failure to thrive and dysphagia.  She had also fallen about 3 days prior to admission.  She had a head CT without contrast on 06/20/2021 and I reviewed the results: Impression: Stable head CT, no acute intracranial  process. She had her cervical spine CT without contrast and head CT without contrast after a fall on 06/09/2021 and I reviewed the results: Impression:No evidence of acute intracranial abnormality. Small vessel ischemic changes.   No evidence of traumatic injury to the cervical spine. Mild degenerative changes. She had a CTU angiogram of the head with and without contrast on 05/30/2021 and I reviewed the results: IMPRESSION: 1. Negative CTA of the head. No aneurysm or other vascular malformation identified. 2. Mild atheromatous change about the carotid siphons without hemodynamically significant or correctable stenosis. She had a brain MRI with and without contrast on 05/17/2021 and I reviewed the results: Impression:No acute intracranial abnormality   Mild to moderate chronic microvascular ischemic change in the white matter. ADDENDUM: Comparison made with the MRI of 10/05/2020 revealing abnormality in the right parietal lobe. This abnormality has resolved and now appears normal. The sulcal FLAIR hyperintensity has resolved. Subarachnoid enhancement also has resolved. This may have been due to infection, hemorrhage, seizure disorder or other inflammatory process in the subarachnoid space.   Her son reports that she had a significant acute illness a year ago.  She was hospitalized in June 7741 with metabolic encephalopathy.  She had presented with hallucinations.  She had extensive work-up during that hospitalization.  She was felt to be B12 deficient and was started on supplementation.  She even had spinal fluid testing which was unremarkable.  There was some concern that she could have underlying seizures, she was started on Depakote.  She has not been on Depakote for the past 8 months per her son.  She has seen Dr. Merlene Laughter as an outpatient but no longer sees him.  She no longer takes any clonazepam.  She reports feeling a lot better overall.  Her son reports that she has improved quite a bit  but she has a tremor and difficulty with fine motor control, no recent falls, has not been using a walker but previously used a walker.  She has been on trazodone for sleep but does not believe it helps.  She has tried melatonin which caused her to be drowsy the next day and she had headaches.  She is no longer on Seroquel.  Apparently, she also was treated with Zyprexa in the past.  She feels that her tremor is worse in the right upper extremity.  She has no family history of Parkinson's disease.  She does not drink any alcohol, does drink a little bit more water but averages about 2 bottles of water per day.  She drinks caffeine in the form of soda, about 1 can/day on average.  She lives with her grandson.  She has 2 children, 1 son, 1 daughter.   Her Past Medical History Is Significant For: Past Medical History:  Diagnosis Date   Arthritis    Lt arm   Gout    Hypertension     Her Past Surgical History Is Significant For: Past Surgical History:  Procedure Laterality Date  ABDOMINAL HYSTERECTOMY     TUBAL LIGATION      Her Family History Is Significant For: Family History  Problem Relation Age of Onset   Hypertension Mother    Uterine cancer Mother    Alzheimer's disease Father    Breast cancer Sister    Parkinsonism Neg Hx     Her Social History Is Significant For: Social History   Socioeconomic History   Marital status: Legally Separated    Spouse name: Not on file   Number of children: Not on file   Years of education: Not on file   Highest education level: Not on file  Occupational History   Not on file  Tobacco Use   Smoking status: Former   Smokeless tobacco: Never  Vaping Use   Vaping Use: Never used  Substance and Sexual Activity   Alcohol use: No   Drug use: No   Sexual activity: Not on file  Other Topics Concern   Not on file  Social History Narrative   Caffeine tea/soda 1-2 can.    Social Determinants of Health   Financial Resource Strain: Not on  file  Food Insecurity: Not on file  Transportation Needs: Not on file  Physical Activity: Not on file  Stress: Not on file  Social Connections: Not on file    Her Allergies Are:  Allergies  Allergen Reactions   Codeine Rash   Lorazepam Other (See Comments)    Hallucinations per daughter Allison Watson   Penicillins Rash    Has patient had a PCN reaction causing immediate rash, facial/tongue/throat swelling, SOB or lightheadedness with hypotension: Yes Has patient had a PCN reaction causing severe rash involving mucus membranes or skin necrosis: No Has patient had a PCN reaction that required hospitalization No Has patient had a PCN reaction occurring within the last 10 years: No If all of the above answers are "NO", then may proceed with Cephalosporin use.   :   Her Current Medications Are:  Outpatient Encounter Medications as of 05/08/2022  Medication Sig   amLODipine (NORVASC) 5 MG tablet Take 5 mg by mouth daily.   cyanocobalamin (,VITAMIN B-12,) 1000 MCG/ML injection Inject 1,000 mcg into the skin every 30 (thirty) days.   folic acid (FOLVITE) 1 MG tablet Take 1 mg by mouth daily.   hydrALAZINE (APRESOLINE) 50 MG tablet Take 50 mg by mouth in the morning, at noon, and at bedtime.   hydrochlorothiazide (MICROZIDE) 12.5 MG capsule Take 12.5 mg by mouth daily.   losartan-hydrochlorothiazide (HYZAAR) 100-12.5 MG tablet Take 1 tablet by mouth daily.   meloxicam (MOBIC) 7.5 MG tablet Take 7.5 mg by mouth at bedtime.   traZODone (DESYREL) 100 MG tablet Take 100 mg by mouth at bedtime as needed. 30 minutes before bed.   divalproex (DEPAKOTE) 250 MG DR tablet Take 1 tablet (250 mg total) by mouth every 12 (twelve) hours. (Patient not taking: Reported on 12/19/2021)   Ensure Max Protein (ENSURE MAX PROTEIN) LIQD Take 330 mLs (11 oz total) by mouth daily.   Multiple Vitamin (MULTIVITAMIN) tablet Take 1 tablet by mouth daily.   No facility-administered encounter medications on file as of  05/08/2022.  :  Review of Systems:  Out of a complete 14 point review of systems, all are reviewed and negative with the exception of these symptoms as listed below:  Review of Systems  Neurological:        Pt here for Parkinsonism f/u  Pt has questions about B12 . PCP wants to  know if its necessary  for patient to continue  B12  Pt states tremors are better in right hand     Objective:  Neurological Exam  Physical Exam Physical Examination:   Vitals:   05/08/22 1054  BP: (!) 153/68  Pulse: 67    General Examination: The patient is a very pleasant 82 y.o. female in no acute distress. She appears well-developed and well-nourished and well groomed.   HEENT: Normocephalic, atraumatic, pupils are equal, round and reactive to light, extraocular tracking is good without limitation to gaze excursion or nystagmus noted. Corrective eyeglasses in place.  Hearing is grossly intact. Face is symmetric with normal facial animation, perhaps slight facial masking. Speech is clear with no dysarthria noted. There is no hypophonia. There is no lip, neck/head, jaw or voice tremor. Neck with mild nuchal rigidity but full range of passive and active motion, stable. There are no carotid bruits on auscultation. Oropharynx exam reveals: mild mouth dryness, adequate dental hygiene with nearly edentulous state on top and some missing teeth on the bottom.  Tongue protrudes centrally and palate elevates symmetrically, no abnormal involuntary movements noted in the tongue or face area.     Chest: Clear to auscultation without wheezing, rhonchi or crackles noted.   Heart: S1+S2+0, regular and normal without murmurs, rubs or gallops noted.    Abdomen: Soft, non-tender and non-distended.   Extremities: There is no pitting edema in the distal lower extremities bilaterally.    Skin: Warm and dry without trophic changes noted.    Musculoskeletal: exam reveals no obvious joint deformities.    Neurologically:   Mental status: The patient is awake, alert and oriented to situation and self, also able to provide fairly good concise history, details are provided by her son. Thought process is linear. Mood is normal and affect is normal.  Cranial nerves II - XII are as described above under HEENT exam.  Motor exam: Normal bulk, global strength of at least 4 out of 5, no focal weakness or atrophy.  She has a slight increase in tone in the left wrist area, otherwise normal tone.  She has a mild and intermittent resting tremor in the left upper extremity, no other resting tremor noted.  Romberg is not tested for safety concerns. Fine motor skills and coordination: She has mild difficulty with finger taps, hand movements and foot taps on the left side, fairly normal on the right side.    (On 12/19/2021: On Archimedes spiral drawing she has mild trembling with the left hand, no significant tremor with the right hand.  Handwriting with the right hand is legible, not tremulous, very neat, not particularly micrographic.)   She has no obv. postural tremor in both upper extremities.   Cerebellar testing: No dysmetria or intention tremor. There is no truncal or gait ataxia.  Sensory exam: intact to light touch in the upper and lower extremities.  Gait, station and balance: She stands without difficulty, needs no assistance.  Posture is age-appropriate.  She walks with a slight decrease in arm swing on the left, no obvious shuffling.  No walking aid.   Assessment and Plan:    In summary, Allison Watson is a very pleasant 82 year old female with an underlying medical history of arthritis, gout, hypertension, chronic kidney disease, hypertension, and borderline overweight state with recent weight loss and dysphagia, who presents for follow-up consultation of her tremor disorder, along with history of fine motor dyscontrol and balance issues as well as recurrent falls in  the past.  She had a significant decline in her  functioning in 2022.  She was on multiple psychotropic medications in the past.  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  She still has some evidence of mild parkinsonism with lateralization to the left.  Her DaTscan from October 2023 was reported as fairly normal but may have a slight abnormality on the right as per my interpretation.  Nevertheless, she has done quite well and remains stable, exam is stable to slightly improved even.  Clinically we can continue to monitor.  I did discuss the possibility of utilizing symptomatic medication for parkinsonism such as levodopa.  The patient and her son are not currently in favor of starting any new medication and I do believe that we can continue to monitoring her clinically.  She is reminded to use her walker for gait safety especially if she walks outside.  She is advised to continue to pursue a healthy lifestyle, good nutrition and good hydration with water.  We will plan a follow-up for routine checkup in about 6 months in this clinic, sooner if needed.  She is advised to follow-up with her primary care provider regarding her vitamin B12 and need for ongoing injections. I answered all their questions today and the patient and her son were in agreement with our plan.  I spent 30 minutes in total face-to-face time and in reviewing records during pre-charting, more than 50% of which was spent in counseling and coordination of care, reviewing test results, reviewing medications and treatment regimen and/or in discussing or reviewing the diagnosis of parkinsonism, the prognosis and treatment options. Pertinent laboratory and imaging test results that were available during this visit with the patient were reviewed by me and considered in my medical decision making (see chart for details).

## 2022-05-15 DIAGNOSIS — G250 Essential tremor: Secondary | ICD-10-CM | POA: Diagnosis not present

## 2022-05-15 DIAGNOSIS — N1831 Chronic kidney disease, stage 3a: Secondary | ICD-10-CM | POA: Diagnosis not present

## 2022-05-15 DIAGNOSIS — I10 Essential (primary) hypertension: Secondary | ICD-10-CM | POA: Diagnosis not present

## 2022-05-15 DIAGNOSIS — E538 Deficiency of other specified B group vitamins: Secondary | ICD-10-CM | POA: Diagnosis not present

## 2022-05-15 DIAGNOSIS — G4700 Insomnia, unspecified: Secondary | ICD-10-CM | POA: Diagnosis not present

## 2022-07-14 DIAGNOSIS — I10 Essential (primary) hypertension: Secondary | ICD-10-CM | POA: Diagnosis not present

## 2022-07-14 DIAGNOSIS — N1831 Chronic kidney disease, stage 3a: Secondary | ICD-10-CM | POA: Diagnosis not present

## 2022-07-15 ENCOUNTER — Encounter (HOSPITAL_COMMUNITY): Payer: Self-pay

## 2022-07-15 ENCOUNTER — Emergency Department (HOSPITAL_COMMUNITY): Payer: Medicare HMO

## 2022-07-15 ENCOUNTER — Emergency Department (HOSPITAL_COMMUNITY)
Admission: EM | Admit: 2022-07-15 | Discharge: 2022-07-15 | Disposition: A | Discharge status: Home / Self Care | Payer: Medicare HMO | Attending: Emergency Medicine | Admitting: Emergency Medicine

## 2022-07-15 ENCOUNTER — Other Ambulatory Visit: Payer: Self-pay

## 2022-07-15 DIAGNOSIS — I10 Essential (primary) hypertension: Secondary | ICD-10-CM | POA: Diagnosis not present

## 2022-07-15 DIAGNOSIS — Z87891 Personal history of nicotine dependence: Secondary | ICD-10-CM | POA: Diagnosis not present

## 2022-07-15 DIAGNOSIS — Z79899 Other long term (current) drug therapy: Secondary | ICD-10-CM | POA: Diagnosis not present

## 2022-07-15 DIAGNOSIS — M79671 Pain in right foot: Secondary | ICD-10-CM

## 2022-07-15 DIAGNOSIS — M064 Inflammatory polyarthropathy: Secondary | ICD-10-CM | POA: Insufficient documentation

## 2022-07-15 DIAGNOSIS — M1990 Unspecified osteoarthritis, unspecified site: Secondary | ICD-10-CM

## 2022-07-15 DIAGNOSIS — M25571 Pain in right ankle and joints of right foot: Secondary | ICD-10-CM | POA: Diagnosis not present

## 2022-07-15 DIAGNOSIS — M19071 Primary osteoarthritis, right ankle and foot: Secondary | ICD-10-CM | POA: Diagnosis not present

## 2022-07-15 MED ORDER — PREDNISONE 20 MG PO TABS
40.0000 mg | ORAL_TABLET | Freq: Once | ORAL | Status: AC
  Administered 2022-07-15: 40 mg via ORAL
  Filled 2022-07-15: qty 2

## 2022-07-15 MED ORDER — TRAMADOL HCL 50 MG PO TABS
50.0000 mg | ORAL_TABLET | Freq: Once | ORAL | Status: AC
  Administered 2022-07-15: 50 mg via ORAL
  Filled 2022-07-15: qty 1

## 2022-07-15 MED ORDER — TRAMADOL HCL 50 MG PO TABS: 50.0000 mg | ORAL_TABLET | Freq: Four times a day (QID) | ORAL | 0 refills | Status: DC | PRN

## 2022-07-15 MED ORDER — PREDNISONE 10 MG PO TABS: 40.0000 mg | ORAL_TABLET | Freq: Every day | ORAL | 0 refills | Status: DC

## 2022-07-15 NOTE — ED Provider Notes (Addendum)
[facility] Provider Note   CSN: [id number] Arrival date & time: 07/15/22  1522     History  Chief Complaint  Patient presents with   Foot Pain    Allison Watson is a 82 y.o. female.  Patient with a complaint of right ankle and right heel pain started last evening after walking some.  No known fall or injury.  Patient does relate that she has had difficulties with arthritis in the past.  Denies any wounds.  Patient has wheelchair at home that she uses when needed.  She has needed to use it with this foot pain because it was too painful to weight-bear.  Past medical history known for hypertension gout and arthritis.  Past surgical history significant for abdominal hysterectomy.  Patient is a former smoker.       Home Medications Prior to Admission medications   Medication Sig Start Date End Date Taking? Authorizing Provider  predniSONE (DELTASONE) 10 MG tablet Take 4 tablets (40 mg total) by mouth daily. 07/15/22  Yes Fredia Sorrow, MD  traMADol (ULTRAM) 50 MG tablet Take 1 tablet (50 mg total) by mouth every 6 (six) hours as needed. 07/15/22  Yes Idol, Almyra Free, PA-C  amLODipine (NORVASC) 5 MG tablet Take 5 mg by mouth daily. 10/31/21   [provider]  cyanocobalamin (,VITAMIN B-12,) 1000 MCG/ML injection Inject 1,000 mcg into the skin every 30 (thirty) days. 12/09/20   [provider]  divalproex (DEPAKOTE) 250 MG DR tablet Take 1 tablet (250 mg total) by mouth every 12 (twelve) hours. Patient not taking: Reported on 12/19/2021 03/31/21 06/20/21  Deatra James, MD  Ensure Max Protein (ENSURE MAX PROTEIN) LIQD Take 330 mLs (11 oz total) by mouth daily. 06/23/21   Johnson, Clanford L, MD  folic acid (FOLVITE) 1 MG tablet Take 1 mg by mouth daily. 03/06/22   [provider]  hydrALAZINE (APRESOLINE) 50 MG tablet Take 50 mg by mouth in the morning, at noon, and at bedtime. 10/09/21   [provider]   hydrochlorothiazide (MICROZIDE) 12.5 MG capsule Take 12.5 mg by mouth daily. 03/23/22   [provider]  losartan-hydrochlorothiazide (HYZAAR) 100-12.5 MG tablet Take 1 tablet by mouth daily. 10/31/21   [provider]  meloxicam (MOBIC) 7.5 MG tablet Take 7.5 mg by mouth at bedtime. 09/21/21   [provider]  Multiple Vitamin (MULTIVITAMIN) tablet Take 1 tablet by mouth daily.    [provider]  traZODone (DESYREL) 100 MG tablet Take 100 mg by mouth at bedtime as needed. 30 minutes before bed. 10/14/21   [provider]      Allergies    Codeine, Lorazepam, and Penicillins    Review of Systems   Review of Systems  Constitutional:  Negative for chills and fever.  HENT:  Negative for ear pain and sore throat.   Eyes:  Negative for pain and visual disturbance.  Respiratory:  Negative for cough and shortness of breath.   Cardiovascular:  Negative for chest pain and palpitations.  Gastrointestinal:  Negative for abdominal pain and vomiting.  Genitourinary:  Negative for dysuria and hematuria.  Musculoskeletal:  Positive for joint swelling. Negative for arthralgias and back pain.  Skin:  Negative for color change and rash.  Neurological:  Negative for seizures and syncope.  All other systems reviewed and are negative.   Physical Exam Updated Vital Signs BP (!) 112/59   Pulse 66   Temp (!) 97 F (36.1  C) (Temporal)   Resp 18   Ht 1.524 m (5')   Wt 63.5 kg   SpO2 100%   BMI 27.34 kg/m  Physical Exam Vitals and nursing note reviewed.  Constitutional:      General: She is not in acute distress.    Appearance: Normal appearance. She is well-developed. She is not ill-appearing.  HENT:     Head: Normocephalic and atraumatic.  Eyes:     Conjunctiva/sclera: Conjunctivae normal.  Cardiovascular:     Rate and Rhythm: Normal rate and regular rhythm.     Heart sounds: No murmur heard. Pulmonary:     Effort: Pulmonary effort is normal. No  respiratory distress.     Breath sounds: Normal breath sounds.  Abdominal:     Palpations: Abdomen is soft.     Tenderness: There is no abdominal tenderness.  Musculoskeletal:        General: Swelling and tenderness present. No deformity or signs of injury.     Cervical back: Neck supple.     Comments: Right foot ankle with some swelling to the lateral aspect of the ankle.  No erythema.  No swelling to the proximal or distal leg.  No fibula proximal tenderness.  Dorsalis pedis pulses 2+.  Good cap refill.  Also some tenderness to the posterior aspect of the heel.  And on the plantar surface.  But no evidence of any injury or wounds.  Skin:    General: Skin is warm and dry.     Capillary Refill: Capillary refill takes less than 2 seconds.  Neurological:     Mental Status: She is alert.  Psychiatric:        Mood and Affect: Mood normal.     ED Results / Procedures / Treatments   Labs (all labs ordered are listed, but only abnormal results are displayed) Labs Reviewed - No data to display  EKG None  Radiology DG Foot Complete Right  Result Date: 07/15/2022 CLINICAL DATA:  RIGHT ankle pain since last night EXAM: RIGHT FOOT COMPLETE - 3+ VIEW COMPARISON:  08/20/2010 FINDINGS: Bunion deformity first metatarsal head. Diffuse osseous demineralization. Joint spaces preserved. No acute fracture, dislocation, or bone destruction. Small plantar calcaneal spur. Presumed erosive change seen on ankle radiographs at medial talus/calcaneus not localized on foot radiographs. IMPRESSION: Calcaneal spurring. No definite acute foot abnormalities. Electronically Signed   By: Lavonia Dana M.D.   On: 07/15/2022 16:46   DG Ankle Complete Right  Result Date: 07/15/2022 CLINICAL DATA:  RIGHT ankle pain since last night, no injury EXAM: RIGHT ANKLE - COMPLETE 3+ VIEW COMPARISON:  None available FINDINGS: Osseous demineralization. Minimal narrowing of ankle joint medially. No acute fracture or dislocation. Small  plantar calcaneal spur. Cortical irregularity identified at the medial margin of the talus versus superimposed calcaneus, question erosions. IMPRESSION: No acute fracture or dislocation. Erosive changes are seen at the medial margin of talus versus calcaneus on the oblique view, not localized on remaining views, but suggesting presence of an inflammatory arthropathy at the hindfoot. Electronically Signed   By: Lavonia Dana M.D.   On: 07/15/2022 16:25    Procedures Procedures    Medications Ordered in ED Medications  predniSONE (DELTASONE) tablet 40 mg (has no administration in time range)  traMADol (ULTRAM) tablet 50 mg (50 mg Oral Given 07/15/22 1651)    ED Course/ Medical Decision Making/ A&P  Medical Decision Making Amount and/or Complexity of Data Reviewed Radiology: ordered.  Risk Prescription drug management.   Symptoms suggestive of inflammatory arthritis or may be gout.  Will get x-rays to rule out any kind of injury.  X-ray of foot and ankle suggestive of an inflammatory arthropathy.  This could be gout or inflammatory arthritis.  Will treat with prednisone and tramadol.   Final Clinical Impression(s) / ED Diagnoses Final diagnoses:  Foot pain, right  Inflammatory arthritis    Rx / DC Orders ED Discharge Orders          Ordered    predniSONE (DELTASONE) 10 MG tablet  Daily        07/15/22 1731    traMADol (ULTRAM) 50 MG tablet  Every 6 hours PRN        07/15/22 1738              Fredia Sorrow, MD 07/15/22 1614    Fredia Sorrow, MD 07/15/22 1739

## 2022-07-15 NOTE — Discharge Instructions (Addendum)
Take the prednisone as directed for the next 5 days.  First dose will be tomorrow.  You had your dose for today.  Take tramadol as needed for pain.  Can also take extra strength Tylenol as well.  Clinically this seems to be consistent with an inflammatory arthritis or gout.  Would expect improvement over the next few days.  Make an appointment to follow-up with your doctor.  Return for any new or worse symptoms.

## 2022-07-15 NOTE — ED Triage Notes (Signed)
Pt states she has had R ankle pain since last night. Denies injury. Difficulty when ambulating. No OTC medications PTA. Denies any past hx with this foot.   Pt in wheel chair in triage.

## 2022-07-21 ENCOUNTER — Telehealth: Payer: Self-pay | Admitting: *Deleted

## 2022-07-21 NOTE — Telephone Encounter (Signed)
        Patient  visited [facility] ED on 07/15/2022  for treatment    Telephone encounter attempt :  1ST  A HIPAA compliant voice message was left requesting a return call.  Instructed patient to call back at [phone].  [place] [phone] [address] , [place] [address] Email : [email or web address]

## 2022-07-26 ENCOUNTER — Telehealth: Payer: Self-pay | Admitting: *Deleted

## 2022-07-26 NOTE — Telephone Encounter (Signed)
        Patient  visited [facility] on 07/15/2022  for treatment    Telephone encounter attempt :  2nd  No answer   [place] [phone] [address] , [place] [address] Email : [email or web address]

## 2022-08-14 DIAGNOSIS — N1831 Chronic kidney disease, stage 3a: Secondary | ICD-10-CM | POA: Diagnosis not present

## 2022-08-14 DIAGNOSIS — I10 Essential (primary) hypertension: Secondary | ICD-10-CM | POA: Diagnosis not present

## 2022-09-13 DIAGNOSIS — N1831 Chronic kidney disease, stage 3a: Secondary | ICD-10-CM | POA: Diagnosis not present

## 2022-09-13 DIAGNOSIS — I10 Essential (primary) hypertension: Secondary | ICD-10-CM | POA: Diagnosis not present

## 2022-10-09 DIAGNOSIS — G479 Sleep disorder, unspecified: Secondary | ICD-10-CM | POA: Diagnosis not present

## 2022-10-09 DIAGNOSIS — I10 Essential (primary) hypertension: Secondary | ICD-10-CM | POA: Diagnosis not present

## 2022-10-09 DIAGNOSIS — R7303 Prediabetes: Secondary | ICD-10-CM | POA: Diagnosis not present

## 2022-10-09 DIAGNOSIS — E538 Deficiency of other specified B group vitamins: Secondary | ICD-10-CM | POA: Diagnosis not present

## 2022-10-09 DIAGNOSIS — G250 Essential tremor: Secondary | ICD-10-CM | POA: Diagnosis not present

## 2022-10-09 DIAGNOSIS — E559 Vitamin D deficiency, unspecified: Secondary | ICD-10-CM | POA: Diagnosis not present

## 2022-10-09 DIAGNOSIS — G2401 Drug induced subacute dyskinesia: Secondary | ICD-10-CM | POA: Diagnosis not present

## 2022-10-09 DIAGNOSIS — R7309 Other abnormal glucose: Secondary | ICD-10-CM | POA: Diagnosis not present

## 2022-10-09 DIAGNOSIS — R7301 Impaired fasting glucose: Secondary | ICD-10-CM | POA: Diagnosis not present

## 2022-10-13 DIAGNOSIS — G4700 Insomnia, unspecified: Secondary | ICD-10-CM | POA: Diagnosis not present

## 2022-10-13 DIAGNOSIS — Z1389 Encounter for screening for other disorder: Secondary | ICD-10-CM | POA: Diagnosis not present

## 2022-10-13 DIAGNOSIS — E538 Deficiency of other specified B group vitamins: Secondary | ICD-10-CM | POA: Diagnosis not present

## 2022-10-13 DIAGNOSIS — G250 Essential tremor: Secondary | ICD-10-CM | POA: Diagnosis not present

## 2022-10-13 DIAGNOSIS — I10 Essential (primary) hypertension: Secondary | ICD-10-CM | POA: Diagnosis not present

## 2022-10-13 DIAGNOSIS — Z0001 Encounter for general adult medical examination with abnormal findings: Secondary | ICD-10-CM | POA: Diagnosis not present

## 2022-10-13 DIAGNOSIS — R7303 Prediabetes: Secondary | ICD-10-CM | POA: Diagnosis not present

## 2022-10-13 DIAGNOSIS — Z1331 Encounter for screening for depression: Secondary | ICD-10-CM | POA: Diagnosis not present

## 2022-10-13 DIAGNOSIS — N184 Chronic kidney disease, stage 4 (severe): Secondary | ICD-10-CM | POA: Diagnosis not present

## 2022-11-01 ENCOUNTER — Emergency Department (HOSPITAL_COMMUNITY): Payer: Medicare HMO

## 2022-11-01 ENCOUNTER — Inpatient Hospital Stay (HOSPITAL_COMMUNITY)
Admission: EM | Admit: 2022-11-01 | Discharge: 2022-11-04 | DRG: 392 | Disposition: A | Discharge status: Home / Self Care | Payer: Medicare HMO | Attending: Family Medicine | Admitting: Family Medicine

## 2022-11-01 ENCOUNTER — Other Ambulatory Visit: Payer: Self-pay

## 2022-11-01 DIAGNOSIS — M109 Gout, unspecified: Secondary | ICD-10-CM | POA: Diagnosis present

## 2022-11-01 DIAGNOSIS — M069 Rheumatoid arthritis, unspecified: Secondary | ICD-10-CM | POA: Diagnosis not present

## 2022-11-01 DIAGNOSIS — Z885 Allergy status to narcotic agent status: Secondary | ICD-10-CM

## 2022-11-01 DIAGNOSIS — E876 Hypokalemia: Secondary | ICD-10-CM | POA: Diagnosis present

## 2022-11-01 DIAGNOSIS — R109 Unspecified abdominal pain: Secondary | ICD-10-CM | POA: Insufficient documentation

## 2022-11-01 DIAGNOSIS — N1832 Chronic kidney disease, stage 3b: Secondary | ICD-10-CM | POA: Diagnosis not present

## 2022-11-01 DIAGNOSIS — Z8049 Family history of malignant neoplasm of other genital organs: Secondary | ICD-10-CM

## 2022-11-01 DIAGNOSIS — E440 Moderate protein-calorie malnutrition: Secondary | ICD-10-CM | POA: Diagnosis present

## 2022-11-01 DIAGNOSIS — R531 Weakness: Secondary | ICD-10-CM | POA: Diagnosis not present

## 2022-11-01 DIAGNOSIS — M1990 Unspecified osteoarthritis, unspecified site: Secondary | ICD-10-CM | POA: Diagnosis present

## 2022-11-01 DIAGNOSIS — N179 Acute kidney failure, unspecified: Secondary | ICD-10-CM | POA: Diagnosis not present

## 2022-11-01 DIAGNOSIS — Z79899 Other long term (current) drug therapy: Secondary | ICD-10-CM | POA: Diagnosis not present

## 2022-11-01 DIAGNOSIS — Z803 Family history of malignant neoplasm of breast: Secondary | ICD-10-CM

## 2022-11-01 DIAGNOSIS — E872 Acidosis, unspecified: Secondary | ICD-10-CM | POA: Diagnosis present

## 2022-11-01 DIAGNOSIS — Z791 Long term (current) use of non-steroidal anti-inflammatories (NSAID): Secondary | ICD-10-CM

## 2022-11-01 DIAGNOSIS — Z820 Family history of epilepsy and other diseases of the nervous system: Secondary | ICD-10-CM | POA: Diagnosis not present

## 2022-11-01 DIAGNOSIS — R112 Nausea with vomiting, unspecified: Secondary | ICD-10-CM | POA: Insufficient documentation

## 2022-11-01 DIAGNOSIS — I129 Hypertensive chronic kidney disease with stage 1 through stage 4 chronic kidney disease, or unspecified chronic kidney disease: Secondary | ICD-10-CM | POA: Diagnosis present

## 2022-11-01 DIAGNOSIS — E8809 Other disorders of plasma-protein metabolism, not elsewhere classified: Secondary | ICD-10-CM | POA: Insufficient documentation

## 2022-11-01 DIAGNOSIS — N189 Chronic kidney disease, unspecified: Secondary | ICD-10-CM | POA: Diagnosis not present

## 2022-11-01 DIAGNOSIS — Z635 Disruption of family by separation and divorce: Secondary | ICD-10-CM

## 2022-11-01 DIAGNOSIS — Z87891 Personal history of nicotine dependence: Secondary | ICD-10-CM | POA: Diagnosis not present

## 2022-11-01 DIAGNOSIS — Z7952 Long term (current) use of systemic steroids: Secondary | ICD-10-CM | POA: Diagnosis not present

## 2022-11-01 DIAGNOSIS — E46 Unspecified protein-calorie malnutrition: Secondary | ICD-10-CM | POA: Diagnosis not present

## 2022-11-01 DIAGNOSIS — R1030 Lower abdominal pain, unspecified: Secondary | ICD-10-CM | POA: Diagnosis not present

## 2022-11-01 DIAGNOSIS — Z8249 Family history of ischemic heart disease and other diseases of the circulatory system: Secondary | ICD-10-CM

## 2022-11-01 DIAGNOSIS — I10 Essential (primary) hypertension: Secondary | ICD-10-CM | POA: Diagnosis not present

## 2022-11-01 DIAGNOSIS — Z6825 Body mass index (BMI) 25.0-25.9, adult: Secondary | ICD-10-CM | POA: Diagnosis not present

## 2022-11-01 DIAGNOSIS — Z880 Allergy status to penicillin: Secondary | ICD-10-CM | POA: Diagnosis not present

## 2022-11-01 DIAGNOSIS — Z888 Allergy status to other drugs, medicaments and biological substances status: Secondary | ICD-10-CM | POA: Diagnosis not present

## 2022-11-01 DIAGNOSIS — I700 Atherosclerosis of aorta: Secondary | ICD-10-CM | POA: Diagnosis not present

## 2022-11-01 DIAGNOSIS — R55 Syncope and collapse: Secondary | ICD-10-CM | POA: Diagnosis not present

## 2022-11-01 DIAGNOSIS — K529 Noninfective gastroenteritis and colitis, unspecified: Secondary | ICD-10-CM | POA: Diagnosis not present

## 2022-11-01 DIAGNOSIS — K449 Diaphragmatic hernia without obstruction or gangrene: Secondary | ICD-10-CM | POA: Diagnosis not present

## 2022-11-01 LAB — COMPREHENSIVE METABOLIC PANEL
ALT: 13 U/L (ref 0–44)
AST: 14 U/L — ABNORMAL LOW (ref 15–41)
Albumin: 3 g/dL — ABNORMAL LOW (ref 3.5–5.0)
Alkaline Phosphatase: 62 U/L (ref 38–126)
Anion gap: 15 (ref 5–15)
BUN: 54 mg/dL — ABNORMAL HIGH (ref 8–23)
CO2: 21 mmol/L — ABNORMAL LOW (ref 22–32)
Calcium: 9.3 mg/dL (ref 8.9–10.3)
Chloride: 99 mmol/L (ref 98–111)
Creatinine, Ser: 3.34 mg/dL — ABNORMAL HIGH (ref 0.44–1.00)
GFR, Estimated: 13 mL/min — ABNORMAL LOW (ref >60)
Glucose, Bld: 114 mg/dL — ABNORMAL HIGH (ref 70–99)
Potassium: 3.6 mmol/L (ref 3.5–5.1)
Sodium: 135 mmol/L (ref 135–145)
Total Bilirubin: 1 mg/dL (ref 0.3–1.2)
Total Protein: 7.1 g/dL (ref 6.5–8.1)

## 2022-11-01 LAB — LIPASE, BLOOD: Lipase: 39 U/L (ref 11–51)

## 2022-11-01 LAB — CBC
HCT: 36.6 % (ref 36.0–46.0)
Hemoglobin: 12.4 g/dL (ref 12.0–15.0)
MCH: 27.9 pg (ref 26.0–34.0)
MCHC: 33.9 g/dL (ref 30.0–36.0)
MCV: 82.4 fL (ref 80.0–100.0)
Platelets: 279 10*3/uL (ref 150–400)
RBC: 4.44 MIL/uL (ref 3.87–5.11)
RDW: 14.3 % (ref 11.5–15.5)
WBC: 8.1 10*3/uL (ref 4.0–10.5)
nRBC: 0 % (ref 0.0–0.2)

## 2022-11-01 LAB — MAGNESIUM: Magnesium: 2.1 mg/dL (ref 1.7–2.4)

## 2022-11-01 MED ORDER — LACTATED RINGERS IV BOLUS
1000.0000 mL | Freq: Once | INTRAVENOUS | Status: AC
  Administered 2022-11-01: 1000 mL via INTRAVENOUS

## 2022-11-01 MED ORDER — ONDANSETRON HCL 4 MG/2ML IJ SOLN
4.0000 mg | Freq: Once | INTRAMUSCULAR | Status: AC
  Administered 2022-11-01: 4 mg via INTRAVENOUS
  Filled 2022-11-01: qty 2

## 2022-11-01 NOTE — ED Provider Notes (Signed)
AP-EMERGENCY DEPT [facility] Emergency Department Provider Note MRN:  [id number]  Arrival date & time: 11/02/22     Chief Complaint   Weakness History of Present Illness   Allison Watson is a 82 y.o. year-old female with a history of hypertension presenting to the ED with chief complaint of weakness.  For the past 3 days patient has been having nausea vomiting and diarrhea, she is feeling weak.  She is having lower abdominal pain.  She says she can barely stand up.  Denies fever, no chest pain.  Review of Systems  A thorough review of systems was obtained and all systems are negative except as noted in the HPI and PMH.   Patient's Health History    Past Medical History:  Diagnosis Date   Arthritis    Lt arm   Gout    Hypertension     Past Surgical History:  Procedure Laterality Date   ABDOMINAL HYSTERECTOMY     TUBAL LIGATION      Family History  Problem Relation Age of Onset   Hypertension Mother    Uterine cancer Mother    Alzheimer's disease Father    Breast cancer Sister    Parkinsonism Neg Hx     Social History   Socioeconomic History   Marital status: Legally Separated    Spouse name: Not on file   Number of children: Not on file   Years of education: Not on file   Highest education level: Not on file  Occupational History   Not on file  Tobacco Use   Smoking status: Former   Smokeless tobacco: Never  Vaping Use   Vaping status: Never Used  Substance and Sexual Activity   Alcohol use: No   Drug use: No   Sexual activity: Not on file  Other Topics Concern   Not on file  Social History Narrative   Caffeine tea/soda 1-2 can.    Social Determinants of Health   Financial Resource Strain: Not on file  Food Insecurity: Not on file  Transportation Needs: Not on file  Physical Activity: Not on file  Stress: Not on file  Social Connections: Not on file  Intimate Partner Violence: Not on file     Physical Exam   Vitals:   11/01/22 2249   BP: (!) 162/82  Pulse: 71  Resp: 16  Temp: 97.9 F (36.6 C)  SpO2: 100%    CONSTITUTIONAL: Chronically ill-appearing, NAD NEURO/PSYCH:  Alert and oriented x 3, no focal deficits EYES:  eyes equal and reactive ENT/NECK:  no LAD, no JVD CARDIO: Regular rate, well-perfused, normal S1 and S2 PULM:  CTAB no wheezing or rhonchi GI/GU:  non-distended, non-tender MSK/SPINE:  No gross deformities, no edema SKIN:  no rash, atraumatic   *Additional and/or pertinent findings included in MDM below  Diagnostic and Interventional Summary    EKG Interpretation Date/Time:  Wednesday November 01 2022 22:54:11 EDT Ventricular Rate:  71 PR Interval:  162 QRS Duration:  85 QT Interval:  383 QTC Calculation: 417 R Axis:   13  Text Interpretation: Sinus rhythm Confirmed by Kennis Carina [phone]) on 11/02/2022 2:29:18 AM       Labs Reviewed  COMPREHENSIVE METABOLIC PANEL - Abnormal; Notable for the following components:      Result Value   CO2 21 (*)    Glucose, Bld 114 (*)    BUN 54 (*)    Creatinine, Ser 3.34 (*)    Albumin 3.0 (*)    AST  14 (*)    GFR, Estimated 13 (*)    All other components within normal limits  CBC  LIPASE, BLOOD  MAGNESIUM  URINALYSIS, ROUTINE W REFLEX MICROSCOPIC    CT ABDOMEN PELVIS WO CONTRAST  Final Result    DG Chest Port 1 View  Final Result      Medications  lactated ringers bolus 1,000 mL (1,000 mLs Intravenous New Bag/Given 11/01/22 2331)  ondansetron (ZOFRAN) injection 4 mg (4 mg Intravenous Given 11/01/22 2329)     Procedures  /  Critical Care Procedures  ED Course and Medical Decision Making  Initial Impression and Ddx Differential diagnosis includes gastroenteritis, appendicitis, diverticulitis, perforated viscus, dehydration, AKI  Past medical/surgical history that increases complexity of ED encounter: None  Interpretation of Diagnostics I personally reviewed the EKG and my interpretation is as follows: Sinus rhythm  Labs reveal  acute kidney injury, mild acidosis, no leukocytosis.  Patient Reassessment and Ultimate Disposition/Management     CT imaging reveals a colitis.  Given this and patient's continued nausea, acute kidney injury will admit to medicine.  Patient management required discussion with the following services or consulting groups:  Hospitalist Service  Complexity of Problems Addressed Acute illness or injury that poses threat of life of bodily function  Additional Data Reviewed and Analyzed Further history obtained from: Further history from spouse/family member  Additional Factors Impacting ED Encounter Risk Consideration of hospitalization  Elmer Sow. Pilar Plate, MD [facility] Emergency Medicine [facility] [email or web address]  Final Clinical Impressions(s) / ED Diagnoses     ICD-10-CM   1. Colitis  K52.9     2. AKI (acute kidney injury) (HCC)  N17.9       ED Discharge Orders     None        Discharge Instructions Discussed with and Provided to Patient:   Discharge Instructions   None      Sabas Sous, MD 11/02/22 0230

## 2022-11-01 NOTE — ED Triage Notes (Addendum)
Arrives from home via EMS. Weakness, poor PO intake, lower abd pain, emesis x 3days. EMS v/s124/74, 100% RA, 82bpm, 113CBG H/o CKD, recommended for HD but not currently receiving.

## 2022-11-02 DIAGNOSIS — M1990 Unspecified osteoarthritis, unspecified site: Secondary | ICD-10-CM | POA: Diagnosis present

## 2022-11-02 DIAGNOSIS — Z8249 Family history of ischemic heart disease and other diseases of the circulatory system: Secondary | ICD-10-CM | POA: Diagnosis not present

## 2022-11-02 DIAGNOSIS — Z888 Allergy status to other drugs, medicaments and biological substances status: Secondary | ICD-10-CM | POA: Diagnosis not present

## 2022-11-02 DIAGNOSIS — N179 Acute kidney failure, unspecified: Secondary | ICD-10-CM | POA: Diagnosis present

## 2022-11-02 DIAGNOSIS — Z87891 Personal history of nicotine dependence: Secondary | ICD-10-CM | POA: Diagnosis not present

## 2022-11-02 DIAGNOSIS — Z803 Family history of malignant neoplasm of breast: Secondary | ICD-10-CM | POA: Diagnosis not present

## 2022-11-02 DIAGNOSIS — Z8049 Family history of malignant neoplasm of other genital organs: Secondary | ICD-10-CM | POA: Diagnosis not present

## 2022-11-02 DIAGNOSIS — I10 Essential (primary) hypertension: Secondary | ICD-10-CM

## 2022-11-02 DIAGNOSIS — N1832 Chronic kidney disease, stage 3b: Secondary | ICD-10-CM | POA: Diagnosis present

## 2022-11-02 DIAGNOSIS — K529 Noninfective gastroenteritis and colitis, unspecified: Secondary | ICD-10-CM | POA: Diagnosis present

## 2022-11-02 DIAGNOSIS — I129 Hypertensive chronic kidney disease with stage 1 through stage 4 chronic kidney disease, or unspecified chronic kidney disease: Secondary | ICD-10-CM | POA: Diagnosis present

## 2022-11-02 DIAGNOSIS — Z885 Allergy status to narcotic agent status: Secondary | ICD-10-CM | POA: Diagnosis not present

## 2022-11-02 DIAGNOSIS — E8809 Other disorders of plasma-protein metabolism, not elsewhere classified: Secondary | ICD-10-CM

## 2022-11-02 DIAGNOSIS — Z820 Family history of epilepsy and other diseases of the nervous system: Secondary | ICD-10-CM | POA: Diagnosis not present

## 2022-11-02 DIAGNOSIS — Z880 Allergy status to penicillin: Secondary | ICD-10-CM | POA: Diagnosis not present

## 2022-11-02 DIAGNOSIS — E872 Acidosis, unspecified: Secondary | ICD-10-CM | POA: Diagnosis present

## 2022-11-02 DIAGNOSIS — Z7952 Long term (current) use of systemic steroids: Secondary | ICD-10-CM | POA: Diagnosis not present

## 2022-11-02 DIAGNOSIS — R112 Nausea with vomiting, unspecified: Secondary | ICD-10-CM | POA: Diagnosis not present

## 2022-11-02 DIAGNOSIS — Z79899 Other long term (current) drug therapy: Secondary | ICD-10-CM | POA: Diagnosis not present

## 2022-11-02 DIAGNOSIS — Z635 Disruption of family by separation and divorce: Secondary | ICD-10-CM | POA: Diagnosis not present

## 2022-11-02 DIAGNOSIS — Z6825 Body mass index (BMI) 25.0-25.9, adult: Secondary | ICD-10-CM | POA: Diagnosis not present

## 2022-11-02 DIAGNOSIS — R109 Unspecified abdominal pain: Secondary | ICD-10-CM | POA: Insufficient documentation

## 2022-11-02 DIAGNOSIS — E440 Moderate protein-calorie malnutrition: Secondary | ICD-10-CM | POA: Diagnosis present

## 2022-11-02 DIAGNOSIS — N189 Chronic kidney disease, unspecified: Secondary | ICD-10-CM | POA: Insufficient documentation

## 2022-11-02 DIAGNOSIS — R1030 Lower abdominal pain, unspecified: Secondary | ICD-10-CM | POA: Diagnosis not present

## 2022-11-02 DIAGNOSIS — M069 Rheumatoid arthritis, unspecified: Secondary | ICD-10-CM | POA: Diagnosis present

## 2022-11-02 DIAGNOSIS — E876 Hypokalemia: Secondary | ICD-10-CM | POA: Diagnosis present

## 2022-11-02 DIAGNOSIS — E46 Unspecified protein-calorie malnutrition: Secondary | ICD-10-CM

## 2022-11-02 DIAGNOSIS — M109 Gout, unspecified: Secondary | ICD-10-CM | POA: Diagnosis present

## 2022-11-02 DIAGNOSIS — Z791 Long term (current) use of non-steroidal anti-inflammatories (NSAID): Secondary | ICD-10-CM | POA: Diagnosis not present

## 2022-11-02 LAB — URINALYSIS, ROUTINE W REFLEX MICROSCOPIC
Bilirubin Urine: NEGATIVE
Glucose, UA: NEGATIVE mg/dL
Ketones, ur: 5 mg/dL — AB
Nitrite: POSITIVE — AB
Protein, ur: NEGATIVE mg/dL
Specific Gravity, Urine: 1.012 (ref 1.005–1.030)
pH: 5 (ref 5.0–8.0)

## 2022-11-02 LAB — COMPREHENSIVE METABOLIC PANEL
ALT: 12 U/L (ref 0–44)
AST: 12 U/L — ABNORMAL LOW (ref 15–41)
Albumin: 2.6 g/dL — ABNORMAL LOW (ref 3.5–5.0)
Alkaline Phosphatase: 54 U/L (ref 38–126)
Anion gap: 11 (ref 5–15)
BUN: 51 mg/dL — ABNORMAL HIGH (ref 8–23)
CO2: 24 mmol/L (ref 22–32)
Calcium: 8.9 mg/dL (ref 8.9–10.3)
Chloride: 100 mmol/L (ref 98–111)
Creatinine, Ser: 2.8 mg/dL — ABNORMAL HIGH (ref 0.44–1.00)
GFR, Estimated: 17 mL/min — ABNORMAL LOW (ref >60)
Glucose, Bld: 99 mg/dL (ref 70–99)
Potassium: 3.3 mmol/L — ABNORMAL LOW (ref 3.5–5.1)
Sodium: 135 mmol/L (ref 135–145)
Total Bilirubin: 0.8 mg/dL (ref 0.3–1.2)
Total Protein: 6.1 g/dL — ABNORMAL LOW (ref 6.5–8.1)

## 2022-11-02 LAB — CBC
HCT: 33.8 % — ABNORMAL LOW (ref 36.0–46.0)
Hemoglobin: 11.2 g/dL — ABNORMAL LOW (ref 12.0–15.0)
MCH: 28 pg (ref 26.0–34.0)
MCHC: 33.1 g/dL (ref 30.0–36.0)
MCV: 84.5 fL (ref 80.0–100.0)
Platelets: 236 10*3/uL (ref 150–400)
RBC: 4 MIL/uL (ref 3.87–5.11)
RDW: 14.5 % (ref 11.5–15.5)
WBC: 7 10*3/uL (ref 4.0–10.5)
nRBC: 0 % (ref 0.0–0.2)

## 2022-11-02 LAB — PHOSPHORUS: Phosphorus: 2.8 mg/dL (ref 2.5–4.6)

## 2022-11-02 MED ORDER — LACTATED RINGERS IV BOLUS
1000.0000 mL | Freq: Once | INTRAVENOUS | Status: AC
  Administered 2022-11-02: 1000 mL via INTRAVENOUS

## 2022-11-02 MED ORDER — VITAMIN B-12 1000 MCG PO TABS
1000.0000 ug | ORAL_TABLET | Freq: Every day | ORAL | Status: DC
  Administered 2022-11-02 – 2022-11-04 (×3): 1000 ug via ORAL
  Filled 2022-11-02 (×3): qty 1

## 2022-11-02 MED ORDER — HYDRALAZINE HCL 20 MG/ML IJ SOLN: 10.0000 mg | Freq: Four times a day (QID) | INTRAMUSCULAR | Status: DC | PRN

## 2022-11-02 MED ORDER — AMLODIPINE BESYLATE 5 MG PO TABS
2.5000 mg | ORAL_TABLET | Freq: Every day | ORAL | Status: DC
  Administered 2022-11-02: 2.5 mg via ORAL
  Filled 2022-11-02 (×2): qty 1

## 2022-11-02 MED ORDER — OXYCODONE HCL 5 MG PO TABS
5.0000 mg | ORAL_TABLET | ORAL | Status: DC | PRN
  Administered 2022-11-03: 5 mg via ORAL
  Filled 2022-11-02 (×2): qty 1

## 2022-11-02 MED ORDER — HYDROMORPHONE HCL 1 MG/ML IJ SOLN: 0.5000 mg | INTRAMUSCULAR | Status: DC | PRN

## 2022-11-02 MED ORDER — ACETAMINOPHEN 650 MG RE SUPP: 650.0000 mg | Freq: Four times a day (QID) | RECTAL | Status: DC | PRN

## 2022-11-02 MED ORDER — ENSURE ENLIVE PO LIQD
237.0000 mL | Freq: Two times a day (BID) | ORAL | Status: DC
  Filled 2022-11-02 (×4): qty 237

## 2022-11-02 MED ORDER — SODIUM CHLORIDE 0.9 % IV SOLN: INTRAVENOUS | Status: DC

## 2022-11-02 MED ORDER — ONDANSETRON HCL 4 MG/2ML IJ SOLN: 4.0000 mg | Freq: Four times a day (QID) | INTRAMUSCULAR | Status: DC | PRN

## 2022-11-02 MED ORDER — LOSARTAN POTASSIUM 50 MG PO TABS
100.0000 mg | ORAL_TABLET | Freq: Every day | ORAL | Status: DC
  Administered 2022-11-03 – 2022-11-04 (×2): 100 mg via ORAL
  Filled 2022-11-02 (×2): qty 2

## 2022-11-02 MED ORDER — SODIUM CHLORIDE 0.9 % IV SOLN
2.0000 g | Freq: Once | INTRAVENOUS | Status: AC
  Administered 2022-11-02: 2 g via INTRAVENOUS

## 2022-11-02 MED ORDER — HEPARIN SODIUM (PORCINE) 5000 UNIT/ML IJ SOLN
5000.0000 [IU] | Freq: Three times a day (TID) | INTRAMUSCULAR | Status: DC
  Administered 2022-11-02 – 2022-11-04 (×7): 5000 [IU] via SUBCUTANEOUS
  Filled 2022-11-02 (×7): qty 1

## 2022-11-02 MED ORDER — ONDANSETRON HCL 4 MG/2ML IJ SOLN: 4.0000 mg | Freq: Once | INTRAMUSCULAR | Status: DC

## 2022-11-02 MED ORDER — MELATONIN 3 MG PO TABS
6.0000 mg | ORAL_TABLET | Freq: Once | ORAL | Status: AC
  Administered 2022-11-02: 6 mg via ORAL
  Filled 2022-11-02: qty 2

## 2022-11-02 MED ORDER — ACETAMINOPHEN 325 MG PO TABS
650.0000 mg | ORAL_TABLET | Freq: Four times a day (QID) | ORAL | Status: DC | PRN
  Administered 2022-11-03: 650 mg via ORAL
  Filled 2022-11-02: qty 2

## 2022-11-02 MED ORDER — METRONIDAZOLE 500 MG/100ML IV SOLN
500.0000 mg | Freq: Once | INTRAVENOUS | Status: AC
  Administered 2022-11-02: 500 mg via INTRAVENOUS
  Filled 2022-11-02: qty 100

## 2022-11-02 MED ORDER — HYDRALAZINE HCL 25 MG PO TABS
50.0000 mg | ORAL_TABLET | Freq: Three times a day (TID) | ORAL | Status: DC
  Administered 2022-11-03 – 2022-11-04 (×6): 50 mg via ORAL
  Filled 2022-11-02 (×6): qty 2

## 2022-11-02 MED ORDER — DICLOFENAC SODIUM 1 % EX GEL
2.0000 g | Freq: Four times a day (QID) | CUTANEOUS | Status: DC
  Filled 2022-11-02: qty 100

## 2022-11-02 MED ORDER — AMLODIPINE BESYLATE 5 MG PO TABS
5.0000 mg | ORAL_TABLET | Freq: Every day | ORAL | Status: DC
  Administered 2022-11-03 – 2022-11-04 (×2): 5 mg via ORAL
  Filled 2022-11-02 (×2): qty 1

## 2022-11-02 MED ORDER — FOLIC ACID 1 MG PO TABS
1.0000 mg | ORAL_TABLET | Freq: Every day | ORAL | Status: DC
  Administered 2022-11-02 – 2022-11-04 (×3): 1 mg via ORAL
  Filled 2022-11-02 (×3): qty 1

## 2022-11-02 MED ORDER — METRONIDAZOLE 500 MG/100ML IV SOLN
500.0000 mg | Freq: Two times a day (BID) | INTRAVENOUS | Status: DC
  Administered 2022-11-02 – 2022-11-04 (×5): 500 mg via INTRAVENOUS
  Filled 2022-11-02 (×5): qty 100

## 2022-11-02 MED ORDER — ONDANSETRON HCL 4 MG PO TABS: 4.0000 mg | ORAL_TABLET | Freq: Four times a day (QID) | ORAL | Status: DC | PRN

## 2022-11-02 MED ORDER — SODIUM CHLORIDE 0.9 % IV SOLN
2.0000 g | INTRAVENOUS | Status: DC
  Administered 2022-11-03 – 2022-11-04 (×2): 2 g via INTRAVENOUS
  Filled 2022-11-02 (×2): qty 20

## 2022-11-02 MED ORDER — TRAZODONE HCL 50 MG PO TABS
100.0000 mg | ORAL_TABLET | Freq: Every day | ORAL | Status: DC
  Administered 2022-11-02 – 2022-11-03 (×2): 100 mg via ORAL
  Filled 2022-11-02 (×2): qty 2

## 2022-11-02 NOTE — Progress Notes (Signed)
Patient seen and evaluated, chart reviewed, please see EMR for updated orders. Please see full H&P dictated by admitting physician Dr. Thomes Dinning for same date of service.   Brief Summary:-  82 y.o. female with medical history significant of essential hypertension, arthritis admitted on 11/02/2022 with acute colitis  A/p  1) acute colitis--CT abdomen and pelvis shows colitis of the ascending and transverse colon -No leukocytosis -Nausea and abdominal pain persist -Continue Rocephin and Flagyl -Advance diet as tolerated -As needed antiemetics -As needed opiates  2)HTN--stable, continue amlodipine 2.5 mg daily -Iv hydralazine as needed elevated BP  3) abnormal UA--may be contaminated.Marland Kitchen  Antibiotics as above in #1 should cover anyway even if patient had a UTI  4) hypokalemia--due to GI losses, replace  5)AKI----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 -  Baseline usually around 1.3-1.6 -Creatinine currently trending down with hydration -renally adjust medications, avoid nephrotoxic agents / dehydration  / hypotension  Total care time 53 minutes  Allison Watson

## 2022-11-02 NOTE — H&P (Signed)
History and Physical    Patient: Allison Watson ZOX:[id number] DOB: 1940/06/29 DOA: 11/01/2022 DOS: the patient was seen and examined on 11/02/2022 PCP: Benetta Spar, MD  Patient coming from: Home  Chief Complaint: No chief complaint on file.  HPI: Allison Watson is a 82 y.o. female with medical history significant of essential hypertension, arthritis who presents to the emergency department due to 3-day onset of lower abdominal pain, nausea, vomiting.  Patient states that she went to [place] with her grandson and while there, she felt weak and complained of lower abdominal pain which was sharp in nature.  On returning home, abdominal pain continued and was associated with nausea and vomiting.  Patient states that the pain will eventually subside on its own, however, since the pain continue to persist, she decided to go to the ED for further evaluation and management. Of note, patient states that she had diarrhea last week, but that has since subsided at this time.  She denies fever, chills, chest pain, shortness of breath.  ED Course:  In the emergency department, BP was 162/82, other vital signs were within normal range.  Workup in the ED showed normal CBC, BMP was normal except for bicarb of 21, blood glucose 114, BUN 54, creatinine 3.34 (baseline creatinine at 1.3-1.7), albumin 3.0. CT abdomen and pelvis without contrast showed findings compatible with colitis of the ascending and transverse colon Chest x-ray showed no active disease Patient was treated with IV ceftriaxone, metronidazole, IV hydration was provided and Zofran was given.  Hospitalist was asked to admit patient for further evaluation and management.  Review of Systems: Review of systems as noted in the HPI. All other systems reviewed and are negative.   Past Medical History:  Diagnosis Date   Arthritis    Lt arm   Gout    Hypertension    Past Surgical History:  Procedure Laterality Date   ABDOMINAL  HYSTERECTOMY     TUBAL LIGATION      Social History:  reports that she has quit smoking. She has never used smokeless tobacco. She reports that she does not drink alcohol and does not use drugs.   Allergies  Allergen Reactions   Codeine Rash   Lorazepam Other (See Comments)    Hallucinations per daughter Allison Watson   Penicillins Rash    Has patient had a PCN reaction causing immediate rash, facial/tongue/throat swelling, SOB or lightheadedness with hypotension: Yes Has patient had a PCN reaction causing severe rash involving mucus membranes or skin necrosis: No Has patient had a PCN reaction that required hospitalization No Has patient had a PCN reaction occurring within the last 10 years: No If all of the above answers are "NO", then may proceed with Cephalosporin use.     Family History  Problem Relation Age of Onset   Hypertension Mother    Uterine cancer Mother    Alzheimer's disease Father    Breast cancer Sister    Parkinsonism Neg Hx      Prior to Admission medications   Medication Sig Start Date End Date Taking? Authorizing Provider  amLODipine (NORVASC) 5 MG tablet Take 5 mg by mouth daily. 10/31/21   [provider]  cyanocobalamin (,VITAMIN B-12,) 1000 MCG/ML injection Inject 1,000 mcg into the skin every 30 (thirty) days. 12/09/20   [provider]  divalproex (DEPAKOTE) 250 MG DR tablet Take 1 tablet (250 mg total) by mouth every 12 (twelve) hours. Patient not taking: Reported on 12/19/2021 03/31/21 06/20/21  Shahmehdi, Gemma Payor, MD  Ensure Max Protein (ENSURE MAX PROTEIN) LIQD Take 330 mLs (11 oz total) by mouth daily. 06/23/21   Johnson, Clanford L, MD  folic acid (FOLVITE) 1 MG tablet Take 1 mg by mouth daily. 03/06/22   [provider]  hydrALAZINE (APRESOLINE) 50 MG tablet Take 50 mg by mouth in the morning, at noon, and at bedtime. 10/09/21   [provider]  hydrochlorothiazide (MICROZIDE) 12.5 MG capsule Take 12.5 mg by mouth daily.  03/23/22   [provider]  losartan-hydrochlorothiazide (HYZAAR) 100-12.5 MG tablet Take 1 tablet by mouth daily. 10/31/21   [provider]  meloxicam (MOBIC) 7.5 MG tablet Take 7.5 mg by mouth at bedtime. 09/21/21   [provider]  Multiple Vitamin (MULTIVITAMIN) tablet Take 1 tablet by mouth daily.    [provider]  predniSONE (DELTASONE) 10 MG tablet Take 4 tablets (40 mg total) by mouth daily. 07/15/22   Vanetta Mulders, MD  traMADol (ULTRAM) 50 MG tablet Take 1 tablet (50 mg total) by mouth every 6 (six) hours as needed. 07/15/22   Burgess Amor, PA-C  traZODone (DESYREL) 100 MG tablet Take 100 mg by mouth at bedtime as needed. 30 minutes before bed. 10/14/21   [provider]    Physical Exam: BP (!) 145/72   Pulse 63   Temp 99.1 F (37.3 C) (Oral)   Resp 16   Wt 59.4 kg   SpO2 97%   BMI 25.58 kg/m   General: 82 y.o. year-old female ill appearing, but in no acute distress.  Alert and oriented x3. HEENT: NCAT, EOMI Neck: Supple, trachea medial Cardiovascular: Regular rate and rhythm with no rubs or gallops.  No thyromegaly or JVD noted.  No lower extremity edema. 2/4 pulses in all 4 extremities. Respiratory: Clear to auscultation with no wheezes or rales. Good inspiratory effort. Abdomen: Soft, tender to palpation of abdomen without guarding.  Nondistended with normal bowel sounds x4 quadrants. Muskuloskeletal: No cyanosis, clubbing or edema noted bilaterally Neuro: CN II-XII intact, strength 5/5 x 4, sensation, reflexes intact Skin: No ulcerative lesions noted or rashes Psychiatry: Judgement and insight appear normal. Mood is appropriate for condition and setting          Labs on Admission:  Basic Metabolic Panel: Recent Labs  Lab 11/01/22 2255  NA 135  K 3.6  CL 99  CO2 21*  GLUCOSE 114*  BUN 54*  CREATININE 3.34*  CALCIUM 9.3  MG 2.1   Liver Function Tests: Recent Labs  Lab 11/01/22 2255  AST 14*  ALT 13   ALKPHOS 62  BILITOT 1.0  PROT 7.1  ALBUMIN 3.0*   Recent Labs  Lab 11/01/22 2255  LIPASE 39   No results for input(s): "AMMONIA" in the last 168 hours. CBC: Recent Labs  Lab 11/01/22 2255  WBC 8.1  HGB 12.4  HCT 36.6  MCV 82.4  PLT 279   Cardiac Enzymes: No results for input(s): "CKTOTAL", "CKMB", "CKMBINDEX", "TROPONINI" in the last 168 hours.  BNP (last 3 results) No results for input(s): "BNP" in the last 8760 hours.  ProBNP (last 3 results) No results for input(s): "PROBNP" in the last 8760 hours.  CBG: No results for input(s): "GLUCAP" in the last 168 hours.  Radiological Exams on Admission: CT ABDOMEN PELVIS WO CONTRAST  Result Date: 11/02/2022 CLINICAL DATA:  Lower abdominal pain. EXAM: CT ABDOMEN AND PELVIS WITHOUT CONTRAST TECHNIQUE: Multidetector CT imaging of the abdomen and pelvis was performed following the standard  protocol without IV contrast. RADIATION DOSE REDUCTION: This exam was performed according to the departmental dose-optimization program which includes automated exposure control, adjustment of the mA and/or kV according to patient size and/or use of iterative reconstruction technique. COMPARISON:  CT abdomen and pelvis 03/08/2004 FINDINGS: Lower chest: No acute abnormality. Hepatobiliary: No focal liver abnormality is seen. No gallstones, gallbladder wall thickening, or biliary dilatation. Pancreas: Unremarkable. No pancreatic ductal dilatation or surrounding inflammatory changes. Spleen: Normal in size without focal abnormality. Adrenals/Urinary Tract: Adrenal glands are unremarkable. Kidneys are normal, without renal calculi, focal lesion, or hydronephrosis. Bladder is unremarkable. Stomach/Bowel: There is wall thickening of the ascending and transverse colon without surrounding inflammation. The appendix is within normal limits. There is no pneumatosis, free air or bowel obstruction. There is a small hiatal hernia. The stomach is otherwise within  normal limits. Vascular/Lymphatic: Aortic atherosclerosis. No enlarged abdominal or pelvic lymph nodes. Reproductive: Status post hysterectomy. No adnexal masses. Other: No abdominal wall hernia or abnormality. No abdominopelvic ascites. Musculoskeletal: No acute or significant osseous findings. IMPRESSION: 1. Findings compatible with colitis of the ascending and transverse colon. 2. Small hiatal hernia. Aortic Atherosclerosis (ICD10-I70.0). Electronically Signed   By: Darliss Cheney M.D.   On: 11/02/2022 00:29   DG Chest Port 1 View  Result Date: 11/02/2022 CLINICAL DATA:  Weakness EXAM: PORTABLE CHEST 1 VIEW COMPARISON:  06/20/2021 FINDINGS: Heart and mediastinal contours are within normal limits. No focal opacities or effusions. No acute bony abnormality. Aortic atherosclerosis. IMPRESSION: No active disease. Electronically Signed   By: Charlett Nose M.D.   On: 11/02/2022 00:04    EKG: I independently viewed the EKG done and my findings are as followed: Normal sinus rhythm at a rate of 71 bpm  Assessment/Plan Present on Admission:  Acute colitis  Essential hypertension  Principal Problem:   Acute colitis Active Problems:   Essential hypertension   Abdominal pain   Nausea and vomiting   Acute kidney injury superimposed on chronic kidney disease (HCC)   Hypoalbuminemia due to protein-calorie malnutrition (HCC)  Abdominal pain possibly secondary to acute colitis Continue IV NS at 75 mLs/Hr Patient was started on IV ciprofloxacin and metronidazole, we shall continue same at this time Continue IV Dilaudid 0.5 mg q.4h p.r.n. for moderate to severe pain Continue IV Zofran p.r.n. Continue clear liquid diet with plan to advanced diet as tolerated Obtain blood culture x2  Nausea and vomiting possibly secondary to above Continue Zofran as needed  Acute kidney injury superimposed on CKD Creatinine 3.34 (baseline creatinine at 1.3-1.7) Continue gentle hydration Renally adjust medications,  avoid nephrotoxic agents/dehydration/hypotension Consider nephrology consult if no improvement in kidney function  Hypoalbuminemia possibly secondary to moderate protein calorie malnutrition Albumin 3.0, protein supplement will be provided  Essential hypertension Continue amlodipine, losartan, hydralazine  Rheumatoid arthritis Continue Tylenol as needed Continue Voltaren gel   DVT prophylaxis: Heparin subcu  Advance Care Planning: Full code  Consults: None  Family Communication: None at bedside  Severity of Illness: The appropriate patient status for this patient is INPATIENT. Inpatient status is judged to be reasonable and necessary in order to provide the required intensity of service to ensure the patient's safety. The patient's presenting symptoms, physical exam findings, and initial radiographic and laboratory data in the context of their chronic comorbidities is felt to place them at high risk for further clinical deterioration. Furthermore, it is not anticipated that the patient will be medically stable for discharge from the hospital within 2 midnights of admission.   *  I certify that at the point of admission it is my clinical judgment that the patient will require inpatient hospital care spanning beyond 2 midnights from the point of admission due to high intensity of service, high risk for further deterioration and high frequency of surveillance required.*  Author: Frankey Shown, DO 11/02/2022 7:32 AM  For on call review [email or web address]. Amlodipine, hydralazine, losartan

## 2022-11-02 NOTE — Progress Notes (Signed)
Transition of Care [facility]) - Inpatient Brief Assessment   Patient Details  Name: Allison Watson MRN: [id number] Date of Birth: 1940-10-17  Transition of Care [facility]) CM/SW Contact:    Annice Needy, LCSW Phone Number: 11/02/2022, 1:50 PM   Clinical Narrative: Transition of Care Department [facility]) has reviewed patient and no TOC needs have been identified at this time. We will continue to monitor patient advancement through interdisciplinary progression rounds. If new patient transition needs arise, please place a TOC consult.    Transition of Care Asessment: Insurance and Status: Insurance coverage has been reviewed Patient has primary care physician: Yes Home environment has been reviewed: yes Prior level of function:: independent Prior/Current Home Services: No current home services Social Determinants of Health Reivew: SDOH reviewed no interventions necessary Readmission risk has been reviewed: Yes Transition of care needs: no transition of care needs at this time

## 2022-11-03 DIAGNOSIS — K529 Noninfective gastroenteritis and colitis, unspecified: Secondary | ICD-10-CM | POA: Diagnosis not present

## 2022-11-03 LAB — BASIC METABOLIC PANEL
Anion gap: 8 (ref 5–15)
BUN: 41 mg/dL — ABNORMAL HIGH (ref 8–23)
CO2: 23 mmol/L (ref 22–32)
Calcium: 8.6 mg/dL — ABNORMAL LOW (ref 8.9–10.3)
Chloride: 106 mmol/L (ref 98–111)
Creatinine, Ser: 2.16 mg/dL — ABNORMAL HIGH (ref 0.44–1.00)
GFR, Estimated: 23 mL/min — ABNORMAL LOW (ref >60)
Glucose, Bld: 94 mg/dL (ref 70–99)
Potassium: 3.2 mmol/L — ABNORMAL LOW (ref 3.5–5.1)
Sodium: 137 mmol/L (ref 135–145)

## 2022-11-03 LAB — CBC
HCT: 32.4 % — ABNORMAL LOW (ref 36.0–46.0)
Hemoglobin: 10.6 g/dL — ABNORMAL LOW (ref 12.0–15.0)
MCH: 28 pg (ref 26.0–34.0)
MCHC: 32.7 g/dL (ref 30.0–36.0)
MCV: 85.5 fL (ref 80.0–100.0)
Platelets: 278 10*3/uL (ref 150–400)
RBC: 3.79 MIL/uL — ABNORMAL LOW (ref 3.87–5.11)
RDW: 14.5 % (ref 11.5–15.5)
WBC: 7.5 10*3/uL (ref 4.0–10.5)
nRBC: 0 % (ref 0.0–0.2)

## 2022-11-03 LAB — MAGNESIUM: Magnesium: 1.7 mg/dL (ref 1.7–2.4)

## 2022-11-03 MED ORDER — POTASSIUM CHLORIDE CRYS ER 20 MEQ PO TBCR
40.0000 meq | EXTENDED_RELEASE_TABLET | ORAL | Status: AC
  Administered 2022-11-03 (×2): 40 meq via ORAL
  Filled 2022-11-03 (×3): qty 2

## 2022-11-03 MED ORDER — ORAL CARE MOUTH RINSE: 15.0000 mL | OROMUCOSAL | Status: DC | PRN

## 2022-11-03 MED ORDER — MAGNESIUM SULFATE 2 GM/50ML IV SOLN
2.0000 g | Freq: Once | INTRAVENOUS | Status: AC
  Administered 2022-11-03: 2 g via INTRAVENOUS
  Filled 2022-11-03: qty 50

## 2022-11-03 MED ORDER — SODIUM CHLORIDE 0.9 % IV SOLN: INTRAVENOUS | Status: AC

## 2022-11-03 NOTE — TOC Initial Note (Signed)
Transition of Care [facility]) - Initial/Assessment Note    Patient Details  Name: Allison Watson MRN: [id number] Date of Birth: Feb 09, 1941  Transition of Care [facility]) CM/SW Contact:    Annice Needy, LCSW Phone Number: 11/03/2022, 2:16 PM  Clinical Narrative:                 Patient from home. Admitted for acute colitis. Considered high risk for readmission. Independent. TOC will follow for needs.   Expected Discharge Plan: Home/Self Care     Patient Goals and CMS Choice            Expected Discharge Plan and Services       Living arrangements for the past 2 months: Single Family Home                                      Prior Living Arrangements/Services Living arrangements for the past 2 months: Single Family Home Lives with:: Self Patient language and need for interpreter reviewed:: Yes        Need for Family Participation in Patient Care: Yes (Comment) Care giver support system in place?: No (comment)   Criminal Activity/Legal Involvement Pertinent to Current Situation/Hospitalization: No - Comment as needed  Activities of Daily Living      Permission Sought/Granted                  Emotional Assessment       Orientation: : Oriented to Self, Oriented to Place, Oriented to  Time, Oriented to Situation Alcohol / Substance Use: Not Applicable Psych Involvement: No (comment)  Admission diagnosis:  Colitis [K52.9] Acute colitis [K52.9] AKI (acute kidney injury) (HCC) [N17.9] Patient Active Problem List   Diagnosis Date Noted   Acute colitis 11/02/2022   Abdominal pain 11/02/2022   Nausea and vomiting 11/02/2022   Acute kidney injury superimposed on chronic kidney disease (HCC) 11/02/2022   Hypoalbuminemia due to protein-calorie malnutrition (HCC) 11/02/2022   Malnutrition of moderate degree 06/22/2021   Failure to thrive in adult 06/20/2021   COVID-19 virus infection 06/20/2021   Dehydration 06/20/2021   Dysphagia 06/20/2021    Recurrent falls 05/31/2021   UTI (urinary tract infection) 05/31/2021   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   CKD (chronic kidney disease), stage IV (HCC) 10/16/2020   Toxic metabolic encephalopathy 10/12/2020   Altered mental status 10/11/2020   Obesity (BMI 30-39.9) 10/11/2020   Hypomagnesemia 10/11/2020   Normocytic anemia 10/11/2020   Anemia due to chronic kidney disease 10/11/2020   Psychosis (HCC) 10/07/2020   Acute metabolic encephalopathy 10/05/2020   Shoulder pain 02/20/2019   INSOMNIA, CHRONIC 05/08/2007   BRONCHITIS, ACUTE WITH BRONCHOSPASM 04/16/2007   CELLULITIS/ABSCESS, TRUNK 10/31/2006   MUSCLE SPASM 10/31/2006   Essential hypertension 03/13/2006   ALLERGIC RHINITIS 03/13/2006   Osteoarthrosis, unspecified whether generalized or localized, unspecified site 03/13/2006   ARTHRITIS 03/13/2006   PCP:  Benetta Spar, MD Pharmacy:   [facility] [address] [place] - [address] [place] [address] Phone: [phone] Fax: [phone]     Social Determinants of Health (SDOH) Social History: SDOH Screenings   Tobacco Use: Medium Risk (07/15/2022)   SDOH Interventions:     Readmission Risk Interventions     No data to display

## 2022-11-03 NOTE — Progress Notes (Signed)
PROGRESS NOTE  Allison Watson, is a 82 y.o. female, DOB - August 18, 1940, ZOX:[id number]  Admit date - 11/01/2022   Admitting Physician Frankey Shown, DO  Outpatient Primary MD for the patient is Fanta, Wayland Salinas, MD  LOS - 1  CC---abd pain and nausea  Brief Summary:-  82 y.o. 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 -  Baseline usually around 1.3-1.6 -Creatinine 3.34 >>2.80 >>2.16 -Continue IV fluids, continue to encourage increased oral intake -renally adjust medications, avoid nephrotoxic agents / dehydration  / hypotension  3)Abnormal UA--may be contaminated.Marland Kitchen  Antibiotics as above in #1 should cover anyway even if patient had a UTI   4) hypokalemia--due to GI losses, replace   5) acute anemia--admission Hgb 12.4, be drifting down most likely due to hemodilution from IV fluids = No bleeding concerns at this time continue to monitor  6)HTN--stable, continue amlodipine 2.5 mg daily -Iv hydralazine as needed elevated BP   Status is: Inpatient   Disposition: The patient is from: Home              Anticipated d/c is to: Home              Anticipated d/c date is: 1 day--- if renal function continues to improve              Patient currently is not medically stable to d/c. Barriers: Not Clinically Stable-   Code Status :  -  Code Status: Full Code   Family Communication:    NA (patient is alert, awake and coherent)   DVT Prophylaxis  :   - SCDs  heparin injection 5,000 Units Start: 11/02/22 0730 SCDs Start: 11/02/22 0722   Lab  Results  Component Value Date   PLT 278 11/03/2022    Inpatient Medications  Scheduled Meds:  amLODipine  5 mg Oral Daily   vitamin B-12  1,000 mcg Oral Daily   feeding supplement  237 mL Oral BID BM   folic acid  1 mg Oral Daily   heparin  5,000 Units Subcutaneous Q8H   hydrALAZINE  50 mg Oral Q8H   losartan  100 mg Oral Daily   potassium chloride  40 mEq Oral Q3H   traZODone  100 mg Oral QHS   Continuous Infusions:  sodium chloride 100 mL/hr at 11/03/22 0835   cefTRIAXone (ROCEPHIN)  IV 2 g (11/03/22 0158)   metronidazole 500 mg (11/03/22 0026)   PRN Meds:.acetaminophen **OR** acetaminophen, hydrALAZINE, HYDROmorphone (DILAUDID) injection, ondansetron **OR** ondansetron (ZOFRAN) IV, oxyCODONE   Anti-infectives (From admission, onward)    Start     Dose/Rate Route Frequency Ordered Stop   11/03/22 0100  cefTRIAXone (ROCEPHIN) 2 g in sodium chloride 0.9 % 100 mL IVPB        2 g 200 mL/hr over 30 Minutes Intravenous Every 24 hours 11/02/22 0745     11/02/22 1200  metroNIDAZOLE (FLAGYL) IVPB 500 mg        500 mg 100 mL/hr over 60 Minutes Intravenous Every 12 hours 11/02/22 0714     11/02/22 0245  metroNIDAZOLE (FLAGYL) IVPB 500 mg  500 mg 100 mL/hr over 60 Minutes Intravenous  Once 11/02/22 0233 11/02/22 0300   11/02/22 0130  cefTRIAXone (ROCEPHIN) 2 g in sodium chloride 0.9 % 100 mL IVPB        2 g 200 mL/hr over 30 Minutes Intravenous  Once 11/02/22 0233 11/02/22 0155         Subjective: Allison Watson today has no fevers, no emesis,  No chest pain,   - Oral intake is fair -No further diarrhea -Voiding okay   Objective: Vitals:   11/02/22 1211 11/02/22 1602 11/02/22 2008 11/03/22 0422  BP: (!) 156/56 135/78 (!) 140/62 (!) 143/58  Pulse: 64 68 78 69  Resp: 17  18 16   Temp: 98.4 F (36.9 C) 98 F (36.7 C) 98.7 F (37.1 C) 98.3 F (36.8 C)  TempSrc: Oral Oral Oral Oral  SpO2: 100% 100% 99% 98%  Weight:        Intake/Output Summary (Last 24  hours) at 11/03/2022 1143 Last data filed at 11/03/2022 0557 Gross per 24 hour  Intake 2262.03 ml  Output --  Net 2262.03 ml   Filed Weights   11/01/22 2248  Weight: 59.4 kg    Physical Exam  Gen:- Awake Alert, in no acute distress HEENT:- [place].AT, No sclera icterus Neck-Supple Neck,No JVD,.  Lungs-  CTAB , fair symmetrical air movement CV- S1, S2 normal, regular  Abd-  +ve B.Sounds, Abd Soft, No tenderness, no C VA area tenderness Extremity/Skin:- No  edema, pedal pulses present  Psych-affect is appropriate, oriented x3 Neuro-no new focal deficits, no tremors  Data Reviewed: I have personally reviewed following labs and imaging studies  CBC: Recent Labs  Lab 11/01/22 2255 11/02/22 0722 11/03/22 0429  WBC 8.1 7.0 7.5  HGB 12.4 11.2* 10.6*  HCT 36.6 33.8* 32.4*  MCV 82.4 84.5 85.5  PLT 279 236 278   Basic Metabolic Panel: Recent Labs  Lab 11/01/22 2255 11/02/22 0722 11/03/22 0429  NA 135 135 137  K 3.6 3.3* 3.2*  CL 99 100 106  CO2 21* 24 23  GLUCOSE 114* 99 94  BUN 54* 51* 41*  CREATININE 3.34* 2.80* 2.16*  CALCIUM 9.3 8.9 8.6*  MG 2.1  --  1.7  PHOS  --  2.8  --    GFR: Estimated Creatinine Clearance: 17 mL/min (A) (by C-G formula based on SCr of 2.16 mg/dL (H)). Liver Function Tests: Recent Labs  Lab 11/01/22 2255 11/02/22 0722  AST 14* 12*  ALT 13 12  ALKPHOS 62 54  BILITOT 1.0 0.8  PROT 7.1 6.1*  ALBUMIN 3.0* 2.6*   Recent Results (from the past 240 hour(s))  Culture, blood (Routine X 2) w Reflex to ID Panel     Status: None (Preliminary result)   Collection Time: 11/02/22  7:38 AM   Specimen: BLOOD  Result Value Ref Range Status   Specimen Description BLOOD LEFT ANTECUBITAL  Final   Special Requests   Final    BOTTLES DRAWN AEROBIC AND ANAEROBIC Blood Culture adequate volume   Culture   Final    NO GROWTH 1 DAY Performed at [facility], [address] [place], [place] [address]    Report Status PENDING  Incomplete  Culture, blood  (Routine X 2) w Reflex to ID Panel     Status: None (Preliminary result)   Collection Time: 11/02/22  7:38 AM   Specimen: BLOOD  Result Value Ref Range Status   Specimen Description BLOOD BLOOD LEFT WRIST  Final   Special  Requests   Final    BOTTLES DRAWN AEROBIC AND ANAEROBIC Blood Culture adequate volume   Culture   Final    NO GROWTH 1 DAY Performed at [facility], [address], [place], [place] [address]    Report Status PENDING  Incomplete    Radiology Studies: CT ABDOMEN PELVIS WO CONTRAST  Result Date: 11/02/2022 CLINICAL DATA:  Lower abdominal pain. EXAM: CT ABDOMEN AND PELVIS WITHOUT CONTRAST TECHNIQUE: Multidetector CT imaging of the abdomen and pelvis was performed following the standard protocol without IV contrast. RADIATION DOSE REDUCTION: This exam was performed according to the departmental dose-optimization program which includes automated exposure control, adjustment of the mA and/or kV according to patient size and/or use of iterative reconstruction technique. COMPARISON:  CT abdomen and pelvis 03/08/2004 FINDINGS: Lower chest: No acute abnormality. Hepatobiliary: No focal liver abnormality is seen. No gallstones, gallbladder wall thickening, or biliary dilatation. Pancreas: Unremarkable. No pancreatic ductal dilatation or surrounding inflammatory changes. Spleen: Normal in size without focal abnormality. Adrenals/Urinary Tract: Adrenal glands are unremarkable. Kidneys are normal, without renal calculi, focal lesion, or hydronephrosis. Bladder is unremarkable. Stomach/Bowel: There is wall thickening of the ascending and transverse colon without surrounding inflammation. The appendix is within normal limits. There is no pneumatosis, free air or bowel obstruction. There is a small hiatal hernia. The stomach is otherwise within normal limits. Vascular/Lymphatic: Aortic atherosclerosis. No enlarged abdominal or pelvic lymph nodes. Reproductive: Status post hysterectomy. No adnexal  masses. Other: No abdominal wall hernia or abnormality. No abdominopelvic ascites. Musculoskeletal: No acute or significant osseous findings. IMPRESSION: 1. Findings compatible with colitis of the ascending and transverse colon. 2. Small hiatal hernia. Aortic Atherosclerosis (ICD10-I70.0). Electronically Signed   By: Darliss Cheney M.D.   On: 11/02/2022 00:29   DG Chest Port 1 View  Result Date: 11/02/2022 CLINICAL DATA:  Weakness EXAM: PORTABLE CHEST 1 VIEW COMPARISON:  06/20/2021 FINDINGS: Heart and mediastinal contours are within normal limits. No focal opacities or effusions. No acute bony abnormality. Aortic atherosclerosis. IMPRESSION: No active disease. Electronically Signed   By: Charlett Nose M.D.   On: 11/02/2022 00:04    Scheduled Meds:  amLODipine  5 mg Oral Daily   vitamin B-12  1,000 mcg Oral Daily   feeding supplement  237 mL Oral BID BM   folic acid  1 mg Oral Daily   heparin  5,000 Units Subcutaneous Q8H   hydrALAZINE  50 mg Oral Q8H   losartan  100 mg Oral Daily   potassium chloride  40 mEq Oral Q3H   traZODone  100 mg Oral QHS   Continuous Infusions:  sodium chloride 100 mL/hr at 11/03/22 0835   cefTRIAXone (ROCEPHIN)  IV 2 g (11/03/22 0158)   metronidazole 500 mg (11/03/22 0026)    LOS: 1 day   Shon Hale M.D on 11/03/2022 at 11:43 AM  Go to [email or web address] - for contact info  [facility] - Office  [phone]  If 7PM-7AM, please contact night-coverage [email or web address] 11/03/2022, 11:43 AM

## 2022-11-03 NOTE — Progress Notes (Signed)
No  intestinal symptoms last night and no Bms. Slept well

## 2022-11-04 DIAGNOSIS — K529 Noninfective gastroenteritis and colitis, unspecified: Secondary | ICD-10-CM | POA: Diagnosis not present

## 2022-11-04 LAB — RENAL FUNCTION PANEL
Albumin: 2.3 g/dL — ABNORMAL LOW (ref 3.5–5.0)
Anion gap: 5 (ref 5–15)
BUN: 25 mg/dL — ABNORMAL HIGH (ref 8–23)
CO2: 23 mmol/L (ref 22–32)
Calcium: 8.6 mg/dL — ABNORMAL LOW (ref 8.9–10.3)
Chloride: 114 mmol/L — ABNORMAL HIGH (ref 98–111)
Creatinine, Ser: 1.57 mg/dL — ABNORMAL HIGH (ref 0.44–1.00)
GFR, Estimated: 33 mL/min — ABNORMAL LOW (ref >60)
Glucose, Bld: 83 mg/dL (ref 70–99)
Phosphorus: 2.3 mg/dL — ABNORMAL LOW (ref 2.5–4.6)
Potassium: 4.4 mmol/L (ref 3.5–5.1)
Sodium: 142 mmol/L (ref 135–145)

## 2022-11-04 MED ORDER — CEFDINIR 300 MG PO CAPS: 300.0000 mg | ORAL_CAPSULE | Freq: Two times a day (BID) | ORAL | 0 refills | Status: AC

## 2022-11-04 MED ORDER — METRONIDAZOLE 500 MG PO TABS: 500.0000 mg | ORAL_TABLET | Freq: Three times a day (TID) | ORAL | 0 refills | Status: AC

## 2022-11-04 MED ORDER — AMLODIPINE BESYLATE 10 MG PO TABS: 10.0000 mg | ORAL_TABLET | Freq: Every day | ORAL | 3 refills | Status: AC

## 2022-11-04 MED ORDER — TRAZODONE HCL 100 MG PO TABS: 150.0000 mg | ORAL_TABLET | Freq: Every day | ORAL | 3 refills | Status: AC

## 2022-11-04 NOTE — Discharge Summary (Signed)
Allison Watson, is a 82 y.o. female  DOB 1940/05/22  MRN [id number].  Admission date:  11/01/2022  Admitting Physician  Frankey Shown, DO  Discharge Date:  11/04/2022   Primary MD  Benetta Spar, MD  Recommendations for primary care physician for things to follow:   1)Stop Losartan-Hydrochlorothiazide/HCTZ 2) eat and drink frequently and avoid dehydration 3) repeat CBC and BMP blood test within the week 4) call or return if any blood in your stool or fevers or abdominal pain 5)Avoid ibuprofen/Advil/Aleve/Motrin/Goody Powders/Naproxen/BC powders/Meloxicam/Diclofenac/Indomethacin and other Nonsteroidal anti-inflammatory medications as these will make you more likely to bleed and can cause stomach ulcers, can also cause Kidney problems.   Admission Diagnosis  Colitis [K52.9] Acute colitis [K52.9] AKI (acute kidney injury) (HCC) [N17.9]   Discharge Diagnosis  Colitis [K52.9] Acute colitis [K52.9] AKI (acute kidney injury) (HCC) [N17.9]    Principal Problem:   Acute colitis Active Problems:   Essential hypertension   Abdominal pain   Nausea and vomiting   Acute kidney injury superimposed on chronic kidney disease (HCC)   Hypoalbuminemia due to protein-calorie malnutrition (HCC)      Past Medical History:  Diagnosis Date   Arthritis    Lt arm   Gout    Hypertension     Past Surgical History:  Procedure Laterality Date   ABDOMINAL HYSTERECTOMY     TUBAL LIGATION       HPI  from the history and physical done on the day of admission:   CC---abd pain and nausea    HPI: JAMILET DARLEY is a 83 y.o. female with medical history significant of essential hypertension, arthritis who presents to the emergency department due to 3-day onset of lower abdominal pain, nausea, vomiting.  Patient states that she went to [employer] with her grandson and while there, she felt weak and complained  of lower abdominal pain which was sharp in nature.  On returning home, abdominal pain continued and was associated with nausea and vomiting.  Patient states that the pain will eventually subside on its own, however, since the pain continue to persist, she decided to go to the ED for further evaluation and management. Of note, patient states that she had diarrhea last week, but that has since subsided at this time.  She denies fever, chills, chest pain, shortness of breath.   ED Course:  In the emergency department, BP was 162/82, other vital signs were within normal range.  Workup in the ED showed normal CBC, BMP was normal except for bicarb of 21, blood glucose 114, BUN 54, creatinine 3.34 (baseline creatinine at 1.3-1.7), albumin 3.0. CT abdomen and pelvis without contrast showed findings compatible with colitis of the ascending and transverse colon Chest x-ray showed no active disease Patient was treated with IV ceftriaxone, metronidazole, IV hydration was provided and Zofran was given.  Hospitalist was asked to admit patient for further evaluation and management.   Review of Systems: Review of systems as noted in the HPI. All other systems reviewed and are  negative.    Hospital Course:     Brief Summary:-  82 y.o. female with medical history significant of essential hypertension, arthritis admitted on 11/02/2022 with acute colitis   A/p  1) acute colitis--CT abdomen and pelvis shows colitis of the ascending and transverse colon -No leukocytosis No fevers or vomiting- -diarrhea has resolved -Treated with Rocephin and Flagyl -As needed antiemetics -As needed opiates -Tolerating solid diet -Okay to discharge on Omnicef and Flagyl--- renal function continues to improve, so Omnicef dosage was not adjusted as she to start Omnicef on 11/05/2022 after getting Rocephin today 11/04/2022--- I anticipate diet given improvement in renal function on 11/05/2022 creatinine clearance to be close to 30    2)AKI----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 -  Baseline usually around 1.3-1.6 -Creatinine 3.34 >>2.80 >>2.16>>1.57 -Treated with IV fluids -Tolerating oral intake well -Repeat BMP within a week   3)Abnormal UA--may be contaminated.Marland Kitchen  Antibiotics as above in #1 should cover anyway even if patient had a UTI   4) hypokalemia--due to GI losses, replaced and normalized   5) acute anemia--admission Hgb 12.4, drifting down most likely due to hemodilution from IV fluids = No bleeding concerns at this time continue to monitor   6)HTN--stable,  -Stop losartan HCTZ due to AKI and hypokalemia -Okay to use amlodipine and labetalol  Disposition: The patient is from: Home              Anticipated d/c is to: Home        Code Status :  -  Code Status: Full Code    Discharge Condition: stable  Follow UP   Follow-up Information     Benetta Spar, MD. Schedule an appointment as soon as possible for a visit in 1 week(s).   Specialty: Internal Medicine Contact information: [address] [place] [address] [phone]                Diet and Activity recommendation:  As advised  Discharge Instructions    Discharge Instructions     Call MD for:  difficulty breathing, headache or visual disturbances   Complete by: As directed    Call MD for:  persistant dizziness or light-headedness   Complete by: As directed    Call MD for:  persistant nausea and vomiting   Complete by: As directed    Call MD for:  temperature >100.4   Complete by: As directed    Diet - low sodium heart healthy   Complete by: As directed    Discharge instructions   Complete by: As directed    1)Stop Losartan-Hydrochlorothiazide/HCTZ 2) eat and drink frequently and avoid dehydration 3) repeat CBC and BMP blood test within the week 4) call or return if any blood in your stool or fevers or abdominal pain 5)Avoid  ibuprofen/Advil/Aleve/Motrin/Goody Powders/Naproxen/BC powders/Meloxicam/Diclofenac/Indomethacin and other Nonsteroidal anti-inflammatory medications as these will make you more likely to bleed and can cause stomach ulcers, can also cause Kidney problems.   Increase activity slowly   Complete by: As directed        Discharge Medications     Allergies as of 11/04/2022       Reactions   Codeine Rash   Lorazepam Other (See Comments)   Hallucinations per daughter Marcelino Duster   Penicillins Rash        Medication List     STOP taking these medications    losartan-hydrochlorothiazide 100-12.5 MG tablet Commonly known as: HYZAAR  meloxicam 7.5 MG tablet Commonly known as: MOBIC       TAKE these medications    acetaminophen 500 MG tablet Commonly known as: TYLENOL Take 500 mg by mouth every 6 (six) hours as needed for mild pain.   amLODipine 10 MG tablet Commonly known as: NORVASC Take 1 tablet (10 mg total) by mouth daily.   B-12 Compliance Injection 1000 MCG/ML Kit Generic drug: Cyanocobalamin Inject 1 mL as directed every 3 (three) months.   cefdinir 300 MG capsule Commonly known as: OMNICEF Take 1 capsule (300 mg total) by mouth 2 (two) times daily for 5 days. Start taking on: November 05, 2022   folic acid 1 MG tablet Commonly known as: FOLVITE Take 1 mg by mouth daily.   hydrALAZINE 50 MG tablet Commonly known as: APRESOLINE Take 50 mg by mouth in the morning, at noon, and at bedtime.   labetalol 100 MG tablet Commonly known as: NORMODYNE Take 100 mg by mouth 2 (two) times daily.   metroNIDAZOLE 500 MG tablet Commonly known as: FLAGYL Take 1 tablet (500 mg total) by mouth 3 (three) times daily for 5 days.   traZODone 100 MG tablet Commonly known as: DESYREL Take 1.5 tablets (150 mg total) by mouth at bedtime.        Major procedures and Radiology Reports - PLEASE review detailed and final reports for all details, in brief -   CT ABDOMEN PELVIS WO  CONTRAST  Result Date: 11/02/2022 CLINICAL DATA:  Lower abdominal pain. EXAM: CT ABDOMEN AND PELVIS WITHOUT CONTRAST TECHNIQUE: Multidetector CT imaging of the abdomen and pelvis was performed following the standard protocol without IV contrast. RADIATION DOSE REDUCTION: This exam was performed according to the departmental dose-optimization program which includes automated exposure control, adjustment of the mA and/or kV according to patient size and/or use of iterative reconstruction technique. COMPARISON:  CT abdomen and pelvis 03/08/2004 FINDINGS: Lower chest: No acute abnormality. Hepatobiliary: No focal liver abnormality is seen. No gallstones, gallbladder wall thickening, or biliary dilatation. Pancreas: Unremarkable. No pancreatic ductal dilatation or surrounding inflammatory changes. Spleen: Normal in size without focal abnormality. Adrenals/Urinary Tract: Adrenal glands are unremarkable. Kidneys are normal, without renal calculi, focal lesion, or hydronephrosis. Bladder is unremarkable. Stomach/Bowel: There is wall thickening of the ascending and transverse colon without surrounding inflammation. The appendix is within normal limits. There is no pneumatosis, free air or bowel obstruction. There is a small hiatal hernia. The stomach is otherwise within normal limits. Vascular/Lymphatic: Aortic atherosclerosis. No enlarged abdominal or pelvic lymph nodes. Reproductive: Status post hysterectomy. No adnexal masses. Other: No abdominal wall hernia or abnormality. No abdominopelvic ascites. Musculoskeletal: No acute or significant osseous findings. IMPRESSION: 1. Findings compatible with colitis of the ascending and transverse colon. 2. Small hiatal hernia. Aortic Atherosclerosis (ICD10-I70.0). Electronically Signed   By: Darliss Cheney M.D.   On: 11/02/2022 00:29   DG Chest Port 1 View  Result Date: 11/02/2022 CLINICAL DATA:  Weakness EXAM: PORTABLE CHEST 1 VIEW COMPARISON:  06/20/2021 FINDINGS: Heart and  mediastinal contours are within normal limits. No focal opacities or effusions. No acute bony abnormality. Aortic atherosclerosis. IMPRESSION: No active disease. Electronically Signed   By: Charlett Nose M.D.   On: 11/02/2022 00:04    Micro Results  Recent Results (from the past 240 hour(s))  Culture, blood (Routine X 2) w Reflex to ID Panel     Status: None (Preliminary result)   Collection Time: 11/02/22  7:38 AM   Specimen: BLOOD  Result Value  Ref Range Status   Specimen Description BLOOD LEFT ANTECUBITAL  Final   Special Requests   Final    BOTTLES DRAWN AEROBIC AND ANAEROBIC Blood Culture adequate volume   Culture   Final    NO GROWTH 2 DAYS Performed at [facility], [address] [place], [place] [address]    Report Status PENDING  Incomplete  Culture, blood (Routine X 2) w Reflex to ID Panel     Status: None (Preliminary result)   Collection Time: 11/02/22  7:38 AM   Specimen: BLOOD  Result Value Ref Range Status   Specimen Description BLOOD BLOOD LEFT WRIST  Final   Special Requests   Final    BOTTLES DRAWN AEROBIC AND ANAEROBIC Blood Culture adequate volume   Culture   Final    NO GROWTH 2 DAYS Performed at [facility], [address], [place], [place] [address]    Report Status PENDING  Incomplete    Today   Subjective    Ilona Peppers today has no new complaints No fever  Or chills   No Nausea, Vomiting or Diarrhea -- Eating and drinking well          Patient has been seen and examined prior to discharge   Objective   Blood pressure 137/73, pulse 78, temperature 98.3 F (36.8 C), temperature source Oral, resp. rate 18, weight 59.4 kg, SpO2 99%.   Intake/Output Summary (Last 24 hours) at 11/04/2022 1434 Last data filed at 11/04/2022 1300 Gross per 24 hour  Intake 1421.61 ml  Output --  Net 1421.61 ml    Exam Gen:- Awake Alert, no acute distress  HEENT:- [place].AT, No sclera icterus Neck-Supple Neck,No JVD,.  Lungs-  CTAB , good air movement  bilaterally CV- S1, S2 normal, regular Abd-  +ve B.Sounds, Abd Soft, No tenderness, no CVA area tenderness Extremity/Skin:- No  edema,   good pulses Psych-affect is appropriate, oriented x3 Neuro-no new focal deficits, no tremors    Data Review   CBC w Diff:  Lab Results  Component Value Date   WBC 7.5 11/03/2022   HGB 10.6 (L) 11/03/2022   HCT 32.4 (L) 11/03/2022   PLT 278 11/03/2022   LYMPHOPCT 56 06/21/2021   MONOPCT 12 06/21/2021   EOSPCT 4 06/21/2021   BASOPCT 1 06/21/2021    CMP:  Lab Results  Component Value Date   NA 142 11/04/2022   NA 146 (H) 12/19/2021   K 4.4 11/04/2022   CL 114 (H) 11/04/2022   CO2 23 11/04/2022   BUN 25 (H) 11/04/2022   BUN 26 12/19/2021   CREATININE 1.57 (H) 11/04/2022   PROT 6.1 (L) 11/02/2022   PROT 6.9 12/19/2021   ALBUMIN 2.3 (L) 11/04/2022   ALBUMIN 4.3 12/19/2021   BILITOT 0.8 11/02/2022   BILITOT 0.3 12/19/2021   ALKPHOS 54 11/02/2022   AST 12 (L) 11/02/2022   ALT 12 11/02/2022  .  Total Discharge time is about 33 minutes  Shon Hale M.D on 11/04/2022 at 2:34 PM  Go to [email or web address] -  for contact info  [facility] - Office  [phone]

## 2022-11-04 NOTE — Progress Notes (Signed)
Patient discharged home today, transported home by family. Discharge paperwork went over with patient and daughter, both verbalized understanding. Belongings sent home with patient.  

## 2022-11-04 NOTE — Discharge Instructions (Signed)
1)Stop Losartan-Hydrochlorothiazide/HCTZ 2) eat and drink frequently and avoid dehydration 3) repeat CBC and BMP blood test within the week 4) call or return if any blood in your stool or fevers or abdominal pain 5)Avoid ibuprofen/Advil/Aleve/Motrin/Goody Powders/Naproxen/BC powders/Meloxicam/Diclofenac/Indomethacin and other Nonsteroidal anti-inflammatory medications as these will make you more likely to bleed and can cause stomach ulcers, can also cause Kidney problems.

## 2022-11-06 ENCOUNTER — Ambulatory Visit: Payer: Medicare HMO | Admitting: Neurology

## 2022-11-06 LAB — CULTURE, BLOOD (ROUTINE X 2)
Culture: NO GROWTH
Special Requests: ADEQUATE

## 2022-11-07 LAB — CULTURE, BLOOD (ROUTINE X 2)
Culture: NO GROWTH
Special Requests: ADEQUATE

## 2022-11-29 ENCOUNTER — Other Ambulatory Visit (HOSPITAL_COMMUNITY): Payer: Self-pay | Admitting: Internal Medicine

## 2022-11-29 ENCOUNTER — Ambulatory Visit (HOSPITAL_COMMUNITY)
Admission: RE | Admit: 2022-11-29 | Discharge: 2022-11-29 | Disposition: A | Discharge status: Home / Self Care | Payer: Medicare HMO | Source: Ambulatory Visit | Attending: Internal Medicine | Admitting: Internal Medicine

## 2022-11-29 DIAGNOSIS — G250 Essential tremor: Secondary | ICD-10-CM | POA: Diagnosis not present

## 2022-11-29 DIAGNOSIS — Z1231 Encounter for screening mammogram for malignant neoplasm of breast: Secondary | ICD-10-CM | POA: Insufficient documentation

## 2022-11-29 DIAGNOSIS — R262 Difficulty in walking, not elsewhere classified: Secondary | ICD-10-CM | POA: Diagnosis not present

## 2022-11-29 DIAGNOSIS — G2401 Drug induced subacute dyskinesia: Secondary | ICD-10-CM | POA: Diagnosis not present

## 2022-11-29 DIAGNOSIS — G4700 Insomnia, unspecified: Secondary | ICD-10-CM | POA: Diagnosis not present

## 2022-11-29 DIAGNOSIS — I10 Essential (primary) hypertension: Secondary | ICD-10-CM | POA: Diagnosis not present

## 2022-11-29 DIAGNOSIS — E538 Deficiency of other specified B group vitamins: Secondary | ICD-10-CM | POA: Diagnosis not present

## 2022-12-07 DIAGNOSIS — G250 Essential tremor: Secondary | ICD-10-CM | POA: Diagnosis not present

## 2022-12-07 DIAGNOSIS — N184 Chronic kidney disease, stage 4 (severe): Secondary | ICD-10-CM | POA: Diagnosis not present

## 2022-12-07 DIAGNOSIS — E538 Deficiency of other specified B group vitamins: Secondary | ICD-10-CM | POA: Diagnosis not present

## 2022-12-07 DIAGNOSIS — I10 Essential (primary) hypertension: Secondary | ICD-10-CM | POA: Diagnosis not present

## 2023-01-07 DIAGNOSIS — I10 Essential (primary) hypertension: Secondary | ICD-10-CM | POA: Diagnosis not present

## 2023-01-07 DIAGNOSIS — N1831 Chronic kidney disease, stage 3a: Secondary | ICD-10-CM | POA: Diagnosis not present

## 2023-02-06 DIAGNOSIS — N1831 Chronic kidney disease, stage 3a: Secondary | ICD-10-CM | POA: Diagnosis not present

## 2023-02-06 DIAGNOSIS — I10 Essential (primary) hypertension: Secondary | ICD-10-CM | POA: Diagnosis not present

## 2023-03-09 DIAGNOSIS — I10 Essential (primary) hypertension: Secondary | ICD-10-CM | POA: Diagnosis not present

## 2023-03-09 DIAGNOSIS — N1831 Chronic kidney disease, stage 3a: Secondary | ICD-10-CM | POA: Diagnosis not present

## 2023-03-27 DIAGNOSIS — I10 Essential (primary) hypertension: Secondary | ICD-10-CM | POA: Diagnosis not present

## 2023-03-27 DIAGNOSIS — G250 Essential tremor: Secondary | ICD-10-CM | POA: Diagnosis not present

## 2023-03-27 DIAGNOSIS — N184 Chronic kidney disease, stage 4 (severe): Secondary | ICD-10-CM | POA: Diagnosis not present

## 2023-04-27 DIAGNOSIS — I10 Essential (primary) hypertension: Secondary | ICD-10-CM | POA: Diagnosis not present

## 2023-04-27 DIAGNOSIS — N1831 Chronic kidney disease, stage 3a: Secondary | ICD-10-CM | POA: Diagnosis not present

## 2023-05-28 DIAGNOSIS — N1831 Chronic kidney disease, stage 3a: Secondary | ICD-10-CM | POA: Diagnosis not present

## 2023-05-28 DIAGNOSIS — I10 Essential (primary) hypertension: Secondary | ICD-10-CM | POA: Diagnosis not present

## 2023-06-25 DIAGNOSIS — I10 Essential (primary) hypertension: Secondary | ICD-10-CM | POA: Diagnosis not present

## 2023-06-25 DIAGNOSIS — N1831 Chronic kidney disease, stage 3a: Secondary | ICD-10-CM | POA: Diagnosis not present

## 2023-07-26 DIAGNOSIS — N1831 Chronic kidney disease, stage 3a: Secondary | ICD-10-CM | POA: Diagnosis not present

## 2023-07-26 DIAGNOSIS — I10 Essential (primary) hypertension: Secondary | ICD-10-CM | POA: Diagnosis not present

## 2023-08-25 DIAGNOSIS — N1831 Chronic kidney disease, stage 3a: Secondary | ICD-10-CM | POA: Diagnosis not present

## 2023-08-25 DIAGNOSIS — I10 Essential (primary) hypertension: Secondary | ICD-10-CM | POA: Diagnosis not present

## 2023-09-24 DIAGNOSIS — N184 Chronic kidney disease, stage 4 (severe): Secondary | ICD-10-CM | POA: Diagnosis not present

## 2023-09-24 DIAGNOSIS — G4700 Insomnia, unspecified: Secondary | ICD-10-CM | POA: Diagnosis not present

## 2023-09-24 DIAGNOSIS — E538 Deficiency of other specified B group vitamins: Secondary | ICD-10-CM | POA: Diagnosis not present

## 2023-09-24 DIAGNOSIS — Z0001 Encounter for general adult medical examination with abnormal findings: Secondary | ICD-10-CM | POA: Diagnosis not present

## 2023-09-24 DIAGNOSIS — R7303 Prediabetes: Secondary | ICD-10-CM | POA: Diagnosis not present

## 2023-09-24 DIAGNOSIS — Z1389 Encounter for screening for other disorder: Secondary | ICD-10-CM | POA: Diagnosis not present

## 2023-09-24 DIAGNOSIS — Z1331 Encounter for screening for depression: Secondary | ICD-10-CM | POA: Diagnosis not present

## 2023-09-24 DIAGNOSIS — I10 Essential (primary) hypertension: Secondary | ICD-10-CM | POA: Diagnosis not present

## 2023-09-24 DIAGNOSIS — G250 Essential tremor: Secondary | ICD-10-CM | POA: Diagnosis not present

## 2023-10-24 DIAGNOSIS — I10 Essential (primary) hypertension: Secondary | ICD-10-CM | POA: Diagnosis not present

## 2023-10-24 DIAGNOSIS — N1831 Chronic kidney disease, stage 3a: Secondary | ICD-10-CM | POA: Diagnosis not present

## 2023-11-24 DIAGNOSIS — I10 Essential (primary) hypertension: Secondary | ICD-10-CM | POA: Diagnosis not present

## 2023-11-24 DIAGNOSIS — N1831 Chronic kidney disease, stage 3a: Secondary | ICD-10-CM | POA: Diagnosis not present

## 2023-12-25 DIAGNOSIS — I10 Essential (primary) hypertension: Secondary | ICD-10-CM | POA: Diagnosis not present

## 2023-12-25 DIAGNOSIS — N1831 Chronic kidney disease, stage 3a: Secondary | ICD-10-CM | POA: Diagnosis not present

## 2024-01-25 DIAGNOSIS — N1831 Chronic kidney disease, stage 3a: Secondary | ICD-10-CM | POA: Diagnosis not present

## 2024-01-25 DIAGNOSIS — I10 Essential (primary) hypertension: Secondary | ICD-10-CM | POA: Diagnosis not present

## 2024-02-25 DIAGNOSIS — N1831 Chronic kidney disease, stage 3a: Secondary | ICD-10-CM | POA: Diagnosis not present

## 2024-02-25 DIAGNOSIS — I10 Essential (primary) hypertension: Secondary | ICD-10-CM | POA: Diagnosis not present

## 2024-02-26 ENCOUNTER — Other Ambulatory Visit (HOSPITAL_COMMUNITY): Payer: Self-pay | Admitting: Internal Medicine

## 2024-02-26 DIAGNOSIS — Z1231 Encounter for screening mammogram for malignant neoplasm of breast: Secondary | ICD-10-CM

## 2024-02-27 ENCOUNTER — Ambulatory Visit (HOSPITAL_COMMUNITY)
Admission: RE | Admit: 2024-02-27 | Discharge: 2024-02-27 | Disposition: A | Discharge status: Home / Self Care | Source: Ambulatory Visit | Attending: Internal Medicine | Admitting: Internal Medicine

## 2024-02-27 DIAGNOSIS — Z1231 Encounter for screening mammogram for malignant neoplasm of breast: Secondary | ICD-10-CM | POA: Insufficient documentation

## 2024-02-27 DIAGNOSIS — R7303 Prediabetes: Secondary | ICD-10-CM | POA: Diagnosis not present

## 2024-02-27 DIAGNOSIS — E538 Deficiency of other specified B group vitamins: Secondary | ICD-10-CM | POA: Diagnosis not present

## 2024-02-27 DIAGNOSIS — I10 Essential (primary) hypertension: Secondary | ICD-10-CM | POA: Diagnosis not present

## 2024-02-27 DIAGNOSIS — N184 Chronic kidney disease, stage 4 (severe): Secondary | ICD-10-CM | POA: Diagnosis not present

## 2024-03-25 DIAGNOSIS — I10 Essential (primary) hypertension: Secondary | ICD-10-CM | POA: Diagnosis not present

## 2024-03-25 DIAGNOSIS — G4700 Insomnia, unspecified: Secondary | ICD-10-CM | POA: Diagnosis not present

## 2024-03-25 DIAGNOSIS — E538 Deficiency of other specified B group vitamins: Secondary | ICD-10-CM | POA: Diagnosis not present

## 2024-03-25 DIAGNOSIS — N184 Chronic kidney disease, stage 4 (severe): Secondary | ICD-10-CM | POA: Diagnosis not present

## 2024-03-25 DIAGNOSIS — R7303 Prediabetes: Secondary | ICD-10-CM | POA: Diagnosis not present
